# Patient Record
Sex: Female | Born: 1960 | Race: Black or African American | Hispanic: No | Marital: Married | Smoking: Former smoker
Health system: Southern US, Community
[De-identification: ages and names within clinical notes are randomized; demographics above are authoritative.]

## PROBLEM LIST (undated history)

## (undated) DIAGNOSIS — T7840XA Allergy, unspecified, initial encounter: Secondary | ICD-10-CM

## (undated) DIAGNOSIS — R7303 Prediabetes: Secondary | ICD-10-CM

## (undated) DIAGNOSIS — R5383 Other fatigue: Secondary | ICD-10-CM

## (undated) DIAGNOSIS — Z923 Personal history of irradiation: Secondary | ICD-10-CM

## (undated) DIAGNOSIS — Z789 Other specified health status: Secondary | ICD-10-CM

## (undated) DIAGNOSIS — M549 Dorsalgia, unspecified: Secondary | ICD-10-CM

## (undated) DIAGNOSIS — E119 Type 2 diabetes mellitus without complications: Secondary | ICD-10-CM

## (undated) DIAGNOSIS — E559 Vitamin D deficiency, unspecified: Secondary | ICD-10-CM

## (undated) DIAGNOSIS — C801 Malignant (primary) neoplasm, unspecified: Secondary | ICD-10-CM

## (undated) HISTORY — DX: Vitamin D deficiency, unspecified: E55.9

## (undated) HISTORY — DX: Prediabetes: R73.03

## (undated) HISTORY — DX: Allergy, unspecified, initial encounter: T78.40XA

## (undated) HISTORY — PX: POLYPECTOMY: SHX149

## (undated) HISTORY — DX: Dorsalgia, unspecified: M54.9

## (undated) HISTORY — DX: Type 2 diabetes mellitus without complications: E11.9

## (undated) HISTORY — PX: BREAST LUMPECTOMY: SHX2

## (undated) HISTORY — DX: Other fatigue: R53.83

## (undated) HISTORY — PX: WISDOM TOOTH EXTRACTION: SHX21

---
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
## 2001-09-22 ENCOUNTER — Other Ambulatory Visit: Admission: RE | Admit: 2001-09-22 | Discharge: 2001-09-22 | Payer: Self-pay | Admitting: Family Medicine

## 2002-09-27 ENCOUNTER — Encounter: Admission: RE | Admit: 2002-09-27 | Discharge: 2002-12-26 | Payer: Self-pay | Admitting: Family Medicine

## 2006-11-26 ENCOUNTER — Encounter: Admission: RE | Admit: 2006-11-26 | Discharge: 2006-11-26 | Payer: Self-pay | Admitting: Occupational Medicine

## 2007-01-05 ENCOUNTER — Encounter: Admission: RE | Admit: 2007-01-05 | Discharge: 2007-04-05 | Payer: Self-pay | Admitting: Occupational Medicine

## 2007-02-03 ENCOUNTER — Encounter: Admission: RE | Admit: 2007-02-03 | Discharge: 2007-02-03 | Payer: Self-pay | Admitting: Family Medicine

## 2007-02-09 ENCOUNTER — Other Ambulatory Visit: Admission: RE | Admit: 2007-02-09 | Discharge: 2007-02-09 | Payer: Self-pay | Admitting: Family Medicine

## 2008-02-07 ENCOUNTER — Encounter: Admission: RE | Admit: 2008-02-07 | Discharge: 2008-02-07 | Payer: Self-pay | Admitting: Internal Medicine

## 2008-02-11 ENCOUNTER — Other Ambulatory Visit: Admission: RE | Admit: 2008-02-11 | Discharge: 2008-02-11 | Payer: Self-pay | Admitting: Family Medicine

## 2009-03-01 ENCOUNTER — Encounter: Admission: RE | Admit: 2009-03-01 | Discharge: 2009-03-01 | Payer: Self-pay | Admitting: Internal Medicine

## 2009-03-22 ENCOUNTER — Other Ambulatory Visit: Admission: RE | Admit: 2009-03-22 | Discharge: 2009-03-22 | Payer: Self-pay | Admitting: Family Medicine

## 2009-11-06 ENCOUNTER — Encounter: Admission: RE | Admit: 2009-11-06 | Discharge: 2009-12-28 | Payer: Self-pay | Admitting: Occupational Medicine

## 2010-03-12 ENCOUNTER — Encounter: Admission: RE | Admit: 2010-03-12 | Discharge: 2010-03-12 | Payer: Self-pay | Admitting: Family Medicine

## 2010-03-20 ENCOUNTER — Encounter: Admission: RE | Admit: 2010-03-20 | Discharge: 2010-03-20 | Payer: Self-pay | Admitting: Family Medicine

## 2011-02-19 ENCOUNTER — Other Ambulatory Visit: Payer: Self-pay | Admitting: Internal Medicine

## 2011-02-19 DIAGNOSIS — Z1231 Encounter for screening mammogram for malignant neoplasm of breast: Secondary | ICD-10-CM

## 2011-03-21 ENCOUNTER — Ambulatory Visit
Admission: RE | Admit: 2011-03-21 | Discharge: 2011-03-21 | Disposition: A | Discharge status: Home / Self Care | Payer: Commercial Managed Care - PPO | Source: Ambulatory Visit | Attending: Internal Medicine | Admitting: Internal Medicine

## 2011-03-21 DIAGNOSIS — Z1231 Encounter for screening mammogram for malignant neoplasm of breast: Secondary | ICD-10-CM

## 2011-04-07 ENCOUNTER — Other Ambulatory Visit: Payer: Self-pay | Admitting: Physician Assistant

## 2011-04-07 ENCOUNTER — Other Ambulatory Visit (HOSPITAL_COMMUNITY)
Admission: RE | Admit: 2011-04-07 | Discharge: 2011-04-07 | Disposition: A | Discharge status: Home / Self Care | Payer: 59 | Source: Ambulatory Visit | Attending: Family Medicine | Admitting: Family Medicine

## 2011-04-07 DIAGNOSIS — Z01419 Encounter for gynecological examination (general) (routine) without abnormal findings: Secondary | ICD-10-CM | POA: Insufficient documentation

## 2012-04-06 ENCOUNTER — Other Ambulatory Visit: Payer: Self-pay | Admitting: Internal Medicine

## 2012-04-06 DIAGNOSIS — Z1231 Encounter for screening mammogram for malignant neoplasm of breast: Secondary | ICD-10-CM

## 2012-04-21 ENCOUNTER — Ambulatory Visit
Admission: RE | Admit: 2012-04-21 | Discharge: 2012-04-21 | Disposition: A | Discharge status: Home / Self Care | Payer: 59 | Source: Ambulatory Visit | Attending: Internal Medicine | Admitting: Internal Medicine

## 2012-04-21 DIAGNOSIS — Z1231 Encounter for screening mammogram for malignant neoplasm of breast: Secondary | ICD-10-CM

## 2013-03-31 ENCOUNTER — Other Ambulatory Visit: Payer: Self-pay

## 2013-03-31 DIAGNOSIS — Z1231 Encounter for screening mammogram for malignant neoplasm of breast: Secondary | ICD-10-CM

## 2013-04-22 ENCOUNTER — Ambulatory Visit: Payer: 59

## 2013-05-04 ENCOUNTER — Ambulatory Visit: Payer: 59

## 2013-05-05 ENCOUNTER — Ambulatory Visit
Admission: RE | Admit: 2013-05-05 | Discharge: 2013-05-05 | Disposition: A | Discharge status: Home / Self Care | Payer: 59 | Source: Ambulatory Visit

## 2013-05-05 DIAGNOSIS — Z1231 Encounter for screening mammogram for malignant neoplasm of breast: Secondary | ICD-10-CM

## 2013-10-10 ENCOUNTER — Other Ambulatory Visit (HOSPITAL_COMMUNITY)
Admission: RE | Admit: 2013-10-10 | Discharge: 2013-10-10 | Disposition: A | Discharge status: Home / Self Care | Payer: 59 | Source: Ambulatory Visit | Attending: Family Medicine | Admitting: Family Medicine

## 2013-10-10 ENCOUNTER — Other Ambulatory Visit: Payer: Self-pay | Admitting: Physician Assistant

## 2013-10-10 DIAGNOSIS — Z124 Encounter for screening for malignant neoplasm of cervix: Secondary | ICD-10-CM | POA: Insufficient documentation

## 2014-04-06 ENCOUNTER — Other Ambulatory Visit: Payer: Self-pay

## 2014-04-06 DIAGNOSIS — Z1231 Encounter for screening mammogram for malignant neoplasm of breast: Secondary | ICD-10-CM

## 2014-05-08 ENCOUNTER — Ambulatory Visit: Payer: 59

## 2014-05-15 ENCOUNTER — Encounter (INDEPENDENT_AMBULATORY_CARE_PROVIDER_SITE_OTHER): Payer: Self-pay

## 2014-05-15 ENCOUNTER — Ambulatory Visit
Admission: RE | Admit: 2014-05-15 | Discharge: 2014-05-15 | Disposition: A | Discharge status: Home / Self Care | Payer: 59 | Source: Ambulatory Visit

## 2014-05-15 DIAGNOSIS — Z1231 Encounter for screening mammogram for malignant neoplasm of breast: Secondary | ICD-10-CM

## 2014-06-04 ENCOUNTER — Ambulatory Visit (INDEPENDENT_AMBULATORY_CARE_PROVIDER_SITE_OTHER): Payer: 59 | Admitting: Physician Assistant

## 2014-06-04 VITALS — BP 114/80 | HR 82 | Temp 98.1°F | Resp 18 | Ht 63.25 in | Wt 187.0 lb

## 2014-06-04 DIAGNOSIS — R05 Cough: Secondary | ICD-10-CM

## 2014-06-04 DIAGNOSIS — J40 Bronchitis, not specified as acute or chronic: Secondary | ICD-10-CM

## 2014-06-04 DIAGNOSIS — R059 Cough, unspecified: Secondary | ICD-10-CM

## 2014-06-04 DIAGNOSIS — J029 Acute pharyngitis, unspecified: Secondary | ICD-10-CM

## 2014-06-04 LAB — POCT RAPID STREP A (OFFICE): RAPID STREP A SCREEN: NEGATIVE

## 2014-06-04 MED ORDER — HYDROCOD POLST-CHLORPHEN POLST 10-8 MG/5ML PO LQCR: 5.0000 mL | Freq: Two times a day (BID) | ORAL | Status: DC | PRN

## 2014-06-04 MED ORDER — AZITHROMYCIN 250 MG PO TABS: ORAL_TABLET | ORAL | Status: DC

## 2014-06-04 NOTE — Progress Notes (Signed)
Subjective:    Patient ID: Ashley Clark, female    DOB: 18-Mar-1961, 53 y.o.   MRN: [id number]  HPI Primary Physician: No primary provider on file.  Chief Complaint: Cough x 5 days and sore throat x 2 days  HPI: 53 y.o. female with history below presents with 5 day history of cough and sore throat x 2 days. Some post nasal drip. Cough is productive of light yellow sputum and keeping the patient awake at nighttime. No SOB or wheezing. Afebrile. No chills. History of seasonal allergies.   No known sick contacts.  She has tried Allegra-D, but none recently.  Smokes 1 pack every 3 days. Been ready to quit. Has tried cold [place] without success.    No past medical history on file.   Home Meds: Prior to Admission medications   Not on File    Allergies: No Known Allergies  History   Social History  . Marital Status: Married    Spouse Name: N/A    Number of Children: N/A  . Years of Education: N/A   Occupational History  . Not on file.   Social History Main Topics  . Smoking status: Current Every Day Smoker  . Smokeless tobacco: Not on file  . Alcohol Use: Not on file  . Drug Use: Not on file  . Sexual Activity: Not on file   Other Topics Concern  . Not on file   Social History Narrative  . No narrative on file     Review of Systems  Constitutional: Negative for fever, chills, appetite change and fatigue.  HENT: Positive for postnasal drip and sore throat. Negative for congestion, ear pain, hearing loss, rhinorrhea and sinus pressure.        Left ear wants to pop  Respiratory: Positive for cough. Negative for shortness of breath and wheezing.        Cough is sometimes productive of light yellow sputum. Cough is keeping her awake at nighttime.   Gastrointestinal: Negative for nausea, vomiting and diarrhea.  Musculoskeletal: Negative for myalgias.  Allergic/Immunologic: Positive for environmental allergies.       Seasonal allergies.   Neurological: Negative for  headaches.       Objective:   Physical Exam  Physical Exam: Blood pressure 114/80, pulse 82, temperature 98.1 F (36.7 C), temperature source Oral, resp. rate 18, height 5' 3.25" (1.607 m), weight 187 lb (84.823 kg), last menstrual period 04/27/2014, SpO2 96.00%., Body mass index is 32.85 kg/(m^2). General: Well developed, well nourished, in no acute distress. Head: Normocephalic, atraumatic, eyes without discharge, sclera non-icteric, nares are without discharge. Bilateral auditory canals clear, TM's are without perforation, pearly grey and translucent with reflective cone of light bilaterally. Oral cavity moist, posterior pharynx with post nasal drip. No exudate, erythema, or peritonsillar abscess. Uvula midline.   Neck: Supple. No thyromegaly. Full ROM. No lymphadenopathy. No nuchal rigidity.  Lungs: Clear bilaterally to auscultation without wheezes, rales, or rhonchi. Breathing is unlabored. Heart: RRR with S1 S2. No murmurs, rubs, or gallops appreciated. Msk:  Strength and tone normal for age. Extremities/Skin: Warm and dry. No clubbing or cyanosis. No edema. No rashes or suspicious lesions. Neuro: Alert and oriented X 3. Moves all extremities spontaneously. Gait is normal. CNII-XII grossly in tact. Psych:  Responds to questions appropriately with a normal affect.    Labs: Results for orders placed in visit on 06/04/14  POCT RAPID STREP A (OFFICE)      Result Value Ref Range   Rapid  Strep A Screen Negative  Negative   Throat culture pending    Assessment & Plan:  53 year old female with bronchitis and cough -Azithromycin 250 MG #6 2 po first day then 1 po next 4 days no RF -Tussionex 1 tsp po q 12 hours prn cough #90 mL no RF, SED -Rest/fluids -RTC precautions   Christell Faith, MHS, PA-C [facility] [place], [place] [address] [place] 06/04/2014 9:16 AM

## 2014-06-06 LAB — CULTURE, GROUP A STREP: ORGANISM ID, BACTERIA: NORMAL

## 2014-11-10 ENCOUNTER — Other Ambulatory Visit: Payer: Self-pay | Admitting: Physician Assistant

## 2014-11-10 ENCOUNTER — Other Ambulatory Visit (HOSPITAL_COMMUNITY)
Admission: RE | Admit: 2014-11-10 | Discharge: 2014-11-10 | Disposition: A | Discharge status: Home / Self Care | Payer: 59 | Source: Ambulatory Visit | Attending: Family Medicine | Admitting: Family Medicine

## 2014-11-10 DIAGNOSIS — Z01419 Encounter for gynecological examination (general) (routine) without abnormal findings: Secondary | ICD-10-CM | POA: Insufficient documentation

## 2014-11-14 LAB — CYTOLOGY - PAP

## 2014-12-29 HISTORY — PX: COLONOSCOPY: SHX174

## 2015-01-23 ENCOUNTER — Encounter: Payer: Self-pay | Admitting: Gastroenterology

## 2015-03-26 ENCOUNTER — Ambulatory Visit (AMBULATORY_SURGERY_CENTER): Payer: Self-pay | Admitting: *Deleted

## 2015-03-26 VITALS — Ht 61.5 in | Wt 168.2 lb

## 2015-03-26 DIAGNOSIS — Z1211 Encounter for screening for malignant neoplasm of colon: Secondary | ICD-10-CM

## 2015-03-26 MED ORDER — MOVIPREP 100 G PO SOLR: 1.0000 | Freq: Once | ORAL | Status: DC

## 2015-03-26 NOTE — Progress Notes (Signed)
No egg or soy allergy No past sedation. No diet pills  no home 02 use emmi video declined

## 2015-04-06 ENCOUNTER — Encounter: Payer: Self-pay | Admitting: Gastroenterology

## 2015-04-06 ENCOUNTER — Ambulatory Visit (AMBULATORY_SURGERY_CENTER): Payer: 59 | Admitting: Gastroenterology

## 2015-04-06 VITALS — BP 128/76 | HR 65 | Temp 98.3°F | Resp 18 | Ht 61.5 in | Wt 168.0 lb

## 2015-04-06 DIAGNOSIS — Z1211 Encounter for screening for malignant neoplasm of colon: Secondary | ICD-10-CM | POA: Diagnosis not present

## 2015-04-06 DIAGNOSIS — D122 Benign neoplasm of ascending colon: Secondary | ICD-10-CM | POA: Diagnosis not present

## 2015-04-06 MED ORDER — SODIUM CHLORIDE 0.9 % IV SOLN: 500.0000 mL | INTRAVENOUS | Status: DC

## 2015-04-06 NOTE — Patient Instructions (Signed)
Impressions/recommendations:  Polyp (handout given)  Repeat colonoscopy pending pathology results.  YOU HAD AN ENDOSCOPIC PROCEDURE TODAY AT THE  ENDOSCOPY CENTER:   Refer to the procedure report that was given to you for any specific questions about what was found during the examination.  If the procedure report does not answer your questions, please call your gastroenterologist to clarify.  If you requested that your care partner not be given the details of your procedure findings, then the procedure report has been included in a sealed envelope for you to review at your convenience later.  YOU SHOULD EXPECT: Some feelings of bloating in the abdomen. Passage of more gas than usual.  Walking can help get rid of the air that was put into your GI tract during the procedure and reduce the bloating. If you had a lower endoscopy (such as a colonoscopy or flexible sigmoidoscopy) you may notice spotting of blood in your stool or on the toilet paper. If you underwent a bowel prep for your procedure, you may not have a normal bowel movement for a few days.  Please Note:  You might notice some irritation and congestion in your nose or some drainage.  This is from the oxygen used during your procedure.  There is no need for concern and it should clear up in a day or so.  SYMPTOMS TO REPORT IMMEDIATELY:   Following lower endoscopy (colonoscopy or flexible sigmoidoscopy):  Excessive amounts of blood in the stool  Significant tenderness or worsening of abdominal pains  Swelling of the abdomen that is new, acute  Fever of 100F or higher   For urgent or emergent issues, a gastroenterologist can be reached at any hour by calling [phone].   DIET: Your first meal following the procedure should be a small meal and then it is ok to progress to your normal diet. Heavy or fried foods are harder to digest and may make you feel nauseous or bloated.  Likewise, meals heavy in dairy and vegetables can  increase bloating.  Drink plenty of fluids but you should avoid alcoholic beverages for 24 hours.  ACTIVITY:  You should plan to take it easy for the rest of today and you should NOT DRIVE or use heavy machinery until tomorrow (because of the sedation medicines used during the test).    FOLLOW UP: Our staff will call the number listed on your records the next business day following your procedure to check on you and address any questions or concerns that you may have regarding the information given to you following your procedure. If we do not reach you, we will leave a message.  However, if you are feeling well and you are not experiencing any problems, there is no need to return our call.  We will assume that you have returned to your regular daily activities without incident.  If any biopsies were taken you will be contacted by phone or by letter within the next 1-3 weeks.  Please call us at [phone] if you have not heard about the biopsies in 3 weeks.    SIGNATURES/CONFIDENTIALITY: You and/or your care partner have signed paperwork which will be entered into your electronic medical record.  These signatures attest to the fact that that the information above on your After Visit Summary has been reviewed and is understood.  Full responsibility of the confidentiality of this discharge information lies with you and/or your care-partner. 

## 2015-04-06 NOTE — Progress Notes (Signed)
Called to room to assist during endoscopic procedure.  Patient ID and intended procedure confirmed with present staff. Received instructions for my participation in the procedure from the performing physician.  

## 2015-04-06 NOTE — Op Note (Signed)
[place]  [address] [place], [id number]   COLONOSCOPY PROCEDURE REPORT  PATIENT: Ashley Clark, Ashley Clark  MR#: [id number] BIRTHDATE: 1961-01-18 , 66  yrs. old GENDER: female ENDOSCOPIST: Milus Banister, MD REFERRED FX:[id number] [name], P.A. PROCEDURE DATE:  04/06/2015 PROCEDURE:   Colonoscopy, screening and Colonoscopy with snare polypectomy First Screening Colonoscopy - Avg.  risk and is 50 yrs.  old or older Yes.  Prior Negative Screening - Now for repeat screening. N/A  History of Adenoma - Now for follow-up colonoscopy & has been > or = to 3 yrs.  N/A ASA CLASS:   Class II INDICATIONS:Screening for colonic neoplasia and Colorectal Neoplasm Risk Assessment for this procedure is average risk. MEDICATIONS: Monitored anesthesia care and Propofol 250 mg IV  DESCRIPTION OF PROCEDURE:   After the risks benefits and alternatives of the procedure were thoroughly explained, informed consent was obtained.  The digital rectal exam revealed no abnormalities of the rectum.   The LB [id number]  endoscope was introduced through the anus and advanced to the cecum, which was identified by both the appendix and ileocecal valve. No adverse events experienced.   The quality of the prep was excellent.  The instrument was then slowly withdrawn as the colon was fully examined.   COLON FINDINGS: A sessile polyp measuring 8 mm in size was found in the ascending colon.  A polypectomy was performed using snare cautery.  The resection was complete, the polyp tissue was completely retrieved and sent to histology.  Retroflexed views revealed no abnormalities. The time to cecum = 1.5 Withdrawal time = 9.7   The scope was withdrawn and the procedure completed. COMPLICATIONS: There were no immediate complications.  ENDOSCOPIC IMPRESSION: Sessile polyp was found in the ascending colon; polypectomy was performed using snare cautery  RECOMMENDATIONS: If the polyp(s) removed today  are proven to be adenomatous (pre-cancerous) polyps, you will need a repeat colonoscopy in 5 years.  Otherwise you should continue to follow colorectal cancer screening guidelines for "routine risk" patients with colonoscopy in 10 years.  You will receive a letter within 1-2 weeks with the results of your biopsy as well as final recommendations.  Please call my office if you have not received a letter after 3 weeks.  eSigned:  Milus Banister, MD 04/06/2015 10:38 AM

## 2015-04-06 NOTE — Progress Notes (Signed)
A/ox3 pleased with MAC, report to Tracy RN 

## 2015-04-09 ENCOUNTER — Telehealth: Payer: Self-pay | Admitting: *Deleted

## 2015-04-09 NOTE — Telephone Encounter (Signed)
  Follow up Call-  Call back number 04/06/2015  Post procedure Call Back phone  # [phone]  Permission to leave phone message Yes   [facility]

## 2015-04-11 ENCOUNTER — Other Ambulatory Visit: Payer: Self-pay

## 2015-04-11 DIAGNOSIS — Z1231 Encounter for screening mammogram for malignant neoplasm of breast: Secondary | ICD-10-CM

## 2015-04-13 ENCOUNTER — Encounter: Payer: Self-pay | Admitting: Gastroenterology

## 2015-05-18 ENCOUNTER — Encounter (INDEPENDENT_AMBULATORY_CARE_PROVIDER_SITE_OTHER): Payer: Self-pay

## 2015-05-18 ENCOUNTER — Ambulatory Visit
Admission: RE | Admit: 2015-05-18 | Discharge: 2015-05-18 | Disposition: A | Discharge status: Home / Self Care | Payer: 59 | Source: Ambulatory Visit

## 2015-05-18 DIAGNOSIS — Z1231 Encounter for screening mammogram for malignant neoplasm of breast: Secondary | ICD-10-CM

## 2016-01-24 DIAGNOSIS — M545 Low back pain: Secondary | ICD-10-CM | POA: Diagnosis not present

## 2016-01-24 DIAGNOSIS — M25531 Pain in right wrist: Secondary | ICD-10-CM | POA: Diagnosis not present

## 2016-01-24 DIAGNOSIS — M9902 Segmental and somatic dysfunction of thoracic region: Secondary | ICD-10-CM | POA: Diagnosis not present

## 2016-01-24 DIAGNOSIS — M9901 Segmental and somatic dysfunction of cervical region: Secondary | ICD-10-CM | POA: Diagnosis not present

## 2016-02-11 MED FILL — MUCINEX ER 600 MG TABLET: 600 | 15 days supply | Qty: 30 | Fill #0

## 2016-02-11 MED FILL — FLUTICASONE PROP 50 MCG SPR: 50 | 30 days supply | Qty: 16 | Fill #0

## 2016-05-01 ENCOUNTER — Other Ambulatory Visit: Payer: Self-pay

## 2016-05-01 DIAGNOSIS — Z1231 Encounter for screening mammogram for malignant neoplasm of breast: Secondary | ICD-10-CM

## 2016-05-12 DIAGNOSIS — Z720 Tobacco use: Secondary | ICD-10-CM | POA: Diagnosis not present

## 2016-05-12 DIAGNOSIS — Z87891 Personal history of nicotine dependence: Secondary | ICD-10-CM | POA: Diagnosis not present

## 2016-05-19 ENCOUNTER — Ambulatory Visit
Admission: RE | Admit: 2016-05-19 | Discharge: 2016-05-19 | Disposition: A | Discharge status: Home / Self Care | Payer: 59 | Source: Ambulatory Visit

## 2016-05-19 DIAGNOSIS — Z1231 Encounter for screening mammogram for malignant neoplasm of breast: Secondary | ICD-10-CM | POA: Diagnosis not present

## 2016-05-22 ENCOUNTER — Other Ambulatory Visit (HOSPITAL_COMMUNITY)
Admission: RE | Admit: 2016-05-22 | Discharge: 2016-05-22 | Disposition: A | Discharge status: Home / Self Care | Payer: 59 | Source: Ambulatory Visit | Attending: Family Medicine | Admitting: Family Medicine

## 2016-05-22 ENCOUNTER — Other Ambulatory Visit: Payer: Self-pay | Admitting: Physician Assistant

## 2016-05-22 DIAGNOSIS — Z124 Encounter for screening for malignant neoplasm of cervix: Secondary | ICD-10-CM | POA: Diagnosis not present

## 2016-05-22 DIAGNOSIS — Z23 Encounter for immunization: Secondary | ICD-10-CM | POA: Diagnosis not present

## 2016-05-22 DIAGNOSIS — Z0000 Encounter for general adult medical examination without abnormal findings: Secondary | ICD-10-CM | POA: Diagnosis not present

## 2016-05-27 LAB — CYTOLOGY - PAP

## 2016-06-30 DIAGNOSIS — L239 Allergic contact dermatitis, unspecified cause: Secondary | ICD-10-CM | POA: Diagnosis not present

## 2017-03-12 DIAGNOSIS — H524 Presbyopia: Secondary | ICD-10-CM | POA: Diagnosis not present

## 2017-05-26 DIAGNOSIS — Z131 Encounter for screening for diabetes mellitus: Secondary | ICD-10-CM | POA: Diagnosis not present

## 2017-05-26 DIAGNOSIS — Z0000 Encounter for general adult medical examination without abnormal findings: Secondary | ICD-10-CM | POA: Diagnosis not present

## 2017-05-26 DIAGNOSIS — Z13220 Encounter for screening for lipoid disorders: Secondary | ICD-10-CM | POA: Diagnosis not present

## 2017-06-18 ENCOUNTER — Other Ambulatory Visit: Payer: Self-pay | Admitting: Physician Assistant

## 2017-06-18 DIAGNOSIS — Z1231 Encounter for screening mammogram for malignant neoplasm of breast: Secondary | ICD-10-CM

## 2017-07-07 ENCOUNTER — Ambulatory Visit: Payer: 59

## 2017-07-22 ENCOUNTER — Ambulatory Visit
Admission: RE | Admit: 2017-07-22 | Discharge: 2017-07-22 | Disposition: A | Discharge status: Home / Self Care | Payer: 59 | Source: Ambulatory Visit | Attending: Physician Assistant | Admitting: Physician Assistant

## 2017-07-22 DIAGNOSIS — Z1231 Encounter for screening mammogram for malignant neoplasm of breast: Secondary | ICD-10-CM

## 2017-12-29 DIAGNOSIS — C801 Malignant (primary) neoplasm, unspecified: Secondary | ICD-10-CM

## 2017-12-29 HISTORY — DX: Malignant (primary) neoplasm, unspecified: C80.1

## 2018-03-23 ENCOUNTER — Encounter: Payer: Self-pay | Admitting: Gastroenterology

## 2018-04-09 MED FILL — FLUTICASONE PROP 50 MCG SPR: 50 | 30 days supply | Qty: 16 | Fill #0

## 2018-04-09 MED FILL — CETIRIZINE HCL 10 MG TABS: 10 | 30 days supply | Qty: 30 | Fill #0

## 2018-06-02 ENCOUNTER — Encounter: Payer: Self-pay | Admitting: Gastroenterology

## 2018-06-15 ENCOUNTER — Other Ambulatory Visit: Payer: Self-pay | Admitting: Physician Assistant

## 2018-06-15 DIAGNOSIS — Z1231 Encounter for screening mammogram for malignant neoplasm of breast: Secondary | ICD-10-CM

## 2018-07-27 ENCOUNTER — Ambulatory Visit
Admission: RE | Admit: 2018-07-27 | Discharge: 2018-07-27 | Disposition: A | Discharge status: Home / Self Care | Payer: No Typology Code available for payment source | Source: Ambulatory Visit | Attending: Physician Assistant | Admitting: Physician Assistant

## 2018-07-27 DIAGNOSIS — Z1231 Encounter for screening mammogram for malignant neoplasm of breast: Secondary | ICD-10-CM

## 2018-07-28 ENCOUNTER — Other Ambulatory Visit: Payer: Self-pay | Admitting: Physician Assistant

## 2018-07-28 DIAGNOSIS — R928 Other abnormal and inconclusive findings on diagnostic imaging of breast: Secondary | ICD-10-CM

## 2018-08-02 ENCOUNTER — Other Ambulatory Visit: Payer: Self-pay | Admitting: Physician Assistant

## 2018-08-02 ENCOUNTER — Ambulatory Visit
Admission: RE | Admit: 2018-08-02 | Discharge: 2018-08-02 | Disposition: A | Discharge status: Home / Self Care | Payer: No Typology Code available for payment source | Source: Ambulatory Visit | Attending: Physician Assistant | Admitting: Physician Assistant

## 2018-08-02 DIAGNOSIS — R928 Other abnormal and inconclusive findings on diagnostic imaging of breast: Secondary | ICD-10-CM

## 2018-08-02 DIAGNOSIS — R599 Enlarged lymph nodes, unspecified: Secondary | ICD-10-CM

## 2018-08-02 DIAGNOSIS — N6320 Unspecified lump in the left breast, unspecified quadrant: Secondary | ICD-10-CM

## 2018-08-04 ENCOUNTER — Other Ambulatory Visit: Payer: Self-pay | Admitting: Physician Assistant

## 2018-08-04 ENCOUNTER — Ambulatory Visit
Admission: RE | Admit: 2018-08-04 | Discharge: 2018-08-04 | Disposition: A | Discharge status: Home / Self Care | Payer: No Typology Code available for payment source | Source: Ambulatory Visit | Attending: Physician Assistant | Admitting: Physician Assistant

## 2018-08-04 DIAGNOSIS — R599 Enlarged lymph nodes, unspecified: Secondary | ICD-10-CM

## 2018-08-04 DIAGNOSIS — N6320 Unspecified lump in the left breast, unspecified quadrant: Secondary | ICD-10-CM

## 2018-08-04 HISTORY — PX: BREAST BIOPSY: SHX20

## 2018-08-09 ENCOUNTER — Ambulatory Visit: Payer: Self-pay | Admitting: Surgery

## 2018-08-09 NOTE — H&P (Signed)
Ashley Clark Documented: 08/09/2018 9:28 AM Location: [facility] Patient #: [id number] DOB: 03/25/1961 Married / Language: English / Race: Black or African American Female  History of Present Illness Marcello Moores A. Anesa Fronek MD; 08/09/2018 11:12 AM) Patient words: Pt sent at the request of Dr Jeanmarie Plant for abnormal screening mammogram. Pt found to have 2 adjacent masses 1 cm and 2 cm from nipple lower outer and inner quadrant. No history of pain discharge or mass.                             ADDITIONAL INFORMATION: 1. PROGNOSTIC INDICATORS Results: IMMUNOHISTOCHEMICAL AND MORPHOMETRIC ANALYSIS PERFORMED MANUALLY The tumor cells are Neagitive for Her2 (1+). Estrogen Receptor: 90%, POSITIVE, STRONG STAINING INTENSITY Progesterone Receptor: 95%, POSITIVE, STRONG STAINING INTENSITY Proliferation Marker Ki67: 20% REFERENCE RANGE ESTROGEN RECEPTOR NEGATIVE 0% POSITIVE =>1% REFERENCE RANGE PROGESTERONE RECEPTOR NEGATIVE 0% POSITIVE =>1% All controls stained appropriately Vicente Males MD Pathologist, Electronic Signature ( Signed 08/06/2018) FINAL DIAGNOSIS Diagnosis 1. 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 2. Breast, left, needle core biopsy, 9:30 o'clock, 1 cm fn (coil clip) - PSEUDOANGIOMATOUS STROMAL HYPERPLASIA ([place]). 1 of 3 FINAL for Ashley Clark, Ashley Clark ([id number]) Diagnosis(continued) - FIBROCYSTIC CHANGE AND CALCIFICATIONS. - NO MALIGNANCY IDENTIFIED. 3. Lymph node, needle/core biopsy, left axilla - BENIGN LYMPH NODE WITH DERMATOPATHIC CHANGE. - NO MALIGNANCY IDENTIFIED. Microscopic Comment 1. The carcinoma appears grade 2-3. Prognostic markers will be ordered. Dr. Lyndon Code has reviewed the case. The case was called to [facility] on 08/05/2018. Vicente Males MD Pathologist, Electronic Signature (Case signed  08/05/2018)    ADDENDUM REPORT: 08/04/2018 15:56 ADDENDUM: Correction to the laterality in the body of the original report: all imaging physical exam findings are in the left breast. Electronically Signed By: Ammie Ferrier M.D. On: 08/04/2018 15:56 Addended by Jamie Kato, MD on 08/04/2018 3:59 PM  Study Result CLINICAL DATA: Screening recall for a left breast possible mass. Patient has family history of breast cancer in her mother at age 73. EXAM: DIGITAL DIAGNOSTIC LEFT MAMMOGRAM WITH CAD AND TOMO ULTRASOUND LEFT BREAST COMPARISON: Previous exam(s). ACR Breast Density Category c: The breast tissue is heterogeneously dense, which may obscure small masses. FINDINGS: On the spot compression tomosynthesis images of the medial left breast there is a persistent subtle asymmetry without a correlate on the true lateral view. The abnormality appears more apparent on the screening full paddle CC image and appears to have some associated distortion. Mammographic images were processed with CAD. On physical exam, there is a subtle palpable lump in the upper inner quadrant of the right breast. Ultrasound of the right breast at 9:30, 2 cm from the nipple demonstrates an irregular hypoechoic vascular mass measuring 1.2 x 1.2 x 1.1 cm. Closer to the nipple at 9:30, 1 cm from the nipple there is a smaller irregular hypoechoic mass measuring 0.8 x 0.5 x 0.6 cm. The larger and the smaller mass together span 3.6 cm, and they lie approximately 1.5 cm apart. Ultrasound of the left axilla demonstrates 1 lymph node with a cortex measuring up to 6 mm. There are 2 other prominent lymph nodes with cortices measuring approximately 4 mm. IMPRESSION: 1. There are 2 adjacent masses in the left breast at 9:30, 1 and 2 cm from the nipple respectively which are suspicious for malignancy. 2. There is 1 lymph node  with cortex measuring 6 mm, and 2 other the borderline possibly abnormal lymph  nodes. RECOMMENDATION: Ultrasound guided biopsy is recommended for the 2 adjacent masses in the left breast at 9:30 and for the largest of the left axillary lymph nodes. 2. Following biopsy, MRI should be considered given the possible multifocality and the density of the patient's breast tissue. These procedures have been scheduled for 08/04/2018 at 1:45 p.m. I have discussed the findings and recommendations with the patient. Results were also provided in writing at the conclusion of the visit. If applicable, a reminder letter will be sent to the patient regarding the next appointment. BI-RADS CATEGORY 5: Highly suggestive of malignancy. Electronically Signed: By: Ammie Ferrier M.D. On: 08/02/2018 09:52  Result History  MM DIAG BREAST TOMO UNI LEFT (Order #[id number]) on 08/04/2018 - Order Result History Report - Result Edited <[email or web address]  MM 3D SCREEN BREAST BILATERAL (Order [id number]) Study Result  CLINICAL DATA: Screening. EXAM: DIGITAL SCREENING BILATERAL MAMMOGRAM WITH TOMO AND CAD COMPARISON: Previous exam(s). ACR Breast Density Category c: The breast tissue is heterogeneously dense, which may obscure small masses. FINDINGS: In the left breast, a possible mass warrants further evaluation. In the right breast, no findings suspicious for malignancy. Images were processed with CAD. IMPRESSION: Further evaluation is suggested for possible mass in the left breast. RECOMMENDATION: Diagnostic mammogram and possibly ultrasound of the left breast. (Code:[id number]) The patient will be contacted regarding the findings, and additional imaging will be scheduled. BI-RADS CATEGORY 0: Incomplete. Need additional imaging evaluation and/or prior mammograms for comparison.    Specimen Gross and Clinical Information.  The patient is a 57 year old female.   Past Surgical History Sabino Gasser; 08/09/2018 9:37 AM) No pertinent past surgical history  Diagnostic  Studies History Sabino Gasser; 08/09/2018 9:37 AM) Colonoscopy 1-5 years ago Mammogram within last year Pap Smear 1-5 years ago  Allergies Sabino Gasser; 08/09/2018 9:37 AM) No Known Drug Allergies [08/09/2018]: Allergies Reconciled  Medication History Sabino Gasser; 08/09/2018 9:38 AM) Allergia-C (12.5MG/5ML Elixir, Oral) Active. Medications Reconciled  Social History Sabino Gasser; 08/09/2018 9:37 AM) Alcohol use Occasional alcohol use. Caffeine use Coffee. No drug use Tobacco use Former smoker.  Family History Sabino Gasser; 08/09/2018 9:37 AM) Breast Cancer Mother. Diabetes Mellitus Family Members In General.  Pregnancy / Birth History Sabino Gasser; 08/09/2018 9:37 AM) Age at menarche 9 years. Age of menopause 51-55 Contraceptive History Oral contraceptives. Gravida 1 Irregular periods Maternal age 27-20 Para 1     Review of Systems (Knoah Nedeau A. Wenonah Milo MD; 08/09/2018 11:14 AM) General Not Present- Appetite Loss, Chills, Fatigue, Fever, Night Sweats, Weight Gain and Weight Loss. Skin Not Present- Change in Wart/Mole, Dryness, Hives, Jaundice, New Lesions, Non-Healing Wounds, Rash and Ulcer. HEENT Not Present- Earache, Hearing Loss, Hoarseness, Nose Bleed, Oral Ulcers, Ringing in the Ears, Seasonal Allergies, Sinus Pain, Sore Throat, Visual Disturbances, Wears glasses/contact lenses and Yellow Eyes. Respiratory Not Present- Bloody sputum, Chronic Cough, Difficulty Breathing, Snoring and Wheezing. Breast Not Present- Breast Mass, Breast Pain, Nipple Discharge and Skin Changes. Cardiovascular Not Present- Chest Pain, Difficulty Breathing Lying Down, Leg Cramps, Palpitations, Rapid Heart Rate, Shortness of Breath and Swelling of Extremities. Gastrointestinal Not Present- Abdominal Pain, Bloating, Bloody Stool, Change in Bowel Habits, Chronic diarrhea, Constipation, Difficulty Swallowing, Excessive gas, Gets full quickly at meals, Hemorrhoids, Indigestion,  Nausea, Rectal Pain and Vomiting. Female Genitourinary Not Present- Frequency, Nocturia, Painful Urination, Pelvic Pain and Urgency. Musculoskeletal Not Present- Back Pain, Joint Pain, Joint Stiffness, Muscle Pain, Muscle Weakness and Swelling of  Extremities. Neurological Not Present- Decreased Memory, Fainting, Headaches, Numbness, Seizures, Tingling, Tremor, Trouble walking and Weakness. Psychiatric Not Present- Anxiety, Bipolar, Change in Sleep Pattern, Depression, Fearful and Frequent crying. Endocrine Not Present- Cold Intolerance, Excessive Hunger, Hair Changes, Heat Intolerance, Hot flashes and New Diabetes. Hematology Not Present- Blood Thinners, Easy Bruising, Excessive bleeding, Gland problems, HIV and Persistent Infections. All other systems negative  Vitals Sabino Gasser; 08/09/2018 9:39 AM) 08/09/2018 9:38 AM Weight: 206.25 lb Height: 61in Body Surface Area: 1.91 m Body Mass Index: 38.97 kg/m  Temp.: 98.78F(Oral)  Pulse: 80 (Regular)  BP: 128/82 (Sitting, Left Arm, Standard)      Physical Exam (Eriyanna Kofoed A. Atalia Litzinger MD; 08/09/2018 11:13 AM)  General Mental Status-Alert. General Appearance-Consistent with stated age. Hydration-Well hydrated. Voice-Normal.  Head and Neck Head-normocephalic, atraumatic with no lesions or palpable masses. Trachea-midline. Thyroid Gland Characteristics - normal size and consistency.  Eye Eyeball - Bilateral-Extraocular movements intact. Sclera/Conjunctiva - Bilateral-No scleral icterus.  Chest and Lung Exam Chest and lung exam reveals -quiet, even and easy respiratory effort with no use of accessory muscles and on auscultation, normal breath sounds, no adventitious sounds and normal vocal resonance. Inspection Chest Wall - Normal. Back - normal.  Breast Breast - Left-Symmetric, Non Tender, No Biopsy scars, no Dimpling, No Inflammation, No Lumpectomy scars, No Mastectomy scars, No Peau d' Orange. Breast  - Right-Symmetric, Non Tender, No Biopsy scars, no Dimpling, No Inflammation, No Lumpectomy scars, No Mastectomy scars, No Peau d' Orange. Breast Lump-No Palpable Breast Mass.  Cardiovascular Cardiovascular examination reveals -normal heart sounds, regular rate and rhythm with no murmurs and normal pedal pulses bilaterally.  Musculoskeletal Normal Exam - Left-Upper Extremity Strength Normal and Lower Extremity Strength Normal. Normal Exam - Right-Upper Extremity Strength Normal and Lower Extremity Strength Normal.  Lymphatic Head & Neck  General Head & Neck Lymphatics: Bilateral - Description - Normal. Axillary  General Axillary Region: Bilateral - Description - Normal. Tenderness - Non Tender.    Assessment & Plan (Honour Schwieger A. 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  You should hear from our office's scheduling department within 5 working days about the location, date, and time of surgery. We try to make accommodations for patient's preferences in scheduling surgery, but sometimes the OR schedule or the surgeon's schedule prevents [place] from making those accommodations.  If you have not heard from our office [phone]) in 5 working days, call the office and ask for your surgeon's nurse.  If you have other questions about your diagnosis, plan, or surgery, call the office and ask for your surgeon's nurse.  Pt Education - [facility] Breast Cancer Information Given - [facility] "Breast Journey" Package We discussed the staging and pathophysiology of breast cancer. We discussed  all of the different options for treatment for breast cancer including surgery, chemotherapy, radiation therapy, Herceptin, and antiestrogen therapy. We discussed a sentinel lymph node biopsy as she does not appear to having lymph node involvement right now. We discussed the performance of that with injection of radioactive tracer and blue dye. We discussed that she would have an incision underneath her axillary hairline. We discussed that there is a bout a 10-20%  chance of having a positive node with a sentinel lymph node biopsy and we will await the permanent pathology to make any other first further decisions in terms of her treatment. One of these options might be to return to the operating room to perform an axillary lymph node dissection. We discussed about a 1-2% risk lifetime of chronic shoulder pain as well as lymphedema associated with a sentinel lymph node biopsy. We discussed the options for treatment of the breast cancer which included lumpectomy versus a mastectomy. We discussed the performance of the lumpectomy with a wire placement. We discussed a 10-20% chance of a positive margin requiring reexcision in the operating room. We also discussed that she may need radiation therapy or antiestrogen therapy or both if she undergoes lumpectomy. We discussed the mastectomy and the postoperative care for that as well. We discussed that there is no difference in her survival whether she undergoes lumpectomy with radiation therapy or antiestrogen therapy versus a mastectomy. There is a slight difference in the local recurrence rate being 3-5% with lumpectomy and about 1% with a mastectomy. We discussed the risks of operation including bleeding, infection, possible reoperation. She understands her further therapy will be based on what her stages at the time of her operation.  Pt Education - [facility]b breast cancer surgery: discussed with patient and provided information. Pt Education - [facility] Mastectomy HCI Pt  Education - [facility] Breast Biopsy HCI: discussed with patient and provided information. Pt Education - ABC (After Breast Cancer) Class Info: discussed with patient and provided information.

## 2018-08-11 ENCOUNTER — Encounter: Payer: Self-pay | Admitting: Hematology and Oncology

## 2018-08-12 ENCOUNTER — Other Ambulatory Visit: Payer: Self-pay | Admitting: Surgery

## 2018-08-12 DIAGNOSIS — C50411 Malignant neoplasm of upper-outer quadrant of right female breast: Secondary | ICD-10-CM

## 2018-08-12 DIAGNOSIS — Z171 Estrogen receptor negative status [ER-]: Principal | ICD-10-CM

## 2018-08-12 DIAGNOSIS — C50412 Malignant neoplasm of upper-outer quadrant of left female breast: Secondary | ICD-10-CM

## 2018-08-13 ENCOUNTER — Encounter: Payer: Self-pay | Admitting: Radiation Oncology

## 2018-08-18 ENCOUNTER — Other Ambulatory Visit: Payer: Self-pay

## 2018-08-18 ENCOUNTER — Encounter: Payer: Self-pay | Admitting: Radiation Oncology

## 2018-08-18 ENCOUNTER — Inpatient Hospital Stay
Payer: No Typology Code available for payment source | Attending: Hematology and Oncology | Admitting: Hematology and Oncology

## 2018-08-18 ENCOUNTER — Ambulatory Visit
Admission: RE | Admit: 2018-08-18 | Discharge: 2018-08-18 | Disposition: A | Discharge status: Home / Self Care | Payer: No Typology Code available for payment source | Source: Ambulatory Visit | Attending: Radiation Oncology | Admitting: Radiation Oncology

## 2018-08-18 DIAGNOSIS — Z803 Family history of malignant neoplasm of breast: Secondary | ICD-10-CM | POA: Insufficient documentation

## 2018-08-18 DIAGNOSIS — C50812 Malignant neoplasm of overlapping sites of left female breast: Secondary | ICD-10-CM | POA: Diagnosis not present

## 2018-08-18 DIAGNOSIS — Z87891 Personal history of nicotine dependence: Secondary | ICD-10-CM | POA: Insufficient documentation

## 2018-08-18 DIAGNOSIS — Z170 Estrogen receptor positive status [ER+]: Secondary | ICD-10-CM | POA: Insufficient documentation

## 2018-08-18 NOTE — Progress Notes (Signed)
Radiation Oncology         [phone] ________________________________  Name: Ashley Clark        MRN: [id number]  Date of Service: 08/18/2018 DOB: [birth year]  IW:[id number], Ashley Kirks, PA-C  Ashley Luna, MD     REFERRING PHYSICIAN: Erroll Luna, MD   DIAGNOSIS: The encounter diagnosis was Malignant neoplasm of overlapping sites of left breast in female, estrogen receptor positive ([place]).   HISTORY OF PRESENT ILLNESS: Ashley Clark is a 57 y.o. female seen at the request of Ashley Clark for a new diagnosis of left breast cancer. The patient was noted to have a screening detected mass in the left breast. She underwent diagnostic imaging which revealed a 1.2 x 1.2 x 1 cm mass at 9:30, as well as a 8 x 6 x 5 mm, and three abnormal appearing nodes in the axilla. 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 She had a biopsy also of one of the abnormal appearing nodes which was negative. Her invasive tumor was ER/PR positive, HER2 negative, with a Ki 67 of 20%. She saw Ashley Clark today as well. She comes today to discuss options of treatment of her cancer.    PREVIOUS RADIATION THERAPY: No   PAST MEDICAL HISTORY: History reviewed. No pertinent past medical history.     PAST SURGICAL HISTORY: Past Surgical History:  Procedure Laterality Date  . NO PAST SURGERIES       FAMILY HISTORY:  Family History  Problem Relation Age of Onset  . Breast cancer Mother 63  . Colon cancer Neg Hx   . Rectal cancer Neg Hx   . Stomach cancer Neg Hx   . Esophageal cancer Neg Hx      SOCIAL HISTORY:  reports that she quit smoking about 3 years ago. She smoked 0.25 packs per day. She has never used smokeless tobacco. She reports that she drinks alcohol. She reports that she does not use drugs. The patient is married and lives in [place]. She's  accompanied by her mother who was treated by Dr. Sondra Clark. She works for [employer] as a Chartered certified accountant.   ALLERGIES: Patient has no known allergies.   MEDICATIONS:  Current Outpatient Medications  Medication Sig Dispense Refill  . cetirizine (ZYRTEC) 10 MG chewable tablet Chew 10 mg by mouth daily.     No current facility-administered medications for this encounter.      REVIEW OF SYSTEMS: On review of systems, the patient reports that she is doing well overall. She feels better about knowing what her treatment plan will be now that she's met with several of her providers. She denies any chest pain, shortness of breath, cough, fevers, chills, night sweats, unintended weight changes. She denies any bowel or bladder disturbances, and denies abdominal pain, nausea or vomiting. She denies any new musculoskeletal or joint aches or pains. A complete review of systems is obtained and is otherwise negative.     PHYSICAL EXAM:  Wt Readings from Last 3 Encounters:  08/18/18 206 lb 6.4 oz (93.6 kg)  08/18/18 206 lb (93.4 kg)  04/06/15 168 lb (76.2 kg)   Temp Readings from Last 3 Encounters:  08/18/18 98.2 F (36.8 C) (Oral)  08/18/18 98.2 F (36.8 C) (Oral)  04/06/15 98.3 F (36.8 C)   BP Readings from Last 3 Encounters:  08/18/18 108/73  08/18/18 (!) 117/56  04/06/15 128/76   Pulse Readings from Last 3 Encounters:  08/18/18 63  08/18/18 63  04/06/15 65     In general this is a well appearing African American female in no acute distress. She is alert and oriented x4 and appropriate throughout the examination. HEENT reveals that the patient is normocephalic, atraumatic. EOMs are intact. Skin is intact without any evidence of gross lesions. Cardiopulmonary assessment is negative for acute distress and she exhibits normal effort.     ECOG = 0  0 - Asymptomatic (Fully active, able to carry on all predisease activities without restriction)  1 - Symptomatic but completely  ambulatory (Restricted in physically strenuous activity but ambulatory and able to carry out work of a light or sedentary nature. For example, light housework, office work)  2 - Symptomatic, <50% in bed during the day (Ambulatory and capable of all self care but unable to carry out any work activities. Up and about more than 50% of waking hours)  3 - Symptomatic, >50% in bed, but not bedbound (Capable of only limited self-care, confined to bed or chair 50% or more of waking hours)  4 - Bedbound (Completely disabled. Cannot carry on any self-care. Totally confined to bed or chair)  5 - Death   Eustace Pen MM, Creech RH, Tormey DC, et al. [phone]). "Toxicity and response criteria of the [facility]". [place] Oncol. 5 (6): 649-55    LABORATORY DATA:  No results found for: WBC, HGB, HCT, MCV, PLT No results found for: NA, K, CL, CO2 No results found for: ALT, AST, GGT, ALKPHOS, BILITOT    RADIOGRAPHY: US Breast Ltd Uni Left Inc Axilla  Addendum Date: 08/04/2018   ADDENDUM REPORT: 08/04/2018 15:56 ADDENDUM: Correction to the laterality in the body of the original report: all imaging physical exam findings are in the left breast. Electronically Signed   By: Ammie Ferrier M.D.   On: 08/04/2018 15:56   Result Date: 08/04/2018 CLINICAL DATA:  Screening recall for a left breast possible mass. Patient has family history of breast cancer in her mother at age 3. EXAM: DIGITAL DIAGNOSTIC LEFT MAMMOGRAM WITH CAD AND TOMO ULTRASOUND LEFT BREAST COMPARISON:  Previous exam(s). ACR Breast Density Category c: The breast tissue is heterogeneously dense, which may obscure small masses. FINDINGS: On the spot compression tomosynthesis images of the medial left breast there is a persistent subtle asymmetry without a correlate on the true lateral view. The abnormality appears more apparent on the screening full paddle CC image and appears to have some associated distortion. Mammographic images  were processed with CAD. On physical exam, there is a subtle palpable lump in the upper inner quadrant of the right breast. Ultrasound of the right breast at 9:30, 2 cm from the nipple demonstrates an irregular hypoechoic vascular mass measuring 1.2 x 1.2 x 1.1 cm. Closer to the nipple at 9:30, 1 cm from the nipple there is a smaller irregular hypoechoic mass measuring 0.8 x 0.5 x 0.6 cm. The larger and the smaller mass together span 3.6 cm, and they lie approximately 1.5 cm apart. Ultrasound of the left axilla demonstrates 1 lymph node with a cortex measuring up to 6 mm. There are 2 other prominent lymph nodes with cortices measuring approximately 4 mm. IMPRESSION: 1. There are 2 adjacent masses in the left breast at 9:30, 1 and 2 cm from the nipple respectively which are suspicious for malignancy. 2. There is 1 lymph node with cortex measuring  6 mm, and 2 other the borderline possibly abnormal lymph nodes. RECOMMENDATION: Ultrasound guided biopsy is recommended for the 2 adjacent masses in the left breast at 9:30 and for the largest of the left axillary lymph nodes. 2. Following biopsy, MRI should be considered given the possible multifocality and the density of the patient's breast tissue. These procedures have been scheduled for 08/04/2018 at 1:45 p.m. I have discussed the findings and recommendations with the patient. Results were also provided in writing at the conclusion of the visit. If applicable, a reminder letter will be sent to the patient regarding the next appointment. BI-RADS CATEGORY  5: Highly suggestive of malignancy. Electronically Signed: By: Ammie Ferrier M.D. On: 08/02/2018 09:52   Mm Diag Breast Tomo Uni Left  Addendum Date: 08/04/2018   ADDENDUM REPORT: 08/04/2018 15:56 ADDENDUM: Correction to the laterality in the body of the original report: all imaging physical exam findings are in the left breast. Electronically Signed   By: Ammie Ferrier M.D.   On: 08/04/2018 15:56   Result  Date: 08/04/2018 CLINICAL DATA:  Screening recall for a left breast possible mass. Patient has family history of breast cancer in her mother at age 31. EXAM: DIGITAL DIAGNOSTIC LEFT MAMMOGRAM WITH CAD AND TOMO ULTRASOUND LEFT BREAST COMPARISON:  Previous exam(s). ACR Breast Density Category c: The breast tissue is heterogeneously dense, which may obscure small masses. FINDINGS: On the spot compression tomosynthesis images of the medial left breast there is a persistent subtle asymmetry without a correlate on the true lateral view. The abnormality appears more apparent on the screening full paddle CC image and appears to have some associated distortion. Mammographic images were processed with CAD. On physical exam, there is a subtle palpable lump in the upper inner quadrant of the right breast. Ultrasound of the right breast at 9:30, 2 cm from the nipple demonstrates an irregular hypoechoic vascular mass measuring 1.2 x 1.2 x 1.1 cm. Closer to the nipple at 9:30, 1 cm from the nipple there is a smaller irregular hypoechoic mass measuring 0.8 x 0.5 x 0.6 cm. The larger and the smaller mass together span 3.6 cm, and they lie approximately 1.5 cm apart. Ultrasound of the left axilla demonstrates 1 lymph node with a cortex measuring up to 6 mm. There are 2 other prominent lymph nodes with cortices measuring approximately 4 mm. IMPRESSION: 1. There are 2 adjacent masses in the left breast at 9:30, 1 and 2 cm from the nipple respectively which are suspicious for malignancy. 2. There is 1 lymph node with cortex measuring 6 mm, and 2 other the borderline possibly abnormal lymph nodes. RECOMMENDATION: Ultrasound guided biopsy is recommended for the 2 adjacent masses in the left breast at 9:30 and for the largest of the left axillary lymph nodes. 2. Following biopsy, MRI should be considered given the possible multifocality and the density of the patient's breast tissue. These procedures have been scheduled for 08/04/2018 at  1:45 p.m. I have discussed the findings and recommendations with the patient. Results were also provided in writing at the conclusion of the visit. If applicable, a reminder letter will be sent to the patient regarding the next appointment. BI-RADS CATEGORY  5: Highly suggestive of malignancy. Electronically Signed: By: Ammie Ferrier M.D. On: 08/02/2018 09:52   Mm 3d Screen Breast Bilateral  Result Date: 07/27/2018 CLINICAL DATA:  Screening. EXAM: DIGITAL SCREENING BILATERAL MAMMOGRAM WITH TOMO AND CAD COMPARISON:  Previous exam(s). ACR Breast Density Category c: The breast tissue is heterogeneously  dense, which may obscure small masses. FINDINGS: In the left breast, a possible mass warrants further evaluation. In the right breast, no findings suspicious for malignancy. Images were processed with CAD. IMPRESSION: Further evaluation is suggested for possible mass in the left breast. RECOMMENDATION: Diagnostic mammogram and possibly ultrasound of the left breast. (Code:[id number]-[id number]) The patient will be contacted regarding the findings, and additional imaging will be scheduled. BI-RADS CATEGORY  0: Incomplete. Need additional imaging evaluation and/or prior mammograms for comparison. Electronically Signed   By: Nolon Nations M.D.   On: 07/27/2018 14:47   [place] Axillary Node Core Biopsy Left  Addendum Date: 08/05/2018   ADDENDUM REPORT: 08/05/2018 11:57 ADDENDUM: Pathology revealed GRADE II-III INVASIVE DUCTAL CARCINOMA, DUCTAL CARCINOMA IN SITU 9:30 o'clock, 2 cm fn (heart clip). This was found to be concordant by Dr. Kristopher Oppenheim. Pathology revealed PSEUDOANGIOMATOUS STROMAL HYPERPLASIA (PASH), FIBROCYSTIC CHANGE AND CALCIFICATIONS of the Left breast, 9:30 o'clock, 1 cm fn (coil clip). This was found to be discordant by Dr. Kristopher Oppenheim, with excision recommended. Pathology revealed BENIGN LYMPH NODE WITH DERMATOPATHIC CHANGE of the Left axilla. This was found to be concordant by Dr. Kristopher Oppenheim. Pathology  results were discussed with the patient by telephone. The patient reported doing well after the biopsies with tenderness at the sites. Post biopsy instructions and care were reviewed and questions were answered. The patient was encouraged to call [facility] for any additional concerns. Surgical consultation has been arranged with Dr. Erroll Clark, per patient request, at [facility] on August 09, 2018. MRI is recommended given the possible multifocality and the density of the patient's breast tissue as well as family history of breast cancer in her mother. Pathology results reported by Terie Purser, RN on 08/05/2018. Electronically Signed   By: Kristopher Oppenheim M.D.   On: 08/05/2018 11:57   Result Date: 08/05/2018 CLINICAL DATA:  Female with suspicious left breast masses and indeterminate left axillary lymphadenopathy. EXAM: ULTRASOUND GUIDED LEFT BREAST CORE NEEDLE BIOPSY X2 ULTRASOUND-GUIDED LEFT AXILLARY CORE NEEDLE BIOPSY COMPARISON:  Previous exam(s). FINDINGS: I met with the patient and we discussed the procedure of ultrasound-guided biopsy, including benefits and alternatives. We discussed the high likelihood of a successful procedure. We discussed the risks of the procedure, including infection, bleeding, tissue injury, clip migration, and inadequate sampling. Informed written consent was given. The usual time-out protocol was performed immediately prior to the procedure. Lesion quadrant: Upper inner quadrant Using sterile technique and 1% Lidocaine as local anesthetic, under direct ultrasound visualization, a 12 gauge spring-loaded device was used to perform biopsy of a mass at the 9:30 position 2 cm from the nipple using a inferior approach. At the conclusion of the procedure a heart shaped tissue marker clip was deployed into the biopsy cavity. Please note, a ribbon shaped clip was initially deployed after the biopsy but was felt to have deployed in the wrong  position. Therefore, an additional (heart shaped) clip was placed appropriately within the biopsied mass. Lesion quadrant: Upper inner quadrant Using sterile technique and 1% Lidocaine as local anesthetic, under direct ultrasound visualization, a 14 gauge spring-loaded device was used to perform biopsy of a mass at the 9:30 position 1 cm from the nipple using a inferior approach. At the conclusion of the procedure a coil shaped tissue marker clip was deployed into the biopsy cavity. Using sterile technique and 1% Lidocaine as local anesthetic, under direct ultrasound visualization, a 14 gauge spring-loaded device was used to perform biopsy of  a left axillary lymph node using a inferior approach. At the conclusion of the procedure a HydroMARK tissue marker clip was deployed into the biopsy cavity. Follow up 2 view mammogram was performed and dictated separately. IMPRESSION: Ultrasound guided biopsy of 2 suspicious left breast masses and an indeterminate left axillary lymph node. No apparent complications. Electronically Signed: By: Kristopher Oppenheim M.D. On: 08/04/2018 15:53   Mm Clip Placement Left  Result Date: 08/04/2018 CLINICAL DATA:  57 year old female status post 2 area left breast biopsy and left axillary lymph node biopsy. EXAM: DIAGNOSTIC LEFT MAMMOGRAM POST ULTRASOUND BIOPSY COMPARISON:  Previous exam(s). FINDINGS: Mammographic images were obtained following ultrasound guided biopsy of 2 left breast masses and left axillary lymph node. A heart shaped clip is identified in the upper inner left breast at middle depth in association with the previously identified mass. A coil shaped clip is identified in the upper inner quadrant, slightly anterior. This is in the region of the additional biopsied mass. Please note, the ribbon shaped clip was inadvertently deployed at the time of the first biopsy. Only 2 biopsies were done, corresponding with the heart and coil shaped clips. HydroMARK clip is identified in the  left axilla. IMPRESSION: Post biopsy clip placement as described above. The heart shaped clip corresponds with biopsied mass #1. The coil shaped clip corresponds with biopsied mass #2. Final Assessment: Post Procedure Mammograms for Marker Placement Electronically Signed   By: Kristopher Oppenheim M.D.   On: 08/04/2018 15:59   [place] Lt Breast Bx W Loc Dev 1st Lesion Img Bx Spec US Guide  Addendum Date: 08/05/2018   ADDENDUM REPORT: 08/05/2018 11:57 ADDENDUM: Pathology revealed GRADE II-III INVASIVE DUCTAL CARCINOMA, DUCTAL CARCINOMA IN SITU 9:30 o'clock, 2 cm fn (heart clip). This was found to be concordant by Dr. Kristopher Oppenheim. Pathology revealed PSEUDOANGIOMATOUS STROMAL HYPERPLASIA (PASH), FIBROCYSTIC CHANGE AND CALCIFICATIONS of the Left breast, 9:30 o'clock, 1 cm fn (coil clip). This was found to be discordant by Dr. Kristopher Oppenheim, with excision recommended. Pathology revealed BENIGN LYMPH NODE WITH DERMATOPATHIC CHANGE of the Left axilla. This was found to be concordant by Dr. Kristopher Oppenheim. Pathology results were discussed with the patient by telephone. The patient reported doing well after the biopsies with tenderness at the sites. Post biopsy instructions and care were reviewed and questions were answered. The patient was encouraged to call [facility] for any additional concerns. Surgical consultation has been arranged with Dr. Erroll Clark, per patient request, at [facility] on August 09, 2018. MRI is recommended given the possible multifocality and the density of the patient's breast tissue as well as family history of breast cancer in her mother. Pathology results reported by Terie Purser, RN on 08/05/2018. Electronically Signed   By: Kristopher Oppenheim M.D.   On: 08/05/2018 11:57   Result Date: 08/05/2018 CLINICAL DATA:  Female with suspicious left breast masses and indeterminate left axillary lymphadenopathy. EXAM: ULTRASOUND GUIDED LEFT BREAST CORE NEEDLE BIOPSY X2  ULTRASOUND-GUIDED LEFT AXILLARY CORE NEEDLE BIOPSY COMPARISON:  Previous exam(s). FINDINGS: I met with the patient and we discussed the procedure of ultrasound-guided biopsy, including benefits and alternatives. We discussed the high likelihood of a successful procedure. We discussed the risks of the procedure, including infection, bleeding, tissue injury, clip migration, and inadequate sampling. Informed written consent was given. The usual time-out protocol was performed immediately prior to the procedure. Lesion quadrant: Upper inner quadrant Using sterile technique and 1% Lidocaine as local anesthetic, under direct ultrasound  visualization, a 12 gauge spring-loaded device was used to perform biopsy of a mass at the 9:30 position 2 cm from the nipple using a inferior approach. At the conclusion of the procedure a heart shaped tissue marker clip was deployed into the biopsy cavity. Please note, a ribbon shaped clip was initially deployed after the biopsy but was felt to have deployed in the wrong position. Therefore, an additional (heart shaped) clip was placed appropriately within the biopsied mass. Lesion quadrant: Upper inner quadrant Using sterile technique and 1% Lidocaine as local anesthetic, under direct ultrasound visualization, a 14 gauge spring-loaded device was used to perform biopsy of a mass at the 9:30 position 1 cm from the nipple using a inferior approach. At the conclusion of the procedure a coil shaped tissue marker clip was deployed into the biopsy cavity. Using sterile technique and 1% Lidocaine as local anesthetic, under direct ultrasound visualization, a 14 gauge spring-loaded device was used to perform biopsy of a left axillary lymph node using a inferior approach. At the conclusion of the procedure a HydroMARK tissue marker clip was deployed into the biopsy cavity. Follow up 2 view mammogram was performed and dictated separately. IMPRESSION: Ultrasound guided biopsy of 2 suspicious left  breast masses and an indeterminate left axillary lymph node. No apparent complications. Electronically Signed: By: Kristopher Oppenheim M.D. On: 08/04/2018 15:53   [place] Lt Breast Bx W Loc Dev Ea Add Lesion Img Bx Spec US Guide  Addendum Date: 08/05/2018   ADDENDUM REPORT: 08/05/2018 11:57 ADDENDUM: Pathology revealed GRADE II-III INVASIVE DUCTAL CARCINOMA, DUCTAL CARCINOMA IN SITU 9:30 o'clock, 2 cm fn (heart clip). This was found to be concordant by Dr. Kristopher Oppenheim. Pathology revealed PSEUDOANGIOMATOUS STROMAL HYPERPLASIA (PASH), FIBROCYSTIC CHANGE AND CALCIFICATIONS of the Left breast, 9:30 o'clock, 1 cm fn (coil clip). This was found to be discordant by Dr. Kristopher Oppenheim, with excision recommended. Pathology revealed BENIGN LYMPH NODE WITH DERMATOPATHIC CHANGE of the Left axilla. This was found to be concordant by Dr. Kristopher Oppenheim. Pathology results were discussed with the patient by telephone. The patient reported doing well after the biopsies with tenderness at the sites. Post biopsy instructions and care were reviewed and questions were answered. The patient was encouraged to call [facility] for any additional concerns. Surgical consultation has been arranged with Dr. Erroll Clark, per patient request, at [facility] on August 09, 2018. MRI is recommended given the possible multifocality and the density of the patient's breast tissue as well as family history of breast cancer in her mother. Pathology results reported by Terie Purser, RN on 08/05/2018. Electronically Signed   By: Kristopher Oppenheim M.D.   On: 08/05/2018 11:57   Result Date: 08/05/2018 CLINICAL DATA:  Female with suspicious left breast masses and indeterminate left axillary lymphadenopathy. EXAM: ULTRASOUND GUIDED LEFT BREAST CORE NEEDLE BIOPSY X2 ULTRASOUND-GUIDED LEFT AXILLARY CORE NEEDLE BIOPSY COMPARISON:  Previous exam(s). FINDINGS: I met with the patient and we discussed the procedure of  ultrasound-guided biopsy, including benefits and alternatives. We discussed the high likelihood of a successful procedure. We discussed the risks of the procedure, including infection, bleeding, tissue injury, clip migration, and inadequate sampling. Informed written consent was given. The usual time-out protocol was performed immediately prior to the procedure. Lesion quadrant: Upper inner quadrant Using sterile technique and 1% Lidocaine as local anesthetic, under direct ultrasound visualization, a 12 gauge spring-loaded device was used to perform biopsy of a mass at the 9:30 position 2 cm from  the nipple using a inferior approach. At the conclusion of the procedure a heart shaped tissue marker clip was deployed into the biopsy cavity. Please note, a ribbon shaped clip was initially deployed after the biopsy but was felt to have deployed in the wrong position. Therefore, an additional (heart shaped) clip was placed appropriately within the biopsied mass. Lesion quadrant: Upper inner quadrant Using sterile technique and 1% Lidocaine as local anesthetic, under direct ultrasound visualization, a 14 gauge spring-loaded device was used to perform biopsy of a mass at the 9:30 position 1 cm from the nipple using a inferior approach. At the conclusion of the procedure a coil shaped tissue marker clip was deployed into the biopsy cavity. Using sterile technique and 1% Lidocaine as local anesthetic, under direct ultrasound visualization, a 14 gauge spring-loaded device was used to perform biopsy of a left axillary lymph node using a inferior approach. At the conclusion of the procedure a HydroMARK tissue marker clip was deployed into the biopsy cavity. Follow up 2 view mammogram was performed and dictated separately. IMPRESSION: Ultrasound guided biopsy of 2 suspicious left breast masses and an indeterminate left axillary lymph node. No apparent complications. Electronically Signed: By: Kristopher Oppenheim M.D. On: 08/04/2018  15:53       IMPRESSION/PLAN: 1. Clark [place], [id number], grade 2 ER/PR positive, invasive ductal carcinoma of the right breast. Dr. Lisbeth Renshaw discusses the pathology findings and reviews the nature of invasive breast disease. The consensus from the breast conference includes an MRI of the breast to determine extent of disease. We anticipate she would be a good candidate for breast conservation with lumpectomy with sentinel node biopsy. Ashley Clark recommends her tumor be tested for Oncotype Dx score to determine a role for systemic therapy. Provided that chemotherapy is not indicated, the patient's course would then be followed by external radiotherapy to the breast followed by antiestrogen therapy. We discussed the risks, benefits, short, and long term effects of radiotherapy, and the patient is interested in proceeding. Dr. Lisbeth Renshaw discusses the delivery and logistics of radiotherapy and anticipates a course of 6 1/2 weeks of radiotherapy. We will see her back about 2 weeks after surgery to discuss the simulation process and anticipate we starting radiotherapy about 4-6 weeks after surgery.  2. Possible genetic predisposition to malignancy. The patient is a candidate for genetic testing given her personal and family history. She was offered referral previously and is set up for this later in the week.  In a visit lasting 60 minutes, greater than 50% of the time was spent face to face discussing her case, and coordinating the patient's care.  The above documentation reflects my direct findings during this shared patient visit. Please see the separate note by Dr. Lisbeth Renshaw on this date for the remainder of the patient's plan of care.    Carola Rhine, PAC

## 2018-08-18 NOTE — Assessment & Plan Note (Signed)
08/04/2018:Screening mammogram detected left breast mass at 9:30 position 1.2 x 1.2 x 1.1 cm.  Closer to the nipple at 9:30 position 0.8 cm lesion, total span 3.6 cm and a 1.5 cm apart.  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 After reviewing the pathology in detail, we proceeded to discuss the different treatment options between surgery, radiation, chemotherapy, antiestrogen therapies.  Recommendations: Breast MRI 1. Breast conserving surgery followed by 2. Oncotype DX testing to determine if chemotherapy would be of any benefit followed by 3. Adjuvant radiation therapy followed by 4. Adjuvant antiestrogen therapy  Oncotype counseling: I discussed Oncotype DX test. I explained to the patient that this is a 21 gene panel to evaluate patient tumors DNA to calculate recurrence score. This would help determine whether patient has high risk or intermediate risk or low risk breast cancer. She understands that if her tumor was found to be high risk, she would benefit from systemic chemotherapy. If low risk, no need of chemotherapy. If she was found to be intermediate risk, we would need to evaluate the score as well as other risk factors and determine if an abbreviated chemotherapy may be of benefit.  Return to clinic after surgery to discuss final pathology report and then determine if Oncotype DX testing will need to be sent.

## 2018-08-18 NOTE — Progress Notes (Signed)
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  Did patient present with symptoms (if so, please note symptoms) or was this found on screening mammography?: Patient had abnormal screening mammogram.  2 adjacent masses 1 cm and 2 cm from nipple, lower outer and inner quadrant.  No history of pain, discharge, or mass.  Ultrasound right breast: 9:30, 2cm from the nipple demonstrates an irregular hypoechoic vascular mass measuring 1.2 x 1.2 x 1.1 cm.  Closer to the nipple at 9:30, 1 cm from the nipple there is a smaller irregular hypoechoic mass measuring 0.8 x 0.5 x 0.6 cm.  Left Axilla Ultrasound: 1 lymph node with a cortex measuring up to 6 mm.  There are 2 other prominent lymph nodes with cortices measuring approximately 72m.  Histology per Pathology Report: Left Breast 08/04/2018   Receptor Status: ER(+ 90%), PR (+ 95%), Her2-neu (-), Ki-67(20%)  Past/Anticipated interventions by surgeon, if any: Dr. CBrantley Stage8/13/2019 -We discussed the staging and pathophysiology of breast cancer.  We discussed all of the different options for treatment for breast cancer including surgery, chemotherapy, radiation therapy, herceptin, and antiestrogen therapy.  -We discussed a sentinel lymph node biopsy as she does not appear to be having lymph node involvement right now. -We will await the final pathology to make any further decisions in terms of her treatment. -One of these options might be to return to the operating room to perform an axillary lymph node dissection. -We discussed a 10-20% chance of a positive margin requiring re-excision in the operating room.  We also discussed that she may need radiation therapy or antiestrogen therapy or both if she undergoes lumpectomy.      Past/Anticipated interventions by medical oncology, if any: Chemotherapy  Dr. GLindi Adie8/21 1pm Discussed with the patient, the details of pathology including  the type of breast cancer,the clinical staging, the significance of ER, PR and HER-2/neu receptors and the implications for treatment. After reviewing the pathology in detail, we proceeded to discuss the different treatment options between surgery, radiation, chemotherapy, antiestrogen therapies.  Breast MRI 1. Breast conserving surgery followed by 2. Oncotype DX testing to determine if chemotherapy would be of any benefit followed by 3. Adjuvant radiation therapy followed by 4. Adjuvant antiestrogen therapy  Lymphedema issues, if any:  No  Pain issues, if any:  No  BP 108/73 (BP Location: Right Arm, Patient Position: Sitting)   Pulse 63   Temp 98.2 F (36.8 C) (Oral)   Resp 16   Ht 5' 1.5" (1.562 m)   Wt 206 lb 6.4 oz (93.6 kg)   LMP 03/12/2015   SpO2 99%   BMI 38.37 kg/m    Wt Readings from Last 3 Encounters:  08/18/18 206 lb 6.4 oz (93.6 kg)  08/18/18 206 lb (93.4 kg)  04/06/15 168 lb (76.2 kg)    SAFETY ISSUES:  Prior radiation? No  Pacemaker/ICD? No  Possible current pregnancy? No  Is the patient on methotrexate? No  Current Complaints / other details:   -MRI Breast scheduled 8/23, 8am -Genetics scheduled 9/11, 9am -Family history or breast cancer in her mother at age 56   SCori Razor RN 08/18/2018,8:20 AM

## 2018-08-18 NOTE — Progress Notes (Signed)
[place] CONSULT NOTE  Patient Care Team: [name], Barth Kirks, PA-C as PCP - General (Nurse Practitioner)  CHIEF COMPLAINTS/PURPOSE OF CONSULTATION:  Newly diagnosed breast cancer  HISTORY OF PRESENTING ILLNESS:  Ashley Clark 57 y.o. female is here because of recent diagnosis of left breast cancer.  Patient had a routine screening mammogram that detected mass at 9:30 position.  She also had additional findings closer to the nipple at the same 9:30 position.  She also had 3 lymph nodes in the left axilla.  Biopsy of the lymph nodes was benign.  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  She was seen by Dr. Brantley Stage who recommended lumpectomy.  He was also going to obtain a breast MRI.  She was referred to [place] to discuss adjuvant treatment options. 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.  I reviewed her records extensively and collaborated the history with the patient.  SUMMARY OF ONCOLOGIC HISTORY:   Malignant neoplasm of overlapping sites of left breast in female, estrogen receptor positive ([place])   08/04/2018 Initial Diagnosis    Screening mammogram detected left breast mass at 9:30 position 1.2 x 1.2 x 1.1 cm.  Closer to the nipple at 9:30 position 0.8 cm lesion span 3.6 cm and a 1.5 cm apart.  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 NO PAST SURGERIES    Age of menarche 83 Age of menopause 34 Gravida 2 para 1 Age at first childbirth 43  years  SOCIAL HISTORY: Former smoker, denies any alcohol or recreational drug use FAMILY HISTORY: Family History  Problem Relation Age of Onset  . Breast cancer Mother 38  . Colon cancer Neg Hx   . Rectal cancer Neg Hx   . Stomach cancer Neg Hx   . Esophageal cancer Neg Hx     ALLERGIES:  has No Known Allergies.  MEDICATIONS:  Current Outpatient Medications  Medication Sig Dispense Refill  . cetirizine (ZYRTEC) 10 MG chewable tablet Chew 10 mg by mouth daily.     No current facility-administered medications for this visit.     REVIEW OF SYSTEMS:   Constitutional: Denies fevers, chills or abnormal night sweats Eyes: Denies blurriness of vision, double vision or watery eyes Ears, nose, mouth, throat, and face: Denies mucositis or sore throat Respiratory: Denies cough, dyspnea or wheezes Cardiovascular: Denies palpitation, chest discomfort or lower extremity swelling Gastrointestinal:  Denies nausea, heartburn or change in bowel habits Skin: Denies abnormal skin rashes Lymphatics: Denies new lymphadenopathy or easy bruising Neurological:Denies numbness, tingling or new weaknesses Behavioral/Psych: Mood is stable, no new changes  Breast:  Denies any palpable lumps or discharge All other systems were reviewed with the patient and are negative.  PHYSICAL EXAMINATION: ECOG PERFORMANCE STATUS: 0 - Asymptomatic  Vitals:   08/18/18 1246  BP: (!) 117/56  Pulse: 63  Resp: 18  Temp: 98.2 F (36.8 C)  SpO2: 96%  Filed Weights   08/18/18 1246  Weight: 206 lb (93.4 kg)    GENERAL:alert, no distress and comfortable SKIN: skin color, texture, turgor are normal, no rashes or significant lesions EYES: normal, conjunctiva are pink and non-injected, sclera clear OROPHARYNX:no exudate, no erythema and lips, buccal mucosa, and tongue normal  NECK: supple, thyroid normal size, non-tender, without nodularity LYMPH:  no palpable lymphadenopathy in the cervical, axillary or  inguinal LUNGS: clear to auscultation and percussion with normal breathing effort HEART: regular rate & rhythm and no murmurs and no lower extremity edema ABDOMEN:abdomen soft, non-tender and normal bowel sounds Musculoskeletal:no cyanosis of digits and no clubbing  PSYCH: alert & oriented x 3 with fluent speech NEURO: no focal motor/sensory deficits  RADIOGRAPHIC STUDIES: I have personally reviewed the radiological reports and agreed with the findings in the report.  ASSESSMENT AND PLAN:  Malignant neoplasm of overlapping sites of left breast in female, estrogen receptor positive ([place]) 08/04/2018:Screening mammogram detected left breast mass at 9:30 position 1.2 x 1.2 x 1.1 cm.  Closer to the nipple at 9:30 position 0.8 cm lesion, total span 3.6 cm and a 1.5 cm apart.  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 After reviewing the pathology in detail, we proceeded to discuss the different treatment options between surgery, radiation, chemotherapy, antiestrogen therapies.  Recommendations: Breast MRI 1. Breast conserving surgery followed by 2. Oncotype DX testing to determine if chemotherapy would be of any benefit followed by 3. Adjuvant radiation therapy followed by 4. Adjuvant antiestrogen therapy  Oncotype counseling: I discussed Oncotype DX test. I explained to the patient that this is a 21 gene panel to evaluate patient tumors DNA to calculate recurrence score. This would help determine whether patient has high risk or intermediate risk or  low risk breast cancer. She understands that if her tumor was found to be high risk, she would benefit from systemic chemotherapy. If low risk, no need of chemotherapy. If she was found to be intermediate risk, we would need to evaluate the score as well as other risk factors and determine if an abbreviated chemotherapy may be of benefit.  Return to clinic after surgery to discuss final pathology report and then determine if Oncotype DX testing will need to be sent.     All questions were answered. The patient knows to call the clinic with any problems, questions or concerns.    Harriette Ohara, MD 08/18/18

## 2018-08-20 ENCOUNTER — Ambulatory Visit
Admission: RE | Admit: 2018-08-20 | Discharge: 2018-08-20 | Disposition: A | Discharge status: Home / Self Care | Payer: No Typology Code available for payment source | Source: Ambulatory Visit | Attending: Surgery | Admitting: Surgery

## 2018-08-20 ENCOUNTER — Encounter: Payer: Self-pay | Admitting: *Deleted

## 2018-08-20 ENCOUNTER — Encounter: Payer: Self-pay | Admitting: Gastroenterology

## 2018-08-20 DIAGNOSIS — Z171 Estrogen receptor negative status [ER-]: Principal | ICD-10-CM

## 2018-08-20 DIAGNOSIS — C50411 Malignant neoplasm of upper-outer quadrant of right female breast: Secondary | ICD-10-CM

## 2018-08-20 DIAGNOSIS — C50412 Malignant neoplasm of upper-outer quadrant of left female breast: Secondary | ICD-10-CM

## 2018-08-20 MED ORDER — GADOBENATE DIMEGLUMINE 529 MG/ML IV SOLN
19.0000 mL | Freq: Once | INTRAVENOUS | Status: AC | PRN
  Administered 2018-08-20: 19 mL via INTRAVENOUS

## 2018-08-27 ENCOUNTER — Telehealth: Payer: Self-pay

## 2018-08-27 ENCOUNTER — Ambulatory Visit: Payer: Self-pay | Admitting: Surgery

## 2018-08-27 DIAGNOSIS — C50912 Malignant neoplasm of unspecified site of left female breast: Secondary | ICD-10-CM

## 2018-08-27 DIAGNOSIS — Z170 Estrogen receptor positive status [ER+]: Principal | ICD-10-CM

## 2018-08-27 NOTE — Telephone Encounter (Signed)
Called patient to inform her of the her canceled appointments request. Per 8/30 los Did not leave a detailed msg because unknown gentleman answer the phone.

## 2018-09-07 ENCOUNTER — Other Ambulatory Visit: Payer: Self-pay | Admitting: Surgery

## 2018-09-07 DIAGNOSIS — C50912 Malignant neoplasm of unspecified site of left female breast: Secondary | ICD-10-CM

## 2018-09-07 DIAGNOSIS — Z170 Estrogen receptor positive status [ER+]: Principal | ICD-10-CM

## 2018-09-08 ENCOUNTER — Telehealth: Payer: Self-pay | Admitting: Hematology and Oncology

## 2018-09-08 ENCOUNTER — Other Ambulatory Visit: Payer: No Typology Code available for payment source

## 2018-09-08 ENCOUNTER — Encounter: Payer: No Typology Code available for payment source | Admitting: Genetic Counselor

## 2018-09-08 NOTE — Telephone Encounter (Signed)
Appt scheduled patient notified per 9/10 sch msg

## 2018-09-23 ENCOUNTER — Encounter (HOSPITAL_BASED_OUTPATIENT_CLINIC_OR_DEPARTMENT_OTHER): Payer: Self-pay | Admitting: *Deleted

## 2018-09-23 ENCOUNTER — Other Ambulatory Visit: Payer: Self-pay

## 2018-09-27 ENCOUNTER — Encounter (HOSPITAL_BASED_OUTPATIENT_CLINIC_OR_DEPARTMENT_OTHER)
Admission: RE | Admit: 2018-09-27 | Discharge: 2018-09-27 | Disposition: A | Discharge status: Home / Self Care | Payer: No Typology Code available for payment source | Source: Ambulatory Visit | Attending: Surgery | Admitting: Surgery

## 2018-09-27 DIAGNOSIS — C50412 Malignant neoplasm of upper-outer quadrant of left female breast: Secondary | ICD-10-CM | POA: Diagnosis not present

## 2018-09-27 DIAGNOSIS — C50912 Malignant neoplasm of unspecified site of left female breast: Secondary | ICD-10-CM

## 2018-09-27 DIAGNOSIS — Z170 Estrogen receptor positive status [ER+]: Secondary | ICD-10-CM | POA: Diagnosis not present

## 2018-09-27 DIAGNOSIS — Z01812 Encounter for preprocedural laboratory examination: Secondary | ICD-10-CM | POA: Diagnosis not present

## 2018-09-27 LAB — CBC WITH DIFFERENTIAL/PLATELET
ABS IMMATURE GRANULOCYTES: 0 10*3/uL (ref 0.0–0.1)
BASOS PCT: 1 %
Basophils Absolute: 0 10*3/uL (ref 0.0–0.1)
Eosinophils Absolute: 0.1 10*3/uL (ref 0.0–0.7)
Eosinophils Relative: 2 %
HCT: 43.6 % (ref 36.0–46.0)
HEMOGLOBIN: 13.7 g/dL (ref 12.0–15.0)
Immature Granulocytes: 0 %
Lymphocytes Relative: 34 %
Lymphs Abs: 1.7 10*3/uL (ref 0.7–4.0)
MCH: 26.8 pg (ref 26.0–34.0)
MCHC: 31.4 g/dL (ref 30.0–36.0)
MCV: 85.3 fL (ref 78.0–100.0)
MONOS PCT: 11 %
Monocytes Absolute: 0.6 10*3/uL (ref 0.1–1.0)
NEUTROS ABS: 2.6 10*3/uL (ref 1.7–7.7)
Neutrophils Relative %: 52 %
PLATELETS: 268 10*3/uL (ref 150–400)
RBC: 5.11 MIL/uL (ref 3.87–5.11)
RDW: 12.9 % (ref 11.5–15.5)
WBC: 5 10*3/uL (ref 4.0–10.5)

## 2018-09-27 NOTE — Progress Notes (Signed)
CBC with diff and CMET done. Hbiclens given with instructions. Ensure presurg given to complete by 0104 UEB VPL otherwise. Pt verbalized understanding.

## 2018-09-28 ENCOUNTER — Ambulatory Visit
Admission: RE | Admit: 2018-09-28 | Discharge: 2018-09-28 | Disposition: A | Discharge status: Home / Self Care | Payer: No Typology Code available for payment source | Source: Ambulatory Visit | Attending: Surgery | Admitting: Surgery

## 2018-09-28 ENCOUNTER — Other Ambulatory Visit: Payer: Self-pay | Admitting: Surgery

## 2018-09-28 DIAGNOSIS — Z170 Estrogen receptor positive status [ER+]: Principal | ICD-10-CM

## 2018-09-28 DIAGNOSIS — C50912 Malignant neoplasm of unspecified site of left female breast: Secondary | ICD-10-CM

## 2018-09-29 ENCOUNTER — Encounter (HOSPITAL_COMMUNITY)
Admission: RE | Admit: 2018-09-29 | Discharge: 2018-09-29 | Disposition: A | Discharge status: Home / Self Care | Payer: No Typology Code available for payment source | Source: Ambulatory Visit | Attending: Surgery | Admitting: Surgery

## 2018-09-29 ENCOUNTER — Ambulatory Visit
Admission: RE | Admit: 2018-09-29 | Discharge: 2018-09-29 | Disposition: A | Discharge status: Home / Self Care | Payer: No Typology Code available for payment source | Source: Ambulatory Visit | Attending: Surgery | Admitting: Surgery

## 2018-09-29 ENCOUNTER — Ambulatory Visit (HOSPITAL_BASED_OUTPATIENT_CLINIC_OR_DEPARTMENT_OTHER)
Admission: RE | Admit: 2018-09-29 | Discharge: 2018-09-29 | Disposition: A | Discharge status: Home / Self Care | Payer: No Typology Code available for payment source | Source: Ambulatory Visit | Attending: Surgery | Admitting: Surgery

## 2018-09-29 ENCOUNTER — Ambulatory Visit (HOSPITAL_BASED_OUTPATIENT_CLINIC_OR_DEPARTMENT_OTHER): Payer: No Typology Code available for payment source | Admitting: Anesthesiology

## 2018-09-29 ENCOUNTER — Encounter (HOSPITAL_BASED_OUTPATIENT_CLINIC_OR_DEPARTMENT_OTHER): Payer: Self-pay | Admitting: *Deleted

## 2018-09-29 ENCOUNTER — Encounter (HOSPITAL_BASED_OUTPATIENT_CLINIC_OR_DEPARTMENT_OTHER)
Admission: RE | Disposition: A | Discharge status: Home / Self Care | Payer: Self-pay | Source: Ambulatory Visit | Attending: Surgery

## 2018-09-29 ENCOUNTER — Other Ambulatory Visit: Payer: Self-pay

## 2018-09-29 DIAGNOSIS — Z170 Estrogen receptor positive status [ER+]: Principal | ICD-10-CM

## 2018-09-29 DIAGNOSIS — Z01812 Encounter for preprocedural laboratory examination: Secondary | ICD-10-CM | POA: Insufficient documentation

## 2018-09-29 DIAGNOSIS — C50412 Malignant neoplasm of upper-outer quadrant of left female breast: Secondary | ICD-10-CM | POA: Insufficient documentation

## 2018-09-29 DIAGNOSIS — C50912 Malignant neoplasm of unspecified site of left female breast: Secondary | ICD-10-CM

## 2018-09-29 HISTORY — PX: BREAST LUMPECTOMY WITH RADIOACTIVE SEED AND SENTINEL LYMPH NODE BIOPSY: SHX6550

## 2018-09-29 HISTORY — DX: Other specified health status: Z78.9

## 2018-09-29 SURGERY — BREAST LUMPECTOMY WITH RADIOACTIVE SEED AND SENTINEL LYMPH NODE BIOPSY
Anesthesia: General | Site: Breast | Laterality: Left

## 2018-09-29 MED ORDER — SODIUM CHLORIDE 0.9 % IJ SOLN
INTRAMUSCULAR | Status: AC
  Filled 2018-09-29: qty 10

## 2018-09-29 MED ORDER — FENTANYL CITRATE (PF) 100 MCG/2ML IJ SOLN
50.0000 ug | INTRAMUSCULAR | Status: AC | PRN
  Administered 2018-09-29: 50 ug via INTRAVENOUS
  Administered 2018-09-29 (×2): 25 ug via INTRAVENOUS
  Administered 2018-09-29: 50 ug via INTRAVENOUS

## 2018-09-29 MED ORDER — CHLORHEXIDINE GLUCONATE CLOTH 2 % EX PADS: 6.0000 | MEDICATED_PAD | Freq: Once | CUTANEOUS | Status: DC

## 2018-09-29 MED ORDER — SCOPOLAMINE 1 MG/3DAYS TD PT72: 1.0000 | MEDICATED_PATCH | Freq: Once | TRANSDERMAL | Status: DC | PRN

## 2018-09-29 MED ORDER — MEPERIDINE HCL 25 MG/ML IJ SOLN: 6.2500 mg | INTRAMUSCULAR | Status: DC | PRN

## 2018-09-29 MED ORDER — ACETAMINOPHEN 500 MG PO TABS
ORAL_TABLET | ORAL | Status: AC
  Filled 2018-09-29: qty 2

## 2018-09-29 MED ORDER — METHYLENE BLUE 0.5 % INJ SOLN
INTRAVENOUS | Status: AC
  Filled 2018-09-29: qty 10

## 2018-09-29 MED ORDER — DEXTROSE 5 % IV SOLN
3.0000 g | INTRAVENOUS | Status: AC
  Administered 2018-09-29: 2 g via INTRAVENOUS

## 2018-09-29 MED ORDER — MIDAZOLAM HCL 2 MG/2ML IJ SOLN
INTRAMUSCULAR | Status: AC
  Filled 2018-09-29: qty 2

## 2018-09-29 MED ORDER — OXYCODONE HCL 5 MG/5ML PO SOLN: 5.0000 mg | Freq: Once | ORAL | Status: DC | PRN

## 2018-09-29 MED ORDER — CELECOXIB 400 MG PO CAPS
400.0000 mg | ORAL_CAPSULE | ORAL | Status: AC
  Administered 2018-09-29: 200 mg via ORAL

## 2018-09-29 MED ORDER — EPHEDRINE SULFATE 50 MG/ML IJ SOLN
INTRAMUSCULAR | Status: DC | PRN
  Administered 2018-09-29 (×3): 10 mg via INTRAVENOUS

## 2018-09-29 MED ORDER — CLONIDINE HCL (ANALGESIA) 100 MCG/ML EP SOLN
EPIDURAL | Status: DC | PRN
  Administered 2018-09-29: 50 ug

## 2018-09-29 MED ORDER — FENTANYL CITRATE (PF) 100 MCG/2ML IJ SOLN: 25.0000 ug | INTRAMUSCULAR | Status: DC | PRN

## 2018-09-29 MED ORDER — GABAPENTIN 300 MG PO CAPS
ORAL_CAPSULE | ORAL | Status: AC
  Filled 2018-09-29: qty 1

## 2018-09-29 MED ORDER — BUPIVACAINE HCL (PF) 0.5 % IJ SOLN
INTRAMUSCULAR | Status: DC | PRN
  Administered 2018-09-29: 30 mL

## 2018-09-29 MED ORDER — OXYCODONE HCL 5 MG PO TABS: 5.0000 mg | ORAL_TABLET | Freq: Four times a day (QID) | ORAL | 0 refills | Status: DC | PRN

## 2018-09-29 MED ORDER — FENTANYL CITRATE (PF) 100 MCG/2ML IJ SOLN
INTRAMUSCULAR | Status: AC
  Filled 2018-09-29: qty 2

## 2018-09-29 MED ORDER — IBUPROFEN 800 MG PO TABS: 800.0000 mg | ORAL_TABLET | Freq: Three times a day (TID) | ORAL | 0 refills | Status: DC | PRN

## 2018-09-29 MED ORDER — PROPOFOL 500 MG/50ML IV EMUL
INTRAVENOUS | Status: DC | PRN
  Administered 2018-09-29: 75 ug/kg/min via INTRAVENOUS

## 2018-09-29 MED ORDER — ONDANSETRON HCL 4 MG/2ML IJ SOLN
INTRAMUSCULAR | Status: DC | PRN
  Administered 2018-09-29: 4 mg via INTRAVENOUS

## 2018-09-29 MED ORDER — PROPOFOL 10 MG/ML IV BOLUS
INTRAVENOUS | Status: DC | PRN
  Administered 2018-09-29: 200 mg via INTRAVENOUS

## 2018-09-29 MED ORDER — GABAPENTIN 300 MG PO CAPS
300.0000 mg | ORAL_CAPSULE | ORAL | Status: AC
  Administered 2018-09-29: 300 mg via ORAL

## 2018-09-29 MED ORDER — DEXAMETHASONE SODIUM PHOSPHATE 4 MG/ML IJ SOLN
INTRAMUSCULAR | Status: DC | PRN
  Administered 2018-09-29: 10 mg via INTRAVENOUS

## 2018-09-29 MED ORDER — LIDOCAINE HCL (CARDIAC) PF 100 MG/5ML IV SOSY
PREFILLED_SYRINGE | INTRAVENOUS | Status: DC | PRN
  Administered 2018-09-29: 30 mg via INTRAVENOUS

## 2018-09-29 MED ORDER — PROMETHAZINE HCL 25 MG/ML IJ SOLN: 6.2500 mg | INTRAMUSCULAR | Status: DC | PRN

## 2018-09-29 MED ORDER — TECHNETIUM TC 99M SULFUR COLLOID FILTERED
1.0000 | Freq: Once | INTRAVENOUS | Status: AC | PRN
  Administered 2018-09-29: 1 via INTRADERMAL

## 2018-09-29 MED ORDER — OXYCODONE HCL 5 MG PO TABS: 5.0000 mg | ORAL_TABLET | Freq: Once | ORAL | Status: DC | PRN

## 2018-09-29 MED ORDER — ACETAMINOPHEN 500 MG PO TABS
1000.0000 mg | ORAL_TABLET | ORAL | Status: AC
  Administered 2018-09-29: 1000 mg via ORAL

## 2018-09-29 MED ORDER — LACTATED RINGERS IV SOLN
INTRAVENOUS | Status: DC
  Administered 2018-09-29 (×2): via INTRAVENOUS

## 2018-09-29 MED ORDER — CELECOXIB 200 MG PO CAPS
ORAL_CAPSULE | ORAL | Status: AC
  Filled 2018-09-29: qty 2

## 2018-09-29 MED ORDER — MIDAZOLAM HCL 2 MG/2ML IJ SOLN
1.0000 mg | INTRAMUSCULAR | Status: DC | PRN
  Administered 2018-09-29: 2 mg via INTRAVENOUS

## 2018-09-29 MED ORDER — CEFAZOLIN SODIUM-DEXTROSE 2-4 GM/100ML-% IV SOLN
INTRAVENOUS | Status: AC
  Filled 2018-09-29: qty 100

## 2018-09-29 MED ORDER — BUPIVACAINE-EPINEPHRINE (PF) 0.25% -1:200000 IJ SOLN
INTRAMUSCULAR | Status: DC | PRN
  Administered 2018-09-29: 30 mL

## 2018-09-29 MED FILL — oxyCODONE HCL 5 MG TABS: 5 | 2 days supply | Qty: 10 | Fill #0

## 2018-09-29 MED FILL — IBUPROFEN 800 MG TAB: 800 | 10 days supply | Qty: 30 | Fill #0

## 2018-09-29 SURGICAL SUPPLY — 54 items
ADH SKN CLS APL DERMABOND .7 (GAUZE/BANDAGES/DRESSINGS) ×1
APPLIER CLIP 9.375 MED OPEN (MISCELLANEOUS) ×2
APR CLP MED 9.3 20 MLT OPN (MISCELLANEOUS) ×1
BINDER BREAST LRG (GAUZE/BANDAGES/DRESSINGS) IMPLANT
BINDER BREAST MEDIUM (GAUZE/BANDAGES/DRESSINGS) IMPLANT
BINDER BREAST XLRG (GAUZE/BANDAGES/DRESSINGS) IMPLANT
BINDER BREAST XXLRG (GAUZE/BANDAGES/DRESSINGS) ×1 IMPLANT
BLADE SURG 15 STRL LF DISP TIS (BLADE) ×1 IMPLANT
BLADE SURG 15 STRL SS (BLADE) ×4
CANISTER SUC SOCK COL 7IN (MISCELLANEOUS) IMPLANT
CANISTER SUCT 1200ML W/VALVE (MISCELLANEOUS) ×2 IMPLANT
CHLORAPREP W/TINT 26ML (MISCELLANEOUS) ×2 IMPLANT
CLIP APPLIE 9.375 MED OPEN (MISCELLANEOUS) ×1 IMPLANT
COVER BACK TABLE 60X90IN (DRAPES) ×2 IMPLANT
COVER MAYO STAND STRL (DRAPES) ×2 IMPLANT
COVER PROBE W GEL 5X96 (DRAPES) ×2 IMPLANT
DECANTER SPIKE VIAL GLASS SM (MISCELLANEOUS) IMPLANT
DERMABOND ADVANCED (GAUZE/BANDAGES/DRESSINGS) ×1
DERMABOND ADVANCED .7 DNX12 (GAUZE/BANDAGES/DRESSINGS) ×1 IMPLANT
DEVICE DUBIN W/COMP PLATE 8390 (MISCELLANEOUS) ×2 IMPLANT
DRAPE LAPAROSCOPIC ABDOMINAL (DRAPES) ×2 IMPLANT
DRAPE UTILITY XL STRL (DRAPES) ×2 IMPLANT
ELECT COATED BLADE 2.86 ST (ELECTRODE) ×2 IMPLANT
ELECT REM PT RETURN 9FT ADLT (ELECTROSURGICAL) ×2
ELECTRODE REM PT RTRN 9FT ADLT (ELECTROSURGICAL) ×1 IMPLANT
GLOVE BIO SURGEON STRL SZ7 (GLOVE) ×1 IMPLANT
GLOVE BIOGEL PI IND STRL 7.0 (GLOVE) IMPLANT
GLOVE BIOGEL PI IND STRL 7.5 (GLOVE) IMPLANT
GLOVE BIOGEL PI IND STRL 8 (GLOVE) ×1 IMPLANT
GLOVE BIOGEL PI INDICATOR 7.0 (GLOVE) ×1
GLOVE BIOGEL PI INDICATOR 7.5 (GLOVE) ×1
GLOVE BIOGEL PI INDICATOR 8 (GLOVE) ×1
GLOVE ECLIPSE 8.0 STRL XLNG CF (GLOVE) ×2 IMPLANT
GOWN STRL REUS W/ TWL LRG LVL3 (GOWN DISPOSABLE) ×2 IMPLANT
GOWN STRL REUS W/TWL LRG LVL3 (GOWN DISPOSABLE) ×4
HEMOSTAT ARISTA ABSORB 3G PWDR (MISCELLANEOUS) IMPLANT
HEMOSTAT SNOW SURGICEL 2X4 (HEMOSTASIS) IMPLANT
KIT MARKER MARGIN INK (KITS) ×2 IMPLANT
NDL HYPO 25X1 1.5 SAFETY (NEEDLE) ×1 IMPLANT
NDL SAFETY ECLIPSE 18X1.5 (NEEDLE) IMPLANT
NEEDLE HYPO 18GX1.5 SHARP (NEEDLE)
NEEDLE HYPO 25X1 1.5 SAFETY (NEEDLE) ×2 IMPLANT
NS IRRIG 1000ML POUR BTL (IV SOLUTION) ×2 IMPLANT
PACK BASIN DAY SURGERY FS (CUSTOM PROCEDURE TRAY) ×2 IMPLANT
PENCIL BUTTON HOLSTER BLD 10FT (ELECTRODE) ×2 IMPLANT
SLEEVE SCD COMPRESS KNEE MED (MISCELLANEOUS) ×2 IMPLANT
SPONGE LAP 4X18 RFD (DISPOSABLE) ×3 IMPLANT
SUT MNCRL AB 4-0 PS2 18 (SUTURE) ×2 IMPLANT
SUT VICRYL 3-0 CR8 SH (SUTURE) ×3 IMPLANT
SYR CONTROL 10ML LL (SYRINGE) ×2 IMPLANT
TOWEL GREEN STERILE FF (TOWEL DISPOSABLE) ×2 IMPLANT
TOWEL OR NON WOVEN STRL DISP B (DISPOSABLE) ×1 IMPLANT
TUBE CONNECTING 20X1/4 (TUBING) ×2 IMPLANT
YANKAUER SUCT BULB TIP NO VENT (SUCTIONS) ×2 IMPLANT

## 2018-09-29 NOTE — Discharge Instructions (Signed)
[facility] °Office Phone Number [phone] ° °BREAST BIOPSY/ PARTIAL MASTECTOMY: POST OP INSTRUCTIONS ° °Always review your discharge instruction sheet given to you by the facility where your surgery was performed. ° °IF YOU HAVE DISABILITY OR FAMILY LEAVE FORMS, YOU MUST BRING THEM TO THE OFFICE FOR PROCESSING.  DO NOT GIVE THEM TO YOUR DOCTOR. ° °1. A prescription for pain medication may be given to you upon discharge.  Take your pain medication as prescribed, if needed.  If narcotic pain medicine is not needed, then you may take acetaminophen (Tylenol) or ibuprofen (Advil) as needed. °2. Take your usually prescribed medications unless otherwise directed °3. If you need a refill on your pain medication, please contact your pharmacy.  They will contact our office to request authorization.  Prescriptions will not be filled after 5pm or on week-ends. °4. You should eat very light the first 24 hours after surgery, such as soup, crackers, pudding, etc.  Resume your normal diet the day after surgery. °5. Most patients will experience some swelling and bruising in the breast.  Ice packs and a good support bra will help.  Swelling and bruising can take several days to resolve.  °6. It is common to experience some constipation if taking pain medication after surgery.  Increasing fluid intake and taking a stool softener will usually help or prevent this problem from occurring.  A mild laxative (Milk of Magnesia or Miralax) should be taken according to package directions if there are no bowel movements after 48 hours. °7. Unless discharge instructions indicate otherwise, you may remove your bandages 24-48 hours after surgery, and you may shower at that time.  You may have steri-strips (small skin tapes) in place directly over the incision.  These strips should be left on the skin for 7-10 days.  If your surgeon used skin glue on the incision, you may shower in 24 hours.  The glue will flake off over the  next 2-3 weeks.  Any sutures or staples will be removed at the office during your follow-up visit. °8. ACTIVITIES:  You may resume regular daily activities (gradually increasing) beginning the next day.  Wearing a good support bra or sports bra minimizes pain and swelling.  You may have sexual intercourse when it is comfortable. °a. You may drive when you no longer are taking prescription pain medication, you can comfortably wear a seatbelt, and you can safely maneuver your car and apply brakes. °b. RETURN TO WORK:  ______________________________________________________________________________________ °9. You should see your doctor in the office for a follow-up appointment approximately two weeks after your surgery.  Your doctor’s nurse will typically make your follow-up appointment when she calls you with your pathology report.  Expect your pathology report 2-3 business days after your surgery.  You may call to check if you do not hear from us after three days. °10. OTHER INSTRUCTIONS: _______________________________________________________________________________________________ _____________________________________________________________________________________________________________________________________ °_____________________________________________________________________________________________________________________________________ °_____________________________________________________________________________________________________________________________________ ° °WHEN TO CALL YOUR DOCTOR: °1. Fever over 101.0 °2. Nausea and/or vomiting. °3. Extreme swelling or bruising. °4. Continued bleeding from incision. °5. Increased pain, redness, or drainage from the incision. ° °The clinic staff is available to answer your questions during regular business hours.  Please don’t hesitate to call and ask to speak to one of the nurses for clinical concerns.  If you have a medical emergency, go to the nearest  emergency room or call 911.  A surgeon from [facility] is always on call at the hospital. ° °For further questions, please visit [email or web address]  ° ° ° ° °  Post Anesthesia Home Care Instructions ° °Activity: °Get plenty of rest for the remainder of the day. A responsible individual must stay with you for 24 hours following the procedure.  °For the next 24 hours, DO NOT: °-Drive a car °-Operate machinery °-Drink alcoholic beverages °-Take any medication unless instructed by your physician °-Make any legal decisions or sign important papers. ° °Meals: °Start with liquid foods such as gelatin or soup. Progress to regular foods as tolerated. Avoid greasy, spicy, heavy foods. If nausea and/or vomiting occur, drink only clear liquids until the nausea and/or vomiting subsides. Call your physician if vomiting continues. ° °Special Instructions/Symptoms: °Your throat may feel dry or sore from the anesthesia or the breathing tube placed in your throat during surgery. If this causes discomfort, gargle with warm salt water. The discomfort should disappear within 24 hours. ° °If you had a scopolamine patch placed behind your ear for the management of post- operative nausea and/or vomiting: ° °1. The medication in the patch is effective for 72 hours, after which it should be removed.  Wrap patch in a tissue and discard in the trash. Wash hands thoroughly with soap and water. °2. You may remove the patch earlier than 72 hours if you experience unpleasant side effects which may include dry mouth, dizziness or visual disturbances. °3. Avoid touching the patch. Wash your hands with soap and water after contact with the patch. °  ° °

## 2018-09-29 NOTE — Transfer of Care (Signed)
Immediate Anesthesia Transfer of Care Note  Patient: Ashley Clark  Procedure(s) Performed: LEFT BREAST LUMPECTOMY WITH RADIOACTIVE SEED AND SENTINEL LYMPH NODE BIOPSY (Left Breast)  Patient Location: PACU  Anesthesia Type:General  Level of Consciousness: awake, alert  and oriented  Airway & Oxygen Therapy: Patient Spontanous Breathing and Patient connected to face mask oxygen  Post-op Assessment: Report given to RN and Post -op Vital signs reviewed and stable  Post vital signs: Reviewed and stable  Last Vitals:  Vitals Value Taken Time  BP 116/71 09/29/2018 11:00 AM  Temp    Pulse 84 09/29/2018 11:02 AM  Resp 12 09/29/2018 11:02 AM  SpO2 100 % 09/29/2018 11:02 AM  Vitals shown include unvalidated device data.  Last Pain:  Vitals:   09/29/18 0824  TempSrc: Oral  PainSc: 0-No pain         Complications: No apparent anesthesia complications

## 2018-09-29 NOTE — Anesthesia Preprocedure Evaluation (Signed)
Anesthesia Evaluation  Patient identified by MRN, date of birth, ID band Patient awake    Reviewed: Allergy & Precautions, NPO status , Patient's Chart, lab work & pertinent test results  Airway Mallampati: II  TM Distance: >3 FB Neck ROM: Full    Dental  (+) Dental Advisory Given, Teeth Intact   Pulmonary neg pulmonary ROS, former smoker,    Pulmonary exam normal breath sounds clear to auscultation       Cardiovascular negative cardio ROS Normal cardiovascular exam Rhythm:Regular Rate:Normal     Neuro/Psych negative neurological ROS  negative psych ROS   GI/Hepatic negative GI ROS, Neg liver ROS,   Endo/Other  negative endocrine ROS  Renal/GU negative Renal ROS     Musculoskeletal negative musculoskeletal ROS (+)   Abdominal (+) + obese,   Peds  Hematology negative hematology ROS (+)   Anesthesia Other Findings   Reproductive/Obstetrics                             Anesthesia Physical Anesthesia Plan  ASA: II  Anesthesia Plan: General   Post-op Pain Management: GA combined w/ Regional for post-op pain   Induction: Intravenous  PONV Risk Score and Plan: Ondansetron, Dexamethasone, Midazolam and Propofol infusion  Airway Management Planned: LMA  Additional Equipment:   Intra-op Plan:   Post-operative Plan: Extubation in OR  Informed Consent: I have reviewed the patients History and Physical, chart, labs and discussed the procedure including the risks, benefits and alternatives for the proposed anesthesia with the patient or authorized representative who has indicated his/her understanding and acceptance.   Dental advisory given  Plan Discussed with: CRNA  Anesthesia Plan Comments:         Anesthesia Quick Evaluation

## 2018-09-29 NOTE — Anesthesia Procedure Notes (Signed)
Anesthesia Regional Block: Pectoralis block   Pre-Anesthetic Checklist: ,, timeout performed, Correct Patient, Correct Site, Correct Laterality, Correct Procedure, Correct Position, site marked, Risks and benefits discussed,  Surgical consent,  Pre-op evaluation,  At surgeon's request and post-op pain management  Laterality: Left  Prep: chloraprep       Needles:   Needle Type: Stimiplex     Needle Length: 9cm      Additional Needles:   Procedures:,,,, ultrasound used (permanent image in chart),,,,  Narrative:  Start time: 09/29/2018 9:04 AM End time: 09/29/2018 9:09 AM Injection made incrementally with aspirations every 5 mL.  Performed by: Personally  Anesthesiologist: Nolon Nations, MD  Additional Notes: Patient tolerated well. Good fascial spread noted.

## 2018-09-29 NOTE — Anesthesia Postprocedure Evaluation (Signed)
Anesthesia Post Note  Patient: Ashley Clark  Procedure(s) Performed: LEFT BREAST LUMPECTOMY WITH RADIOACTIVE SEED AND SENTINEL LYMPH NODE BIOPSY (Left Breast)     Patient location during evaluation: PACU Anesthesia Type: General Level of consciousness: sedated and patient cooperative Pain management: pain level controlled Vital Signs Assessment: post-procedure vital signs reviewed and stable Respiratory status: spontaneous breathing Cardiovascular status: stable Anesthetic complications: no    Last Vitals:  Vitals:   09/29/18 1130 09/29/18 1209  BP: 114/75 123/80  Pulse: 77 72  Resp: (!) 21 18  Temp:  36.5 C  SpO2: 97% 96%    Last Pain:  Vitals:   09/29/18 1209  TempSrc:   PainSc: 0-No pain                 Nolon Nations

## 2018-09-29 NOTE — Interval H&P Note (Signed)
History and Physical Interval Note:  09/29/2018 9:24 AM  Ashley Clark  has presented today for surgery, with the diagnosis of LEFT BREAST CANCER  The various methods of treatment have been discussed with the patient and family. After consideration of risks, benefits and other options for treatment, the patient has consented to  Procedure(s): BREAST LUMPECTOMY WITH RADIOACTIVE SEED AND SENTINEL LYMPH NODE BIOPSY (Left) as a surgical intervention .  The patient's history has been reviewed, patient examined, no change in status, stable for surgery.  I have reviewed the patient's chart and labs.  Questions were answered to the patient's satisfaction.     [place]

## 2018-09-29 NOTE — H&P (Signed)
Ashley Clark  Location: [facility] Patient #: [id number] DOB: 1961-10-23 Married / Language: English / Race: Black or African American Female  History of Present Illness  Patient words: Pt sent at the request of Dr Jeanmarie Plant for abnormal screening mammogram. Pt found to have 2 adjacent masses 1 cm and 2 cm from nipple lower outer and inner quadrant. No history of pain discharge or mass.                             ADDITIONAL INFORMATION: 1. PROGNOSTIC INDICATORS Results: IMMUNOHISTOCHEMICAL AND MORPHOMETRIC ANALYSIS PERFORMED MANUALLY The tumor cells are Neagitive for Her2 (1+). Estrogen Receptor: 90%, POSITIVE, STRONG STAINING INTENSITY Progesterone Receptor: 95%, POSITIVE, STRONG STAINING INTENSITY Proliferation Marker Ki67: 20% REFERENCE RANGE ESTROGEN RECEPTOR NEGATIVE 0% POSITIVE =>1% REFERENCE RANGE PROGESTERONE RECEPTOR NEGATIVE 0% POSITIVE =>1% All controls stained appropriately Vicente Males MD Pathologist, Electronic Signature ( Signed 08/06/2018) FINAL DIAGNOSIS Diagnosis 1. 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 2. Breast, left, needle core biopsy, 9:30 o'clock, 1 cm fn (coil clip) - PSEUDOANGIOMATOUS STROMAL HYPERPLASIA ([place]). 1 of 3 FINAL for Ashley Clark, Ashley Clark ([id number]) Diagnosis(continued) - FIBROCYSTIC CHANGE AND CALCIFICATIONS. - NO MALIGNANCY IDENTIFIED. 3. Lymph node, needle/core biopsy, left axilla - BENIGN LYMPH NODE WITH DERMATOPATHIC CHANGE. - NO MALIGNANCY IDENTIFIED. Microscopic Comment 1. The carcinoma appears grade 2-3. Prognostic markers will be ordered. Dr. Lyndon Code has reviewed the case. The case was called to [facility] on 08/05/2018. Vicente Males MD Pathologist, Electronic Signature (Case signed 08/05/2018)    ADDENDUM REPORT: 08/04/2018 15:56  ADDENDUM: Correction to the laterality in the body of the original report: all imaging physical exam findings are in the left breast. Electronically Signed By: Ammie Ferrier M.D. On: 08/04/2018 15:56 Addended by Jamie Kato, MD on 08/04/2018 3:59 PM  Study Result CLINICAL DATA: Screening recall for a left breast possible mass. Patient has family history of breast cancer in her mother at age 73. EXAM: DIGITAL DIAGNOSTIC LEFT MAMMOGRAM WITH CAD AND TOMO ULTRASOUND LEFT BREAST COMPARISON: Previous exam(s). ACR Breast Density Category c: The breast tissue is heterogeneously dense, which may obscure small masses. FINDINGS: On the spot compression tomosynthesis images of the medial left breast there is a persistent subtle asymmetry without a correlate on the true lateral view. The abnormality appears more apparent on the screening full paddle CC image and appears to have some associated distortion. Mammographic images were processed with CAD. On physical exam, there is a subtle palpable lump in the upper inner quadrant of the right breast. Ultrasound of the right breast at 9:30, 2 cm from the nipple demonstrates an irregular hypoechoic vascular mass measuring 1.2 x 1.2 x 1.1 cm. Closer to the nipple at 9:30, 1 cm from the nipple there is a smaller irregular hypoechoic mass measuring 0.8 x 0.5 x 0.6 cm. The larger and the smaller mass together span 3.6 cm, and they lie approximately 1.5 cm apart. Ultrasound of the left axilla demonstrates 1 lymph node with a cortex measuring up to 6 mm. There are 2 other prominent lymph nodes with cortices measuring approximately 4 mm. IMPRESSION: 1. There are 2 adjacent masses in the left breast at 9:30, 1 and 2 cm from the nipple respectively which are suspicious for malignancy. 2. There is 1 lymph node with cortex measuring 6 mm, and 2 other the  borderline possibly abnormal lymph nodes. RECOMMENDATION: Ultrasound guided biopsy is  recommended for the 2 adjacent masses in the left breast at 9:30 and for the largest of the left axillary lymph nodes. 2. Following biopsy, MRI should be considered given the possible multifocality and the density of the patient's breast tissue. These procedures have been scheduled for 08/04/2018 at 1:45 p.m. I have discussed the findings and recommendations with the patient. Results were also provided in writing at the conclusion of the visit. If applicable, a reminder letter will be sent to the patient regarding the next appointment. BI-RADS CATEGORY 5: Highly suggestive of malignancy. Electronically Signed: By: Ammie Ferrier M.D. On: 08/02/2018 09:52  Result History  MM DIAG BREAST TOMO UNI LEFT (Order #[id number]) on 08/04/2018 - Order Result History Report - Result Edited <[email or web address]  MM 3D SCREEN BREAST BILATERAL (Order [id number]) Study Result  CLINICAL DATA: Screening. EXAM: DIGITAL SCREENING BILATERAL MAMMOGRAM WITH TOMO AND CAD COMPARISON: Previous exam(s). ACR Breast Density Category c: The breast tissue is heterogeneously dense, which may obscure small masses. FINDINGS: In the left breast, a possible mass warrants further evaluation. In the right breast, no findings suspicious for malignancy. Images were processed with CAD. IMPRESSION: Further evaluation is suggested for possible mass in the left breast. RECOMMENDATION: Diagnostic mammogram and possibly ultrasound of the left breast. (Code:[id number]) The patient will be contacted regarding the findings, and additional imaging will be scheduled. BI-RADS CATEGORY 0: Incomplete. Need additional imaging evaluation and/or prior mammograms for comparison.    Specimen Gross and Clinical Information.  The patient is a 57 year old female.   Past Surgical History  No pertinent past surgical history  Diagnostic Studies History  Colonoscopy 1-5 years ago Mammogram within last year Pap  Smear 1-5 years ago  Allergies  No Known Drug Allergies  Allergies Reconciled  Medication History  Allergia-C (12.5MG/5ML Elixir, Oral) Active. Medications Reconciled  Social History  Alcohol use Occasional alcohol use. Caffeine use Coffee. No drug use Tobacco use Former smoker.  Family History  Breast Cancer Mother. Diabetes Mellitus Family Members In General.  Pregnancy / Birth History  Age at menarche 44 years. Age of menopause 51-55 Contraceptive History Oral contraceptives. Gravida 1 Irregular periods Maternal age 28-20 Para 1     Review of System General Not Present- Appetite Loss, Chills, Fatigue, Fever, Night Sweats, Weight Gain and Weight Loss. Skin Not Present- Change in Wart/Mole, Dryness, Hives, Jaundice, New Lesions, Non-Healing Wounds, Rash and Ulcer. HEENT Not Present- Earache, Hearing Loss, Hoarseness, Nose Bleed, Oral Ulcers, Ringing in the Ears, Seasonal Allergies, Sinus Pain, Sore Throat, Visual Disturbances, Wears glasses/contact lenses and Yellow Eyes. Respiratory Not Present- Bloody sputum, Chronic Cough, Difficulty Breathing, Snoring and Wheezing. Breast Not Present- Breast Mass, Breast Pain, Nipple Discharge and Skin Changes. Cardiovascular Not Present- Chest Pain, Difficulty Breathing Lying Down, Leg Cramps, Palpitations, Rapid Heart Rate, Shortness of Breath and Swelling of Extremities. Gastrointestinal Not Present- Abdominal Pain, Bloating, Bloody Stool, Change in Bowel Habits, Chronic diarrhea, Constipation, Difficulty Swallowing, Excessive gas, Gets full quickly at meals, Hemorrhoids, Indigestion, Nausea, Rectal Pain and Vomiting. Female Genitourinary Not Present- Frequency, Nocturia, Painful Urination, Pelvic Pain and Urgency. Musculoskeletal Not Present- Back Pain, Joint Pain, Joint Stiffness, Muscle Pain, Muscle Weakness and Swelling of Extremities. Neurological Not Present- Decreased Memory, Fainting, Headaches,  Numbness, Seizures, Tingling, Tremor, Trouble walking and Weakness. Psychiatric Not Present- Anxiety, Bipolar, Change in Sleep Pattern, Depression, Fearful and Frequent crying. Endocrine Not Present- Cold Intolerance, Excessive Hunger, Hair Changes, Heat Intolerance, Hot flashes and  New Diabetes. Hematology Not Present- Blood Thinners, Easy Bruising, Excessive bleeding, Gland problems, HIV and Persistent Infections. All other systems negative  Vitals  08/09/2018 9:38 AM Weight: 206.25 lb Height: 61in Body Surface Area: 1.91 m Body Mass Index: 38.97 kg/m  Temp.: 98.72F(Oral)  Pulse: 80 (Regular)  BP: 128/82 (Sitting, Left Arm, Standard)      Physical Exam   General Mental Status-Alert. General Appearance-Consistent with stated age. Hydration-Well hydrated. Voice-Normal.  Head and Neck Head-normocephalic, atraumatic with no lesions or palpable masses. Trachea-midline. Thyroid Gland Characteristics - normal size and consistency.  Eye Eyeball - Bilateral-Extraocular movements intact. Sclera/Conjunctiva - Bilateral-No scleral icterus.  Chest and Lung Exam Chest and lung exam reveals -quiet, even and easy respiratory effort with no use of accessory muscles and on auscultation, normal breath sounds, no adventitious sounds and normal vocal resonance. Inspection Chest Wall - Normal. Back - normal.  Breast Breast - Left-Symmetric, Non Tender, No Biopsy scars, no Dimpling, No Inflammation, No Lumpectomy scars, No Mastectomy scars, No Peau d' Orange. Breast - Right-Symmetric, Non Tender, No Biopsy scars, no Dimpling, No Inflammation, No Lumpectomy scars, No Mastectomy scars, No Peau d' Orange. Breast Lump-No Palpable Breast Mass.  Cardiovascular Cardiovascular examination reveals -normal heart sounds, regular rate and rhythm with no murmurs and normal pedal pulses bilaterally.  Musculoskeletal Normal Exam - Left-Upper  Extremity Strength Normal and Lower Extremity Strength Normal. Normal Exam - Right-Upper Extremity Strength Normal and Lower Extremity Strength Normal.  Lymphatic Head & Neck  General Head & Neck Lymphatics: Bilateral - Description - Normal. Axillary  General Axillary Region: Bilateral - Description - Normal. Tenderness - Non Tender.    Assessment & Plan   BREAST CANCER, LEFT (C50.912) Impression: MRI consider breast conservation if able very dense breast with multicentric disease so far may need mastectomy reconstruction  refer to medical and radiation oncology and genetics   MALIGNANT NEOPLASM OF UPPER-OUTER QUADRANT OF LEFT BREAST IN FEMALE, ESTROGEN RECEPTOR POSITIVE (C50.412)  Current Plans You are being scheduled for surgery- Our schedulers will call you.  You should hear from our office's scheduling department within 5 working days about the location, date, and time of surgery. We try to make accommodations for patient's preferences in scheduling surgery, but sometimes the OR schedule or the surgeon's schedule prevents [place] from making those accommodations.  If you have not heard from our office [phone]) in 5 working days, call the office and ask for your surgeon's nurse.  If you have other questions about your diagnosis, plan, or surgery, call the office and ask for your surgeon's nurse.  Pt Education - [facility] Breast Cancer Information Given - [facility] "Breast Journey" Package We discussed the staging and pathophysiology of breast cancer. We discussed all of the different options for treatment for breast cancer including surgery, chemotherapy, radiation therapy, Herceptin, and antiestrogen therapy. We discussed a sentinel lymph node biopsy as she does not appear to having lymph node involvement right now. We discussed the performance of that with injection of radioactive tracer and blue dye. We discussed that she would have an incision underneath her  axillary hairline. We discussed that there is a bout a 10-20% chance of having a positive node with a sentinel lymph node biopsy and we will await the permanent pathology to make any other first further decisions in terms of her treatment. One of these options might be to return to the operating room to perform an axillary lymph node dissection. We discussed about a 1-2% risk lifetime of chronic shoulder  pain as well as lymphedema associated with a sentinel lymph node biopsy. We discussed the options for treatment of the breast cancer which included lumpectomy versus a mastectomy. We discussed the performance of the lumpectomy with a wire placement. We discussed a 10-20% chance of a positive margin requiring reexcision in the operating room. We also discussed that she may need radiation therapy or antiestrogen therapy or both if she undergoes lumpectomy. We discussed the mastectomy and the postoperative care for that as well. We discussed that there is no difference in her survival whether she undergoes lumpectomy with radiation therapy or antiestrogen therapy versus a mastectomy. There is a slight difference in the local recurrence rate being 3-5% with lumpectomy and about 1% with a mastectomy. We discussed the risks of operation including bleeding, infection, possible reoperation. She understands her further therapy will be based on what her stages at the time of her operation.  Pt Education - [facility] breast cancer surgery: discussed with patient and provided information. Pt Education - [facility] Mastectomy HCI Pt Education - [facility] Breast Biopsy HCI: discussed with patient and provided information. Pt Education - ABC (After Breast Cancer) Class Info: discussed with patient and provided information.

## 2018-09-29 NOTE — Op Note (Signed)
Preoperative diagnosis: Stage I left breast cancer central  Postoperative diagnosis: Same  Procedure: Left breast seed localized lumpectomy with left axillary sentinel lymph node mapping of deep left axillary sentinel nodes  Surgeon: Thomas Cornett, MD  Anesthesia: General with pectoral block and local  EBL: 10 cc  Specimen: Left breast tissue with 2 seeds and 3 clips verified by Faxitron with grossly negative margins and 2 left axillary sentinel nodes hot  Drains: None  IV fluids: Per anesthesia record  Indications for procedure: The patient presents for left breast lumpectomy for her central stage I left breast cancer.  There are 2 biopsies done adjacent to the tumor and there are 2 seeds and 3's clips in the breast.  She opted for breast conservation.  Risk benefits and alternative therapies were discussed.  She was seen in the multidisciplinary breast clinic by medical and radiation oncology as well.The procedure has been discussed with the patient. Alternatives to surgery have been discussed with the patient.  Risks of surgery include bleeding,  Infection,  Seroma formation, death,  and the need for further surgery.   The patient understands and wishes to proceed.Sentinel lymph node mapping and dissection has been discussed with the patient.  Risk of bleeding,  Infection,  Seroma formation,  Additional procedures,,  Shoulder weakness ,  Shoulder stiffness,  Nerve and blood vessel injury and reaction to the mapping dyes have been discussed.  Alternatives to surgery have been discussed with the patient.  The patient agrees to proceed.    Description of procedure: The patient was met in the holding area.  The left side was marked as the correct side and the neoprobe was used to verify seed location.  She underwent pectoral block by anesthesia.  She underwent injection of technetium sulfur colloid by radiology.  All questions were answered.  She was taken back to the operating room placed  supine upon the operating room table.  After induction of general anesthesia, the left breast was prepped and draped in sterile fashion.  Timeout was done.  She received appropriate preoperative antibodies.  Neoprobe was used and films were available for review.  Both seeds were located with the neoprobe in the upper central left breast.  Curvilinear incision was made along the superior border of the nipple areolar complex.  All tissue around both seeds in clips were excised with grossly negative margins.  Faxitron image revealed both seeds and 3 clips to be in the specimen.  This was sent to pathology.  Wound was irrigated and closed with 3-0 Vicryl and 4-0 Monocryl.  The neoprobe settings were changed to technetium.  Hot spot identified in the left axilla.  A 4 cm incision was made in the left axilla.  Dissection was carried down into the deep left axillary contents.  There were 2 hot sentinel nodes which are level 1 sentinel nodes that were excised without difficulty.  Background counts approached baseline.  The long thoracic nerve, thoracodorsal trunk and axillary vein were all preserved.  The wound was irrigated and closed with 3-0 Vicryl and 4-0 Monocryl.  Local anesthetic of 0.5% Sensorcaine was infiltrated into the wound.  Dermabond applied.  All final counts found to be correct.  Breast binder placed.  The patient was awoke extubated taken to recovery in satisfactory condition. 

## 2018-09-29 NOTE — Progress Notes (Signed)
Assisted Dr. Germeroth with left, ultrasound guided, pectoralis block. Side rails up, monitors on throughout procedure. See vital signs in flow sheet. Tolerated Procedure well. 

## 2018-09-30 ENCOUNTER — Encounter (HOSPITAL_BASED_OUTPATIENT_CLINIC_OR_DEPARTMENT_OTHER): Payer: Self-pay | Admitting: Surgery

## 2018-10-01 NOTE — Addendum Note (Signed)
Addendum  created 10/01/18 1225 by Cade Dashner, Ernesta Amble, CRNA   Charge Capture section accepted

## 2018-10-06 ENCOUNTER — Inpatient Hospital Stay
Payer: No Typology Code available for payment source | Attending: Hematology and Oncology | Admitting: Hematology and Oncology

## 2018-10-06 ENCOUNTER — Telehealth: Payer: Self-pay | Admitting: *Deleted

## 2018-10-06 DIAGNOSIS — Z170 Estrogen receptor positive status [ER+]: Secondary | ICD-10-CM | POA: Diagnosis not present

## 2018-10-06 DIAGNOSIS — C50812 Malignant neoplasm of overlapping sites of left female breast: Secondary | ICD-10-CM | POA: Diagnosis present

## 2018-10-06 NOTE — Assessment & Plan Note (Signed)
09/29/2018 left lumpectomy: Grade 3 IDC, 1.7 cm, margins negative, lymphovascular invasion present, 0/2 lymph nodes negative, T1c N0 stage Ia, ER 90%, PR 95%, HER-2 negative, Ki-67 20%  Pathology counseling: I discussed the final pathology report of the patient provided  a copy of this report. I discussed the margins as well as lymph node surgeries. We also discussed the final staging along with previously performed ER/PR and HER-2/neu testing.  Recommendation: 1. Oncotype DX testing to determine if chemotherapy would be of any benefit followed by 2. Adjuvant radiation therapy followed by 3. Adjuvant antiestrogen therapy  Return to clinic based upon Oncotype DX testing

## 2018-10-06 NOTE — Progress Notes (Signed)
Patient Care Team: Cleda Mccreedy as PCP - General (Nurse Practitioner)  DIAGNOSIS:  Encounter Diagnosis  Name Primary?  . Malignant neoplasm of overlapping sites of left breast in female, estrogen receptor positive ([place])     SUMMARY OF ONCOLOGIC HISTORY:   Malignant neoplasm of overlapping sites of left breast in female, estrogen receptor positive ([place])   08/04/2018 Initial Diagnosis    Screening mammogram detected left breast mass at 9:30 position 1.2 x 1.2 x 1.1 cm.  Closer to the nipple at 9:30 position 0.8 cm lesion span 3.6 cm and a 1.5 cm apart.  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 position 2 cm from nipple, at 1 cm from nipple PASH, lymph node benign, ER 90%, PR 95%, Ki-67 20%, HER-2 negative by IHC 1+, T1CN0 stage Ia AJCC 8    09/29/2018 Surgery    Left lumpectomy: Grade 3 IDC, 1.7 cm, margins negative, lymphovascular invasion present, 0/2 lymph nodes negative, T1c N0 stage Ia    10/06/2018 Cancer Staging    Staging form: Breast, AJCC 8th Edition - Pathologic: Stage IA (pT1c, pN0(sn), cM0, G3, ER+, PR+, HER2-) - Signed by Nicholas Lose, MD on 10/06/2018     CHIEF COMPLIANT: Follow-up after recent left lumpectomy  INTERVAL HISTORY: Ashley Clark is a 57-year with above-mentioned history of left breast cancer underwent left lumpectomy and is here today to discuss pathology report.  She is recovering very well from recent surgery.  Denies any major pain or discomfort.  REVIEW OF SYSTEMS:   Constitutional: Denies fevers, chills or abnormal weight loss Eyes: Denies blurriness of vision Ears, nose, mouth, throat, and face: Denies mucositis or sore throat Respiratory: Denies cough, dyspnea or wheezes Cardiovascular: Denies palpitation, chest discomfort Gastrointestinal:  Denies nausea, heartburn or change in bowel habits Skin: Denies abnormal skin rashes Lymphatics: Denies new  lymphadenopathy or easy bruising Neurological:Denies numbness, tingling or new weaknesses Behavioral/Psych: Mood is stable, no new changes  Extremities: No lower extremity edema Breast: Recent left lumpectomy All other systems were reviewed with the patient and are negative.  I have reviewed the past medical history, past surgical history, social history and family history with the patient and they are unchanged from previous note.  ALLERGIES:  has No Known Allergies.  MEDICATIONS:  Current Outpatient Medications  Medication Sig Dispense Refill  . cetirizine (ZYRTEC) 10 MG chewable tablet Chew 10 mg by mouth daily.    Marland Kitchen ibuprofen (ADVIL,MOTRIN) 800 MG tablet Take 1 tablet (800 mg total) by mouth every 8 (eight) hours as needed. 30 tablet 0  . oxyCODONE (OXY IR/ROXICODONE) 5 MG immediate release tablet Take 1 tablet (5 mg total) by mouth every 6 (six) hours as needed for severe pain. 10 tablet 0   No current facility-administered medications for this visit.     PHYSICAL EXAMINATION: ECOG PERFORMANCE STATUS: 1 - Symptomatic but completely ambulatory  Vitals:   10/06/18 0911  BP: 110/66  Pulse: 63  Resp: 18  Temp: 97.9 F (36.6 C)  SpO2: 99%   Filed Weights   10/06/18 0911  Weight: 208 lb (94.3 kg)    GENERAL:alert, no distress and comfortable SKIN: skin color, texture, turgor are normal, no rashes or significant lesions EYES: normal, Conjunctiva are pink and non-injected, sclera clear OROPHARYNX:no exudate, no erythema and lips, buccal mucosa, and tongue normal  NECK: supple, thyroid normal size, non-tender, without nodularity LYMPH:  no palpable  lymphadenopathy in the cervical, axillary or inguinal LUNGS: clear to auscultation and percussion with normal breathing effort HEART: regular rate & rhythm and no murmurs and no lower extremity edema ABDOMEN:abdomen soft, non-tender and normal bowel sounds MUSCULOSKELETAL:no cyanosis of digits and no clubbing  NEURO: alert &  oriented x 3 with fluent speech, no focal motor/sensory deficits EXTREMITIES: No lower extremity edema   LABORATORY DATA:  I have reviewed the data as listed No flowsheet data found.  Lab Results  Component Value Date   WBC 5.0 09/27/2018   HGB 13.7 09/27/2018   HCT 43.6 09/27/2018   MCV 85.3 09/27/2018   PLT 268 09/27/2018   NEUTROABS 2.6 09/27/2018    ASSESSMENT & PLAN:  Malignant neoplasm of overlapping sites of left breast in female, estrogen receptor positive ([place]) 09/29/2018 left lumpectomy: Grade 3 IDC, 1.7 cm, margins negative, lymphovascular invasion present, 0/2 lymph nodes negative, T1c N0 stage Ia, ER 90%, PR 95%, HER-2 negative, Ki-67 20%  Pathology counseling: I discussed the final pathology report of the patient provided  a copy of this report. I discussed the margins as well as lymph node surgeries. We also discussed the final staging along with previously performed ER/PR and HER-2/neu testing.  Recommendation: 1. Oncotype DX testing to determine if chemotherapy would be of any benefit followed by 2. Adjuvant radiation therapy followed by 3. Adjuvant antiestrogen therapy  Patient's friend Juliann Pulse is also a patient of mine  Return to clinic based upon Oncotype DX testing  No orders of the defined types were placed in this encounter.  The patient has a good understanding of the overall plan. she agrees with it. she will call with any problems that may develop before the next visit here.   Harriette Ohara, MD 10/06/18

## 2018-10-06 NOTE — Telephone Encounter (Signed)
Received order for oncotype testing. Requisition faxed to pathology 

## 2018-10-20 MED FILL — IBUPROFEN 800 MG TAB: 800 | 10 days supply | Qty: 30 | Fill #0

## 2018-10-22 ENCOUNTER — Encounter: Payer: Self-pay | Admitting: Hematology and Oncology

## 2018-10-22 ENCOUNTER — Telehealth: Payer: Self-pay | Admitting: *Deleted

## 2018-10-22 NOTE — Telephone Encounter (Signed)
Received oncotype score of 13/4%. Physician team notified. Called pt with results and discussed chemo is not recommended. Informed next step will be xrt with dr. Lisbeth Renshaw. Received verbal understanding.

## 2018-10-29 ENCOUNTER — Encounter (HOSPITAL_COMMUNITY): Payer: Self-pay | Admitting: Hematology and Oncology

## 2018-10-29 NOTE — Progress Notes (Signed)
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  Did patient present with symptoms (if so, please note symptoms) or was this found on screening mammography?: Patient had abnormal screening mammogram.  2 adjacent masses 1 cm and 2 cm from nipple, lower outer and inner quadrant.  No history of pain, discharge, or mass.  Ultrasound right breast: 9:30, 2cm from the nipple demonstrates an irregular hypoechoic vascular mass measuring 1.2 x 1.2 x 1.1 cm.  Closer to the nipple at 9:30, 1 cm from the nipple there is a smaller irregular hypoechoic mass measuring 0.8 x 0.5 x 0.6 cm.  Left Axilla Ultrasound: 1 lymph node with a cortex measuring up to 6 mm.  There are 2 other prominent lymph nodes with cortices measuring approximately 92m.  Histology per Pathology Report: Left Breast 08/04/2018   Receptor Status: ER(+ 90%), PR (+ 95%), Her2-neu (-), Ki-67(20%)  Past/Anticipated interventions by surgeon, if any: Dr. CBrantley Stage8/13/2019  FINAL DIAGNOSIS Diagnosis 09-29-18 Dr. TMarcello MooresCornett 1. Breast, lumpectomy, Left - INVASIVE DUCTAL CARCINOMA, NOTTINGHAM GRADE 3 OF 3, 1.7 CM. - MARGINS OF RESECTION ARE NOT INVOLVED (CLOSEST MARGIN: 3 MM, ANTERIOR). - LYMPHOVASCULAR INVASION IS PRESENT. - BIOPSY SITE CHANGES. - SEE ONCOLOGY TABLE. 2. Lymph node, sentinel, biopsy, Left axillary - ONE LYMPH NODE, NEGATIVE FOR CARCINOMA (0/1) 3. Lymph node, sentinel, biopsy, Left axillary - ONE LYMPH NODE, NEGATIVE FOR CARCINOMA (0/1). Microscopic Comment 1. INVASIVE CARCINOMA OF THE BREAST: Resection Receptor Status: ER(+ 90%), PR (+ 95%), Her2-neu (-), Ki-67(20%)  -Past/Anticipated interventions by medical oncology, if any: Dr. GLindi Adie  Chemotherapy No  Breast MRI 1. Breast conserving surgery followed by 2. Oncotype DX testing  Result 13 09-29-18 3. Adjuvant radiation therapy followed by 4. Adjuvant antiestrogen  therapy  Lymphedema issues, if any:  No ROM to left arm good Skin to left breast healing with normal color skin intact. Follow up appointment with Dr.Thomas Cornett 10-11-18  Pain issues, if any: No   SAFETY ISSUES:  Prior radiation? No  Pacemaker/ICD? No  Possible current pregnancy? No  Is the patient on methotrexate? No  Age of menarche 89Age of menopause 89Gravida 1 para 1 Age at first childbirth 21 years LMP 06-14-2015     HRT No  Current Complaints / other details Wt Readings from Last 3 Encounters:  11/04/18 212 lb (96.2 kg)  10/06/18 208 lb (94.3 kg)  09/29/18 206 lb 9.1 oz (93.7 kg)  BP 123/73 (BP Location: Right Arm, Patient Position: Sitting)   Pulse 81   Temp 98.4 F (36.9 C) (Oral)   Resp 18   Ht '5\' 2"'  (1.575 m)   Wt 212 lb (96.2 kg)   LMP 03/12/2015   SpO2 97%   BMI 38.78 kg/m

## 2018-11-04 ENCOUNTER — Encounter: Payer: Self-pay | Admitting: Radiation Oncology

## 2018-11-04 ENCOUNTER — Ambulatory Visit
Admission: RE | Admit: 2018-11-04 | Discharge: 2018-11-04 | Disposition: A | Discharge status: Home / Self Care | Payer: No Typology Code available for payment source | Source: Ambulatory Visit | Attending: Radiation Oncology | Admitting: Radiation Oncology

## 2018-11-04 ENCOUNTER — Other Ambulatory Visit: Payer: Self-pay

## 2018-11-04 VITALS — BP 123/73 | HR 81 | Temp 98.4°F | Resp 18 | Ht 62.0 in | Wt 212.0 lb

## 2018-11-04 DIAGNOSIS — Z170 Estrogen receptor positive status [ER+]: Secondary | ICD-10-CM | POA: Insufficient documentation

## 2018-11-04 DIAGNOSIS — Z510 Encounter for antineoplastic radiation therapy: Secondary | ICD-10-CM | POA: Diagnosis not present

## 2018-11-04 DIAGNOSIS — C50812 Malignant neoplasm of overlapping sites of left female breast: Secondary | ICD-10-CM | POA: Insufficient documentation

## 2018-11-04 DIAGNOSIS — Z87891 Personal history of nicotine dependence: Secondary | ICD-10-CM | POA: Diagnosis not present

## 2018-11-04 NOTE — Progress Notes (Deleted)
Radiation Oncology         (336) [phone] ________________________________  Name: Ashley Clark        MRN: [id number]  Date of Service: 11/04/2018 DOB: 1961/03/06  CM:[id number], Barth Kirks, PA-C  Nicholas Lose, MD     REFERRING PHYSICIAN: Nicholas Lose, MD   DIAGNOSIS: The encounter diagnosis was Malignant neoplasm of overlapping sites of left breast in female, estrogen receptor positive ([place]).   HISTORY OF PRESENT ILLNESS: Ashley Clark is a 57 y.o. female with a recent diagnosis of left breast cancer. The patient was noted to have a screening detected mass in the left breast. She underwent diagnostic imaging which revealed a 1.2 x 1.2 x 1 cm mass at 9:30, as well as a 8 x 6 x 5 mm, and three abnormal appearing nodes in the axilla. 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 She had a biopsy also of one of the abnormal appearing nodes which was negative. Her invasive tumor was ER/PR positive, HER2 negative, with a Ki 67 of 20%.  She did undergo an MRI of the breast on 08/20/2018 given the discrepancy in the ultrasound findings of her left axillary node in the presence of benign pathology.  This revealed a 1.6 x 1.7 x 1.3 cm enhancing mass at the biopsy proven site, no evidence of additional masslike or non-masslike enhancement were identified.  There was a post biopsy clip identified and a prominent low lying level 1 axillary node, remaining axillary lymph nodes have diffuse prominent cortices but were symmetric bilaterally and no additional concerns for lymph node involvement was present.  She did undergo left lumpectomy sentinel node biopsy on 09/29/2018 after going on a vacation.  Final pathology revealed a grade 3 invasive ductal carcinoma measuring 1.7 cm.  Her margins were negative.  LVSI was present.  Of her to sampled nodes, neither contained any  disease, and a postoperative Oncotype DX score was 13.  She comes today to discuss the role of adjuvant radiotherapy.    PREVIOUS RADIATION THERAPY: No   PAST MEDICAL HISTORY:  Past Medical History:  Diagnosis Date  . Medical history non-contributory        PAST SURGICAL HISTORY: Past Surgical History:  Procedure Laterality Date  . BREAST LUMPECTOMY WITH RADIOACTIVE SEED AND SENTINEL LYMPH NODE BIOPSY Left 09/29/2018   Procedure: LEFT BREAST LUMPECTOMY WITH RADIOACTIVE SEED AND SENTINEL LYMPH NODE BIOPSY;  Surgeon: Erroll Luna, MD;  Location: [place];  Service: General;  Laterality: Left;  . WISDOM TOOTH EXTRACTION       FAMILY HISTORY:  Family History  Problem Relation Age of Onset  . Breast cancer Mother 30  . Colon cancer Neg Hx   . Rectal cancer Neg Hx   . Stomach cancer Neg Hx   . Esophageal cancer Neg Hx      SOCIAL HISTORY:  reports that she quit smoking about 3 years ago. She smoked 0.25 packs per day. She has never used smokeless tobacco. She reports that she drinks alcohol. She reports that she does not use drugs. The patient is married and lives in [place]. She's accompanied by her mother who was treated by Dr. Sondra Come. She works for [employer] as a Chartered certified accountant.   ALLERGIES: Patient has no known allergies.   MEDICATIONS:  Current Outpatient Medications  Medication Sig Dispense Refill  .  cetirizine (ZYRTEC) 10 MG chewable tablet Chew 10 mg by mouth daily.    Marland Kitchen ibuprofen (ADVIL,MOTRIN) 800 MG tablet Take 1 tablet (800 mg total) by mouth every 8 (eight) hours as needed. 30 tablet 0  . oxyCODONE (OXY IR/ROXICODONE) 5 MG immediate release tablet Take 1 tablet (5 mg total) by mouth every 6 (six) hours as needed for severe pain. 10 tablet 0   No current facility-administered medications for this encounter.      REVIEW OF SYSTEMS: On review of systems, the patient reports that she is doing well overall.  She denies any chest pain,  shortness of breath, cough, fevers, chills, night sweats, unintended weight changes. She denies any bowel or bladder disturbances, and denies abdominal pain, nausea or vomiting. She denies any new musculoskeletal or joint aches or pains. A complete review of systems is obtained and is otherwise negative.     PHYSICAL EXAM:  Wt Readings from Last 3 Encounters:  10/06/18 208 lb (94.3 kg)  09/29/18 206 lb 9.1 oz (93.7 kg)  08/18/18 206 lb 6.4 oz (93.6 kg)   Temp Readings from Last 3 Encounters:  10/06/18 97.9 F (36.6 C) (Oral)  09/29/18 97.7 F (36.5 C)  08/18/18 98.2 F (36.8 C) (Oral)   BP Readings from Last 3 Encounters:  10/06/18 110/66  09/29/18 123/80  08/18/18 108/73   Pulse Readings from Last 3 Encounters:  10/06/18 63  09/29/18 72  08/18/18 63     In general this is a well appearing African American female in no acute distress. She is alert and oriented x4 and appropriate throughout the examination. HEENT reveals that the patient is normocephalic, atraumatic. EOMs are intact. Skin is intact without any evidence of gross lesions. Cardiopulmonary assessment is negative for acute distress and she exhibits normal effort.  Her left lumpectomy and axillary incision sites are intact without evidence of edema, erythema or separation.   ECOG = 0  0 - Asymptomatic (Fully active, able to carry on all predisease activities without restriction)  1 - Symptomatic but completely ambulatory (Restricted in physically strenuous activity but ambulatory and able to carry out work of a light or sedentary nature. For example, light housework, office work)  2 - Symptomatic, <50% in bed during the day (Ambulatory and capable of all self care but unable to carry out any work activities. Up and about more than 50% of waking hours)  3 - Symptomatic, >50% in bed, but not bedbound (Capable of only limited self-care, confined to bed or chair 50% or more of waking hours)  4 - Bedbound (Completely  disabled. Cannot carry on any self-care. Totally confined to bed or chair)  5 - Death   Eustace Pen MM, Creech RH, Tormey DC, et al. [phone]). "Toxicity and response criteria of the [facility]". [place] Oncol. 5 (6): 649-55    LABORATORY DATA:  Lab Results  Component Value Date   WBC 5.0 09/27/2018   HGB 13.7 09/27/2018   HCT 43.6 09/27/2018   MCV 85.3 09/27/2018   PLT 268 09/27/2018   No results found for: NA, K, CL, CO2 No results found for: ALT, AST, GGT, ALKPHOS, BILITOT    RADIOGRAPHY: No results found.     IMPRESSION/PLAN: 1. Stage IA, pT1cN0M0, grade 3 ER/PR positive, invasive ductal carcinoma of the left breast. Dr. Lisbeth Renshaw discusses the final pathology findings and reviews the nature of invasive breast disease.  She is done well since surgery, and is healing nicely.  Fortunately there  is no need for any systemic chemotherapy, and she is ready to proceed with adjuvant external radiotherapy to the breast followed by antiestrogen therapy. We discussed the risks, benefits, short, and long term effects of radiotherapy, and the patient is interested in proceeding. Dr. Lisbeth Renshaw discusses the delivery and logistics of radiotherapy and recommends a course of 6 1/2 weeks of radiotherapy with deep inspiration breath-hold technique. Written consent is obtained and placed in the chart, a copy was provided to the patient.  She will simulate following this appointment.   In a visit lasting 25 minutes, greater than 50% of the time was spent face to face discussing her case, and coordinating the patient's care.  The above documentation reflects my direct findings during this shared patient visit. Please see the separate note by Dr. Lisbeth Renshaw on this date for the remainder of the patient's plan of care.    Carola Rhine, PAC

## 2018-11-05 NOTE — Progress Notes (Signed)
Radiation Oncology         (336) [phone] ________________________________  Name: Ashley Clark        MRN: [id number]  Date of Service: 11/04/2018 DOB: 1961/12/13  FO:[id number], Barth Kirks, PA-C  Nicholas Lose, MD     REFERRING PHYSICIAN: Nicholas Lose, MD   DIAGNOSIS: The encounter diagnosis was Malignant neoplasm of overlapping sites of left breast in female, estrogen receptor positive ([place]).   HISTORY OF PRESENT ILLNESS: Ashley Clark is a 57 y.o. female with a recent diagnosis of left breast cancer. The patient was noted to have a screening detected mass in the left breast. She underwent diagnostic imaging which revealed a 1.2 x 1.2 x 1 cm mass at 9:30, as well as a 8 x 6 x 5 mm, and three abnormal appearing nodes in the axilla. 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 She had a biopsy also of one of the abnormal appearing nodes which was negative. Her invasive tumor was ER/PR positive, HER2 negative, with a Ki 67 of 20%.  She did undergo an MRI of the breast on 08/20/2018 given the discrepancy in the ultrasound findings of her left axillary node in the presence of benign pathology.  This revealed a 1.6 x 1.7 x 1.3 cm enhancing mass at the biopsy proven site, no evidence of additional masslike or non-masslike enhancement were identified.  There was a post biopsy clip identified and a prominent low lying level 1 axillary node, remaining axillary lymph nodes have diffuse prominent cortices but were symmetric bilaterally and no additional concerns for lymph node involvement was present.  She did undergo left lumpectomy sentinel node biopsy on 09/29/2018 after going on a vacation.  Final pathology revealed a grade 3 invasive ductal carcinoma measuring 1.7 cm.  Her margins were negative.  LVSI was present.  Of her to sampled nodes, neither contained any  disease, and a postoperative Oncotype DX score was 13.  She comes today to discuss the role of adjuvant radiotherapy.    PREVIOUS RADIATION THERAPY: No   PAST MEDICAL HISTORY:  Past Medical History:  Diagnosis Date  . Medical history non-contributory        PAST SURGICAL HISTORY: Past Surgical History:  Procedure Laterality Date  . BREAST LUMPECTOMY WITH RADIOACTIVE SEED AND SENTINEL LYMPH NODE BIOPSY Left 09/29/2018   Procedure: LEFT BREAST LUMPECTOMY WITH RADIOACTIVE SEED AND SENTINEL LYMPH NODE BIOPSY;  Surgeon: Erroll Luna, MD;  Location: [place];  Service: General;  Laterality: Left;  . WISDOM TOOTH EXTRACTION       FAMILY HISTORY:  Family History  Problem Relation Age of Onset  . Breast cancer Mother 47  . Colon cancer Neg Hx   . Rectal cancer Neg Hx   . Stomach cancer Neg Hx   . Esophageal cancer Neg Hx      SOCIAL HISTORY:  reports that she quit smoking about 3 years ago. She smoked 0.25 packs per day. She has never used smokeless tobacco. She reports that she drinks alcohol. She reports that she does not use drugs. The patient is married and lives in [place]. She's accompanied by her mother who was treated by Dr. Sondra Come. She works for [employer] as a Chartered certified accountant.   ALLERGIES: Patient has no known allergies.   MEDICATIONS:  Current Outpatient Medications  Medication Sig Dispense Refill  .  cetirizine (ZYRTEC) 10 MG chewable tablet Chew 10 mg by mouth daily.    Marland Kitchen ibuprofen (ADVIL,MOTRIN) 800 MG tablet Take 1 tablet (800 mg total) by mouth every 8 (eight) hours as needed. 30 tablet 0  . oxyCODONE (OXY IR/ROXICODONE) 5 MG immediate release tablet Take 1 tablet (5 mg total) by mouth every 6 (six) hours as needed for severe pain. 10 tablet 0   No current facility-administered medications for this encounter.      REVIEW OF SYSTEMS: On review of systems, the patient reports that she is doing well overall.  She denies any chest pain,  shortness of breath, cough, fevers, chills, night sweats, unintended weight changes. She denies any bowel or bladder disturbances, and denies abdominal pain, nausea or vomiting. She denies any new musculoskeletal or joint aches or pains. A complete review of systems is obtained and is otherwise negative.     PHYSICAL EXAM:  Wt Readings from Last 3 Encounters:  10/06/18 208 lb (94.3 kg)  09/29/18 206 lb 9.1 oz (93.7 kg)  08/18/18 206 lb 6.4 oz (93.6 kg)   Temp Readings from Last 3 Encounters:  10/06/18 97.9 F (36.6 C) (Oral)  09/29/18 97.7 F (36.5 C)  08/18/18 98.2 F (36.8 C) (Oral)   BP Readings from Last 3 Encounters:  10/06/18 110/66  09/29/18 123/80  08/18/18 108/73   Pulse Readings from Last 3 Encounters:  10/06/18 63  09/29/18 72  08/18/18 63     In general this is a well appearing African American female in no acute distress. She is alert and oriented x4 and appropriate throughout the examination. HEENT reveals that the patient is normocephalic, atraumatic. EOMs are intact. Skin is intact without any evidence of gross lesions. Cardiopulmonary assessment is negative for acute distress and she exhibits normal effort.  Her left lumpectomy and axillary incision sites are intact without evidence of edema, erythema or separation.   ECOG = 0  0 - Asymptomatic (Fully active, able to carry on all predisease activities without restriction)  1 - Symptomatic but completely ambulatory (Restricted in physically strenuous activity but ambulatory and able to carry out work of a light or sedentary nature. For example, light housework, office work)  2 - Symptomatic, <50% in bed during the day (Ambulatory and capable of all self care but unable to carry out any work activities. Up and about more than 50% of waking hours)  3 - Symptomatic, >50% in bed, but not bedbound (Capable of only limited self-care, confined to bed or chair 50% or more of waking hours)  4 - Bedbound (Completely  disabled. Cannot carry on any self-care. Totally confined to bed or chair)  5 - Death   Eustace Pen MM, Creech RH, Tormey DC, et al. [phone]). "Toxicity and response criteria of the [facility]". [place] Oncol. 5 (6): 649-55    LABORATORY DATA:  Lab Results  Component Value Date   WBC 5.0 09/27/2018   HGB 13.7 09/27/2018   HCT 43.6 09/27/2018   MCV 85.3 09/27/2018   PLT 268 09/27/2018   No results found for: NA, K, CL, CO2 No results found for: ALT, AST, GGT, ALKPHOS, BILITOT    RADIOGRAPHY: No results found.     IMPRESSION/PLAN: 1. Stage IA, pT1cN0M0, grade 3 ER/PR positive, invasive ductal carcinoma of the left breast. Dr. Lisbeth Renshaw discusses the final pathology findings and reviews the nature of invasive breast disease.  She is done well since surgery, and is healing nicely.  Fortunately there  is no need for any systemic chemotherapy, and she is ready to proceed with adjuvant external radiotherapy to the breast followed by antiestrogen therapy. We discussed the risks, benefits, short, and long term effects of radiotherapy, and the patient is interested in proceeding. Dr. Lisbeth Renshaw discusses the delivery and logistics of radiotherapy and recommends a course of 6 1/2 weeks of radiotherapy with deep inspiration breath-hold technique. Written consent is obtained and placed in the chart, a copy was provided to the patient.  She will simulate following this appointment.   In a visit lasting 25 minutes, greater than 50% of the time was spent face to face discussing her case, and coordinating the patient's care.    Carola Rhine, PAC

## 2018-11-08 ENCOUNTER — Telehealth: Payer: Self-pay | Admitting: Hematology and Oncology

## 2018-11-08 NOTE — Telephone Encounter (Signed)
Called patient's home number and her husband advised it was okay to call her at work.  Left message regarding 12/30 appt with VG @ 9:15 am following her radiation treatment.

## 2018-11-09 DIAGNOSIS — Z510 Encounter for antineoplastic radiation therapy: Secondary | ICD-10-CM | POA: Diagnosis not present

## 2018-11-09 NOTE — Progress Notes (Signed)
  Radiation Oncology         (336) [phone] ________________________________  Name: Ashley Clark MRN: [id number]  Date: 11/04/2018  DOB: 1961-07-27  Optical Surface Tracking Plan:  Since intensity modulated radiotherapy (IMRT) and 3D conformal radiation treatment methods are predicated on accurate and precise positioning for treatment, intrafraction motion monitoring is medically necessary to ensure accurate and safe treatment delivery.  The ability to quantify intrafraction motion without excessive ionizing radiation dose can only be performed with optical surface tracking. Accordingly, surface imaging offers the opportunity to obtain 3D measurements of patient position throughout IMRT and 3D treatments without excessive radiation exposure.  I am ordering optical surface tracking for this patient's upcoming course of radiotherapy. ________________________________  Kyung Rudd, MD 11/09/2018 6:49 AM    Reference:   Particia Jasper, et al. Surface imaging-based analysis of intrafraction motion for breast radiotherapy patients.Journal of [place], n. 6, nov. 2014. ISSN [id number].   Available at: <http://www.jacmp.org/index.php/jacmp/article/view/4957>.

## 2018-11-09 NOTE — Progress Notes (Signed)
  Radiation Oncology         (336) [phone] ________________________________  Name: Ashley Clark MRN: [id number]  Date: 11/04/2018  DOB: October 20, 1961  DIAGNOSIS:     ICD-10-CM   1. Malignant neoplasm of overlapping sites of left breast in female, estrogen receptor positive ([place]) C50.812    Z17.0      SIMULATION AND TREATMENT PLANNING NOTE  The patient presented for simulation prior to beginning her course of radiation treatment for her diagnosis of left-sided breast cancer. The patient was placed in a supine position on a breast board. A customized vac-lock bag was constructed and this complex treatment device will be used on a daily basis during her treatment. In this fashion, a CT scan was obtained through the chest area and an isocenter was placed near the chest wall within the breast.  The patient will be planned to receive a course of radiation initially to a dose of 50.4 Gy. This will consist of a whole breast radiotherapy technique. To accomplish this, 2 customized blocks have been designed which will correspond to medial and lateral whole breast tangent fields. This treatment will be accomplished at 1.8 Gy per fraction. A forward planning technique will also be evaluated to determine if this approach improves the plan. It is anticipated that the patient will then receive a 10 Gy boost to the seroma cavity which has been contoured. This will be accomplished at 2 Gy per fraction.   This initial treatment will consist of a 3-D conformal technique. The seroma has been contoured as the primary target structure. Additionally, dose volume histograms of both this target as well as the lungs and heart will also be evaluated. Such an approach is necessary to ensure that the target area is adequately covered while the nearby critical  normal structures are adequately spared.  Plan:  The final anticipated total dose therefore will correspond to 60.4 Gy.  Special treatment procedure was performed  today due to the extra time and effort required by myself to plan and prepare this patient for deep inspiration breath hold technique.  I have determined cardiac sparing to be of benefit to this patient to prevent long term cardiac damage due to radiation of the heart.  Bellows were placed on the patient's abdomen. To facilitate cardiac sparing, the patient was coached by the radiation therapists on breath hold techniques and breathing practice was performed. Practice waveforms were obtained. The patient was then scanned while maintaining breath hold in the treatment position.  This image was then transferred over to the imaging specialist. The imaging specialist then created a fusion of the free breathing and breath hold scans using the chest wall as the stable structure. I personally reviewed the fusion in axial, coronal and sagittal image planes.  Excellent cardiac sparing was obtained.  I felt the patient is an appropriate candidate for breath hold and the patient will be treated as such.  The image fusion was then reviewed with the patient to reinforce the necessity of reproducible breath hold.     _______________________________   Jodelle Gross, MD, PhD

## 2018-11-11 ENCOUNTER — Ambulatory Visit
Admission: RE | Admit: 2018-11-11 | Discharge: 2018-11-11 | Disposition: A | Discharge status: Home / Self Care | Payer: No Typology Code available for payment source | Source: Ambulatory Visit | Attending: Radiation Oncology | Admitting: Radiation Oncology

## 2018-11-11 DIAGNOSIS — Z510 Encounter for antineoplastic radiation therapy: Secondary | ICD-10-CM | POA: Diagnosis not present

## 2018-11-12 ENCOUNTER — Ambulatory Visit
Admission: RE | Admit: 2018-11-12 | Discharge: 2018-11-12 | Disposition: A | Discharge status: Home / Self Care | Payer: No Typology Code available for payment source | Source: Ambulatory Visit | Attending: Radiation Oncology | Admitting: Radiation Oncology

## 2018-11-12 DIAGNOSIS — Z510 Encounter for antineoplastic radiation therapy: Secondary | ICD-10-CM | POA: Diagnosis not present

## 2018-11-12 DIAGNOSIS — C50812 Malignant neoplasm of overlapping sites of left female breast: Secondary | ICD-10-CM

## 2018-11-12 DIAGNOSIS — Z170 Estrogen receptor positive status [ER+]: Secondary | ICD-10-CM

## 2018-11-12 MED ORDER — ALRA NON-METALLIC DEODORANT (RAD-ONC)
1.0000 "application " | Freq: Once | TOPICAL | Status: AC
  Administered 2018-11-12: 1 via TOPICAL

## 2018-11-12 MED ORDER — RADIAPLEXRX EX GEL
Freq: Once | CUTANEOUS | Status: AC
  Administered 2018-11-12: 17:00:00 via TOPICAL

## 2018-11-12 NOTE — Progress Notes (Signed)
Pt here for patient teaching.  Pt given Radiation and You booklet, skin care instructions, Alra deodorant and Radiaplex gel.  Reviewed areas of pertinence such as fatigue, hair loss, skin changes, breast tenderness, breast swelling and taste changes . Pt able to give teach back of to pat skin and use unscented/gentle soap,apply Radiaplex bid, avoid applying anything to skin within 4 hours of treatment, avoid wearing an under wire bra and to use an electric razor if they must shave. Pt verbalizes understanding of information given and will contact nursing with any questions or concerns.     Kaleesi Guyton M. Blakelynn Scheeler RN, BSN             

## 2018-11-15 ENCOUNTER — Ambulatory Visit
Admission: RE | Admit: 2018-11-15 | Discharge: 2018-11-15 | Disposition: A | Discharge status: Home / Self Care | Payer: No Typology Code available for payment source | Source: Ambulatory Visit | Attending: Radiation Oncology | Admitting: Radiation Oncology

## 2018-11-15 DIAGNOSIS — Z510 Encounter for antineoplastic radiation therapy: Secondary | ICD-10-CM | POA: Diagnosis not present

## 2018-11-16 ENCOUNTER — Ambulatory Visit
Admission: RE | Admit: 2018-11-16 | Discharge: 2018-11-16 | Disposition: A | Discharge status: Home / Self Care | Payer: No Typology Code available for payment source | Source: Ambulatory Visit | Attending: Radiation Oncology | Admitting: Radiation Oncology

## 2018-11-16 DIAGNOSIS — Z510 Encounter for antineoplastic radiation therapy: Secondary | ICD-10-CM | POA: Diagnosis not present

## 2018-11-17 ENCOUNTER — Ambulatory Visit
Admission: RE | Admit: 2018-11-17 | Discharge: 2018-11-17 | Disposition: A | Discharge status: Home / Self Care | Payer: No Typology Code available for payment source | Source: Ambulatory Visit | Attending: Radiation Oncology | Admitting: Radiation Oncology

## 2018-11-17 DIAGNOSIS — Z510 Encounter for antineoplastic radiation therapy: Secondary | ICD-10-CM | POA: Diagnosis not present

## 2018-11-18 ENCOUNTER — Ambulatory Visit: Payer: No Typology Code available for payment source

## 2018-11-19 ENCOUNTER — Ambulatory Visit: Payer: No Typology Code available for payment source

## 2018-11-22 ENCOUNTER — Ambulatory Visit
Admission: RE | Admit: 2018-11-22 | Discharge: 2018-11-22 | Disposition: A | Discharge status: Home / Self Care | Payer: No Typology Code available for payment source | Source: Ambulatory Visit | Attending: Radiation Oncology | Admitting: Radiation Oncology

## 2018-11-22 DIAGNOSIS — Z510 Encounter for antineoplastic radiation therapy: Secondary | ICD-10-CM | POA: Diagnosis not present

## 2018-11-23 ENCOUNTER — Ambulatory Visit
Admission: RE | Admit: 2018-11-23 | Discharge: 2018-11-23 | Disposition: A | Discharge status: Home / Self Care | Payer: No Typology Code available for payment source | Source: Ambulatory Visit | Attending: Radiation Oncology | Admitting: Radiation Oncology

## 2018-11-23 DIAGNOSIS — Z510 Encounter for antineoplastic radiation therapy: Secondary | ICD-10-CM | POA: Diagnosis not present

## 2018-11-24 ENCOUNTER — Ambulatory Visit
Admission: RE | Admit: 2018-11-24 | Discharge: 2018-11-24 | Disposition: A | Discharge status: Home / Self Care | Payer: No Typology Code available for payment source | Source: Ambulatory Visit | Attending: Radiation Oncology | Admitting: Radiation Oncology

## 2018-11-24 DIAGNOSIS — Z510 Encounter for antineoplastic radiation therapy: Secondary | ICD-10-CM | POA: Diagnosis not present

## 2018-11-29 ENCOUNTER — Ambulatory Visit
Admission: RE | Admit: 2018-11-29 | Discharge: 2018-11-29 | Disposition: A | Discharge status: Home / Self Care | Payer: No Typology Code available for payment source | Source: Ambulatory Visit | Attending: Radiation Oncology | Admitting: Radiation Oncology

## 2018-11-29 DIAGNOSIS — Z170 Estrogen receptor positive status [ER+]: Secondary | ICD-10-CM | POA: Insufficient documentation

## 2018-11-29 DIAGNOSIS — Z79811 Long term (current) use of aromatase inhibitors: Secondary | ICD-10-CM | POA: Diagnosis not present

## 2018-11-29 DIAGNOSIS — C50812 Malignant neoplasm of overlapping sites of left female breast: Secondary | ICD-10-CM | POA: Insufficient documentation

## 2018-11-29 DIAGNOSIS — Z510 Encounter for antineoplastic radiation therapy: Secondary | ICD-10-CM | POA: Insufficient documentation

## 2018-11-30 ENCOUNTER — Ambulatory Visit
Admission: RE | Admit: 2018-11-30 | Discharge: 2018-11-30 | Disposition: A | Discharge status: Home / Self Care | Payer: No Typology Code available for payment source | Source: Ambulatory Visit | Attending: Radiation Oncology | Admitting: Radiation Oncology

## 2018-11-30 DIAGNOSIS — Z510 Encounter for antineoplastic radiation therapy: Secondary | ICD-10-CM | POA: Diagnosis not present

## 2018-12-01 ENCOUNTER — Ambulatory Visit
Admission: RE | Admit: 2018-12-01 | Discharge: 2018-12-01 | Disposition: A | Discharge status: Home / Self Care | Payer: No Typology Code available for payment source | Source: Ambulatory Visit | Attending: Radiation Oncology | Admitting: Radiation Oncology

## 2018-12-01 DIAGNOSIS — Z510 Encounter for antineoplastic radiation therapy: Secondary | ICD-10-CM | POA: Diagnosis not present

## 2018-12-02 ENCOUNTER — Ambulatory Visit
Admission: RE | Admit: 2018-12-02 | Discharge: 2018-12-02 | Disposition: A | Discharge status: Home / Self Care | Payer: No Typology Code available for payment source | Source: Ambulatory Visit | Attending: Radiation Oncology | Admitting: Radiation Oncology

## 2018-12-02 DIAGNOSIS — Z510 Encounter for antineoplastic radiation therapy: Secondary | ICD-10-CM | POA: Diagnosis not present

## 2018-12-03 ENCOUNTER — Ambulatory Visit
Admission: RE | Admit: 2018-12-03 | Discharge: 2018-12-03 | Disposition: A | Discharge status: Home / Self Care | Payer: No Typology Code available for payment source | Source: Ambulatory Visit | Attending: Radiation Oncology | Admitting: Radiation Oncology

## 2018-12-03 DIAGNOSIS — Z510 Encounter for antineoplastic radiation therapy: Secondary | ICD-10-CM | POA: Diagnosis not present

## 2018-12-06 ENCOUNTER — Ambulatory Visit
Admission: RE | Admit: 2018-12-06 | Discharge: 2018-12-06 | Disposition: A | Discharge status: Home / Self Care | Payer: No Typology Code available for payment source | Source: Ambulatory Visit | Attending: Radiation Oncology | Admitting: Radiation Oncology

## 2018-12-06 DIAGNOSIS — Z510 Encounter for antineoplastic radiation therapy: Secondary | ICD-10-CM | POA: Diagnosis not present

## 2018-12-07 ENCOUNTER — Ambulatory Visit
Admission: RE | Admit: 2018-12-07 | Discharge: 2018-12-07 | Disposition: A | Discharge status: Home / Self Care | Payer: No Typology Code available for payment source | Source: Ambulatory Visit | Attending: Radiation Oncology | Admitting: Radiation Oncology

## 2018-12-07 DIAGNOSIS — Z510 Encounter for antineoplastic radiation therapy: Secondary | ICD-10-CM | POA: Diagnosis not present

## 2018-12-08 ENCOUNTER — Ambulatory Visit
Admission: RE | Admit: 2018-12-08 | Discharge: 2018-12-08 | Disposition: A | Discharge status: Home / Self Care | Payer: No Typology Code available for payment source | Source: Ambulatory Visit | Attending: Radiation Oncology | Admitting: Radiation Oncology

## 2018-12-08 DIAGNOSIS — Z510 Encounter for antineoplastic radiation therapy: Secondary | ICD-10-CM | POA: Diagnosis not present

## 2018-12-09 ENCOUNTER — Ambulatory Visit
Admission: RE | Admit: 2018-12-09 | Discharge: 2018-12-09 | Disposition: A | Discharge status: Home / Self Care | Payer: No Typology Code available for payment source | Source: Ambulatory Visit | Attending: Radiation Oncology | Admitting: Radiation Oncology

## 2018-12-09 DIAGNOSIS — Z510 Encounter for antineoplastic radiation therapy: Secondary | ICD-10-CM | POA: Diagnosis not present

## 2018-12-10 ENCOUNTER — Ambulatory Visit
Admission: RE | Admit: 2018-12-10 | Discharge: 2018-12-10 | Disposition: A | Discharge status: Home / Self Care | Payer: No Typology Code available for payment source | Source: Ambulatory Visit | Attending: Radiation Oncology | Admitting: Radiation Oncology

## 2018-12-10 DIAGNOSIS — Z510 Encounter for antineoplastic radiation therapy: Secondary | ICD-10-CM | POA: Diagnosis not present

## 2018-12-13 ENCOUNTER — Ambulatory Visit
Admission: RE | Admit: 2018-12-13 | Discharge: 2018-12-13 | Disposition: A | Discharge status: Home / Self Care | Payer: No Typology Code available for payment source | Source: Ambulatory Visit | Attending: Radiation Oncology | Admitting: Radiation Oncology

## 2018-12-13 DIAGNOSIS — Z510 Encounter for antineoplastic radiation therapy: Secondary | ICD-10-CM | POA: Diagnosis not present

## 2018-12-14 ENCOUNTER — Ambulatory Visit
Admission: RE | Admit: 2018-12-14 | Discharge: 2018-12-14 | Disposition: A | Discharge status: Home / Self Care | Payer: No Typology Code available for payment source | Source: Ambulatory Visit | Attending: Radiation Oncology | Admitting: Radiation Oncology

## 2018-12-14 DIAGNOSIS — Z510 Encounter for antineoplastic radiation therapy: Secondary | ICD-10-CM | POA: Diagnosis not present

## 2018-12-15 ENCOUNTER — Ambulatory Visit
Admission: RE | Admit: 2018-12-15 | Discharge: 2018-12-15 | Disposition: A | Discharge status: Home / Self Care | Payer: No Typology Code available for payment source | Source: Ambulatory Visit | Attending: Radiation Oncology | Admitting: Radiation Oncology

## 2018-12-15 DIAGNOSIS — Z510 Encounter for antineoplastic radiation therapy: Secondary | ICD-10-CM | POA: Diagnosis not present

## 2018-12-16 ENCOUNTER — Ambulatory Visit
Admission: RE | Admit: 2018-12-16 | Discharge: 2018-12-16 | Disposition: A | Discharge status: Home / Self Care | Payer: No Typology Code available for payment source | Source: Ambulatory Visit | Attending: Radiation Oncology | Admitting: Radiation Oncology

## 2018-12-16 DIAGNOSIS — Z510 Encounter for antineoplastic radiation therapy: Secondary | ICD-10-CM | POA: Diagnosis not present

## 2018-12-17 ENCOUNTER — Ambulatory Visit
Admission: RE | Admit: 2018-12-17 | Discharge: 2018-12-17 | Disposition: A | Discharge status: Home / Self Care | Payer: No Typology Code available for payment source | Source: Ambulatory Visit | Attending: Radiation Oncology | Admitting: Radiation Oncology

## 2018-12-17 ENCOUNTER — Ambulatory Visit: Payer: No Typology Code available for payment source | Admitting: Radiation Oncology

## 2018-12-17 DIAGNOSIS — Z510 Encounter for antineoplastic radiation therapy: Secondary | ICD-10-CM | POA: Diagnosis not present

## 2018-12-20 ENCOUNTER — Ambulatory Visit
Admission: RE | Admit: 2018-12-20 | Discharge: 2018-12-20 | Disposition: A | Discharge status: Home / Self Care | Payer: No Typology Code available for payment source | Source: Ambulatory Visit | Attending: Radiation Oncology | Admitting: Radiation Oncology

## 2018-12-20 DIAGNOSIS — Z510 Encounter for antineoplastic radiation therapy: Secondary | ICD-10-CM | POA: Diagnosis not present

## 2018-12-21 ENCOUNTER — Ambulatory Visit
Admission: RE | Admit: 2018-12-21 | Discharge: 2018-12-21 | Disposition: A | Discharge status: Home / Self Care | Payer: No Typology Code available for payment source | Source: Ambulatory Visit | Attending: Radiation Oncology | Admitting: Radiation Oncology

## 2018-12-21 DIAGNOSIS — Z510 Encounter for antineoplastic radiation therapy: Secondary | ICD-10-CM | POA: Diagnosis not present

## 2018-12-23 ENCOUNTER — Ambulatory Visit
Admission: RE | Admit: 2018-12-23 | Discharge: 2018-12-23 | Disposition: A | Discharge status: Home / Self Care | Payer: No Typology Code available for payment source | Source: Ambulatory Visit | Attending: Radiation Oncology | Admitting: Radiation Oncology

## 2018-12-23 DIAGNOSIS — Z510 Encounter for antineoplastic radiation therapy: Secondary | ICD-10-CM | POA: Diagnosis not present

## 2018-12-24 ENCOUNTER — Ambulatory Visit
Admission: RE | Admit: 2018-12-24 | Discharge: 2018-12-24 | Disposition: A | Discharge status: Home / Self Care | Payer: No Typology Code available for payment source | Source: Ambulatory Visit | Attending: Radiation Oncology | Admitting: Radiation Oncology

## 2018-12-24 DIAGNOSIS — Z510 Encounter for antineoplastic radiation therapy: Secondary | ICD-10-CM | POA: Diagnosis not present

## 2018-12-27 ENCOUNTER — Inpatient Hospital Stay
Payer: No Typology Code available for payment source | Attending: Hematology and Oncology | Admitting: Hematology and Oncology

## 2018-12-27 ENCOUNTER — Ambulatory Visit
Admission: RE | Admit: 2018-12-27 | Discharge: 2018-12-27 | Disposition: A | Discharge status: Home / Self Care | Payer: No Typology Code available for payment source | Source: Ambulatory Visit | Attending: Radiation Oncology | Admitting: Radiation Oncology

## 2018-12-27 DIAGNOSIS — C50812 Malignant neoplasm of overlapping sites of left female breast: Secondary | ICD-10-CM

## 2018-12-27 DIAGNOSIS — Z923 Personal history of irradiation: Secondary | ICD-10-CM

## 2018-12-27 DIAGNOSIS — Z170 Estrogen receptor positive status [ER+]: Secondary | ICD-10-CM

## 2018-12-27 DIAGNOSIS — Z510 Encounter for antineoplastic radiation therapy: Secondary | ICD-10-CM | POA: Diagnosis not present

## 2018-12-27 DIAGNOSIS — Z79811 Long term (current) use of aromatase inhibitors: Secondary | ICD-10-CM | POA: Diagnosis not present

## 2018-12-27 MED ORDER — LETROZOLE 2.5 MG PO TABS: 2.5000 mg | ORAL_TABLET | Freq: Every day | ORAL | 3 refills | Status: DC

## 2018-12-27 MED FILL — LETROZOLE 2.5 MG TAB: 2.5 | 90 days supply | Qty: 90 | Fill #0

## 2018-12-27 NOTE — Progress Notes (Signed)
Patient Care Team: Cleda Mccreedy as PCP - General (Nurse Practitioner)  DIAGNOSIS:  Encounter Diagnosis  Name Primary?  . Malignant neoplasm of overlapping sites of left breast in female, estrogen receptor positive ([place])     SUMMARY OF ONCOLOGIC HISTORY:   Malignant neoplasm of overlapping sites of left breast in female, estrogen receptor positive ([place])   08/04/2018 Initial Diagnosis    Screening mammogram detected left breast mass at 9:30 position 1.2 x 1.2 x 1.1 cm.  Closer to the nipple at 9:30 position 0.8 cm lesion span 3.6 cm and a 1.5 cm apart.  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 position 2 cm from nipple, at 1 cm from nipple PASH, lymph node benign, ER 90%, PR 95%, Ki-67 20%, HER-2 negative by IHC 1+, T1CN0 stage Ia AJCC 8    09/29/2018 Surgery    Left lumpectomy: Grade 3 IDC, 1.7 cm, margins negative, lymphovascular invasion present, 0/2 lymph nodes negative, T1c N0 stage Ia    10/06/2018 Cancer Staging    Staging form: Breast, AJCC 8th Edition - Pathologic: Stage IA (pT1c, pN0(sn), cM0, G3, ER+, PR+, HER2-) - Signed by Nicholas Lose, MD on 10/06/2018    10/22/2018 Oncotype testing    Oncotype score 13: Distant recurrence at 9 years with hormone therapy alone 4%    11/12/2018 - 12/27/2018 Radiation Therapy    Adjuvant radiation therapy    12/27/2018 -  Anti-estrogen oral therapy    Antiestrogen therapy with letrozole 2.5 mg daily     CHIEF COMPLIANT: Follow-up to discuss adjuvant antiestrogen therapy plan  INTERVAL HISTORY: Ashley Clark is a 57 year old above-mentioned history of left breast cancer underwent lumpectomy followed by radiation.  She will finish radiation tomorrow.  She is here to discuss antiestrogen treatment plan which follows radiation.  She does have radiation dermatitis.  She does have fatigue as well.  She has been working throughout the radiation treatment.  She  does feel exhausted.  REVIEW OF SYSTEMS:   Constitutional: Denies fevers, chills or abnormal weight loss Eyes: Denies blurriness of vision Ears, nose, mouth, throat, and face: Denies mucositis or sore throat Respiratory: Denies cough, dyspnea or wheezes Cardiovascular: Denies palpitation, chest discomfort Gastrointestinal:  Denies nausea, heartburn or change in bowel habits Skin: Denies abnormal skin rashes Lymphatics: Denies new lymphadenopathy or easy bruising Neurological:Denies numbness, tingling or new weaknesses Behavioral/Psych: Mood is stable, no new changes  Extremities: No lower extremity edema  All other systems were reviewed with the patient and are negative.  I have reviewed the past medical history, past surgical history, social history and family history with the patient and they are unchanged from previous note.  ALLERGIES:  has No Known Allergies.  MEDICATIONS:  Current Outpatient Medications  Medication Sig Dispense Refill  . cetirizine (ZYRTEC) 10 MG chewable tablet Chew 10 mg by mouth daily.    Marland Kitchen ibuprofen (ADVIL,MOTRIN) 800 MG tablet Take 1 tablet (800 mg total) by mouth every 8 (eight) hours as needed. (Patient not taking: Reported on 11/04/2018) 30 tablet 0  . oxyCODONE (OXY IR/ROXICODONE) 5 MG immediate release tablet Take 1 tablet (5 mg total) by mouth every 6 (six) hours as needed for severe pain. (Patient not taking: Reported on 11/04/2018) 10 tablet 0   No current facility-administered medications for this visit.     PHYSICAL EXAMINATION: ECOG PERFORMANCE STATUS: 1 - Symptomatic but completely ambulatory  Vitals:  12/27/18 0954  BP: 128/78  Pulse: 78  Resp: 17  Temp: 98.1 F (36.7 C)  SpO2: 98%   Filed Weights   12/27/18 0954  Weight: 211 lb 3.2 oz (95.8 kg)    GENERAL:alert, no distress and comfortable SKIN: skin color, texture, turgor are normal, no rashes or significant lesions EYES: normal, Conjunctiva are pink and non-injected, sclera  clear OROPHARYNX:no exudate, no erythema and lips, buccal mucosa, and tongue normal  NECK: supple, thyroid normal size, non-tender, without nodularity LYMPH:  no palpable lymphadenopathy in the cervical, axillary or inguinal LUNGS: clear to auscultation and percussion with normal breathing effort HEART: regular rate & rhythm and no murmurs and no lower extremity edema ABDOMEN:abdomen soft, non-tender and normal bowel sounds MUSCULOSKELETAL:no cyanosis of digits and no clubbing  NEURO: alert & oriented x 3 with fluent speech, no focal motor/sensory deficits EXTREMITIES: No lower extremity edema   LABORATORY DATA:  I have reviewed the data as listed No flowsheet data found.  Lab Results  Component Value Date   WBC 5.0 09/27/2018   HGB 13.7 09/27/2018   HCT 43.6 09/27/2018   MCV 85.3 09/27/2018   PLT 268 09/27/2018   NEUTROABS 2.6 09/27/2018    ASSESSMENT & PLAN:  Malignant neoplasm of overlapping sites of left breast in female, estrogen receptor positive ([place]) 09/29/2018 left lumpectomy: Grade 3 IDC, 1.7 cm, margins negative, lymphovascular invasion present, 0/2 lymph nodes negative, T1c N0 stage Ia, ER 90%, PR 95%, HER-2 negative, Ki-67 20% Oncotype DX score 13: Risk of recurrence at 9 years: 4% Current treatment:  1. Adjuvant radiation therapy 11/12/2018-12/27/2018 2.  Adjuvant antiestrogen therapy: Letrozole 2.5 mg daily started 12/27/2018  Letrozole counseling:We discussed the risks and benefits of anti-estrogen therapy with aromatase inhibitors. These include but not limited to insomnia, hot flashes, mood changes, vaginal dryness, bone density loss, and weight gain. We strongly believe that the benefits far outweigh the risks. Patient understands these risks and consented to starting treatment. Planned treatment duration is 7 years.  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  Return to clinic in 3 months for survivorship care plan visit   No orders  of the defined types were placed in this encounter.  The patient has a good understanding of the overall plan. she agrees with it. she will call with any problems that may develop before the next visit here.   Harriette Ohara, MD 12/27/18

## 2018-12-27 NOTE — Assessment & Plan Note (Signed)
09/29/2018 left lumpectomy: Grade 3 IDC, 1.7 cm, margins negative, lymphovascular invasion present, 0/2 lymph nodes negative, T1c N0 stage Ia, ER 90%, PR 95%, HER-2 negative, Ki-67 20% Oncotype DX score 13: Risk of recurrence at 9 years: 4% Current treatment:  1. Adjuvant radiation therapy 11/12/2018-12/27/2018 2.  Adjuvant antiestrogen therapy: Letrozole 2.5 mg daily started 12/27/2018  Letrozole counseling:We discussed the risks and benefits of anti-estrogen therapy with aromatase inhibitors. These include but not limited to insomnia, hot flashes, mood changes, vaginal dryness, bone density loss, and weight gain. We strongly believe that the benefits far outweigh the risks. Patient understands these risks and consented to starting treatment. Planned treatment duration is 7 years.  Return to clinic in 3 months for survivorship care plan visit   

## 2018-12-28 ENCOUNTER — Ambulatory Visit: Payer: No Typology Code available for payment source

## 2018-12-28 ENCOUNTER — Encounter: Payer: Self-pay | Admitting: Radiation Oncology

## 2018-12-28 ENCOUNTER — Ambulatory Visit
Admission: RE | Admit: 2018-12-28 | Discharge: 2018-12-28 | Disposition: A | Discharge status: Home / Self Care | Payer: No Typology Code available for payment source | Source: Ambulatory Visit | Attending: Radiation Oncology | Admitting: Radiation Oncology

## 2018-12-28 DIAGNOSIS — Z510 Encounter for antineoplastic radiation therapy: Secondary | ICD-10-CM | POA: Diagnosis not present

## 2018-12-30 ENCOUNTER — Ambulatory Visit: Payer: No Typology Code available for payment source

## 2018-12-31 ENCOUNTER — Ambulatory Visit: Payer: No Typology Code available for payment source

## 2019-01-03 ENCOUNTER — Ambulatory Visit: Payer: No Typology Code available for payment source

## 2019-01-04 ENCOUNTER — Ambulatory Visit: Payer: No Typology Code available for payment source

## 2019-01-12 ENCOUNTER — Telehealth: Payer: Self-pay | Admitting: Adult Health

## 2019-01-12 NOTE — Telephone Encounter (Signed)
Patient called to reschedule  °

## 2019-01-13 NOTE — Progress Notes (Signed)
  Radiation Oncology         [phone] ________________________________  Name: Ashley Clark MRN: [id number]  Date: 12/28/2018  DOB: 1961-08-20  End of Treatment Note  Diagnosis:   58 y.o. female with Stage IA, pT1cN0M0, grade 3 ER/PR positive, invasive ductal carcinoma of the left breast    Indication for treatment:  Curative       Radiation treatment dates:   11/11/2018 - 12/28/2018  Site/dose:   The patient initially received a dose of 50.4 Gy in 28 fractions to the left breast using whole-breast tangent fields. This was delivered using a 3-D conformal technique. The patient then received a boost to the seroma. This delivered an additional 10 Gy in 5 fractions using a 6X, 10X photons with a Complex Isodose technique. The total dose was 60.4 Gy.  Narrative: The patient tolerated radiation treatment relatively well.   The patient had some expected skin irritation with erythema and hyperpigmentation with some dry desquamation as she progressed during treatment. Moist desquamation was not present at the end of treatment. She is using Neosporin with lidocaine and Radiaplex. She also noted increased fatigue.  Plan: The patient has completed radiation treatment. The patient will return to radiation oncology clinic for routine followup in one month. I advised the patient to call or return sooner if they have any questions or concerns related to their recovery or treatment. ________________________________  Jodelle Gross, MD, PhD  This document serves as a record of services personally performed by Kyung Rudd, MD. It was created on his behalf by Rae Lips, a trained medical scribe. The creation of this record is based on the scribe's personal observations and the provider's statements to them. This document has been checked and approved by the attending provider.

## 2019-02-07 ENCOUNTER — Other Ambulatory Visit: Payer: Self-pay

## 2019-02-07 ENCOUNTER — Encounter: Payer: Self-pay | Admitting: Radiation Oncology

## 2019-02-07 ENCOUNTER — Ambulatory Visit
Admission: RE | Admit: 2019-02-07 | Discharge: 2019-02-07 | Disposition: A | Discharge status: Home / Self Care | Payer: 59 | Source: Ambulatory Visit | Attending: Hematology and Oncology | Admitting: Hematology and Oncology

## 2019-02-07 VITALS — BP 122/78 | HR 87 | Temp 98.4°F | Resp 18 | Wt 213.4 lb

## 2019-02-07 DIAGNOSIS — C50812 Malignant neoplasm of overlapping sites of left female breast: Secondary | ICD-10-CM | POA: Diagnosis not present

## 2019-02-07 DIAGNOSIS — Z510 Encounter for antineoplastic radiation therapy: Secondary | ICD-10-CM | POA: Insufficient documentation

## 2019-02-07 DIAGNOSIS — Z79811 Long term (current) use of aromatase inhibitors: Secondary | ICD-10-CM | POA: Insufficient documentation

## 2019-02-07 DIAGNOSIS — Z170 Estrogen receptor positive status [ER+]: Secondary | ICD-10-CM | POA: Insufficient documentation

## 2019-02-07 NOTE — Progress Notes (Signed)
  Radiation Oncology         [phone] ________________________________  Name: Ashley Clark MRN: [id number]  Date of Service: 02/07/2019  DOB: 06-04-1961  Post Treatment Note  CC: Wells Guiles, MD  Diagnosis:   Stage IA, pT1cN0M0, grade 3 ER/PR positive, invasive ductal carcinoma of the left breast.  Interval Since Last Radiation:  6 weeks   11/11/2018 - 12/28/2018: The patient initially received a dose of 50.4 Gy in 28 fractions to the left breast using whole-breast tangent fields. This was delivered using a 3-D conformal technique. The patient then received a boost to the seroma. This delivered an additional 10 Gy in 5 fractions using a 6X, 10X photons with a Complex Isodose technique. The total dose was 60.4 Gy.  Narrative:  The patient returns today for routine follow-up. During treatment she did very well with radiotherapy and at the end of treatment had mild dry desquamation along the inframammary fold.                             On review of systems, the patient states she is doing well and denies any concerns with her skin at this time.   ALLERGIES:  has No Known Allergies.  Meds: Current Outpatient Medications  Medication Sig Dispense Refill  . calcium citrate-vitamin D (CITRACAL+D) 315-200 MG-UNIT tablet Take 1 tablet by mouth 2 (two) times daily.    . cetirizine (ZYRTEC) 10 MG chewable tablet Chew 10 mg by mouth daily.    Marland Kitchen ibuprofen (ADVIL,MOTRIN) 800 MG tablet Take 1 tablet (800 mg total) by mouth every 8 (eight) hours as needed. 30 tablet 0  . letrozole (FEMARA) 2.5 MG tablet Take 1 tablet (2.5 mg total) by mouth daily. 90 tablet 3   No current facility-administered medications for this encounter.     Physical Findings:  weight is 213 lb 6.4 oz (96.8 kg). Her oral temperature is 98.4 F (36.9 C). Her blood pressure is 122/78 and her pulse is 87. Her respiration is 18 and oxygen saturation is 98%.  Pain Assessment Pain Score: 0-No  pain/10 In general this is a well appearing African American female in no acute distress. She's alert and oriented x4 and appropriate throughout the examination. Cardiopulmonary assessment is negative for acute distress and she exhibits normal effort. The left breast was examined and reveals mild hyperpigmentation without desquamation.   Lab Findings: Lab Results  Component Value Date   WBC 5.0 09/27/2018   HGB 13.7 09/27/2018   HCT 43.6 09/27/2018   MCV 85.3 09/27/2018   PLT 268 09/27/2018     Radiographic Findings: No results found.  Impression/Plan: 1. Stage IA, pT1cN0M0, grade 3 ER/PR positive, invasive ductal carcinoma of the left breast. The patient has been doing well since completion of radiotherapy. We discussed that we would be happy to continue to follow her as needed, but she will also continue to follow up with Dr. Lindi Adie in medical oncology. She was counseled on skin care as well as measures to avoid sun exposure to this area.  2. Survivorship. We discussed the importance of survivorship evaluation and she is currently scheduled for this in the near future. She was also given the monthly calendar for access to resources offered within the cancer center.     Carola Rhine, PAC

## 2019-02-24 ENCOUNTER — Encounter: Payer: No Typology Code available for payment source | Admitting: Adult Health

## 2019-04-07 MED FILL — LETROZOLE 2.5 MG TABLET: 2.5 | 90 days supply | Qty: 90 | Fill #0

## 2019-05-10 ENCOUNTER — Telehealth: Payer: Self-pay | Admitting: Adult Health

## 2019-05-10 NOTE — Telephone Encounter (Signed)
Spoke with pt and she agrees to convert office visit into [employer]. Emailed step-by-step instructions how to download and access Webex mtg.

## 2019-05-16 ENCOUNTER — Encounter: Payer: Self-pay | Admitting: Adult Health

## 2019-05-16 ENCOUNTER — Inpatient Hospital Stay: Payer: 59 | Attending: Adult Health | Admitting: Adult Health

## 2019-05-16 DIAGNOSIS — E2839 Other primary ovarian failure: Secondary | ICD-10-CM

## 2019-05-16 DIAGNOSIS — Z923 Personal history of irradiation: Secondary | ICD-10-CM

## 2019-05-16 DIAGNOSIS — Z170 Estrogen receptor positive status [ER+]: Secondary | ICD-10-CM | POA: Diagnosis not present

## 2019-05-16 DIAGNOSIS — C50812 Malignant neoplasm of overlapping sites of left female breast: Secondary | ICD-10-CM | POA: Diagnosis not present

## 2019-05-16 DIAGNOSIS — Z79811 Long term (current) use of aromatase inhibitors: Secondary | ICD-10-CM | POA: Insufficient documentation

## 2019-05-16 DIAGNOSIS — Z87891 Personal history of nicotine dependence: Secondary | ICD-10-CM | POA: Insufficient documentation

## 2019-05-16 NOTE — Progress Notes (Signed)
SURVIVORSHIP VIRTUAL VISIT:  I connected with Ashley Clark on 05/16/19 at  2:00 PM EDT by [facility] and verified that I am speaking with the correct person using two identifiers.   I discussed the limitations, risks, security and privacy concerns of performing an evaluation and management service by video visit and the availability of in person appointments. I also discussed with the patient that there may be a patient responsible charge related to this service. The patient expressed understanding and agreed to proceed.   BRIEF ONCOLOGIC HISTORY:    Malignant neoplasm of overlapping sites of left breast in female, estrogen receptor positive ([place])   08/04/2018 Initial Diagnosis    Screening mammogram detected left breast mass at 9:30 position 1.2 x 1.2 x 1.1 cm.  Closer to the nipple at 9:30 position 0.8 cm lesion span 3.6 cm and a 1.5 cm apart.  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 position 2 cm from nipple, at 1 cm from nipple PASH, lymph node benign, ER 90%, PR 95%, Ki-67 20%, HER-2 negative by IHC 1+, T1CN0 stage Ia AJCC 8    09/29/2018 Surgery    Left lumpectomy: Grade 3 IDC, 1.7 cm, margins negative, lymphovascular invasion present, 0/2 lymph nodes negative, T1c N0 stage Ia    10/06/2018 Cancer Staging    Staging form: Breast, AJCC 8th Edition - Pathologic: Stage IA (pT1c, pN0(sn), cM0, G3, ER+, PR+, HER2-) - Signed by Nicholas Lose, MD on 10/06/2018    10/22/2018 Oncotype testing    Oncotype score 13: Distant recurrence at 9 years with hormone therapy alone 4%    11/12/2018 - 12/27/2018 Radiation Therapy    Adjuvant radiation therapy    12/27/2018 -  Anti-estrogen oral therapy    Antiestrogen therapy with letrozole 2.5 mg daily     INTERVAL HISTORY:  Ashley Clark to review her survivorship care plan detailing her treatment course for breast cancer, as well as monitoring long-term side effects of that  treatment, education regarding health maintenance, screening, and overall wellness and health promotion.     Overall, Ashley Clark reports feeling quite well.  She is taking Letrozole daily.  She says that she has been having hot flashes quite frequently throughout the day, and they do interfere with her sleep.  She is tolerating these as she believes that the anti estrogen therapy benefits outweigh her side effects.  She also has some joint pain worse in her hips and knees.  She notes she has gained some weight and think this may be contributing.  She notes that her husband says she has some mood changes.  She has some soreness at her breast near her lumpectomy site that has been present since radiation. She denies swelling in her left arm, or any swelling or pain in the left arm or axilla.  ROM normal in that arm. She denies any swelling, new lumps or bumps in the left breast/chest wall either.  REVIEW OF SYSTEMS:  Review of Systems  Constitutional: Negative for appetite change, chills, fatigue, fever and unexpected weight change.  HENT:   Negative for hearing loss, lump/mass, sore throat, tinnitus, trouble swallowing and voice change.   Eyes: Negative for eye problems and icterus.  Respiratory: Negative for chest tightness, cough, shortness of breath and wheezing.   Cardiovascular: Negative for chest pain, leg swelling and palpitations.  Gastrointestinal: Negative for abdominal distention, abdominal pain, constipation, diarrhea, nausea and vomiting.  Endocrine: Positive for hot flashes.  Musculoskeletal: Positive for arthralgias.  Skin: Negative for itching and rash.  Neurological: Negative for dizziness, extremity weakness, headaches and numbness.  Psychiatric/Behavioral: Negative for depression. The patient is not nervous/anxious.         ONCOLOGY TREATMENT TEAM:  1. Surgeon:  Dr. Brantley Stage at [facility] 2. Medical Oncologist: Dr. Lindi Adie  3. Radiation Oncologist: Dr. Lisbeth Renshaw     PAST MEDICAL/SURGICAL HISTORY:  Past Medical History:  Diagnosis Date  . Medical history non-contributory    Past Surgical History:  Procedure Laterality Date  . BREAST LUMPECTOMY WITH RADIOACTIVE SEED AND SENTINEL LYMPH NODE BIOPSY Left 09/29/2018   Procedure: LEFT BREAST LUMPECTOMY WITH RADIOACTIVE SEED AND SENTINEL LYMPH NODE BIOPSY;  Surgeon: Erroll Luna, MD;  Location: [place];  Service: General;  Laterality: Left;  . WISDOM TOOTH EXTRACTION       ALLERGIES:  No Known Allergies   CURRENT MEDICATIONS:  Outpatient Encounter Medications as of 05/16/2019  Medication Sig  . calcium citrate-vitamin D (CITRACAL+D) 315-200 MG-UNIT tablet Take 1 tablet by mouth 2 (two) times daily.  . cetirizine (ZYRTEC) 10 MG chewable tablet Chew 10 mg by mouth daily.  Marland Kitchen ibuprofen (ADVIL,MOTRIN) 800 MG tablet Take 1 tablet (800 mg total) by mouth every 8 (eight) hours as needed.  Marland Kitchen letrozole (FEMARA) 2.5 MG tablet Take 1 tablet (2.5 mg total) by mouth daily.   No facility-administered encounter medications on file as of 05/16/2019.      ONCOLOGIC FAMILY HISTORY:  Family History  Problem Relation Age of Onset  . Breast cancer Mother 63  . Colon cancer Neg Hx   . Rectal cancer Neg Hx   . Stomach cancer Neg Hx   . Esophageal cancer Neg Hx      GENETIC COUNSELING/TESTING: Not at this time  SOCIAL HISTORY:  Social History   Socioeconomic History  . Marital status: Married    Spouse name: Not on file  . Number of children: Not on file  . Years of education: Not on file  . Highest education level: Not on file  Occupational History  . Not on file  Social Needs  . Financial resource strain: Not on file  . Food insecurity:    Worry: Not on file    Inability: Not on file  . Transportation needs:    Medical: No    Non-medical: No  Tobacco Use  . Smoking status: Former Smoker    Packs/day: 0.25    Last attempt to quit: 01/2015    Years since quitting: 4.2  .  Smokeless tobacco: Never Used  . Tobacco comment: 3-4 cigs a day   Substance and Sexual Activity  . Alcohol use: Yes    Alcohol/week: 0.0 standard drinks    Comment: 1 glass of wine every few days   . Drug use: No  . Sexual activity: Not on file  Lifestyle  . Physical activity:    Days per week: Not on file    Minutes per session: Not on file  . Stress: Not on file  Relationships  . Social connections:    Talks on phone: Not on file    Gets together: Not on file    Attends religious service: Not on file    Active member of club or organization: Not on file    Attends meetings of clubs or organizations: Not on file    Relationship status: Not on file  . Intimate partner violence:  Fear of current or ex partner: No    Emotionally abused: No    Physically abused: No    Forced sexual activity: Not on file  Other Topics Concern  . Not on file  Social History Narrative  . Not on file     OBSERVATIONS/OBJECTIVE:  Patient is well appearing in no acute distress.  Appears stated age.  Skin is without rash or lesion.  Breathing is non labored.  Mood and behavior is normal.  Neurologically she is focally intact.    LABORATORY DATA:  None for this visit.  DIAGNOSTIC IMAGING:  None for this visit.      ASSESSMENT AND PLAN:  Ms.. Clark is a pleasant 58 y.o. female with Stage IA left breast invasive ductal carcinoma, ER+/PR+/HER2-, diagnosed in 07/2018, treated with lumpectomy, adjuvant radiation therapy, and anti-estrogen therapy with Letrozole beginning in 11/2018.  She presents to the Survivorship Clinic for our initial meeting and routine follow-up post-completion of treatment for breast cancer.    1. Stage IA left breast cancer:  Ashley Clark is continuing to recover from definitive treatment for breast cancer. She will follow-up with her medical oncologist, Dr. Lindi Adie in 08/2019 with history and physical exam per surveillance protocol.  She will continue her anti-estrogen  therapy with Letrozole. Thus far, she is tolerating the Letrozole well, with minimal side effects. She was instructed to make Dr. Lindi Adie or myself aware if she begins to experience any worsening side effects of the medication and I could see her back in clinic to help manage those side effects, as needed. Her mammogram is due 06/2019; orders placed today.  Her breast density is category C. Today, a comprehensive survivorship care plan and treatment summary was reviewed with the patient today detailing her breast cancer diagnosis, treatment course, potential late/long-term effects of treatment, appropriate follow-up care with recommendations for the future, and patient education resources.  A copy of this summary, along with a letter will be sent to the patient's primary care provider via mail/fax/In Basket message after today's visit.    2. Hot flashes: Reviewed non pharmacologic interventions such as acupuncture, avoiding hot/spicy foods, ETOH, caffeine, hot beverages.  Reviewed possibility of Gabapentin or Effexor in future if needed as patient determines.  3.  Joint aches/breast pain: Recommended exercise/yoga.  I also recommended she massage her breast with vitamin e oil, and let us know if she notes any new breast changes, breast swelling, or continued pain.  4. Bone health:  Given Ashley Clark age/history of breast cancer and her current treatment regimen including anti-estrogen therapy with Letrozole, she is at risk for bone demineralization.  She has not yet undergone bone density testing and I ordered this to be done in 06/2019 when she undergoes her mammogram.  In the meantime, she was encouraged to increase her consumption of foods rich in calcium, as well as increase her weight-bearing activities.  She was given education on specific activities to promote bone health.  5. Cancer screening:  Due to Ashley Clark's history and her age, she should receive screening for skin cancers, colon cancer, and  gynecologic cancers.  The information and recommendations are listed on the patient's comprehensive care plan/treatment summary and were reviewed in detail with the patient.    6. Health maintenance and wellness promotion: Ashley Clark was encouraged to consume 5-7 servings of fruits and vegetables per day. We reviewed the "Nutrition Rainbow" handout.  She was also encouraged to engage in moderate to vigorous exercise for 30 minutes per  day most days of the week. We discussed the [employer] fitness program, which is designed for cancer survivors to help them become more physically fit after cancer treatments.  She was instructed to limit her alcohol consumption and continue to abstain from tobacco use.     7. Support services/counseling: It is not uncommon for this period of the patient's cancer care trajectory to be one of many emotions and stressors.  We discussed how this can be increasingly difficult during the times of quarantine and social distancing due to the COVID-19 pandemic.   She was given information regarding our available services and encouraged to contact me with any questions or for help enrolling in any of our support group/programs.    Follow up instructions:    -Return to cancer center in 08/2019 for f/u with Dr. Lindi Adie  -Mammogram due in 06/2019 -Follow up with Dr. Brantley Stage 02/2020 -She is welcome to return back to the Survivorship Clinic at any time; no additional follow-up needed at this time.  -Consider referral back to survivorship as a long-term survivor for continued surveillance  The patient was provided an opportunity to ask questions and all were answered. The patient agreed with the plan and demonstrated an understanding of the instructions.   The patient was advised to call back or seek an in-person evaluation if the symptoms worsen or if the condition fails to improve as anticipated.   I provided 30 minutes of face-to-face video visit time during this encounter,  and > 50% was spent counseling as documented under my assessment & plan.  Scot Dock, NP

## 2019-05-18 ENCOUNTER — Telehealth: Payer: Self-pay | Admitting: Hematology and Oncology

## 2019-05-18 NOTE — Telephone Encounter (Signed)
Called regarding schedule °

## 2019-07-26 MED FILL — LETROZOLE 2.5 MG TABS: 2.5 | 90 days supply | Qty: 90 | Fill #0

## 2019-09-04 NOTE — Progress Notes (Signed)
Patient Care Team: Lennie Odor, PA-C as PCP - General (Nurse Practitioner) Nicholas Lose, MD as Consulting Physician (Hematology and Oncology) Kyung Rudd, MD as Consulting Physician (Radiation Oncology) Erroll Luna, MD as Consulting Physician (General Surgery)  DIAGNOSIS:    ICD-10-CM   1. Malignant neoplasm of overlapping sites of left breast in female, estrogen receptor positive ([place])  C50.812    Z17.0     SUMMARY OF ONCOLOGIC HISTORY: Oncology History  Malignant neoplasm of overlapping sites of left breast in female, estrogen receptor positive ([place])  08/04/2018 Initial Diagnosis   Screening mammogram detected left breast mass at 9:30 position 1.2 x 1.2 x 1.1 cm.  Closer to the nipple at 9:30 position 0.8 cm lesion span 3.6 cm and a 1.5 cm apart.  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 position 2 cm from nipple, at 1 cm from nipple PASH, lymph node benign, ER 90%, PR 95%, Ki-67 20%, HER-2 negative by IHC 1+, T1CN0 stage Ia AJCC 8   09/29/2018 Surgery   Left lumpectomy: Grade 3 IDC, 1.7 cm, margins negative, lymphovascular invasion present, 0/2 lymph nodes negative, T1c N0 stage Ia   10/06/2018 Cancer Staging   Staging form: Breast, AJCC 8th Edition - Pathologic: Stage IA (pT1c, pN0(sn), cM0, G3, ER+, PR+, HER2-) - Signed by Nicholas Lose, MD on 10/06/2018   10/22/2018 Oncotype testing   Oncotype score 13: Distant recurrence at 9 years with hormone therapy alone 4%   11/12/2018 - 12/27/2018 Radiation Therapy   Adjuvant radiation therapy   12/27/2018 -  Anti-estrogen oral therapy   Antiestrogen therapy with letrozole 2.5 mg daily     CHIEF COMPLIANT: Follow-up of left breast cancer on letrozole  INTERVAL HISTORY: Ashley Clark is a 58 y.o. with above-mentioned history of left breast cancer who underwent lumpectomy, radiation, and is currently on anti-estrogen therapy with letrozole. I last  saw her 9 months ago and in the interim she was seen by Wilber Bihari, NP for survivorship clinic. She presents to the clinic today for follow-up.   REVIEW OF SYSTEMS:   Constitutional: Denies fevers, chills or abnormal weight loss Eyes: Denies blurriness of vision Ears, nose, mouth, throat, and face: Denies mucositis or sore throat Respiratory: Denies cough, dyspnea or wheezes Cardiovascular: Denies palpitation, chest discomfort Gastrointestinal: Denies nausea, heartburn or change in bowel habits Skin: Denies abnormal skin rashes Lymphatics: Denies new lymphadenopathy or easy bruising Neurological: Denies numbness, tingling or new weaknesses Behavioral/Psych: Mood is stable, no new changes  Extremities: No lower extremity edema Breast: denies any pain or lumps or nodules in either breasts All other systems were reviewed with the patient and are negative.  I have reviewed the past medical history, past surgical history, social history and family history with the patient and they are unchanged from previous note.  ALLERGIES:  has No Known Allergies.  MEDICATIONS:  Current Outpatient Medications  Medication Sig Dispense Refill  . calcium citrate-vitamin D (CITRACAL+D) 315-200 MG-UNIT tablet Take 1 tablet by mouth 2 (two) times daily.    . cetirizine (ZYRTEC) 10 MG chewable tablet Chew 10 mg by mouth daily.    Marland Kitchen ibuprofen (ADVIL,MOTRIN) 800 MG tablet Take 1 tablet (800 mg total) by mouth every 8 (eight) hours as needed. 30 tablet 0  . letrozole (FEMARA) 2.5 MG tablet Take 1 tablet (2.5 mg total) by mouth daily. 90 tablet 3   No current facility-administered medications for  this visit.     PHYSICAL EXAMINATION: ECOG PERFORMANCE STATUS: 1 - Symptomatic but completely ambulatory  Vitals:   09/06/19 1435  BP: 113/74  Pulse: 84  Resp: 18  Temp: 98.2 F (36.8 C)  SpO2: 97%   Filed Weights   09/06/19 1435  Weight: 211 lb 3.2 oz (95.8 kg)    GENERAL: alert, no distress and  comfortable SKIN: skin color, texture, turgor are normal, no rashes or significant lesions EYES: normal, Conjunctiva are pink and non-injected, sclera clear OROPHARYNX: no exudate, no erythema and lips, buccal mucosa, and tongue normal  NECK: supple, thyroid normal size, non-tender, without nodularity LYMPH: no palpable lymphadenopathy in the cervical, axillary or inguinal LUNGS: clear to auscultation and percussion with normal breathing effort HEART: regular rate & rhythm and no murmurs and no lower extremity edema ABDOMEN: abdomen soft, non-tender and normal bowel sounds MUSCULOSKELETAL: no cyanosis of digits and no clubbing  NEURO: alert & oriented x 3 with fluent speech, no focal motor/sensory deficits EXTREMITIES: No lower extremity edema BREAST: No palpable masses or nodules in either right or left breasts. No palpable axillary supraclavicular or infraclavicular adenopathy no breast tenderness or nipple discharge. (exam performed in the presence of a chaperone)  LABORATORY DATA:  I have reviewed the data as listed No flowsheet data found.  Lab Results  Component Value Date   WBC 5.0 09/27/2018   HGB 13.7 09/27/2018   HCT 43.6 09/27/2018   MCV 85.3 09/27/2018   PLT 268 09/27/2018   NEUTROABS 2.6 09/27/2018    ASSESSMENT & PLAN:  Malignant neoplasm of overlapping sites of left breast in female, estrogen receptor positive ([place]) 09/29/2018 left lumpectomy: Grade 3 IDC, 1.7 cm, margins negative, lymphovascular invasion present, 0/2 lymph nodes negative, T1c N0 stage Ia, ER 90%, PR 95%, HER-2 negative, Ki-67 20% Oncotype DX score 13: Risk of recurrence at 9 years: 4% Current treatment:  1. Adjuvant radiation therapy 11/12/2018-12/27/2018 2.  Adjuvant antiestrogen therapy: Letrozole 2.5 mg daily started 12/27/2018  Letrozole toxicities:  Moderate to severe hot flashes: Patient is managing it.  Breast cancer surveillance: Mammograms have been ordered. Breast exam 09/06/2019: Left  breast has post radiation scar tissue and is moderately tender.  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  Return to clinic in 1 year for follow-up    No orders of the defined types were placed in this encounter.  The patient has a good understanding of the overall plan. she agrees with it. she will call with any problems that may develop before the next visit here.  Nicholas Lose, MD 09/06/2019  Julious Oka Dorshimer am acting as scribe for Dr. Nicholas Lose.  I have reviewed the above documentation for accuracy and completeness, and I agree with the above.

## 2019-09-06 ENCOUNTER — Other Ambulatory Visit: Payer: Self-pay

## 2019-09-06 ENCOUNTER — Inpatient Hospital Stay: Payer: 59 | Attending: Hematology and Oncology | Admitting: Hematology and Oncology

## 2019-09-06 DIAGNOSIS — C50812 Malignant neoplasm of overlapping sites of left female breast: Secondary | ICD-10-CM | POA: Diagnosis not present

## 2019-09-06 DIAGNOSIS — Z170 Estrogen receptor positive status [ER+]: Secondary | ICD-10-CM

## 2019-09-06 DIAGNOSIS — Z79811 Long term (current) use of aromatase inhibitors: Secondary | ICD-10-CM | POA: Diagnosis not present

## 2019-09-06 MED ORDER — LETROZOLE 2.5 MG PO TABS: 2.5000 mg | ORAL_TABLET | Freq: Every day | ORAL | 3 refills | Status: DC

## 2019-09-06 NOTE — Assessment & Plan Note (Signed)
09/29/2018 left lumpectomy: Grade 3 IDC, 1.7 cm, margins negative, lymphovascular invasion present, 0/2 lymph nodes negative, T1c N0 stage Ia, ER 90%, PR 95%, HER-2 negative, Ki-67 20% Oncotype DX score 13: Risk of recurrence at 9 years: 4% Current treatment:  1. Adjuvant radiation therapy 11/12/2018-12/27/2018 2.  Adjuvant antiestrogen therapy: Letrozole 2.5 mg daily started 12/27/2018  Letrozole toxicities:  Breast cancer surveillance: Mammograms have been ordered. 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  Return to clinic in 1 year for follow-up

## 2019-09-07 ENCOUNTER — Telehealth: Payer: Self-pay | Admitting: Hematology and Oncology

## 2019-09-07 NOTE — Telephone Encounter (Signed)
I talk with patient regarding schedule  

## 2019-09-09 ENCOUNTER — Encounter: Payer: Self-pay | Admitting: Gastroenterology

## 2019-09-12 ENCOUNTER — Other Ambulatory Visit: Payer: Self-pay

## 2019-09-12 ENCOUNTER — Ambulatory Visit
Admission: RE | Admit: 2019-09-12 | Discharge: 2019-09-12 | Disposition: A | Discharge status: Home / Self Care | Payer: 59 | Source: Ambulatory Visit | Attending: Adult Health | Admitting: Adult Health

## 2019-09-12 ENCOUNTER — Telehealth: Payer: Self-pay

## 2019-09-12 DIAGNOSIS — Z13820 Encounter for screening for osteoporosis: Secondary | ICD-10-CM | POA: Diagnosis not present

## 2019-09-12 DIAGNOSIS — Z780 Asymptomatic menopausal state: Secondary | ICD-10-CM | POA: Diagnosis not present

## 2019-09-12 DIAGNOSIS — E2839 Other primary ovarian failure: Secondary | ICD-10-CM

## 2019-09-12 NOTE — Telephone Encounter (Signed)
Spoke with patient to inform that BD results are normal.  Patient voiced understanding and thanks for call.

## 2019-09-12 NOTE — Telephone Encounter (Signed)
-----   Message from Gardenia Phlegm, NP sent at 09/12/2019 11:59 AM EDT ----- Please call patient and let her know that her bone density is normal.   ----- Message ----- From: Interface, Rad Results In Sent: 09/12/2019  11:00 AM EDT To: Gardenia Phlegm, NP

## 2019-09-16 ENCOUNTER — Ambulatory Visit
Admission: RE | Admit: 2019-09-16 | Discharge: 2019-09-16 | Disposition: A | Discharge status: Home / Self Care | Payer: 59 | Source: Ambulatory Visit | Attending: Hematology and Oncology | Admitting: Hematology and Oncology

## 2019-09-16 ENCOUNTER — Other Ambulatory Visit: Payer: Self-pay

## 2019-09-16 DIAGNOSIS — R922 Inconclusive mammogram: Secondary | ICD-10-CM | POA: Diagnosis not present

## 2019-09-16 DIAGNOSIS — C50812 Malignant neoplasm of overlapping sites of left female breast: Secondary | ICD-10-CM

## 2019-09-16 DIAGNOSIS — Z170 Estrogen receptor positive status [ER+]: Secondary | ICD-10-CM

## 2019-09-29 ENCOUNTER — Other Ambulatory Visit: Payer: Self-pay

## 2019-09-29 DIAGNOSIS — Z20822 Contact with and (suspected) exposure to covid-19: Secondary | ICD-10-CM

## 2019-09-30 LAB — NOVEL CORONAVIRUS, NAA: SARS-CoV-2, NAA: NOT DETECTED

## 2019-10-06 MED FILL — SHINGRIX 50 MCG SUS: 50 | 1 days supply | Qty: 1 | Fill #0

## 2019-10-10 IMAGING — MG NEEDLE LOCALIZATION OF THE LEFT BREAST WITH MAMMO GUIDANCE
5 series · 5 of 5 positions shown · non-contrast
Comparison: Previous exam(s).

CLINICAL DATA: The patient presents for seed localization prior to
lumpectomy for invasive ductal carcinoma and ductal carcinoma in
situ in the 9:30 o'clock location of the LEFT breast (heart shaped
clip). Additionally, localization will be performed of a second area
in the 9:30 o'clock location, 1 centimeter from the nipple for which
biopsy showed pseudoangiomatous stromal hyperplasia and was felt to
be discordant (coil shaped clip). A ribbon shaped clip is also in
close proximity to the other clips will not be localized. The
patient is also scheduled for a sentinel lymph node biopsy at the
time of surgery. Previous ultrasound-guided core biopsy of LEFT
axillary lymph node was benign and concordant.

EXAM:
MAMMOGRAPHIC GUIDED RADIOACTIVE SEED LOCALIZATION OF THE LEFT BREAST
x2

[L ML (1 of 4)]
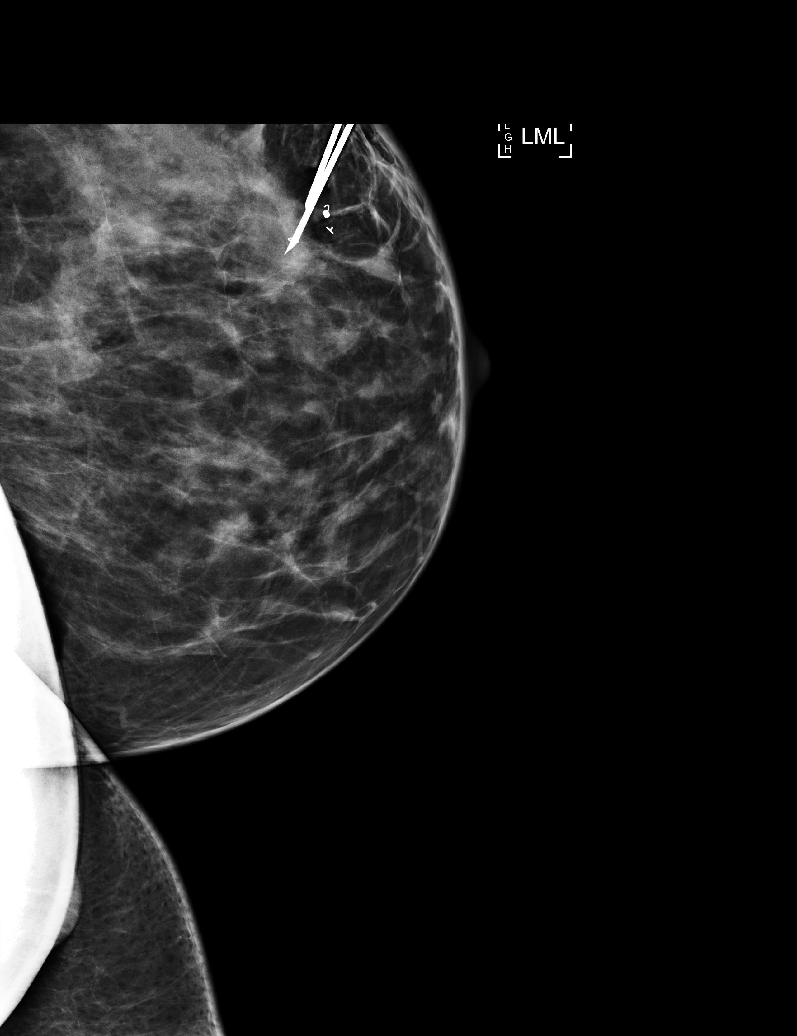

[L ML (2 of 4)]
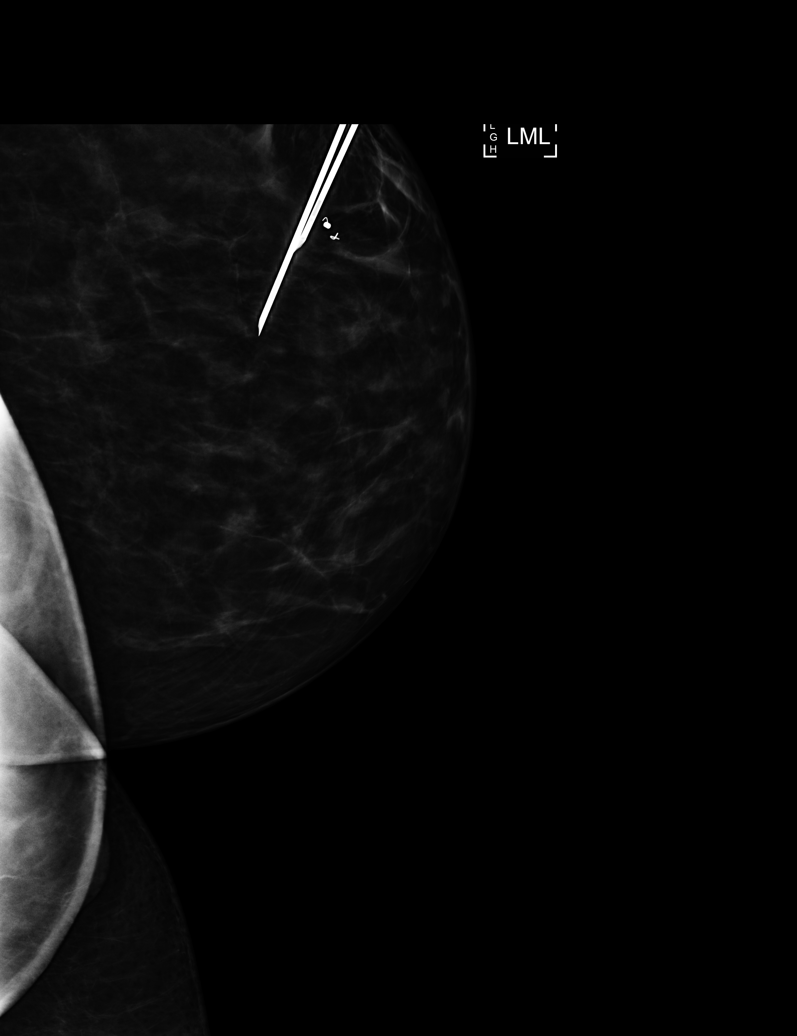

[L ML (3 of 4)]
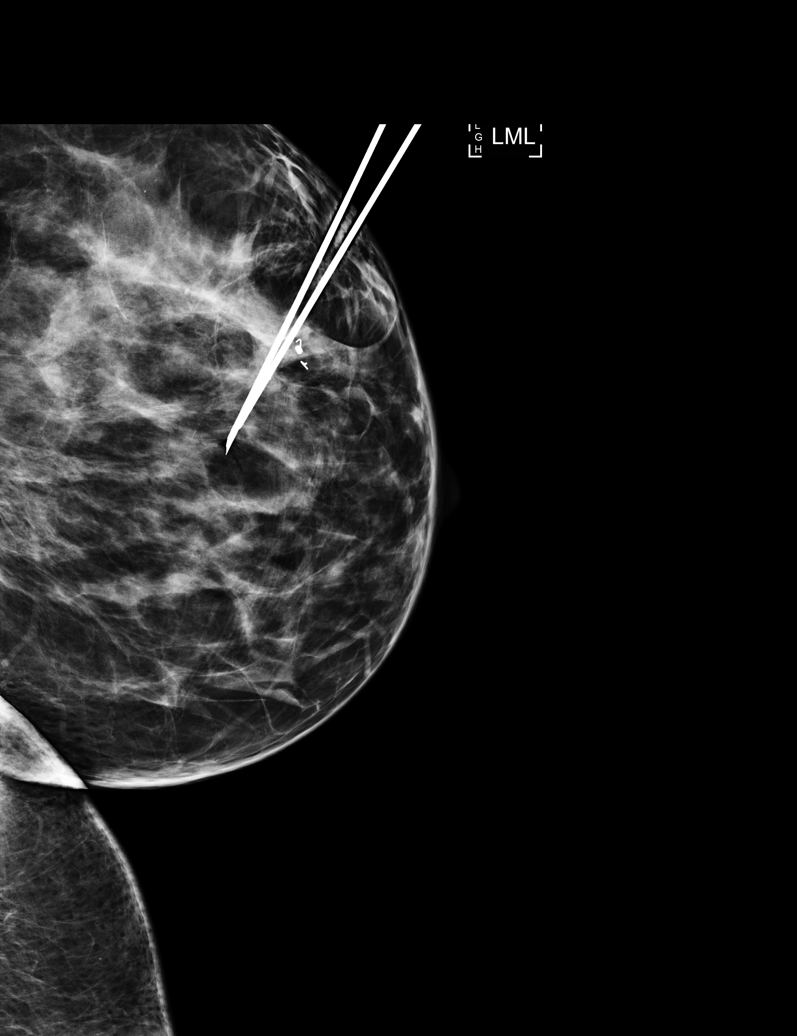

[L ML (4 of 4)]
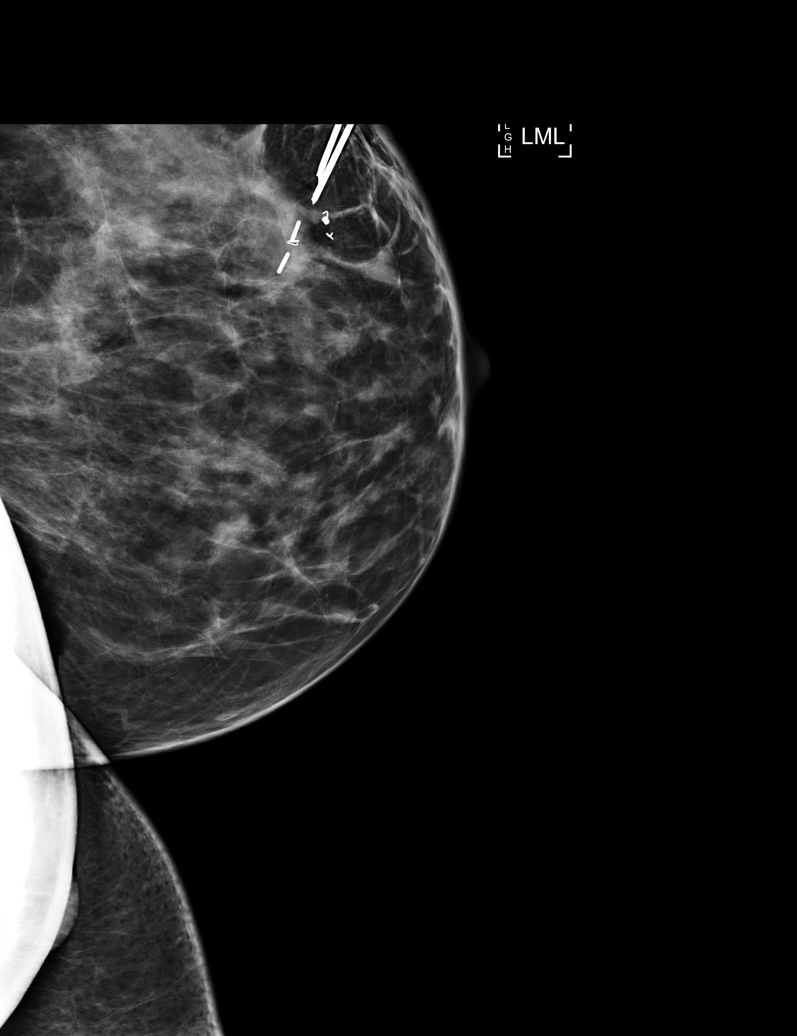

[L CC]
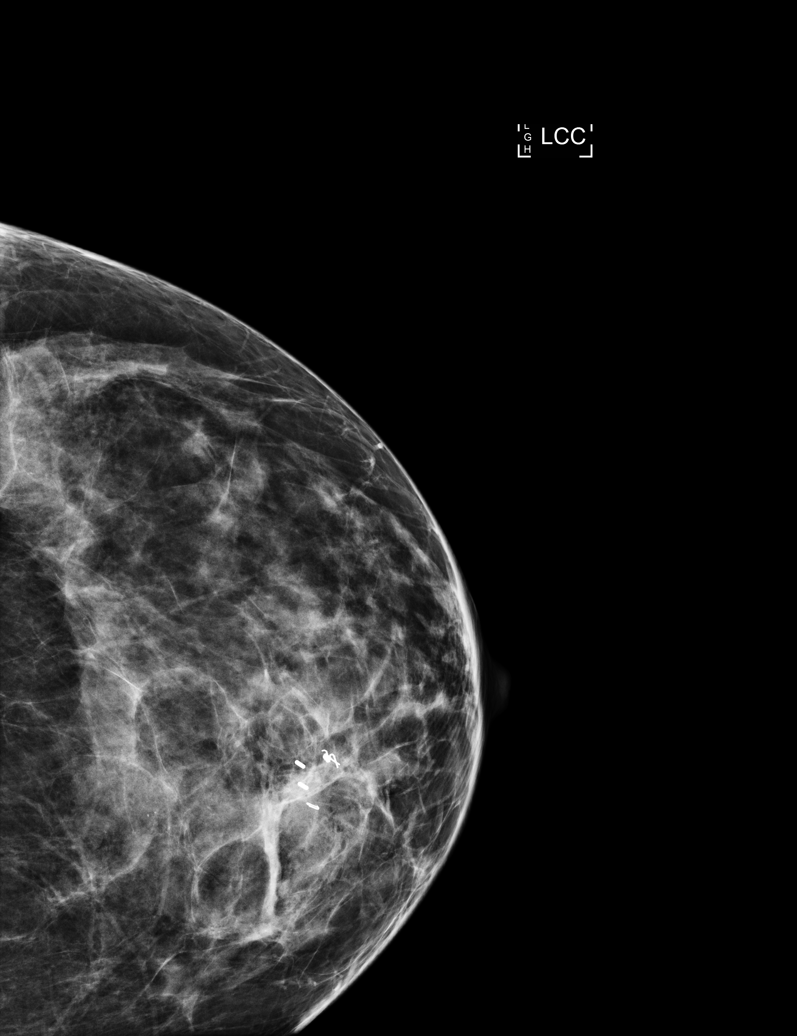

[5 of 5 positions shown; findings below may reference images not displayed]

FINDINGS: Patient presents for radioactive seed localization prior to
lumpectomy of the LEFT breast. I met with the patient and we
discussed the procedure of seed localization including benefits and
alternatives. We discussed the high likelihood of a successful
procedure. We discussed the risks of the procedure including
infection, bleeding, tissue injury and further surgery. We discussed
the low dose of radioactivity involved in the procedure. Informed,
written consent was given.

The usual time-out protocol was performed immediately prior to the
procedure.

Using mammographic guidance, sterile technique, 1% lidocaine and an
Q-MPA radioactive seed, the heart shaped clip in the UPPER INNER
QUADRANT of the LEFT breast was localized using a superior to
inferior approach. The follow-up mammogram images confirm the seed
in the expected location and were marked for Dr. Mans.

Follow-up survey of the patient confirms presence of the radioactive
seed.

Order number of Q-MPA seed:  [id number].

Total activity:  0.252 millicuries reference Date: 09/21/2018

Using mammographic guidance, sterile technique, 1% lidocaine and an
Q-MPA radioactive seed, the coil shaped clip in the UPPER INNER
QUADRANT of the LEFT breast was localized using a is superior to
inferior approach. The follow-up mammogram images confirm the seed
in the expected location and were marked for Dr. Mans.

Follow-up survey of the patient confirms presence of the radioactive
seed.

Order number of Q-MPA seed:  [id number].

Total activity:  0.252 millicuries reference Date: 09/21/2018

The patient tolerated the procedure well and was released from the
[REDACTED]. She was given instructions regarding seed removal.
IMPRESSION: Radioactive seed localization of 2 sites in the LEFT breast. No
apparent complications.

## 2019-10-14 ENCOUNTER — Ambulatory Visit (AMBULATORY_SURGERY_CENTER): Payer: 59 | Admitting: *Deleted

## 2019-10-14 ENCOUNTER — Other Ambulatory Visit: Payer: Self-pay

## 2019-10-14 VITALS — Temp 96.8°F | Ht 61.5 in | Wt 212.0 lb

## 2019-10-14 DIAGNOSIS — Z1159 Encounter for screening for other viral diseases: Secondary | ICD-10-CM

## 2019-10-14 DIAGNOSIS — Z1211 Encounter for screening for malignant neoplasm of colon: Secondary | ICD-10-CM

## 2019-10-14 MED ORDER — NA SULFATE-K SULFATE-MG SULF 17.5-3.13-1.6 GM/177ML PO SOLN: ORAL | 0 refills | Status: DC

## 2019-10-14 MED FILL — SUPREP BOWEL PREP KIT: 17.5-3.13-1 | 1 days supply | Qty: 354 | Fill #0

## 2019-10-14 NOTE — Progress Notes (Signed)
Patient is here in-person for PV. Patient denies any allergies to eggs or soy. Patient denies any problems with anesthesia/sedation. Patient denies any oxygen use at home. Patient denies taking any diet/weight loss medications or blood thinners. Patient is not being treated for MRSA or C-diff. EMMI education assisgned to patient on colonoscopy, this was explained and instructions given to patient.   Due to the COVID-19 pandemic we are asking patients to follow these guidelines. Please only bring one care partner. Please be aware that your care partner may wait in the car in the parking lot or if they feel like they it will be too hot or cold to wait in the car, they may wait in the lobby on the 4th floor. All care partners are required to wear a mask the entire time (we do not have any that we can provide them), they need to practice social distancing, and we will do a Covid check for all patient's and care partners when you arrive. Also we will check their temperature and your temperature. If the care partner waits in their car they need to stay in the parking lot the entire time and we will call them on their cell phone when the patient is ready for discharge so they can bring the car to the front of the building. Also all patient's and visitors will need to wear a mask into building.   Covid test on 10/27/2019 at 11 am-pt is aware.

## 2019-10-18 DIAGNOSIS — H524 Presbyopia: Secondary | ICD-10-CM | POA: Diagnosis not present

## 2019-10-25 ENCOUNTER — Other Ambulatory Visit (HOSPITAL_COMMUNITY)
Admission: RE | Admit: 2019-10-25 | Discharge: 2019-10-25 | Disposition: A | Discharge status: Home / Self Care | Payer: 59 | Source: Ambulatory Visit | Attending: Physician Assistant | Admitting: Physician Assistant

## 2019-10-25 ENCOUNTER — Other Ambulatory Visit: Payer: Self-pay | Admitting: Physician Assistant

## 2019-10-25 DIAGNOSIS — R7303 Prediabetes: Secondary | ICD-10-CM | POA: Diagnosis not present

## 2019-10-25 DIAGNOSIS — Z01419 Encounter for gynecological examination (general) (routine) without abnormal findings: Secondary | ICD-10-CM | POA: Diagnosis not present

## 2019-10-25 DIAGNOSIS — Z13220 Encounter for screening for lipoid disorders: Secondary | ICD-10-CM | POA: Diagnosis not present

## 2019-10-25 DIAGNOSIS — Z853 Personal history of malignant neoplasm of breast: Secondary | ICD-10-CM | POA: Diagnosis not present

## 2019-10-25 DIAGNOSIS — Z124 Encounter for screening for malignant neoplasm of cervix: Secondary | ICD-10-CM | POA: Diagnosis not present

## 2019-10-25 DIAGNOSIS — Z0000 Encounter for general adult medical examination without abnormal findings: Secondary | ICD-10-CM | POA: Diagnosis not present

## 2019-10-25 DIAGNOSIS — Z131 Encounter for screening for diabetes mellitus: Secondary | ICD-10-CM | POA: Diagnosis not present

## 2019-10-27 ENCOUNTER — Other Ambulatory Visit: Payer: Self-pay | Admitting: Gastroenterology

## 2019-10-27 DIAGNOSIS — Z1159 Encounter for screening for other viral diseases: Secondary | ICD-10-CM | POA: Diagnosis not present

## 2019-10-27 LAB — CYTOLOGY - PAP: Diagnosis: NEGATIVE

## 2019-10-27 LAB — SARS CORONAVIRUS 2 (TAT 6-24 HRS): SARS Coronavirus 2: NEGATIVE

## 2019-10-28 ENCOUNTER — Encounter: Payer: 59 | Admitting: Gastroenterology

## 2019-11-01 ENCOUNTER — Other Ambulatory Visit: Payer: Self-pay

## 2019-11-01 ENCOUNTER — Encounter: Payer: Self-pay | Admitting: Gastroenterology

## 2019-11-01 ENCOUNTER — Ambulatory Visit (AMBULATORY_SURGERY_CENTER): Payer: 59 | Admitting: Gastroenterology

## 2019-11-01 VITALS — BP 139/80 | HR 67 | Temp 98.0°F | Resp 17 | Ht 61.0 in | Wt 212.0 lb

## 2019-11-01 DIAGNOSIS — Z1211 Encounter for screening for malignant neoplasm of colon: Secondary | ICD-10-CM | POA: Diagnosis not present

## 2019-11-01 DIAGNOSIS — Z86010 Personal history of colonic polyps: Secondary | ICD-10-CM

## 2019-11-01 DIAGNOSIS — D125 Benign neoplasm of sigmoid colon: Secondary | ICD-10-CM | POA: Diagnosis not present

## 2019-11-01 MED ORDER — SODIUM CHLORIDE 0.9 % IV SOLN: 500.0000 mL | Freq: Once | INTRAVENOUS | Status: DC

## 2019-11-01 NOTE — Progress Notes (Signed)
To PACU, VSS. Report to [email or web address] 

## 2019-11-01 NOTE — Progress Notes (Signed)
Called to room to assist during endoscopic procedure.  Patient ID and intended procedure confirmed with present staff. Received instructions for my participation in the procedure from the performing physician.  

## 2019-11-01 NOTE — Patient Instructions (Signed)
Read all of the handouts given to you by your recovery room nurse. Your biopsies will tell us when you need to come back.  You will receive a letter in about two weeks.  YOU HAD AN ENDOSCOPIC PROCEDURE TODAY AT [facility]:   Refer to the procedure report that was given to you for any specific questions about what was found during the examination.  If the procedure report does not answer your questions, please call your gastroenterologist to clarify.  If you requested that your care partner not be given the details of your procedure findings, then the procedure report has been included in a sealed envelope for you to review at your convenience later.  YOU SHOULD EXPECT: Some feelings of bloating in the abdomen. Passage of more gas than usual.  Walking can help get rid of the air that was put into your GI tract during the procedure and reduce the bloating. If you had a lower endoscopy (such as a colonoscopy or flexible sigmoidoscopy) you may notice spotting of blood in your stool or on the toilet paper. If you underwent a bowel prep for your procedure, you may not have a normal bowel movement for a few days.  Please Note:  You might notice some irritation and congestion in your nose or some drainage.  This is from the oxygen used during your procedure.  There is no need for concern and it should clear up in a day or so.  SYMPTOMS TO REPORT IMMEDIATELY:   Following lower endoscopy (colonoscopy or flexible sigmoidoscopy):  Excessive amounts of blood in the stool  Significant tenderness or worsening of abdominal pains  Swelling of the abdomen that is new, acute  Fever of 100F or higher   For urgent or emergent issues, a gastroenterologist can be reached at any hour by calling [phone].   DIET:  We do recommend a small meal at first, but then you may proceed to your regular diet.  Drink plenty of fluids but you should avoid alcoholic beverages for 24 hours.  ACTIVITY:  You  should plan to take it easy for the rest of today and you should NOT DRIVE or use heavy machinery until tomorrow (because of the sedation medicines used during the test).    FOLLOW UP: Our staff will call the number listed on your records 48-72 hours following your procedure to check on you and address any questions or concerns that you may have regarding the information given to you following your procedure. If we do not reach you, we will leave a message.  We will attempt to reach you two times.  During this call, we will ask if you have developed any symptoms of COVID 19. If you develop any symptoms (ie: fever, flu-like symptoms, shortness of breath, cough etc.) before then, please call [phone].  If you test positive for Covid 19 in the 2 weeks post procedure, please call and report this information to [place].    If any biopsies were taken you will be contacted by phone or by letter within the next 1-3 weeks.  Please call us at [phone] if you have not heard about the biopsies in 3 weeks.    SIGNATURES/CONFIDENTIALITY: You and/or your care partner have signed paperwork which will be entered into your electronic medical record.  These signatures attest to the fact that that the information above on your After Visit Summary has been reviewed and is understood.  Full responsibility of the confidentiality of this  discharge information lies with you and/or your care-partner. 

## 2019-11-01 NOTE — Progress Notes (Signed)
Pt's states no medical or surgical changes since previsit or office visit. 

## 2019-11-01 NOTE — Op Note (Signed)
[facility] [place] Patient Name: Senaida Klingler Procedure Date: 11/01/2019 8:36 AM MRN: [id number] Endoscopist: Milus Banister , MD Age: 58 Referring MD:  Date of Birth: [birth year] Gender: Female Account #: [id number] Procedure:                Colonoscopy Indications:              High risk colon cancer surveillance: Personal                            history of colonic polyps; colonoscopy 2016 24mm                            traditional serrated adenoma removed Medicines:                Monitored Anesthesia Care Procedure:                Pre-Anesthesia Assessment:                           - Prior to the procedure, a History and Physical                            was performed, and patient medications and                            allergies were reviewed. The patient's tolerance of                            previous anesthesia was also reviewed. The risks                            and benefits of the procedure and the sedation                            options and risks were discussed with the patient.                            All questions were answered, and informed consent                            was obtained. Prior Anticoagulants: The patient has                            taken no previous anticoagulant or antiplatelet                            agents. ASA Grade Assessment: II - A patient with                            mild systemic disease. After reviewing the risks                            and benefits, the patient was deemed in  satisfactory condition to undergo the procedure.                           After obtaining informed consent, the colonoscope                            was passed under direct vision. Throughout the                            procedure, the patient's blood pressure, pulse, and                            oxygen saturations were monitored continuously. The                            Colonoscope was introduced  through the anus and                            advanced to the the cecum, identified by                            appendiceal orifice and ileocecal valve. The                            colonoscopy was performed without difficulty. The                            patient tolerated the procedure well. The quality                            of the bowel preparation was good. The ileocecal                            valve, appendiceal orifice, and rectum were                            photographed. Scope In: 8:50:09 AM Scope Out: 9:00:14 AM Scope Withdrawal Time: 0 hours 8 minutes 13 seconds  Total Procedure Duration: 0 hours 10 minutes 5 seconds  Findings:                 A 10 mm polyp was found in the sigmoid colon. The                            polyp was pedunculated. The polyp was removed with                            a hot snare. Resection and retrieval were complete.                           The exam was otherwise without abnormality on                            direct and retroflexion views. Complications:  No immediate complications. Estimated blood loss:                            None. Estimated Blood Loss:     Estimated blood loss: none. Impression:               - One 10 mm polyp in the sigmoid colon, removed                            with a hot snare. Resected and retrieved.                           - The examination was otherwise normal on direct                            and retroflexion views. Recommendation:           - Patient has a contact number available for                            emergencies. The signs and symptoms of potential                            delayed complications were discussed with the                            patient. Return to normal activities tomorrow.                            Written discharge instructions were provided to the                            patient.                           - Resume previous diet.                            - Continue present medications.                           - Await pathology results. Milus Banister, MD 11/01/2019 9:02:29 AM This report has been signed electronically.

## 2019-11-03 ENCOUNTER — Telehealth: Payer: Self-pay | Admitting: *Deleted

## 2019-11-03 NOTE — Telephone Encounter (Signed)
  Follow up Call-  Call back number 11/01/2019  Post procedure Call Back phone  # [phone]  Permission to leave phone message No  Some recent data might be hidden     Patient questions:  Do you have a fever, pain , or abdominal swelling? No. Pain Score  0 *  Have you tolerated food without any problems? Yes.    Have you been able to return to your normal activities? Yes.    Do you have any questions about your discharge instructions: Diet   No. Medications  No. Follow up visit  No.  Do you have questions or concerns about your Care? No.  Actions: * If pain score is 4 or above: No action needed, pain <4.  1. Have you developed a fever since your procedure? no  2.   Have you had an respiratory symptoms (SOB or cough) since your procedure? no  3.   Have you tested positive for COVID 19 since your procedure no  4.   Have you had any family members/close contacts diagnosed with the COVID 19 since your procedure?  no   If yes to any of these questions please route to Joylene John, RN and Alphonsa Gin, Therapist, sports.

## 2019-11-03 NOTE — Telephone Encounter (Signed)
  Follow up Call-  Call back number 11/01/2019  Post procedure Call Back phone  # [phone]  Permission to leave phone message No  Some recent data might be hidden     Patient questions:  VM came on.  Patient requests that we do not leave a message.

## 2019-11-07 ENCOUNTER — Encounter: Payer: Self-pay | Admitting: Gastroenterology

## 2019-11-21 MED FILL — LETROZOLE 2.5 MG TABS: 2.5 | 90 days supply | Qty: 90 | Fill #1

## 2019-12-16 MED FILL — SHINGRIX 50 MCG SUS: 50 | 1 days supply | Qty: 1 | Fill #1

## 2020-02-03 MED FILL — SHINGRIX 50 MCG SUS: 50 | 1 days supply | Qty: 1 | Fill #1

## 2020-02-17 MED FILL — SHINGRIX 50 MCG SUS: 50 | 1 days supply | Qty: 1 | Fill #0

## 2020-02-27 MED FILL — LETROZOLE 2.5 MG TABS: 2.5 | 90 days supply | Qty: 90 | Fill #0

## 2020-04-16 MED FILL — SHINGRIX 50 MCG SUS: 50 | 1 days supply | Qty: 1 | Fill #1

## 2020-05-24 MED FILL — LETROZOLE 2.5 MG TABS: 2.5 | 90 days supply | Qty: 90 | Fill #1

## 2020-09-04 NOTE — Progress Notes (Signed)
Patient Care Team: Lennie Odor, [place] as PCP - General (Nurse Practitioner) Nicholas Lose, MD as Consulting Physician (Hematology and Oncology) Kyung Rudd, MD as Consulting Physician (Radiation Oncology) Erroll Luna, MD as Consulting Physician (General Surgery)  DIAGNOSIS:    ICD-10-CM   1. Malignant neoplasm of overlapping sites of left breast in female, estrogen receptor positive ([place])  C50.812 MM DIAG BREAST TOMO BILATERAL   Z17.0     SUMMARY OF ONCOLOGIC HISTORY: Oncology History  Malignant neoplasm of overlapping sites of left breast in female, estrogen receptor positive ([place])  08/04/2018 Initial Diagnosis   Screening mammogram detected left breast mass at 9:30 position 1.2 x 1.2 x 1.1 cm.  Closer to the nipple at 9:30 position 0.8 cm lesion span 3.6 cm and a 1.5 cm apart.  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 position 2 cm from nipple, at 1 cm from nipple PASH, lymph node benign, ER 90%, PR 95%, Ki-67 20%, HER-2 negative by IHC 1+, T1CN0 stage Ia AJCC 8   09/29/2018 Surgery   Left lumpectomy: Grade 3 IDC, 1.7 cm, margins negative, lymphovascular invasion present, 0/2 lymph nodes negative, T1c N0 stage Ia   10/06/2018 Cancer Staging   Staging form: Breast, AJCC 8th Edition - Pathologic: Stage IA (pT1c, pN0(sn), cM0, G3, ER+, PR+, HER2-) - Signed by Nicholas Lose, MD on 10/06/2018   10/22/2018 Oncotype testing   Oncotype score 13: Distant recurrence at 9 years with hormone therapy alone 4%   11/12/2018 - 12/27/2018 Radiation Therapy   Adjuvant radiation therapy   12/27/2018 -  Anti-estrogen oral therapy   Antiestrogen therapy with letrozole 2.5 mg daily     CHIEF COMPLIANT: Follow-up of left breast cancer on letrozole  INTERVAL HISTORY: Ashley Clark is a 59 y.o. with above-mentioned history of left breast cancer who underwent lumpectomy, radiation, and is currently on anti-estrogen  therapy with letrozole. Mammogram on 09/16/19 showed no evidence of malignancy bilaterally. She presents to the clinic today for follow-up.    ALLERGIES:  has No Known Allergies.  MEDICATIONS:  Current Outpatient Medications  Medication Sig Dispense Refill  . calcium citrate-vitamin D (CITRACAL+D) 315-200 MG-UNIT tablet Take 1 tablet by mouth 2 (two) times daily.    . cetirizine (ZYRTEC) 10 MG chewable tablet Chew 10 mg by mouth daily.    Marland Kitchen ibuprofen (ADVIL,MOTRIN) 800 MG tablet Take 1 tablet (800 mg total) by mouth every 8 (eight) hours as needed. (Patient not taking: Reported on 11/01/2019) 30 tablet 0  . letrozole (FEMARA) 2.5 MG tablet Take 1 tablet (2.5 mg total) by mouth daily. 90 tablet 3   No current facility-administered medications for this visit.    PHYSICAL EXAMINATION: ECOG PERFORMANCE STATUS: 1 - Symptomatic but completely ambulatory  Vitals:   09/05/20 1423  BP: 133/77  Pulse: 79  Resp: 18  Temp: 98.4 F (36.9 C)  SpO2: 97%   Filed Weights   09/05/20 1423  Weight: 218 lb (98.9 kg)    BREAST: No palpable masses or nodules in either right or left breasts. No palpable axillary supraclavicular or infraclavicular adenopathy no breast tenderness or nipple discharge. (exam performed in the presence of a chaperone)  LABORATORY DATA:  I have reviewed the data as listed No flowsheet data found.  Lab Results  Component Value Date   WBC 5.0 09/27/2018   HGB 13.7 09/27/2018   HCT 43.6 09/27/2018   MCV 85.3  09/27/2018   PLT 268 09/27/2018   NEUTROABS 2.6 09/27/2018    ASSESSMENT & PLAN:  Malignant neoplasm of overlapping sites of left breast in female, estrogen receptor positive ([place]) 09/29/2018 left lumpectomy: Grade 3 IDC, 1.7 cm, margins negative, lymphovascular invasion present, 0/2 lymph nodes negative, T1c N0 stage Ia, ER 90%, PR 95%, HER-2 negative, Ki-67 20% Oncotype DX score 13: Risk of recurrence at 9 years: 4% Current treatment: 1.Adjuvant radiation  therapy 11/12/2018-12/27/2018 2.Adjuvant antiestrogen therapy: Letrozole 2.5 mg daily started 12/27/2018  Letrozole toxicities:  Moderate to severe hot flashes: Patient is managing it.  Breast cancer surveillance: Mammograms 09/16/2019: Benign breast density category C. Breast exam 09/06/2019: Left breast has post radiation darkening of tissue and firmness and is moderately tender.  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  Return to clinic in 1 year for follow-up     Orders Placed This Encounter  Procedures  . MM DIAG BREAST TOMO BILATERAL    Standing Status:   Future    Standing Expiration Date:   09/05/2021    Order Specific Question:   Reason for Exam (SYMPTOM  OR DIAGNOSIS REQUIRED)    Answer:   Annual mammograms with h/o breast cancer    Order Specific Question:   Is the patient pregnant?    Answer:   No    Order Specific Question:   Preferred imaging location?    Answer:   [facility]   The patient has a good understanding of the overall plan. she agrees with it. she will call with any problems that may develop before the next visit here.  Total time spent: 20 mins including face to face time and time spent for planning, charting and coordination of care  Nicholas Lose, MD 09/05/2020  I, Cloyde Reams Dorshimer, am acting as scribe for Dr. Nicholas Lose.  I have reviewed the above documentation for accuracy and completeness, and I agree with the above.

## 2020-09-05 ENCOUNTER — Inpatient Hospital Stay: Payer: 59 | Attending: Hematology and Oncology | Admitting: Hematology and Oncology

## 2020-09-05 ENCOUNTER — Other Ambulatory Visit: Payer: Self-pay

## 2020-09-05 DIAGNOSIS — Z170 Estrogen receptor positive status [ER+]: Secondary | ICD-10-CM | POA: Diagnosis not present

## 2020-09-05 DIAGNOSIS — Z79811 Long term (current) use of aromatase inhibitors: Secondary | ICD-10-CM | POA: Insufficient documentation

## 2020-09-05 DIAGNOSIS — C50812 Malignant neoplasm of overlapping sites of left female breast: Secondary | ICD-10-CM | POA: Insufficient documentation

## 2020-09-05 NOTE — Assessment & Plan Note (Signed)
09/29/2018 left lumpectomy: Grade 3 IDC, 1.7 cm, margins negative, lymphovascular invasion present, 0/2 lymph nodes negative, T1c N0 stage Ia, ER 90%, PR 95%, HER-2 negative, Ki-67 20% Oncotype DX score 13: Risk of recurrence at 9 years: 4% Current treatment: 1.Adjuvant radiation therapy 11/12/2018-12/27/2018 2.Adjuvant antiestrogen therapy: Letrozole 2.5 mg daily started 12/27/2018  Letrozole toxicities:  Moderate to severe hot flashes: Patient is managing it.  Breast cancer surveillance: Mammograms 09/16/2019: Benign breast density category C. Breast exam 09/06/2019: Left breast has post radiation scar tissue and is moderately tender.  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  Return to clinic in 1 year for follow-up

## 2020-09-06 ENCOUNTER — Telehealth: Payer: Self-pay | Admitting: Hematology and Oncology

## 2020-09-06 NOTE — Telephone Encounter (Signed)
Scheduled per 9/8 los. Called and spoke with pt, confirmed 9/8 appt

## 2020-10-03 ENCOUNTER — Other Ambulatory Visit: Payer: Self-pay | Admitting: Hematology and Oncology

## 2020-10-03 MED FILL — LETROZOLE 2.5 MG TABS: 2.5 | 90 days supply | Qty: 90 | Fill #0

## 2021-01-23 MED FILL — LETROZOLE 2.5 MG TABS: 2.5 | 90 days supply | Qty: 90 | Fill #1

## 2021-01-30 ENCOUNTER — Other Ambulatory Visit: Payer: Self-pay

## 2021-01-30 ENCOUNTER — Ambulatory Visit
Admission: RE | Admit: 2021-01-30 | Discharge: 2021-01-30 | Disposition: A | Discharge status: Home / Self Care | Payer: 59 | Source: Ambulatory Visit | Attending: Hematology and Oncology | Admitting: Hematology and Oncology

## 2021-01-30 DIAGNOSIS — R922 Inconclusive mammogram: Secondary | ICD-10-CM | POA: Diagnosis not present

## 2021-01-30 DIAGNOSIS — Z170 Estrogen receptor positive status [ER+]: Secondary | ICD-10-CM

## 2021-01-30 DIAGNOSIS — C50812 Malignant neoplasm of overlapping sites of left female breast: Secondary | ICD-10-CM

## 2021-01-30 HISTORY — DX: Personal history of irradiation: Z92.3

## 2021-04-09 ENCOUNTER — Other Ambulatory Visit (HOSPITAL_COMMUNITY): Payer: Self-pay

## 2021-04-09 MED FILL — Letrozole Tab 2.5 MG: ORAL | 90 days supply | Qty: 90 | Fill #0 | Status: AC

## 2021-04-10 ENCOUNTER — Other Ambulatory Visit (HOSPITAL_COMMUNITY): Payer: Self-pay

## 2021-06-03 ENCOUNTER — Other Ambulatory Visit (HOSPITAL_COMMUNITY): Payer: Self-pay

## 2021-06-03 DIAGNOSIS — L218 Other seborrheic dermatitis: Secondary | ICD-10-CM | POA: Diagnosis not present

## 2021-06-03 MED ORDER — FLUOCINOLONE ACETONIDE SCALP 0.01 % EX OIL
TOPICAL_OIL | CUTANEOUS | 2 refills | Status: DC
  Filled 2021-06-03: qty 118.28, 30d supply, fill #0
  Filled 2022-05-30: qty 118.28, 30d supply, fill #1

## 2021-06-03 MED ORDER — KETOCONAZOLE 2 % EX CREA
1.0000 "application " | TOPICAL_CREAM | Freq: Two times a day (BID) | CUTANEOUS | 2 refills | Status: DC
  Filled 2021-06-03: qty 60, 30d supply, fill #0

## 2021-06-03 MED ORDER — KETOCONAZOLE 2 % EX SHAM
MEDICATED_SHAMPOO | CUTANEOUS | 2 refills | Status: DC
  Filled 2021-06-03: qty 120, 30d supply, fill #0
  Filled 2021-12-03: qty 120, 30d supply, fill #1
  Filled 2022-05-30: qty 120, 30d supply, fill #2

## 2021-06-03 MED ORDER — FLUTICASONE PROPIONATE 0.05 % EX CREA
TOPICAL_CREAM | CUTANEOUS | 2 refills | Status: DC
  Filled 2021-06-03: qty 60, 30d supply, fill #0

## 2021-06-05 ENCOUNTER — Other Ambulatory Visit (HOSPITAL_COMMUNITY): Payer: Self-pay

## 2021-06-17 DIAGNOSIS — Z13220 Encounter for screening for lipoid disorders: Secondary | ICD-10-CM | POA: Diagnosis not present

## 2021-06-17 DIAGNOSIS — Z853 Personal history of malignant neoplasm of breast: Secondary | ICD-10-CM | POA: Diagnosis not present

## 2021-06-17 DIAGNOSIS — Z0000 Encounter for general adult medical examination without abnormal findings: Secondary | ICD-10-CM | POA: Diagnosis not present

## 2021-06-17 DIAGNOSIS — R7303 Prediabetes: Secondary | ICD-10-CM | POA: Diagnosis not present

## 2021-06-17 DIAGNOSIS — E669 Obesity, unspecified: Secondary | ICD-10-CM | POA: Diagnosis not present

## 2021-07-05 ENCOUNTER — Other Ambulatory Visit (HOSPITAL_COMMUNITY): Payer: Self-pay

## 2021-07-05 MED FILL — Letrozole Tab 2.5 MG: ORAL | 90 days supply | Qty: 90 | Fill #1 | Status: AC

## 2021-09-04 NOTE — Progress Notes (Signed)
Patient Care Team: Lennie Odor, [place] as PCP - General (Nurse Practitioner) Nicholas Lose, MD as Consulting Physician (Hematology and Oncology) Kyung Rudd, MD as Consulting Physician (Radiation Oncology) Erroll Luna, MD as Consulting Physician (General Surgery)  DIAGNOSIS:    ICD-10-CM   1. Malignant neoplasm of overlapping sites of left breast in female, estrogen receptor positive ([place])  C50.812    Z17.0       SUMMARY OF ONCOLOGIC HISTORY: Oncology History  Malignant neoplasm of overlapping sites of left breast in female, estrogen receptor positive ([place])  08/04/2018 Initial Diagnosis   Screening mammogram detected left breast mass at 9:30 position 1.2 x 1.2 x 1.1 cm.  Closer to the nipple at 9:30 position 0.8 cm lesion span 3.6 cm and a 1.5 cm apart.  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 position 2 cm from nipple, at 1 cm from nipple PASH, lymph node benign, ER 90%, PR 95%, Ki-67 20%, HER-2 negative by IHC 1+, T1CN0 stage Ia AJCC 8   09/29/2018 Surgery   Left lumpectomy: Grade 3 IDC, 1.7 cm, margins negative, lymphovascular invasion present, 0/2 lymph nodes negative, T1c N0 stage Ia   10/06/2018 Cancer Staging   Staging form: Breast, AJCC 8th Edition - Pathologic: Stage IA (pT1c, pN0(sn), cM0, G3, ER+, PR+, HER2-) - Signed by Nicholas Lose, MD on 10/06/2018   10/22/2018 Oncotype testing   Oncotype score 13: Distant recurrence at 9 years with hormone therapy alone 4%   11/12/2018 - 12/27/2018 Radiation Therapy   Adjuvant radiation therapy   12/27/2018 -  Anti-estrogen oral therapy   Antiestrogen therapy with letrozole 2.5 mg daily     CHIEF COMPLIANT: Follow-up of left breast cancer on letrozole  INTERVAL HISTORY: Ashley Clark is a 60 y.o. with above-mentioned history of left breast cancer who underwent lumpectomy, radiation, and is currently on anti-estrogen therapy with letrozole.  Mammogram on 01/30/2021 showed no evidence of malignancy bilaterally. She presents to the clinic today for follow-up.  She reports no pain or discomfort in the breast.  She is tolerating letrozole fairly well.  Her major complaint is profound weight gain.  ALLERGIES:  has No Known Allergies.  MEDICATIONS:  Current Outpatient Medications  Medication Sig Dispense Refill   calcium citrate-vitamin D (CITRACAL+D) 315-200 MG-UNIT tablet Take 1 tablet by mouth 2 (two) times daily.     cetirizine (ZYRTEC) 10 MG chewable tablet Chew 10 mg by mouth daily.     Fluocinolone Acetonide Scalp 0.01 % OIL Apply topically to scalp twice daily as needed for flares. 118.28 mL 2   fluticasone (CUTIVATE) 0.05 % cream Apply topically to affected areas on face twice daily for 1 week then off a week as needed. 60 g 2   ibuprofen (ADVIL,MOTRIN) 800 MG tablet Take 1 tablet (800 mg total) by mouth every 8 (eight) hours as needed. (Patient not taking: Reported on 11/01/2019) 30 tablet 0   ketoconazole (NIZORAL) 2 % cream Apply 1 application topically to affected areas on face 2 (two) times daily for 3 weeks then as needed. 60 g 2   ketoconazole (NIZORAL) 2 % shampoo Wash scalp and leave in 3 to 60mnutes. Wash scalp as normal. 120 mL 2   letrozole (FEMARA) 2.5 MG tablet TAKE 1 TABLET (2.5 MG TOTAL) BY MOUTH DAILY. 90 tablet 3   No current facility-administered medications for this visit.    PHYSICAL EXAMINATION: ECOG PERFORMANCE STATUS:  1 - Symptomatic but completely ambulatory  Vitals:   09/05/21 1355  BP: 113/62  Pulse: 70  Resp: 18  Temp: 97.8 F (36.6 C)  SpO2: 97%   Filed Weights   09/05/21 1355  Weight: 221 lb 6.4 oz (100.4 kg)       LABORATORY DATA:  I have reviewed the data as listed No flowsheet data found.  Lab Results  Component Value Date   WBC 5.0 09/27/2018   HGB 13.7 09/27/2018   HCT 43.6 09/27/2018   MCV 85.3 09/27/2018   PLT 268 09/27/2018   NEUTROABS 2.6 09/27/2018     ASSESSMENT & PLAN:  Malignant neoplasm of overlapping sites of left breast in female, estrogen receptor positive ([place]) 09/29/2018 left lumpectomy: Grade 3 IDC, 1.7 cm, margins negative, lymphovascular invasion present, 0/2 lymph nodes negative, T1c N0 stage Ia, ER 90%, PR 95%, HER-2 negative, Ki-67 20% Oncotype DX score 13: Risk of recurrence at 9 years: 4%  Current treatment:  1. Adjuvant radiation therapy 11/12/2018-12/27/2018 2.  Adjuvant antiestrogen therapy: Letrozole 2.5 mg daily started 12/27/2018   Letrozole toxicities:  Moderate to severe hot flashes: Patient is managing it. Profound weight gain: Patient tells me that she does not eat carbs but still she gains weight.  I will refer her to the weight loss clinic.  Breast cancer surveillance:  Mammograms 01/30/2021: Benign breast density category C.     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      Return to clinic in 1 year for follow-up    No orders of the defined types were placed in this encounter.  The patient has a good understanding of the overall plan. she agrees with it. she will call with any problems that may develop before the next visit here.  Total time spent: 20 mins including face to face time and time spent for planning, charting and coordination of care  Rulon Eisenmenger, MD, MPH 09/05/2021  I, Thana Ates, am acting as scribe for Dr. Nicholas Lose.  I have reviewed the above documentation for accuracy and completeness, and I agree with the above.

## 2021-09-05 ENCOUNTER — Inpatient Hospital Stay: Payer: 59 | Attending: Hematology and Oncology | Admitting: Hematology and Oncology

## 2021-09-05 ENCOUNTER — Other Ambulatory Visit: Payer: Self-pay | Admitting: *Deleted

## 2021-09-05 ENCOUNTER — Other Ambulatory Visit: Payer: Self-pay

## 2021-09-05 VITALS — BP 113/62 | HR 70 | Temp 97.8°F | Resp 18 | Ht 61.0 in | Wt 221.4 lb

## 2021-09-05 DIAGNOSIS — Z170 Estrogen receptor positive status [ER+]: Secondary | ICD-10-CM

## 2021-09-05 DIAGNOSIS — Z79811 Long term (current) use of aromatase inhibitors: Secondary | ICD-10-CM | POA: Insufficient documentation

## 2021-09-05 DIAGNOSIS — C50812 Malignant neoplasm of overlapping sites of left female breast: Secondary | ICD-10-CM | POA: Insufficient documentation

## 2021-09-05 DIAGNOSIS — Z780 Asymptomatic menopausal state: Secondary | ICD-10-CM | POA: Diagnosis not present

## 2021-09-05 NOTE — Assessment & Plan Note (Signed)
09/29/2018 left lumpectomy: Grade 3 IDC, 1.7 cm, margins negative, lymphovascular invasion present, 0/2 lymph nodes negative, T1c N0 stage Ia, ER 90%, PR 95%, HER-2 negative, Ki-67 20% Oncotype DX score 13: Risk of recurrence at 9 years: 4%  Current treatment: 1.Adjuvant radiation therapy 11/12/2018-12/27/2018 2.Adjuvant antiestrogen therapy: Letrozole 2.5 mg daily started 12/27/2018  Letrozoletoxicities: Moderate to severe hot flashes: Patient is managing it.  Breast cancer surveillance:  Mammograms 01/30/2021: Benign breast density category C. Breast exam 09/05/2021: Left breast has post radiation darkening of tissue and firmness and is moderately tender.  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  Return to clinic in1 year for follow-up

## 2021-09-05 NOTE — Progress Notes (Unsigned)
Per MD request RN placed referral to healthy weight and wellness.

## 2021-09-19 ENCOUNTER — Other Ambulatory Visit (HOSPITAL_COMMUNITY): Payer: Self-pay

## 2021-09-19 ENCOUNTER — Other Ambulatory Visit: Payer: Self-pay | Admitting: Hematology and Oncology

## 2021-09-19 MED ORDER — LETROZOLE 2.5 MG PO TABS
2.5000 mg | ORAL_TABLET | Freq: Every day | ORAL | 3 refills | Status: DC
  Filled 2021-09-19: qty 90, 90d supply, fill #0
  Filled 2022-06-24: qty 90, 90d supply, fill #1

## 2021-12-03 ENCOUNTER — Other Ambulatory Visit (HOSPITAL_COMMUNITY): Payer: Self-pay

## 2022-01-10 ENCOUNTER — Other Ambulatory Visit (HOSPITAL_BASED_OUTPATIENT_CLINIC_OR_DEPARTMENT_OTHER): Payer: Self-pay

## 2022-01-10 ENCOUNTER — Ambulatory Visit: Payer: 59 | Attending: Internal Medicine

## 2022-01-10 DIAGNOSIS — Z23 Encounter for immunization: Secondary | ICD-10-CM

## 2022-01-10 MED ORDER — PFIZER COVID-19 VAC BIVALENT 30 MCG/0.3ML IM SUSP
INTRAMUSCULAR | 0 refills | Status: DC
  Filled 2022-01-10: qty 0.3, 1d supply, fill #0

## 2022-01-10 NOTE — Progress Notes (Signed)
° °  Covid-19 Vaccination Clinic  Name:  Ashley Clark    MRN: [id number] DOB: 1961/01/17  01/10/2022  [name] was observed post Covid-19 immunization for 15 minutes without incident. She was provided with Vaccine Information Sheet and instruction to access the V-Safe system.   [name] was instructed to call 911 with any severe reactions post vaccine: Difficulty breathing  Swelling of face and throat  A fast heartbeat  A bad rash all over body  Dizziness and weakness   Immunizations Administered     Name Date Dose VIS Date Route   Pfizer Covid-19 Vaccine Bivalent Booster 01/10/2022  1:49 PM 0.3 mL 08/28/2021 Intramuscular   Manufacturer: [place]   Lot: [id number]   [place]: [phone]

## 2022-01-24 ENCOUNTER — Other Ambulatory Visit: Payer: Self-pay | Admitting: Physician Assistant

## 2022-01-24 DIAGNOSIS — Z1231 Encounter for screening mammogram for malignant neoplasm of breast: Secondary | ICD-10-CM

## 2022-01-31 ENCOUNTER — Ambulatory Visit
Admission: RE | Admit: 2022-01-31 | Discharge: 2022-01-31 | Disposition: A | Discharge status: Home / Self Care | Payer: 59 | Source: Ambulatory Visit | Attending: Physician Assistant | Admitting: Physician Assistant

## 2022-01-31 DIAGNOSIS — Z1231 Encounter for screening mammogram for malignant neoplasm of breast: Secondary | ICD-10-CM

## 2022-02-10 ENCOUNTER — Other Ambulatory Visit: Payer: 59

## 2022-05-01 DIAGNOSIS — Z0289 Encounter for other administrative examinations: Secondary | ICD-10-CM

## 2022-05-30 ENCOUNTER — Other Ambulatory Visit (HOSPITAL_COMMUNITY): Payer: Self-pay

## 2022-06-10 ENCOUNTER — Other Ambulatory Visit (INDEPENDENT_AMBULATORY_CARE_PROVIDER_SITE_OTHER): Payer: Self-pay | Admitting: Family Medicine

## 2022-06-10 ENCOUNTER — Ambulatory Visit (INDEPENDENT_AMBULATORY_CARE_PROVIDER_SITE_OTHER): Payer: 59 | Admitting: Family Medicine

## 2022-06-10 ENCOUNTER — Encounter (INDEPENDENT_AMBULATORY_CARE_PROVIDER_SITE_OTHER): Payer: Self-pay | Admitting: Family Medicine

## 2022-06-10 VITALS — BP 127/87 | HR 70 | Temp 97.9°F | Ht 63.0 in | Wt 219.0 lb

## 2022-06-10 DIAGNOSIS — R5383 Other fatigue: Secondary | ICD-10-CM | POA: Diagnosis not present

## 2022-06-10 DIAGNOSIS — Z853 Personal history of malignant neoplasm of breast: Secondary | ICD-10-CM | POA: Diagnosis not present

## 2022-06-10 DIAGNOSIS — R7303 Prediabetes: Secondary | ICD-10-CM | POA: Diagnosis not present

## 2022-06-10 DIAGNOSIS — R0602 Shortness of breath: Secondary | ICD-10-CM

## 2022-06-10 DIAGNOSIS — R7301 Impaired fasting glucose: Secondary | ICD-10-CM | POA: Diagnosis not present

## 2022-06-10 DIAGNOSIS — Z6838 Body mass index (BMI) 38.0-38.9, adult: Secondary | ICD-10-CM | POA: Diagnosis not present

## 2022-06-10 DIAGNOSIS — Z1331 Encounter for screening for depression: Secondary | ICD-10-CM

## 2022-06-10 DIAGNOSIS — E559 Vitamin D deficiency, unspecified: Secondary | ICD-10-CM

## 2022-06-10 DIAGNOSIS — Z9189 Other specified personal risk factors, not elsewhere classified: Secondary | ICD-10-CM

## 2022-06-10 DIAGNOSIS — E7849 Other hyperlipidemia: Secondary | ICD-10-CM | POA: Diagnosis not present

## 2022-06-10 DIAGNOSIS — E668 Other obesity: Secondary | ICD-10-CM

## 2022-06-12 LAB — COMPREHENSIVE METABOLIC PANEL
ALT: 20 IU/L (ref 0–32)
AST: 20 IU/L (ref 0–40)
Albumin/Globulin Ratio: 1.5 (ref 1.2–2.2)
Albumin: 4.6 g/dL (ref 3.8–4.9)
Alkaline Phosphatase: 151 IU/L — ABNORMAL HIGH (ref 44–121)
BUN/Creatinine Ratio: 10 — ABNORMAL LOW (ref 12–28)
BUN: 7 mg/dL — ABNORMAL LOW (ref 8–27)
Bilirubin Total: 0.3 mg/dL (ref 0.0–1.2)
CO2: 23 mmol/L (ref 20–29)
Calcium: 9.7 mg/dL (ref 8.7–10.3)
Chloride: 97 mmol/L (ref 96–106)
Creatinine, Ser: 0.69 mg/dL (ref 0.57–1.00)
Globulin, Total: 3 g/dL (ref 1.5–4.5)
Glucose: 132 mg/dL — ABNORMAL HIGH (ref 70–99)
Potassium: 4.6 mmol/L (ref 3.5–5.2)
Sodium: 136 mmol/L (ref 134–144)
Total Protein: 7.6 g/dL (ref 6.0–8.5)
eGFR: 99 mL/min/{1.73_m2} (ref >59)

## 2022-06-12 LAB — VITAMIN D 25 HYDROXY (VIT D DEFICIENCY, FRACTURES): Vit D, 25-Hydroxy: 10.9 ng/mL — ABNORMAL LOW (ref 30.0–100.0)

## 2022-06-12 LAB — LIPID PANEL
Chol/HDL Ratio: 3.2 ratio (ref 0.0–4.4)
Cholesterol, Total: 209 mg/dL — ABNORMAL HIGH (ref 100–199)
HDL: 65 mg/dL (ref >39)
LDL Chol Calc (NIH): 132 mg/dL — ABNORMAL HIGH (ref 0–99)
Triglycerides: 67 mg/dL (ref 0–149)
VLDL Cholesterol Cal: 12 mg/dL (ref 5–40)

## 2022-06-12 LAB — HEMOGLOBIN A1C
Est. average glucose Bld gHb Est-mCnc: 148 mg/dL
Hgb A1c MFr Bld: 6.8 % — ABNORMAL HIGH (ref 4.8–5.6)

## 2022-06-12 LAB — INSULIN, RANDOM: INSULIN: 45.1 u[IU]/mL — ABNORMAL HIGH (ref 2.6–24.9)

## 2022-06-16 NOTE — Progress Notes (Signed)
Chief Complaint:   OBESITY Ashley Clark (MR# [id number]) is a 61 y.o. female who presents for evaluation and treatment of obesity and related comorbidities. Current BMI is Body mass index is 38.79 kg/m. Vanshika has been struggling with her weight for many years and has been unsuccessful in either losing weight, maintaining weight loss, or reaching her healthy weight goal.  Zelie is currently in the action stage of change and ready to dedicate time achieving and maintaining a healthier weight. Stacie is interested in becoming our patient and working on intensive lifestyle modifications including (but not limited to) diet and exercise for weight loss.  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 Pt craves potatoes and skips breakfast daily. She drinks caloric beverages; wine 3 times a week. Her worst habit is coffee with creamer.  Trystyn's habits were reviewed today and are as follows: Her family eats meals together, she thinks her family will eat healthier with her, her desired weight loss is 69 lbs, she started gaining weight about 7 years ago, her heaviest weight ever was 219 pounds, she has significant food cravings issues, she skips meals frequently, she is frequently drinking liquids with calories, and she struggles with emotional eating.  Depression Screen Deniss's Food and Mood (modified PHQ-9) score was 3.     06/10/2022    8:03 AM  Depression screen PHQ 2/9  Decreased Interest 0  Down, Depressed, Hopeless 1  PHQ - 2 Score 1  Altered sleeping 1  Tired, decreased energy 1  Change in appetite 0  Feeling bad or failure about yourself  0  Trouble concentrating 0  Moving slowly or fidgety/restless 0  Suicidal thoughts 0  PHQ-9 Score 3  Difficult doing work/chores Not difficult at all   Subjective:   1. Other fatigue Andreya admits to daytime somnolence and admits to waking up still tired. Patient has a history of symptoms of daytime fatigue and morning  fatigue. Reagyn generally gets 3 hours of sleep per night, and states that she has poor sleep quality. Snoring is present. Apneic episodes are not present. Epworth Sleepiness Score is 3- within normal limits. Pt chronically feels tired. She denies emotional eating as an issue and declines referral to Dr. Mallie Mussel.   2. SOB (shortness of breath) on exertion Trent notes increasing shortness of breath with exercising and seems to be worsening over time with weight gain. She notes getting out of breath sooner with activity than she used to. This has gotten worse recently. Aldine denies shortness of breath at rest or orthopnea.  3. Prediabetes Pt was first diagnosed with pre-diabetes 10 years ago. She is not sure of her last A1c result with her [facility] PCP, but it was done about a month ago. Medication: None  4. Vitamin D deficiency She is currently taking OTC vitamin D 200 IU with 315 mg of calcium each day. She denies nausea, vomiting or muscle weakness.  5. History of breast cancer Pt is status post radiation and lumpectomy of left breast in 2019. She is now back to annual mammograms.  6. At risk of diabetes mellitus Kaylon is at risk for diabetes due to having pre-diabetes for 10 years.  Assessment/Plan:   Orders Placed This Encounter  Procedures   Vitamin B12   CBC with Differential/Platelet   Comprehensive metabolic panel   Hemoglobin A1c   Insulin, random   Folate   Lipid Panel With LDL/HDL Ratio   T4, free   TSH  VITAMIN D 25 Hydroxy (Vit-D Deficiency, Fractures)   EKG 12-Lead    There are no discontinued medications.   No orders of the defined types were placed in this encounter.    1. Other fatigue Hendy does feel that her weight is causing her energy to be lower than it should be. Fatigue may be related to obesity, depression or many other causes. Labs will be ordered, and in the meanwhile, Adisyn will focus on self care including making healthy food choices, increasing  physical activity and focusing on stress reduction. Obtain fasting labs today.  - EKG 12-Lead - Vitamin B12 - Folate  2. SOB (shortness of breath) on exertion Valeen does feel that she gets out of breath more easily that she used to when she exercises. Christabel's shortness of breath appears to be obesity related and exercise induced. She has agreed to work on weight loss and gradually increase exercise to treat her exercise induced shortness of breath. Will continue to monitor closely.  3. Prediabetes Dajana will continue to work on weight loss, exercise, and decreasing simple carbohydrates to help decrease the risk of diabetes. Obtain fasting labs today.  - Hemoglobin A1c - Insulin, random - T4, free - TSH  4. Vitamin D deficiency Low Vitamin D level contributes to fatigue and are associated with obesity, breast, and colon cancer. She agrees to continue to take OTC Vitamin D 200 IU with 315 mg of calcium daily and will follow-up for routine testing of Vitamin D, at least 2-3 times per year to avoid over-replacement. Obtain fasting labs today.  - VITAMIN D 25 Hydroxy (Vit-D Deficiency, Fractures)  5. History of breast cancer Continue surveillance per oncology doctor's and lose weight.    - CBC with Differential/Platelet - Comprehensive metabolic panel - Lipid Panel With LDL/HDL Ratio - T4, free - TSH  6. Depression screening Deva had a positive depression screening. Depression is commonly associated with obesity and often results in emotional eating behaviors. We will monitor this closely and work on CBT to help improve the non-hunger eating patterns. Referral to Psychology may be required if no improvement is seen as she continues in our clinic.  7. At risk of diabetes mellitus - Alazia was given diabetes prevention education and counseling today of more than 24 minutes.  - Counseled patient on pathophysiology of disease and meaning/ implication of lab results.  - Reviewed how  certain foods can either stimulate or inhibit insulin release, and subsequently affect hunger pathways  - Importance of following a healthy meal plan with limiting amounts of simple carbohydrates discussed with patient - Effects of regular aerobic exercise on blood sugar regulation reviewed and encouraged an eventual goal of 30 min 5d/week or more as a minimum.  - Briefly discussed treatment options, which always include dietary and lifestyle modification as first line.   - Handouts provided at patient's desire and/or told to go online to the American Diabetes Association website for further information.  8. Class 2 severe obesity with serious comorbidity and body mass index (BMI) of 38.0 to 38.9 in adult, unspecified obesity type (HCC) Debbi is currently in the action stage of change and her goal is to continue with weight loss efforts. I recommend Peggie begin the structured treatment plan as follows:  She has agreed to the Category 1 Plan.  Exercise goals:  As is    Behavioral modification strategies: avoiding temptations and planning for success.  She was informed of the importance of frequent follow-up visits to maximize her  success with intensive lifestyle modifications for her multiple health conditions. She was informed we would discuss her lab results at her next visit unless there is a critical issue that needs to be addressed sooner. Suezette agreed to keep her next visit at the agreed upon time to discuss these results.  Objective:   Blood pressure 127/87, pulse 70, temperature 97.9 F (36.6 C), height '5\' 3"'$  (1.6 m), weight 219 lb (99.3 kg), last menstrual period 03/12/2015, SpO2 95 %. Body mass index is 38.79 kg/m.  EKG: Normal sinus rhythm, rate 74.  Indirect Calorimeter completed today shows a VO2 of 207 and a REE of 1426.  Her calculated basal metabolic rate is 1007 thus her basal metabolic rate is worse than expected.  General: Cooperative, alert, well developed, in no  acute distress. HEENT: Conjunctivae and lids unremarkable. Cardiovascular: Regular rhythm.  Lungs: Normal work of breathing. Neurologic: No focal deficits.   Lab Results  Component Value Date   CREATININE 0.69 06/10/2022   BUN 7 (L) 06/10/2022   NA 136 06/10/2022   K 4.6 06/10/2022   CL 97 06/10/2022   CO2 23 06/10/2022   Lab Results  Component Value Date   ALT 20 06/10/2022   AST 20 06/10/2022   ALKPHOS 151 (H) 06/10/2022   BILITOT 0.3 06/10/2022   Lab Results  Component Value Date   HGBA1C 6.8 (H) 06/10/2022   Lab Results  Component Value Date   INSULIN 45.1 (H) 06/10/2022   No results found for: "TSH" Lab Results  Component Value Date   CHOL 209 (H) 06/10/2022   HDL 65 06/10/2022   LDLCALC 132 (H) 06/10/2022   TRIG 67 06/10/2022   CHOLHDL 3.2 06/10/2022   Lab Results  Component Value Date   WBC 5.0 09/27/2018   HGB 13.7 09/27/2018   HCT 43.6 09/27/2018   MCV 85.3 09/27/2018   PLT 268 09/27/2018    Attestation Statements:   Reviewed by clinician on day of visit: allergies, medications, problem list, medical history, surgical history, family history, social history, and previous encounter notes.  I, Kathlene November, BS, CMA, am acting as transcriptionist for [employer], DO.  I have reviewed the above documentation for accuracy and completeness, and I agree with the above. Marjory Sneddon, D.O.  The [place] was signed into law in 2016 which includes the topic of electronic health records.  This provides immediate access to information in MyChart.  This includes consultation notes, operative notes, office notes, lab results and pathology reports.  If you have any questions about what you read please let us know at your next visit so we can discuss your concerns and take corrective action if need be.  We are right here with you.

## 2022-06-24 ENCOUNTER — Other Ambulatory Visit (HOSPITAL_COMMUNITY): Payer: Self-pay

## 2022-06-24 ENCOUNTER — Encounter (INDEPENDENT_AMBULATORY_CARE_PROVIDER_SITE_OTHER): Payer: Self-pay | Admitting: Family Medicine

## 2022-06-24 ENCOUNTER — Ambulatory Visit (INDEPENDENT_AMBULATORY_CARE_PROVIDER_SITE_OTHER): Payer: 59 | Admitting: Family Medicine

## 2022-06-24 VITALS — BP 126/83 | HR 62 | Temp 97.9°F | Ht 63.0 in | Wt 213.0 lb

## 2022-06-24 DIAGNOSIS — E669 Obesity, unspecified: Secondary | ICD-10-CM | POA: Diagnosis not present

## 2022-06-24 DIAGNOSIS — E785 Hyperlipidemia, unspecified: Secondary | ICD-10-CM

## 2022-06-24 DIAGNOSIS — Z6837 Body mass index (BMI) 37.0-37.9, adult: Secondary | ICD-10-CM

## 2022-06-24 DIAGNOSIS — E1169 Type 2 diabetes mellitus with other specified complication: Secondary | ICD-10-CM

## 2022-06-24 DIAGNOSIS — Z9189 Other specified personal risk factors, not elsewhere classified: Secondary | ICD-10-CM

## 2022-06-24 DIAGNOSIS — E559 Vitamin D deficiency, unspecified: Secondary | ICD-10-CM

## 2022-06-24 DIAGNOSIS — E1159 Type 2 diabetes mellitus with other circulatory complications: Secondary | ICD-10-CM

## 2022-06-24 MED ORDER — VITAMIN D (ERGOCALCIFEROL) 1.25 MG (50000 UNIT) PO CAPS
50000.0000 [IU] | ORAL_CAPSULE | ORAL | 0 refills | Status: DC
  Filled 2022-06-24: qty 4, 28d supply, fill #0

## 2022-06-25 DIAGNOSIS — E559 Vitamin D deficiency, unspecified: Secondary | ICD-10-CM | POA: Diagnosis not present

## 2022-06-25 DIAGNOSIS — E1169 Type 2 diabetes mellitus with other specified complication: Secondary | ICD-10-CM | POA: Insufficient documentation

## 2022-06-25 DIAGNOSIS — Z0000 Encounter for general adult medical examination without abnormal findings: Secondary | ICD-10-CM | POA: Diagnosis not present

## 2022-06-25 DIAGNOSIS — Z124 Encounter for screening for malignant neoplasm of cervix: Secondary | ICD-10-CM | POA: Diagnosis not present

## 2022-06-25 DIAGNOSIS — Z1211 Encounter for screening for malignant neoplasm of colon: Secondary | ICD-10-CM | POA: Diagnosis not present

## 2022-06-25 DIAGNOSIS — Z9189 Other specified personal risk factors, not elsewhere classified: Secondary | ICD-10-CM | POA: Insufficient documentation

## 2022-06-25 DIAGNOSIS — Z853 Personal history of malignant neoplasm of breast: Secondary | ICD-10-CM | POA: Diagnosis not present

## 2022-06-25 DIAGNOSIS — E119 Type 2 diabetes mellitus without complications: Secondary | ICD-10-CM | POA: Insufficient documentation

## 2022-06-25 NOTE — Progress Notes (Unsigned)
Chief Complaint:   OBESITY Ashley Clark is here to discuss her progress with her obesity treatment plan along with follow-up of her obesity related diagnoses. Ashley Clark is on the Category 1 Plan and states she is following her eating plan approximately 70% of the time. Ashley Clark states she is not exercising.  Today's visit was #: 2 Starting weight: 219 lbs Starting date: 06/10/2022 Today's weight: 213 lbs Today's date: 06/24/2022 Total lbs lost to date: 6 lbs Total lbs lost since last in-office visit: 6 lbs  Interim History: Ashley Clark is here today for her first follow-up office visit since starting the program with [place].  All blood work/ lab tests that were recently ordered by myself or an outside provider were reviewed with patient today per their request.   Extended time was spent counseling her on all new disease processes that were discovered or preexisting ones that are affected by BMI.  she understands that many of these abnormalities will need to monitored regularly along with the current treatment plan of prudent dietary changes, in which we are making each and every office visit, to improve these health parameters.  Ashley Clark went out of town Thursday until Sunday this past weekend to [place].  She denies hunger or cravings.  Only half of her labs were drawn.      Subjective:   1. Type 2 diabetes mellitus with other circulatory complication, without long-term current use of insulin (HCC) New diagnosis, discussed labs with patient today. Ashley Clark has a history of prediabetes, but now A1c is 6.8. She denies polyuria or excessive thirst.  Ashley Clark is asymptomatic.  She denies any carb cravings while on plan.   2. Vitamin D deficiency New diagnosis, discussed labs with patient today. Ashley Clark complains of fatigue, achiness in bones and occasionally her muscles at times.  3. Hyperlipidemia associated with type 2 diabetes mellitus ([place]) New diagnosis, discussed labs with patient  today. Ashley Clark has historically eaten fattier cuts of meat and fried foods, butter, etc.  4. At risk for heart disease Ashley Clark is at higher than average risk for cardiovascular disease due to obesity, and new onset hyperlipidemia and diabetes mellitus.     Assessment/Plan:  No orders of the defined types were placed in this encounter.   There are no discontinued medications.   Meds ordered this encounter  Medications   Vitamin D, Ergocalciferol, (DRISDOL) 1.25 MG (50000 UNIT) CAPS capsule    Sig: Take 1 capsule (50,000 Units total) by mouth every 7 (seven) days.    Dispense:  4 capsule    Refill:  0     1. Type 2 diabetes mellitus with other circulatory complication, without long-term current use of insulin ([place]) Ashley Clark would like to avoid medications if possible.  She has an appointment with her PCP in the very near future to discuss chronic diagnosis management.  She will continue prudent nutritional plan and weight loss.  Recheck labs in 3 months.  2. Vitamin D deficiency Start - Vitamin D, Ergocalciferol, (DRISDOL) 1.25 MG (50000 UNIT) CAPS capsule; Take 1 capsule (50,000 Units total) by mouth every 7 (seven) days.  Dispense: 4 capsule; Refill: 0  3. Hyperlipidemia associated with type 2 diabetes mellitus (HCC) Increased LDL above goal of now <70 with her new diagnosis of diabetes mellitus.  Ashley Clark will follow prudent nutritional plan with low saturated fats.  She will also discuss with PCP if she needs a statin or not.  4. At risk for heart disease At least 25 minutes  were spent on counseling Ashley Clark about these concerns today, and we discussed the importance of reversing risks factors of obesity, especially truncal and visceral fat, hypertension, hyperlipidemia, and pre-diabetes.  The initial goal is to lose at least 5-10% of starting weight to help reduce these risk factors.  Counseling: Intensive lifestyle modifications were discussed with Ashley Clark as the most  appropriate first line of treatment.  she will continue to work on diet, exercise, and weight loss efforts.  We will continue to reassess these conditions on a fairly regular basis in an attempt to decrease the patient's overall morbidity and mortality.  Evidence-based interventions for health behavior change were utilized today including the discussion of self monitoring techniques, problem-solving barriers, and SMART goal setting techniques.  Specifically, regarding patient's less desirable eating habits and patterns, we employed the technique of small changes when Ashley Clark has not been able to fully commit to her prudent nutritional plan.    5. Obesity, current BMI 37.8 Handouts given on Insulin Resistance today.  Ashley Clark is currently in the action stage of change. As such, her goal is to continue with weight loss efforts. She has agreed to the Category 1 Plan.   Exercise goals:  As is.  Behavioral modification strategies: increasing lean protein intake, decreasing simple carbohydrates, meal planning and cooking strategies, and planning for success.  Ashley Clark has agreed to follow-up with our clinic in 2-3 weeks. She was informed of the importance of frequent follow-up visits to maximize her success with intensive lifestyle modifications for her multiple health conditions.   Objective:   Blood pressure 126/83, pulse 62, temperature 97.9 F (36.6 C), height '5\' 3"'$  (1.6 m), weight 213 lb (96.6 kg), last menstrual period 03/12/2015, SpO2 95 %. Body mass index is 37.73 kg/m.  General: Cooperative, alert, well developed, in no acute distress. HEENT: Conjunctivae and lids unremarkable. Cardiovascular: Regular rhythm.  Lungs: Normal work of breathing. Neurologic: No focal deficits.   Lab Results  Component Value Date   CREATININE 0.69 06/10/2022   BUN 7 (L) 06/10/2022   NA 136 06/10/2022   K 4.6 06/10/2022   CL 97 06/10/2022   CO2 23 06/10/2022   Lab Results  Component Value  Date   ALT 20 06/10/2022   AST 20 06/10/2022   ALKPHOS 151 (H) 06/10/2022   BILITOT 0.3 06/10/2022   Lab Results  Component Value Date   HGBA1C 6.8 (H) 06/10/2022   Lab Results  Component Value Date   INSULIN 45.1 (H) 06/10/2022   No results found for: "TSH" Lab Results  Component Value Date   CHOL 209 (H) 06/10/2022   HDL 65 06/10/2022   LDLCALC 132 (H) 06/10/2022   TRIG 67 06/10/2022   CHOLHDL 3.2 06/10/2022   Lab Results  Component Value Date   VD25OH 10.9 (L) 06/10/2022   Lab Results  Component Value Date   WBC 5.0 09/27/2018   HGB 13.7 09/27/2018   HCT 43.6 09/27/2018   MCV 85.3 09/27/2018   PLT 268 09/27/2018   No results found for: "IRON", "TIBC", "FERRITIN"   Attestation Statements:   Reviewed by clinician on day of visit: allergies, medications, problem list, medical history, surgical history, family history, social history, and previous encounter notes.  I, Davy Pique, RMA, am acting as Location manager for [employer], DO.  I have reviewed the above documentation for accuracy and completeness, and I agree with the above. Marjory Sneddon, D.O.  The 21st Century Cures Act was signed into  law in 2016 which includes the topic of electronic health records.  This provides immediate access to information in MyChart.  This includes consultation notes, operative notes, office notes, lab results and pathology reports.  If you have any questions about what you read please let us know at your next visit so we can discuss your concerns and take corrective action if need be.  We are right here with you.

## 2022-06-26 ENCOUNTER — Other Ambulatory Visit (HOSPITAL_COMMUNITY): Payer: Self-pay

## 2022-06-26 MED ORDER — LOSARTAN POTASSIUM 25 MG PO TABS
25.0000 mg | ORAL_TABLET | Freq: Every day | ORAL | 3 refills | Status: DC
  Filled 2022-06-26: qty 90, 90d supply, fill #0
  Filled 2022-10-08: qty 90, 90d supply, fill #1
  Filled 2023-06-10: qty 90, 90d supply, fill #2

## 2022-07-15 ENCOUNTER — Encounter (INDEPENDENT_AMBULATORY_CARE_PROVIDER_SITE_OTHER): Payer: Self-pay | Admitting: Family Medicine

## 2022-07-15 ENCOUNTER — Ambulatory Visit (INDEPENDENT_AMBULATORY_CARE_PROVIDER_SITE_OTHER): Payer: 59 | Admitting: Family Medicine

## 2022-07-15 ENCOUNTER — Other Ambulatory Visit (HOSPITAL_COMMUNITY): Payer: Self-pay

## 2022-07-15 VITALS — BP 115/78 | HR 62 | Temp 98.7°F | Ht 63.0 in | Wt 207.0 lb

## 2022-07-15 DIAGNOSIS — E1159 Type 2 diabetes mellitus with other circulatory complications: Secondary | ICD-10-CM | POA: Diagnosis not present

## 2022-07-15 DIAGNOSIS — Z6836 Body mass index (BMI) 36.0-36.9, adult: Secondary | ICD-10-CM | POA: Diagnosis not present

## 2022-07-15 DIAGNOSIS — I152 Hypertension secondary to endocrine disorders: Secondary | ICD-10-CM | POA: Diagnosis not present

## 2022-07-15 DIAGNOSIS — E1169 Type 2 diabetes mellitus with other specified complication: Secondary | ICD-10-CM

## 2022-07-15 DIAGNOSIS — E669 Obesity, unspecified: Secondary | ICD-10-CM | POA: Diagnosis not present

## 2022-07-15 DIAGNOSIS — E559 Vitamin D deficiency, unspecified: Secondary | ICD-10-CM

## 2022-07-15 MED ORDER — VITAMIN D (ERGOCALCIFEROL) 1.25 MG (50000 UNIT) PO CAPS
50000.0000 [IU] | ORAL_CAPSULE | ORAL | 0 refills | Status: DC
Start: 2022-07-15 — End: 2022-09-04
  Filled 2022-07-15 – 2022-07-16 (×2): qty 4, 28d supply, fill #0

## 2022-07-16 ENCOUNTER — Other Ambulatory Visit (HOSPITAL_COMMUNITY): Payer: Self-pay

## 2022-07-20 DIAGNOSIS — E1159 Type 2 diabetes mellitus with other circulatory complications: Secondary | ICD-10-CM | POA: Insufficient documentation

## 2022-07-20 NOTE — Progress Notes (Signed)
Chief Complaint:   OBESITY Ashley Clark is here to discuss her progress with her obesity treatment plan along with follow-up of her obesity related diagnoses. Ashley Clark is on the Category 1 Plan and states she is following her eating plan approximately 80% of the time. Ashley Clark states she is not exercising.  Today's visit was #: 3 Starting weight: 219 lbs Starting date: 06/10/2022 Today's weight: 207 lbs Today's date: 07/15/2022 Total lbs lost to date: 12 lbs Total lbs lost since last in-office visit: 6 lbs  Interim History: Ashley Clark increased her protein intake and has been eating yogurt and yasso as a snack.  She denies any hunger or cravings.  She follows the plan closely Monday through Friday.  Saturday and Sunday, she eats what she wants within reason.  Subjective:   1. Type 2 diabetes mellitus with other specified complication, without long-term current use of insulin (HCC) Medications reviewed. Diabetic ROS: no polyuria or polydipsia, no chest pain, dyspnea or TIA's, no numbness, tingling or pain in extremities She has not been checking her blood sugars.  She denies any hunger or cravings.  She denies any symptoms or concerns.  2. Hypertension associated with type 2 diabetes mellitus ([place]) Kennisha's PCP started losartan.  Ashley Clark is tolerating medication(s) well without side effects.  Medication compliance is good as patient endorses taking it as prescribed.  The patient denies additional concerns regarding this condition.      3. Vitamin D deficiency She is currently taking prescription vitamin D 50,000 IU each week. She denies any side effects, nausea, vomiting or muscle weakness.  Started last office visit and tolerating it well.    Assessment/Plan:  No orders of the defined types were placed in this encounter.   Medications Discontinued During This Encounter  Medication Reason   Vitamin D, Ergocalciferol, (DRISDOL) 1.25 MG (50000 UNIT) CAPS capsule Reorder     Meds  ordered this encounter  Medications   Vitamin D, Ergocalciferol, (DRISDOL) 1.25 MG (50000 UNIT) CAPS capsule    Sig: Take 1 capsule (50,000 Units total) by mouth every 7 (seven) days.    Dispense:  4 capsule    Refill:  0     1. Type 2 diabetes mellitus with other specified complication, without long-term current use of insulin ([place]) Ashley Clark declines need for any medications at this time and wants to avoid them at all costs.  Continue prudent nutritional plan and weight loss.   2. Hypertension associated with type 2 diabetes mellitus ([place]) [name]'s blood pressure is much better and at goal now.  Continue medication per PCP.  3. Vitamin D deficiency Low Vitamin D level contributes to fatigue and are associated with obesity, breast, and colon cancer. She agrees to continue to take prescription Vitamin D '@50'$ ,000 IU every week and will follow-up for routine testing of Vitamin D, at least 2-3 times per year to avoid over-replacement.  Refill - Vitamin D, Ergocalciferol, (DRISDOL) 1.25 MG (50000 UNIT) CAPS capsule; Take 1 capsule (50,000 Units total) by mouth every 7 (seven) days.  Dispense: 4 capsule; Refill: 0  4. Obesity, current BMI 36.7 Start walking 15-20 minutes 3 days per week.    [name] is currently in the action stage of change. As such, her goal is to continue with weight loss efforts. She has agreed to the Category 1 Plan.   Exercise goals: All adults should avoid inactivity. Some physical activity is better than none, and adults who participate in any amount of physical activity gain  some health benefits.  Okay to start walking 15-20 minutes at least 3 days per week.   Behavioral modification strategies: increasing lean protein intake and decreasing simple carbohydrates.  Ashley Clark has agreed to follow-up with our clinic in 3 weeks. She was informed of the importance of frequent follow-up visits to maximize her success with intensive lifestyle modifications for her multiple health  conditions.   Objective:   Blood pressure 115/78, pulse 62, temperature 98.7 F (37.1 C), height '5\' 3"'$  (1.6 m), weight 207 lb (93.9 kg), last menstrual period 03/12/2015, SpO2 97 %. Body mass index is 36.67 kg/m.  General: Cooperative, alert, well developed, in no acute distress. HEENT: Conjunctivae and lids unremarkable. Cardiovascular: Regular rhythm.  Lungs: Normal work of breathing. Neurologic: No focal deficits.   Lab Results  Component Value Date   CREATININE 0.69 06/10/2022   BUN 7 (L) 06/10/2022   NA 136 06/10/2022   K 4.6 06/10/2022   CL 97 06/10/2022   CO2 23 06/10/2022   Lab Results  Component Value Date   ALT 20 06/10/2022   AST 20 06/10/2022   ALKPHOS 151 (H) 06/10/2022   BILITOT 0.3 06/10/2022   Lab Results  Component Value Date   HGBA1C 6.8 (H) 06/10/2022   Lab Results  Component Value Date   INSULIN 45.1 (H) 06/10/2022   No results found for: "TSH" Lab Results  Component Value Date   CHOL 209 (H) 06/10/2022   HDL 65 06/10/2022   LDLCALC 132 (H) 06/10/2022   TRIG 67 06/10/2022   CHOLHDL 3.2 06/10/2022   Lab Results  Component Value Date   VD25OH 10.9 (L) 06/10/2022   Lab Results  Component Value Date   WBC 5.0 09/27/2018   HGB 13.7 09/27/2018   HCT 43.6 09/27/2018   MCV 85.3 09/27/2018   PLT 268 09/27/2018   No results found for: "IRON", "TIBC", "FERRITIN"  Attestation Statements:   Reviewed by clinician on day of visit: allergies, medications, problem list, medical history, surgical history, family history, social history, and previous encounter notes.  I, Davy Pique, RMA, am acting as Location manager for [employer], DO.   I have reviewed the above documentation for accuracy and completeness, and I agree with the above. Marjory Sneddon, D.O.  The [place] was signed into law in 2016 which includes the topic of electronic health records.  This provides immediate access to information in MyChart.  This  includes consultation notes, operative notes, office notes, lab results and pathology reports.  If you have any questions about what you read please let us know at your next visit so we can discuss your concerns and take corrective action if need be.  We are right here with you.

## 2022-08-06 ENCOUNTER — Encounter (INDEPENDENT_AMBULATORY_CARE_PROVIDER_SITE_OTHER): Payer: Self-pay

## 2022-08-06 ENCOUNTER — Ambulatory Visit (INDEPENDENT_AMBULATORY_CARE_PROVIDER_SITE_OTHER): Payer: 59 | Admitting: Family Medicine

## 2022-08-06 ENCOUNTER — Encounter (INDEPENDENT_AMBULATORY_CARE_PROVIDER_SITE_OTHER): Payer: Self-pay | Admitting: Family Medicine

## 2022-08-06 VITALS — BP 111/75 | HR 72 | Temp 98.1°F | Ht 63.0 in | Wt 202.0 lb

## 2022-08-06 DIAGNOSIS — E669 Obesity, unspecified: Secondary | ICD-10-CM | POA: Diagnosis not present

## 2022-08-06 DIAGNOSIS — E1169 Type 2 diabetes mellitus with other specified complication: Secondary | ICD-10-CM | POA: Diagnosis not present

## 2022-08-06 DIAGNOSIS — Z6835 Body mass index (BMI) 35.0-35.9, adult: Secondary | ICD-10-CM | POA: Diagnosis not present

## 2022-08-06 DIAGNOSIS — E559 Vitamin D deficiency, unspecified: Secondary | ICD-10-CM

## 2022-08-15 NOTE — Progress Notes (Signed)
Chief Complaint:   OBESITY Ashley Clark is here to discuss her progress with her obesity treatment plan along with follow-up of her obesity related diagnoses. Ashley Clark is on the Category 1 Plan and states she is following her eating plan approximately 80% of the time. Ashley Clark states she is walking 20+ minutes 3 times per week.  Today's visit was #: 4 Starting weight: 219 lbs Starting date: 06/10/2022 Today's weight: 202 lbs Today's date: 08/06/2022 Total lbs lost to date: 17 Total lbs lost since last in-office visit: 5  Interim History: Ashley Clark is here for a follow up office visit.  We reviewed her meal plan and all questions were answered.  Patient's food recall appears to be accurate and consistent with what is on plan when she is following it.   When eating on plan, her hunger and cravings are well controlled.   Pt notes that she got into a really good rhythm with eating on plan Monday through Friday at work. On weekends, she states, "I take a break from the diet." Ashley Clark is doing great with her weight loss. She has no issues or concerns.  Subjective:   1. Type 2 diabetes mellitus with other specified complication, without long-term current use of insulin (HCC) Pt denies hunger or cravings. She is not checking her blood sugar. Ashley Clark feels great and has no concerns. She is wondering if she needs meds. Medication: None  Assessment/Plan:  No orders of the defined types were placed in this encounter.   There are no discontinued medications.   No orders of the defined types were placed in this encounter.    1. Type 2 diabetes mellitus with other specified complication, without long-term current use of insulin (HCC) Good blood sugar control is important to decrease the likelihood of diabetic complications such as nephropathy, neuropathy, limb loss, blindness, coronary artery disease, and death. Intensive lifestyle modification including diet, exercise and weight loss are the first  line of treatment for diabetes.  Discussed with pt her last A1c was 6.8. She prefers no medications, if they are not needed. Risks and benefits of meds discussed with pt, but she declines meds at this time.  2. Obesity, current BMI 35.9 Ashley Clark is currently in the action stage of change. As such, her goal is to continue with weight loss efforts. She has agreed to the Category 1 Plan.   Exercise goals:  As is  Behavioral modification strategies: increasing lean protein intake, decreasing simple carbohydrates, and planning for success.  Ashley Clark has agreed to follow-up with our clinic in 4 weeks. She was informed of the importance of frequent follow-up visits to maximize her success with intensive lifestyle modifications for her multiple health conditions.   Objective:   Blood pressure 111/75, pulse 72, temperature 98.1 F (36.7 C), height '5\' 3"'$  (1.6 m), weight 202 lb (91.6 kg), last menstrual period 03/12/2015, SpO2 97 %. Body mass index is 35.78 kg/m.  General: Cooperative, alert, well developed, in no acute distress. HEENT: Conjunctivae and lids unremarkable. Cardiovascular: Regular rhythm.  Lungs: Normal work of breathing. Neurologic: No focal deficits.   Lab Results  Component Value Date   CREATININE 0.69 06/10/2022   BUN 7 (L) 06/10/2022   NA 136 06/10/2022   K 4.6 06/10/2022   CL 97 06/10/2022   CO2 23 06/10/2022   Lab Results  Component Value Date   ALT 20 06/10/2022   AST 20 06/10/2022   ALKPHOS 151 (H) 06/10/2022   BILITOT 0.3 06/10/2022  Lab Results  Component Value Date   HGBA1C 6.8 (H) 06/10/2022   Lab Results  Component Value Date   INSULIN 45.1 (H) 06/10/2022   No results found for: "TSH" Lab Results  Component Value Date   CHOL 209 (H) 06/10/2022   HDL 65 06/10/2022   LDLCALC 132 (H) 06/10/2022   TRIG 67 06/10/2022   CHOLHDL 3.2 06/10/2022   Lab Results  Component Value Date   VD25OH 10.9 (L) 06/10/2022   Lab Results  Component Value Date    WBC 5.0 09/27/2018   HGB 13.7 09/27/2018   HCT 43.6 09/27/2018   MCV 85.3 09/27/2018   PLT 268 09/27/2018    Attestation Statements:   Reviewed by clinician on day of visit: allergies, medications, problem list, medical history, surgical history, family history, social history, and previous encounter notes.  Time spent on visit including pre-visit chart review and post-visit care and charting was 20 minutes.   I, Kathlene November, BS, CMA, am acting as transcriptionist for [employer], DO.  I have reviewed the above documentation for accuracy and completeness, and I agree with the above. Marjory Sneddon, D.O.  The [place] was signed into law in 2016 which includes the topic of electronic health records.  This provides immediate access to information in MyChart.  This includes consultation notes, operative notes, office notes, lab results and pathology reports.  If you have any questions about what you read please let us know at your next visit so we can discuss your concerns and take corrective action if need be.  We are right here with you.

## 2022-09-04 ENCOUNTER — Ambulatory Visit (INDEPENDENT_AMBULATORY_CARE_PROVIDER_SITE_OTHER): Payer: 59 | Admitting: Family Medicine

## 2022-09-04 ENCOUNTER — Encounter (INDEPENDENT_AMBULATORY_CARE_PROVIDER_SITE_OTHER): Payer: Self-pay | Admitting: Family Medicine

## 2022-09-04 ENCOUNTER — Other Ambulatory Visit (HOSPITAL_COMMUNITY): Payer: Self-pay

## 2022-09-04 VITALS — BP 125/78 | HR 77 | Temp 98.5°F | Ht 63.0 in | Wt 199.0 lb

## 2022-09-04 DIAGNOSIS — Z6835 Body mass index (BMI) 35.0-35.9, adult: Secondary | ICD-10-CM | POA: Diagnosis not present

## 2022-09-04 DIAGNOSIS — E669 Obesity, unspecified: Secondary | ICD-10-CM

## 2022-09-04 DIAGNOSIS — Z7984 Long term (current) use of oral hypoglycemic drugs: Secondary | ICD-10-CM

## 2022-09-04 DIAGNOSIS — E1169 Type 2 diabetes mellitus with other specified complication: Secondary | ICD-10-CM | POA: Diagnosis not present

## 2022-09-04 DIAGNOSIS — E559 Vitamin D deficiency, unspecified: Secondary | ICD-10-CM

## 2022-09-04 DIAGNOSIS — Z9189 Other specified personal risk factors, not elsewhere classified: Secondary | ICD-10-CM | POA: Insufficient documentation

## 2022-09-04 MED ORDER — VITAMIN D (ERGOCALCIFEROL) 1.25 MG (50000 UNIT) PO CAPS
50000.0000 [IU] | ORAL_CAPSULE | ORAL | 0 refills | Status: DC
  Filled 2022-09-04: qty 4, 28d supply, fill #0

## 2022-09-04 MED ORDER — RYBELSUS 3 MG PO TABS
1.0000 | ORAL_TABLET | Freq: Every day | ORAL | 0 refills | Status: DC
  Filled 2022-09-04 – 2022-09-11 (×2): qty 30, 30d supply, fill #0

## 2022-09-08 ENCOUNTER — Telehealth (INDEPENDENT_AMBULATORY_CARE_PROVIDER_SITE_OTHER): Payer: Self-pay | Admitting: Family Medicine

## 2022-09-08 ENCOUNTER — Encounter (INDEPENDENT_AMBULATORY_CARE_PROVIDER_SITE_OTHER): Payer: Self-pay

## 2022-09-08 NOTE — Telephone Encounter (Signed)
Dr. Raliegh Scarlet - Prior authorization denied for Rybelsus. Per insurance: When used for the treatment of type 2 diabetes mellitus, our guideline named GLP-1 STEP OVERRIDE (reviewed for Rybelsus) requires you to try metformin, a metformin combination product, a covered sulfonylurea (such as glimepiride, glipizide, or glyburide), a sulfonylurea combination product, pioglitazone OR a pioglitazone combination product. Patient sent denial message via mychart.

## 2022-09-11 ENCOUNTER — Other Ambulatory Visit (HOSPITAL_COMMUNITY): Payer: Self-pay

## 2022-09-11 NOTE — Progress Notes (Unsigned)
Chief Complaint:   OBESITY Ashley Clark is here to discuss her progress with her obesity treatment plan along with follow-up of her obesity related diagnoses. Ashley Clark is on the Category 1 Plan and states she is following her eating plan approximately 80% of the time. Ashley Clark states she is walking 20 minutes 2 times per week.  Today's visit was #: 5 Starting weight: 219 lbs Starting date: 06/10/2022 Today's weight: 199 lbs Today's date: 09/04/2022 Total lbs lost to date: 20 Total lbs lost since last in-office visit: 3  Interim History: Ashley Clark has been going out to eat a lot and having barbeques, so she is very happy she lost weight today and is surprised. She did more carbs/caloric rich foods than usual. Pt gained fat mass and lost muscle mass.  Subjective:   1. Type 2 diabetes mellitus with other specified complication, without long-term current use of insulin (HCC) Ashley Clark is not checking her blood sugar. Her last A1c was not at goal at 6.8.  2. Vitamin D deficiency She is currently taking prescription vitamin D 50,000 IU each week. She denies nausea, vomiting or muscle weakness.  3. At risk for side effect of medication Ashley Clark is at risk for side effects of medication due to starting Rybelsus.  Assessment/Plan:  No orders of the defined types were placed in this encounter.   Medications Discontinued During This Encounter  Medication Reason   Vitamin D, Ergocalciferol, (DRISDOL) 1.25 MG (50000 UNIT) CAPS capsule Reorder     Meds ordered this encounter  Medications   Vitamin D, Ergocalciferol, (DRISDOL) 1.25 MG (50000 UNIT) CAPS capsule    Sig: Take 1 capsule (50,000 Units total) by mouth every 7 (seven) days.    Dispense:  4 capsule    Refill:  0   Semaglutide (RYBELSUS) 3 MG TABS    Sig: Take 1 tablet by mouth daily.    Dispense:  30 tablet    Refill:  0     1. Type 2 diabetes mellitus with other specified complication, without long-term current use of insulin (HCC) Ashley Clark  blood sugar control is important to decrease the likelihood of diabetic complications such as nephropathy, neuropathy, limb loss, blindness, coronary artery disease, and death. Intensive lifestyle modification including diet, exercise and weight loss are the first line of treatment for diabetes.  Start Rybelsus 3 mg, after risks and benefits discussed with pt. No contraindications found upon interviewing pt.  Start- Semaglutide (RYBELSUS) 3 MG TABS; Take 1 tablet by mouth daily.  Dispense: 30 tablet; Refill: 0  2. Vitamin D deficiency Low Vitamin D level contributes to fatigue and are associated with obesity, breast, and colon cancer. She agrees to continue to take prescription Vitamin D '@50'$ ,000 IU every week and will follow-up for routine testing of Vitamin D, at least 2-3 times per year to avoid over-replacement.  Refill- Vitamin D, Ergocalciferol, (DRISDOL) 1.25 MG (50000 UNIT) CAPS capsule; Take 1 capsule (50,000 Units total) by mouth every 7 (seven) days.  Dispense: 4 capsule; Refill: 0  3. At risk for side effect of medication Due to Ashley Clark's current conditions and medications, she is at a higher risk for drug side effect.  At least 10 minutes was spent on counseling her about these concerns today.  We discussed the benefits and potential risks of these medications, and all of patient's concerns were addressed and questions were answered.  she will call us, or their PCP or other specialists who treat their conditions with medications, with any questions or  concerns that may develop.    4. Obesity,Current BMI 35.4 Ashley Clark is currently in the action stage of change. As such, her goal is to continue with weight loss efforts. She has agreed to the Category 1 Plan.   Exercise goals:  As is  Behavioral modification strategies: decreasing simple carbohydrates and planning for success.  Ashley Clark has agreed to follow-up with our clinic in 3-4 weeks. She was informed of the importance of frequent  follow-up visits to maximize her success with intensive lifestyle modifications for her multiple health conditions.   Objective:   Blood pressure 125/78, pulse 77, temperature 98.5 F (36.9 C), height '5\' 3"'$  (1.6 m), weight 199 lb (90.3 kg), last menstrual period 03/12/2015, SpO2 93 %. Body mass index is 35.25 kg/m.  General: Cooperative, alert, well developed, in no acute distress. HEENT: Conjunctivae and lids unremarkable. Cardiovascular: Regular rhythm.  Lungs: Normal work of breathing. Neurologic: No focal deficits.   Lab Results  Component Value Date   CREATININE 0.69 06/10/2022   BUN 7 (L) 06/10/2022   NA 136 06/10/2022   K 4.6 06/10/2022   CL 97 06/10/2022   CO2 23 06/10/2022   Lab Results  Component Value Date   ALT 20 06/10/2022   AST 20 06/10/2022   ALKPHOS 151 (H) 06/10/2022   BILITOT 0.3 06/10/2022   Lab Results  Component Value Date   HGBA1C 6.8 (H) 06/10/2022   Lab Results  Component Value Date   INSULIN 45.1 (H) 06/10/2022   No results found for: "TSH" Lab Results  Component Value Date   CHOL 209 (H) 06/10/2022   HDL 65 06/10/2022   LDLCALC 132 (H) 06/10/2022   TRIG 67 06/10/2022   CHOLHDL 3.2 06/10/2022   Lab Results  Component Value Date   VD25OH 10.9 (L) 06/10/2022   Lab Results  Component Value Date   WBC 5.0 09/27/2018   HGB 13.7 09/27/2018   HCT 43.6 09/27/2018   MCV 85.3 09/27/2018   PLT 268 09/27/2018    Attestation Statements:   Reviewed by clinician on day of visit: allergies, medications, problem list, medical history, surgical history, family history, social history, and previous encounter notes.  I, Kathlene November, BS, CMA, am acting as transcriptionist for [employer], DO.   I have reviewed the above documentation for accuracy and completeness, and I agree with the above. Marjory Sneddon, D.O.  The [place] was signed into law in 2016 which includes the topic of electronic health records.  This  provides immediate access to information in MyChart.  This includes consultation notes, operative notes, office notes, lab results and pathology reports.  If you have any questions about what you read please let us know at your next visit so we can discuss your concerns and take corrective action if need be.  We are right here with you.

## 2022-10-02 ENCOUNTER — Ambulatory Visit (INDEPENDENT_AMBULATORY_CARE_PROVIDER_SITE_OTHER): Payer: 59 | Admitting: Family Medicine

## 2022-10-03 ENCOUNTER — Other Ambulatory Visit (HOSPITAL_COMMUNITY): Payer: Self-pay

## 2022-10-03 MED ORDER — INFLUENZA VAC SPLIT QUAD 0.5 ML IM SUSY
0.5000 mL | PREFILLED_SYRINGE | INTRAMUSCULAR | 0 refills | Status: DC
  Filled 2022-10-03: qty 0.5, 1d supply, fill #0

## 2022-10-08 ENCOUNTER — Other Ambulatory Visit (HOSPITAL_COMMUNITY): Payer: Self-pay

## 2022-10-08 ENCOUNTER — Other Ambulatory Visit: Payer: Self-pay | Admitting: Hematology and Oncology

## 2022-10-08 MED ORDER — LETROZOLE 2.5 MG PO TABS
2.5000 mg | ORAL_TABLET | Freq: Every day | ORAL | 0 refills | Status: DC
  Filled 2022-10-08: qty 90, 90d supply, fill #0

## 2022-10-23 ENCOUNTER — Telehealth: Payer: Self-pay | Admitting: Hematology and Oncology

## 2022-10-23 NOTE — Telephone Encounter (Signed)
Scheduled appointment per 10/11 staff message. Patient is aware.

## 2022-10-24 DIAGNOSIS — H47323 Drusen of optic disc, bilateral: Secondary | ICD-10-CM | POA: Diagnosis not present

## 2022-10-24 DIAGNOSIS — E119 Type 2 diabetes mellitus without complications: Secondary | ICD-10-CM | POA: Diagnosis not present

## 2022-10-24 DIAGNOSIS — H35342 Macular cyst, hole, or pseudohole, left eye: Secondary | ICD-10-CM | POA: Diagnosis not present

## 2022-10-28 DIAGNOSIS — E7800 Pure hypercholesterolemia, unspecified: Secondary | ICD-10-CM | POA: Diagnosis not present

## 2022-10-28 DIAGNOSIS — E1169 Type 2 diabetes mellitus with other specified complication: Secondary | ICD-10-CM | POA: Diagnosis not present

## 2022-10-28 DIAGNOSIS — Z23 Encounter for immunization: Secondary | ICD-10-CM | POA: Diagnosis not present

## 2022-11-29 NOTE — Progress Notes (Signed)
Patient Care Team: Lennie Odor, [place] as PCP - General (Nurse Practitioner) Nicholas Lose, MD as Consulting Physician (Hematology and Oncology) Kyung Rudd, MD as Consulting Physician (Radiation Oncology) Erroll Luna, MD as Consulting Physician (General Surgery)  DIAGNOSIS: No diagnosis found.  SUMMARY OF ONCOLOGIC HISTORY: Oncology History  Malignant neoplasm of overlapping sites of left breast in female, estrogen receptor positive ([place])  08/04/2018 Initial Diagnosis   Screening mammogram detected left breast mass at 9:30 position 1.2 x 1.2 x 1.1 cm.  Closer to the nipple at 9:30 position 0.8 cm lesion span 3.6 cm and a 1.5 cm apart.  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 position 2 cm from nipple, at 1 cm from nipple PASH, lymph node benign, ER 90%, PR 95%, Ki-67 20%, HER-2 negative by IHC 1+, T1CN0 stage Ia AJCC 8   09/29/2018 Surgery   Left lumpectomy: Grade 3 IDC, 1.7 cm, margins negative, lymphovascular invasion present, 0/2 lymph nodes negative, T1c N0 stage Ia   10/06/2018 Cancer Staging   Staging form: Breast, AJCC 8th Edition - Pathologic: Stage IA (pT1c, pN0(sn), cM0, G3, ER+, PR+, HER2-) - Signed by Nicholas Lose, MD on 10/06/2018   10/22/2018 Oncotype testing   Oncotype score 13: Distant recurrence at 9 years with hormone therapy alone 4%   11/12/2018 - 12/27/2018 Radiation Therapy   Adjuvant radiation therapy   12/27/2018 -  Anti-estrogen oral therapy   Antiestrogen therapy with letrozole 2.5 mg daily     CHIEF COMPLIANT: Follow-up of left breast cancer on letrozole   INTERVAL HISTORY: Ashley Clark is a 61 y.o. with above-mentioned history of left breast cancer who underwent lumpectomy, radiation, and is currently on anti-estrogen therapy with letrozole. Mammogram on 01/30/2021 showed no evidence of malignancy bilaterally. She presents to the clinic today for follow-up.     ALLERGIES:  has No Known Allergies.  MEDICATIONS:  Current Outpatient Medications  Medication Sig Dispense Refill   Ascorbic Acid (VITAMIN C) 100 MG tablet Take 100 mg by mouth daily.     calcium citrate-vitamin D (CITRACAL+D) 315-200 MG-UNIT tablet Take 1 tablet by mouth 2 (two) times daily.     cetirizine (ZYRTEC) 10 MG chewable tablet Chew 10 mg by mouth daily.     COVID-19 mRNA bivalent vaccine, Pfizer, (PFIZER COVID-19 VAC BIVALENT) injection Inject into the muscle. 0.3 mL 0   Fluocinolone Acetonide Scalp 0.01 % OIL Apply topically to scalp twice daily as needed for flares. 118.28 mL 2   fluticasone (CUTIVATE) 0.05 % cream Apply topically to affected areas on face twice daily for 1 week then off a week as needed. 60 g 2   ibuprofen (ADVIL,MOTRIN) 800 MG tablet Take 1 tablet (800 mg total) by mouth every 8 (eight) hours as needed. 30 tablet 0   influenza vac split quadrivalent PF (FLUARIX) 0.5 ML injection Inject 0.5 mLs into the muscle. 0.5 mL 0   ketoconazole (NIZORAL) 2 % cream Apply 1 application topically to affected areas on face 2 (two) times daily for 3 weeks then as needed. 60 g 2   ketoconazole (NIZORAL) 2 % shampoo Wash scalp and leave in 3 to 20mnutes. Wash scalp as normal. 120 mL 2   letrozole (FEMARA) 2.5 MG tablet Take 1 tablet (2.5 mg total) by mouth daily. 90 tablet 0   losartan (COZAAR) 25 MG tablet Take 1 tablet (25 mg total) by mouth daily for kidney  protection. 90 tablet 3   Semaglutide (RYBELSUS) 3 MG TABS Take 1 tablet by mouth daily. 30 tablet 0   Vitamin D, Ergocalciferol, (DRISDOL) 1.25 MG (50000 UNIT) CAPS capsule Take 1 capsule (50,000 Units total) by mouth every 7 (seven) days. 4 capsule 0   No current facility-administered medications for this visit.    PHYSICAL EXAMINATION: ECOG PERFORMANCE STATUS: {CHL ONC ECOG PS:[id number]}  There were no vitals filed for this visit. There were no vitals filed for this visit.  BREAST:*** No palpable masses or  nodules in either right or left breasts. No palpable axillary supraclavicular or infraclavicular adenopathy no breast tenderness or nipple discharge. (exam performed in the presence of a chaperone)  LABORATORY DATA:  I have reviewed the data as listed    Latest Ref Rng & Units 06/10/2022   11:52 AM  CMP  Glucose 70 - 99 mg/dL 132   BUN 8 - 27 mg/dL 7   Creatinine 0.57 - 1.00 mg/dL 0.69   Sodium 134 - 144 mmol/L 136   Potassium 3.5 - 5.2 mmol/L 4.6   Chloride 96 - 106 mmol/L 97   CO2 20 - 29 mmol/L 23   Calcium 8.7 - 10.3 mg/dL 9.7   Total Protein 6.0 - 8.5 g/dL 7.6   Total Bilirubin 0.0 - 1.2 mg/dL 0.3   Alkaline Phos 44 - 121 IU/L 151   AST 0 - 40 IU/L 20   ALT 0 - 32 IU/L 20     Lab Results  Component Value Date   WBC 5.0 09/27/2018   HGB 13.7 09/27/2018   HCT 43.6 09/27/2018   MCV 85.3 09/27/2018   PLT 268 09/27/2018   NEUTROABS 2.6 09/27/2018    ASSESSMENT & PLAN:  No problem-specific Assessment & Plan notes found for this encounter.    No orders of the defined types were placed in this encounter.  The patient has a good understanding of the overall plan. she agrees with it. she will call with any problems that may develop before the next visit here. Total time spent: 30 mins including face to face time and time spent for planning, charting and co-ordination of care   Suzzette Righter, [place] 11/29/22    I Gardiner Coins am scribing for Dr. Lindi Adie  ***

## 2022-12-02 ENCOUNTER — Inpatient Hospital Stay: Payer: 59 | Attending: Hematology and Oncology | Admitting: Hematology and Oncology

## 2022-12-02 ENCOUNTER — Other Ambulatory Visit (HOSPITAL_COMMUNITY): Payer: Self-pay

## 2022-12-02 VITALS — BP 131/72 | HR 71 | Temp 97.3°F | Resp 18 | Ht 63.0 in | Wt 194.3 lb

## 2022-12-02 DIAGNOSIS — Z79899 Other long term (current) drug therapy: Secondary | ICD-10-CM | POA: Diagnosis not present

## 2022-12-02 DIAGNOSIS — C50812 Malignant neoplasm of overlapping sites of left female breast: Secondary | ICD-10-CM | POA: Insufficient documentation

## 2022-12-02 DIAGNOSIS — Z170 Estrogen receptor positive status [ER+]: Secondary | ICD-10-CM | POA: Insufficient documentation

## 2022-12-02 DIAGNOSIS — Z79811 Long term (current) use of aromatase inhibitors: Secondary | ICD-10-CM | POA: Insufficient documentation

## 2022-12-02 DIAGNOSIS — Z923 Personal history of irradiation: Secondary | ICD-10-CM | POA: Insufficient documentation

## 2022-12-02 MED ORDER — LETROZOLE 2.5 MG PO TABS
2.5000 mg | ORAL_TABLET | Freq: Every day | ORAL | 4 refills | Status: DC
  Filled 2022-12-02 – 2023-03-04 (×2): qty 90, 90d supply, fill #0
  Filled 2023-06-10: qty 90, 90d supply, fill #1

## 2022-12-02 NOTE — Assessment & Plan Note (Signed)
09/29/2018 left lumpectomy: Grade 3 IDC, 1.7 cm, margins negative, lymphovascular invasion present, 0/2 lymph nodes negative, T1c N0 stage Ia, ER 90%, PR 95%, HER-2 negative, Ki-67 20% Oncotype DX score 13: Risk of recurrence at 9 years: 4%   Current treatment:  1. Adjuvant radiation therapy 11/12/2018-12/27/2018 2.  Adjuvant antiestrogen therapy: Letrozole 2.5 mg daily started 12/27/2018   Letrozole toxicities:  Moderate to severe hot flashes: Patient is managing it. Profound weight gain: Patient tells me that she does not eat carbs but still she gains weight.  I will refer her to the weight loss clinic.   Breast cancer surveillance:  Mammograms 01/31/2022: Benign breast density category C. 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      Return to clinic in 1 year for follow-up

## 2022-12-03 ENCOUNTER — Telehealth: Payer: Self-pay | Admitting: Hematology and Oncology

## 2022-12-03 NOTE — Telephone Encounter (Signed)
Scheduled appointment per 1/25 los. Patient is aware. 

## 2022-12-10 ENCOUNTER — Other Ambulatory Visit (HOSPITAL_COMMUNITY): Payer: Self-pay

## 2023-02-06 ENCOUNTER — Other Ambulatory Visit (HOSPITAL_COMMUNITY): Payer: Self-pay

## 2023-02-06 DIAGNOSIS — E1169 Type 2 diabetes mellitus with other specified complication: Secondary | ICD-10-CM | POA: Diagnosis not present

## 2023-02-06 DIAGNOSIS — E7800 Pure hypercholesterolemia, unspecified: Secondary | ICD-10-CM | POA: Diagnosis not present

## 2023-02-06 MED ORDER — LOSARTAN POTASSIUM 25 MG PO TABS
25.0000 mg | ORAL_TABLET | Freq: Every day | ORAL | 3 refills | Status: DC
  Filled 2023-02-06 – 2023-03-04 (×2): qty 90, 90d supply, fill #0

## 2023-02-16 ENCOUNTER — Other Ambulatory Visit (HOSPITAL_COMMUNITY): Payer: Self-pay

## 2023-02-16 ENCOUNTER — Other Ambulatory Visit (HOSPITAL_BASED_OUTPATIENT_CLINIC_OR_DEPARTMENT_OTHER): Payer: Self-pay

## 2023-03-04 ENCOUNTER — Other Ambulatory Visit (HOSPITAL_COMMUNITY): Payer: Self-pay

## 2023-03-04 ENCOUNTER — Other Ambulatory Visit: Payer: Self-pay

## 2023-03-30 ENCOUNTER — Ambulatory Visit (HOSPITAL_BASED_OUTPATIENT_CLINIC_OR_DEPARTMENT_OTHER): Payer: 59 | Admitting: Student

## 2023-03-30 ENCOUNTER — Encounter (HOSPITAL_BASED_OUTPATIENT_CLINIC_OR_DEPARTMENT_OTHER): Payer: Self-pay | Admitting: Student

## 2023-03-30 ENCOUNTER — Ambulatory Visit (INDEPENDENT_AMBULATORY_CARE_PROVIDER_SITE_OTHER): Payer: 59

## 2023-03-30 DIAGNOSIS — M25561 Pain in right knee: Secondary | ICD-10-CM

## 2023-03-30 MED ORDER — TRIAMCINOLONE ACETONIDE 40 MG/ML IJ SUSP
2.0000 mL | INTRAMUSCULAR | Status: AC | PRN
  Administered 2023-03-30: 2 mL via INTRA_ARTICULAR

## 2023-03-30 MED ORDER — LIDOCAINE HCL 1 % IJ SOLN
4.0000 mL | INTRAMUSCULAR | Status: AC | PRN
  Administered 2023-03-30: 4 mL

## 2023-03-30 NOTE — Progress Notes (Signed)
Chief Complaint: Right knee pain     History of Present Illness:    Ashley Clark is a 62 y.o. female presenting for evaluation of atraumatic right knee pain.  She states that this began about 6 weeks ago and has been occurring intermittently.  She describes the pain as an ache throughout her entire knee as well as some tightness behind the knee.  She does feel like she has had some swelling.  Standing, walking, and sitting for long periods of time tend to cause more discomfort.  She has no history of gout.  She has not been taking any medications or performing any other therapies.   Surgical History:   None  PMH/PSH/Family History/Social History/Meds/Allergies:    Past Medical History:  Diagnosis Date   Back pain    Cancer 2019   left breast cancer-last radiation Dec.2019   Medical history non-contributory    Personal history of radiation therapy    Prediabetes    Tiredness    Vitamin D deficiency    Past Surgical History:  Procedure Laterality Date   BREAST BIOPSY Left 08/04/2018   x3   BREAST LUMPECTOMY Left    09-29-18   BREAST LUMPECTOMY WITH RADIOACTIVE SEED AND SENTINEL LYMPH NODE BIOPSY Left 09/29/2018   Procedure: LEFT BREAST LUMPECTOMY WITH RADIOACTIVE SEED AND SENTINEL LYMPH NODE BIOPSY;  Surgeon: Erroll Luna, MD;  Location: [facility];  Service: General;  Laterality: Left;   COLONOSCOPY  2016   POLYPECTOMY     WISDOM TOOTH EXTRACTION     Social History   Socioeconomic History   Marital status: Married    Spouse name: Not on file   Number of children: Not on file   Years of education: Not on file   Highest education level: Not on file  Occupational History   Not on file  Tobacco Use   Smoking status: Former    Packs/day: .25    Types: Cigarettes    Quit date: 01/2015    Years since quitting: 8.1   Smokeless tobacco: Never   Tobacco comments:    3-4 cigs a day   Vaping Use   Vaping Use: Never  used  Substance and Sexual Activity   Alcohol use: Yes    Alcohol/week: 4.0 standard drinks of alcohol    Types: 4 Glasses of wine per week   Drug use: No   Sexual activity: Not on file  Other Topics Concern   Not on file  Social History Narrative   Not on file   Social Determinants of Health   Financial Resource Strain: Not on file  Food Insecurity: Not on file  Transportation Needs: No Transportation Needs (02/07/2019)   PRAPARE - Transportation    Lack of Transportation (Medical): No    Lack of Transportation (Non-Medical): No  Physical Activity: Not on file  Stress: Not on file  Social Connections: Not on file   Family History  Problem Relation Age of Onset   Breast cancer Mother 61   Colon polyps Mother    Colon cancer Neg Hx    Rectal cancer Neg Hx    Stomach cancer Neg Hx    Esophageal cancer Neg Hx    No Known Allergies Current Outpatient Medications  Medication Sig Dispense Refill   Ascorbic Acid (VITAMIN C)  100 MG tablet Take 100 mg by mouth daily.     calcium citrate-vitamin D (CITRACAL+D) 315-200 MG-UNIT tablet Take 1 tablet by mouth 2 (two) times daily.     cetirizine (ZYRTEC) 10 MG chewable tablet Chew 10 mg by mouth daily.     COVID-19 mRNA bivalent vaccine, Pfizer, (PFIZER COVID-19 VAC BIVALENT) injection Inject into the muscle. 0.3 mL 0   Fluocinolone Acetonide Scalp 0.01 % OIL Apply topically to scalp twice daily as needed for flares. 118.28 mL 2   fluticasone (CUTIVATE) 0.05 % cream Apply topically to affected areas on face twice daily for 1 week then off a week as needed. 60 g 2   ibuprofen (ADVIL,MOTRIN) 800 MG tablet Take 1 tablet (800 mg total) by mouth every 8 (eight) hours as needed. 30 tablet 0   influenza vac split quadrivalent PF (FLUARIX) 0.5 ML injection Inject 0.5 mLs into the muscle. 0.5 mL 0   ketoconazole (NIZORAL) 2 % cream Apply 1 application topically to affected areas on face 2 (two) times daily for 3 weeks then as needed. 60 g 2    ketoconazole (NIZORAL) 2 % shampoo Wash scalp and leave in 3 to 60minutes. Wash scalp as normal. 120 mL 2   letrozole (FEMARA) 2.5 MG tablet Take 1 tablet (2.5 mg total) by mouth daily. 90 tablet 4   losartan (COZAAR) 25 MG tablet Take 1 tablet (25 mg total) by mouth daily for kidney protection. 90 tablet 3   losartan (COZAAR) 25 MG tablet Take 1 tablet (25 mg total) by mouth daily for kidney protection 90 tablet 3   Vitamin D, Ergocalciferol, (DRISDOL) 1.25 MG (50000 UNIT) CAPS capsule Take 1 capsule (50,000 Units total) by mouth every 7 (seven) days. 4 capsule 0   No current facility-administered medications for this visit.   No results found.  Review of Systems:   A ROS was performed including pertinent positives and negatives as documented in the HPI.  Physical Exam :   Constitutional: NAD and appears stated age Neurological: Alert and oriented Psych: Appropriate affect and cooperative Last menstrual period 03/12/2015.   Comprehensive Musculoskeletal Exam:    Right knee is nontender to palpation.  Passive and active range of motion from 0-110 degrees.  Patella is mobile and nontender.  Mild to moderate joint effusion noted.  Knee joint is stable and without laxity with varus and valgus stress.  Negative Lachman.  Distal neurosensory exam intact.  Imaging:   Xray (right knee 2 views): No evidence of fracture or dislocation.  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   I personally reviewed and interpreted the radiographs.   Assessment:   62 y.o. female presenting for evaluation of right knee pain without injury.  Overall I suspect that fluid in her knee is likely contributing to her pain and tightness.  I recommend proceeding with knee joint aspiration and injection today which patient is agreeable.  The procedure was performed in clinic and patient tolerated it well.  32 cc of clear fluid was aspirated and knee was  injected with cortisone.  Will assess therapeutic benefit with injection and determine if further steps are necessary, such as an MRI to rule out a meniscal injury that is accelerating medial compartment arthritis.  Plan to have patient return as necessary.  Plan :    -Return to clinic as needed   I personally saw and evaluated the patient, and participated in the management and treatment plan.  Marnee Spring, PA-C Orthopedics      Procedure Note  Patient: Ashley Clark             Date of Birth: 17-Mar-1961           MRN: [id number]             Visit Date: 03/30/2023  Procedures: Visit Diagnoses:  1. Acute pain of right knee     Large Joint Inj: R knee on 03/30/2023 11:11 AM Indications: pain and joint swelling Details: 18 G 1.5 in needle, superolateral approach Medications: 2 mL triamcinolone acetonide 40 MG/ML; 4 mL lidocaine 1 % Aspirate: 32 mL clear Outcome: tolerated well, no immediate complications     This document was dictated using Systems analyst. A reasonable attempt at proof reading has been made to minimize errors.

## 2023-06-10 ENCOUNTER — Other Ambulatory Visit: Payer: Self-pay

## 2023-06-11 ENCOUNTER — Other Ambulatory Visit: Payer: Self-pay

## 2023-06-12 ENCOUNTER — Other Ambulatory Visit: Payer: Self-pay

## 2023-06-15 ENCOUNTER — Other Ambulatory Visit: Payer: Self-pay

## 2023-07-08 DIAGNOSIS — Z124 Encounter for screening for malignant neoplasm of cervix: Secondary | ICD-10-CM | POA: Diagnosis not present

## 2023-07-08 DIAGNOSIS — Z0000 Encounter for general adult medical examination without abnormal findings: Secondary | ICD-10-CM | POA: Diagnosis not present

## 2023-07-08 DIAGNOSIS — Z1211 Encounter for screening for malignant neoplasm of colon: Secondary | ICD-10-CM | POA: Diagnosis not present

## 2023-07-08 DIAGNOSIS — E1169 Type 2 diabetes mellitus with other specified complication: Secondary | ICD-10-CM | POA: Diagnosis not present

## 2023-07-08 DIAGNOSIS — Z853 Personal history of malignant neoplasm of breast: Secondary | ICD-10-CM | POA: Diagnosis not present

## 2023-07-08 DIAGNOSIS — E7800 Pure hypercholesterolemia, unspecified: Secondary | ICD-10-CM | POA: Diagnosis not present

## 2023-07-09 ENCOUNTER — Telehealth: Payer: Self-pay | Admitting: Gastroenterology

## 2023-07-09 ENCOUNTER — Other Ambulatory Visit: Payer: Self-pay | Admitting: Physician Assistant

## 2023-07-09 ENCOUNTER — Encounter: Payer: Self-pay | Admitting: Gastroenterology

## 2023-07-09 DIAGNOSIS — Z1231 Encounter for screening mammogram for malignant neoplasm of breast: Secondary | ICD-10-CM

## 2023-07-16 ENCOUNTER — Ambulatory Visit
Admission: RE | Admit: 2023-07-16 | Discharge: 2023-07-16 | Disposition: A | Discharge status: Home / Self Care | Payer: 59 | Source: Ambulatory Visit | Attending: Physician Assistant | Admitting: Physician Assistant

## 2023-07-16 DIAGNOSIS — Z1231 Encounter for screening mammogram for malignant neoplasm of breast: Secondary | ICD-10-CM

## 2023-07-22 ENCOUNTER — Other Ambulatory Visit: Payer: Self-pay

## 2023-07-22 ENCOUNTER — Ambulatory Visit (AMBULATORY_SURGERY_CENTER): Payer: 59

## 2023-07-22 ENCOUNTER — Other Ambulatory Visit (HOSPITAL_COMMUNITY): Payer: Self-pay

## 2023-07-22 VITALS — Ht 61.0 in | Wt 199.0 lb

## 2023-07-22 DIAGNOSIS — Z86010 Personal history of colonic polyps: Secondary | ICD-10-CM

## 2023-07-22 MED ORDER — NA SULFATE-K SULFATE-MG SULF 17.5-3.13-1.6 GM/177ML PO SOLN
1.0000 | Freq: Once | ORAL | 0 refills | Status: AC
Start: 2023-07-22 — End: 2023-08-05
  Filled 2023-07-22: qty 354, 1d supply, fill #0

## 2023-07-22 NOTE — Progress Notes (Signed)
No egg or soy allergy known to patient  No issues known to pt with past sedation with any surgeries or procedures Patient denies ever being told they had issues or difficulty with intubation  No FH of Malignant Hyperthermia Pt is not on diet pills Pt is not on  home 02  Pt is not on blood thinners  Pt denies issues with constipation  No A fib or A flutter Have any cardiac testing pending--no Pt can ambulate  Pt denies use of chewing tobacco Discussed diabetic and weight loss medication holds Discussed NSAID holds Checked BMI Pt instructed to use Singlecare.com or GoodRx for a price reduction on prep  Patient's chart reviewed by Cathlyn Parsons CNRA prior to previsit and patient appropriate for the [facility].  Pre visit completed and red dot placed by patient's name on their procedure day (on provider's schedule).

## 2023-08-05 ENCOUNTER — Encounter: Payer: Self-pay | Admitting: Gastroenterology

## 2023-08-11 ENCOUNTER — Other Ambulatory Visit (HOSPITAL_COMMUNITY): Payer: Self-pay

## 2023-08-11 ENCOUNTER — Other Ambulatory Visit: Payer: Self-pay

## 2023-08-11 MED ORDER — ATORVASTATIN CALCIUM 10 MG PO TABS
10.0000 mg | ORAL_TABLET | Freq: Every day | ORAL | 5 refills | Status: DC
  Filled 2023-08-11: qty 30, 30d supply, fill #0

## 2023-08-12 ENCOUNTER — Other Ambulatory Visit: Payer: Self-pay

## 2023-08-19 ENCOUNTER — Ambulatory Visit (AMBULATORY_SURGERY_CENTER): Payer: 59 | Admitting: Gastroenterology

## 2023-08-19 ENCOUNTER — Encounter: Payer: Self-pay | Admitting: Gastroenterology

## 2023-08-19 VITALS — BP 120/79 | HR 75 | Temp 97.3°F | Resp 12 | Ht 63.0 in | Wt 199.0 lb

## 2023-08-19 DIAGNOSIS — Z09 Encounter for follow-up examination after completed treatment for conditions other than malignant neoplasm: Secondary | ICD-10-CM | POA: Diagnosis not present

## 2023-08-19 DIAGNOSIS — D122 Benign neoplasm of ascending colon: Secondary | ICD-10-CM

## 2023-08-19 DIAGNOSIS — Z86010 Personal history of colonic polyps: Secondary | ICD-10-CM | POA: Diagnosis not present

## 2023-08-19 DIAGNOSIS — Z1211 Encounter for screening for malignant neoplasm of colon: Secondary | ICD-10-CM | POA: Diagnosis not present

## 2023-08-19 DIAGNOSIS — D120 Benign neoplasm of cecum: Secondary | ICD-10-CM

## 2023-08-19 DIAGNOSIS — E119 Type 2 diabetes mellitus without complications: Secondary | ICD-10-CM | POA: Diagnosis not present

## 2023-08-19 DIAGNOSIS — I10 Essential (primary) hypertension: Secondary | ICD-10-CM | POA: Diagnosis not present

## 2023-08-19 MED ORDER — SODIUM CHLORIDE 0.9 % IV SOLN: 500.0000 mL | INTRAVENOUS | Status: DC

## 2023-08-19 NOTE — Op Note (Signed)
[facility] Patient Name: Ashley Clark Procedure Date: 08/19/2023 11:41 AM MRN: [id number] Endoscopist: Lynann Bologna , MD, [id number] Age: 62 Referring MD:  Date of Birth: [birth year] Gender: Female Account #: [id number] Procedure:                Colonoscopy Indications:              High risk colon cancer surveillance: Personal                            history of colonic polypsn 2020 Medicines:                Monitored Anesthesia Care Procedure:                Pre-Anesthesia Assessment:                           - Prior to the procedure, a History and Physical                            was performed, and patient medications and                            allergies were reviewed. The patient's tolerance of                            previous anesthesia was also reviewed. The risks                            and benefits of the procedure and the sedation                            options and risks were discussed with the patient.                            All questions were answered, and informed consent                            was obtained. Prior Anticoagulants: The patient has                            taken no anticoagulant or antiplatelet agents. ASA                            Grade Assessment: II - A patient with mild systemic                            disease. After reviewing the risks and benefits,                            the patient was deemed in satisfactory condition to                            undergo the procedure.  After obtaining informed consent, the colonoscope                            was passed under direct vision. Throughout the                            procedure, the patient's blood pressure, pulse, and                            oxygen saturations were monitored continuously. The                            CF [id number] #[id number] was introduced through the anus                            and advanced to the 2 cm  into the ileum. The                            colonoscopy was performed without difficulty. The                            patient tolerated the procedure well. The quality                            of the bowel preparation was good. The terminal                            ileum, ileocecal valve, appendiceal orifice, and                            rectum were photographed. Scope In: 11:49:37 AM Scope Out: 12:04:38 PM Scope Withdrawal Time: 0 hours 11 minutes 43 seconds  Total Procedure Duration: 0 hours 15 minutes 1 second  Findings:                 Two sessile polyps were found in the proximal                            ascending colon and cecum. The polyps were 2 to 8                            mm in size. These polyps were removed with a cold                            snare. Resection and retrieval were complete.                           A few small-mouthed diverticula were found in the                            sig colon.                           Non-bleeding internal hemorrhoids were found during  retroflexion. The hemorrhoids were small and Grade                            I (internal hemorrhoids that do not prolapse).                           The terminal ileum appeared normal.                           The exam was otherwise without abnormality on                            direct and retroflexion views. Complications:            No immediate complications. Estimated Blood Loss:     Estimated blood loss: none. Impression:               - Two 2 to 8 mm polyps in the proximal ascending                            colon and in the cecum, removed with a cold snare.                            Resected and retrieved.                           - Mild sigmoid diverticulosis.                           - Non-bleeding internal hemorrhoids.                           - The examined portion of the ileum was normal.                           - The examination  was otherwise normal on direct                            and retroflexion views. Recommendation:           - Patient has a contact number available for                            emergencies. The signs and symptoms of potential                            delayed complications were discussed with the                            patient. Return to normal activities tomorrow.                            Written discharge instructions were provided to the                            patient.                           -  Resume previous diet.                           - Continue present medications.                           - Await pathology results.                           - Repeat colonoscopy for surveillance based on                            pathology results.                           - The findings and recommendations were discussed                            with the patient's family. Lynann Bologna, MD 08/19/2023 12:09:01 PM This report has been signed electronically.

## 2023-08-19 NOTE — Patient Instructions (Signed)
Discharge instructions given. Handouts on polyps,diverticulosis and hemorrhoids. Resume previous medications. YOU HAD AN ENDOSCOPIC PROCEDURE TODAY AT THE [facility]:   Refer to the procedure report that was given to you for any specific questions about what was found during the examination.  If the procedure report does not answer your questions, please call your gastroenterologist to clarify.  If you requested that your care partner not be given the details of your procedure findings, then the procedure report has been included in a sealed envelope for you to review at your convenience later.  YOU SHOULD EXPECT: Some feelings of bloating in the abdomen. Passage of more gas than usual.  Walking can help get rid of the air that was put into your GI tract during the procedure and reduce the bloating. If you had a lower endoscopy (such as a colonoscopy or flexible sigmoidoscopy) you may notice spotting of blood in your stool or on the toilet paper. If you underwent a bowel prep for your procedure, you may not have a normal bowel movement for a few days.  Please Note:  You might notice some irritation and congestion in your nose or some drainage.  This is from the oxygen used during your procedure.  There is no need for concern and it should clear up in a day or so.  SYMPTOMS TO REPORT IMMEDIATELY:  Following lower endoscopy (colonoscopy or flexible sigmoidoscopy):  Excessive amounts of blood in the stool  Significant tenderness or worsening of abdominal pains  Swelling of the abdomen that is new, acute  Fever of 100F or higher   For urgent or emergent issues, a gastroenterologist can be reached at any hour by calling [phone]. Do not use MyChart messaging for urgent concerns.    DIET:  We do recommend a small meal at first, but then you may proceed to your regular diet.  Drink plenty of fluids but you should avoid alcoholic beverages for 24 hours.  ACTIVITY:  You should  plan to take it easy for the rest of today and you should NOT DRIVE or use heavy machinery until tomorrow (because of the sedation medicines used during the test).    FOLLOW UP: Our staff will call the number listed on your records the next business day following your procedure.  We will call around 7:15- 8:00 am to check on you and address any questions or concerns that you may have regarding the information given to you following your procedure. If we do not reach you, we will leave a message.     If any biopsies were taken you will be contacted by phone or by letter within the next 1-3 weeks.  Please call us at [phone] if you have not heard about the biopsies in 3 weeks.    SIGNATURES/CONFIDENTIALITY: You and/or your care partner have signed paperwork which will be entered into your electronic medical record.  These signatures attest to the fact that that the information above on your After Visit Summary has been reviewed and is understood.  Full responsibility of the confidentiality of this discharge information lies with you and/or your care-partner. 

## 2023-08-19 NOTE — Progress Notes (Signed)
Pt's states no medical or surgical changes since previsit or office visit. 

## 2023-08-19 NOTE — Progress Notes (Signed)
Called to room to assist during endoscopic procedure.  Patient ID and intended procedure confirmed with present staff. Received instructions for my participation in the procedure from the performing physician.  

## 2023-08-19 NOTE — Progress Notes (Signed)
Sedate, gd SR, tolerated procedure well, VSS, report to RN 

## 2023-08-19 NOTE — Progress Notes (Signed)
[facility] History and Physical   Primary Care Physician:  [name], [name]   Reason for Procedure:   H/O polyps  Plan:    colon     HPI: Ashley Clark is a 62 y.o. female    Past Medical History:  Diagnosis Date   Allergy    Back pain    Cancer (HCC) 2019   left breast cancer-last radiation Dec.2019   Diabetes mellitus without complication (HCC)    Medical history non-contributory    Personal history of radiation therapy    Prediabetes    Tiredness    Vitamin D deficiency     Past Surgical History:  Procedure Laterality Date   BREAST BIOPSY Left 08/04/2018   x3   BREAST LUMPECTOMY Left    09-29-18   BREAST LUMPECTOMY WITH RADIOACTIVE SEED AND SENTINEL LYMPH NODE BIOPSY Left 09/29/2018   Procedure: LEFT BREAST LUMPECTOMY WITH RADIOACTIVE SEED AND SENTINEL LYMPH NODE BIOPSY;  Surgeon: [name], MD;  Location:  SURGERY CENTER;  Service: General;  Laterality: Left;   COLONOSCOPY  2016   POLYPECTOMY     WISDOM TOOTH EXTRACTION      Prior to Admission medications   Medication Sig Start Date End Date Taking? Authorizing Provider  calcium citrate-vitamin D (CITRACAL+D) 315-200 MG-UNIT tablet Take 1 tablet by mouth 2 (two) times daily.   Yes [provider]  cetirizine (ZYRTEC) 10 MG chewable tablet Chew 10 mg by mouth daily.   Yes [provider]  letrozole (FEMARA) 2.5 MG tablet Take 1 tablet (2.5 mg total) by mouth daily. 12/02/22  Yes [name], MD  losartan (COZAAR) 25 MG tablet Take 1 tablet (25 mg total) by mouth daily for kidney protection. 06/26/22  Yes   atorvastatin (LIPITOR) 10 MG tablet Take 1 tablet (10 mg total) by mouth daily. Patient not taking: Reported on 08/19/2023 08/11/23     COVID-19 mRNA bivalent vaccine, Pfizer, (PFIZER COVID-19 VAC BIVALENT) injection Inject into the muscle. Patient not taking: Reported on 07/22/2023 01/10/22     ibuprofen (ADVIL,MOTRIN) 800 MG tablet Take 1 tablet (800 mg total)  by mouth every 8 (eight) hours as needed. 09/29/18   Cornett, Maisie Fus, MD  influenza vac split quadrivalent PF (FLUARIX) 0.5 ML injection Inject 0.5 mLs into the muscle. Patient not taking: Reported on 07/22/2023 10/03/22   Judyann Munson, MD  losartan (COZAAR) 25 MG tablet Take 1 tablet (25 mg total) by mouth daily for kidney protection 02/06/23     Vitamin D, Ergocalciferol, (DRISDOL) 1.25 MG (50000 UNIT) CAPS capsule Take 1 capsule (50,000 Units total) by mouth every 7 (seven) days. Patient not taking: Reported on 07/22/2023 09/04/22   Thomasene Lot, DO    Current Outpatient Medications  Medication Sig Dispense Refill   calcium citrate-vitamin D (CITRACAL+D) 315-200 MG-UNIT tablet Take 1 tablet by mouth 2 (two) times daily.     cetirizine (ZYRTEC) 10 MG chewable tablet Chew 10 mg by mouth daily.     letrozole (FEMARA) 2.5 MG tablet Take 1 tablet (2.5 mg total) by mouth daily. 90 tablet 4   losartan (COZAAR) 25 MG tablet Take 1 tablet (25 mg total) by mouth daily for kidney protection. 90 tablet 3   atorvastatin (LIPITOR) 10 MG tablet Take 1 tablet (10 mg total) by mouth daily. (Patient not taking: Reported on 08/19/2023) 30 tablet 5   COVID-19 mRNA bivalent vaccine, Pfizer, (PFIZER COVID-19 VAC BIVALENT) injection Inject into the muscle. (Patient not taking: Reported on 07/22/2023) 0.3 mL  0   ibuprofen (ADVIL,MOTRIN) 800 MG tablet Take 1 tablet (800 mg total) by mouth every 8 (eight) hours as needed. 30 tablet 0   influenza vac split quadrivalent PF (FLUARIX) 0.5 ML injection Inject 0.5 mLs into the muscle. (Patient not taking: Reported on 07/22/2023) 0.5 mL 0   losartan (COZAAR) 25 MG tablet Take 1 tablet (25 mg total) by mouth daily for kidney protection 90 tablet 3   Vitamin D, Ergocalciferol, (DRISDOL) 1.25 MG (50000 UNIT) CAPS capsule Take 1 capsule (50,000 Units total) by mouth every 7 (seven) days. (Patient not taking: Reported on 07/22/2023) 4 capsule 0   Current Facility-Administered  Medications  Medication Dose Route Frequency Provider Last Rate Last Admin   0.9 %  sodium chloride infusion  500 mL Intravenous Continuous Lynann Bologna, MD        Allergies as of 08/19/2023   (No Known Allergies)    Family History  Problem Relation Age of Onset   Breast cancer Mother 65   Colon polyps Mother    Colon cancer Neg Hx    Rectal cancer Neg Hx    Stomach cancer Neg Hx    Esophageal cancer Neg Hx     Social History   Socioeconomic History   Marital status: Married    Spouse name: Not on file   Number of children: Not on file   Years of education: Not on file   Highest education level: Not on file  Occupational History   Not on file  Tobacco Use   Smoking status: Former    Current packs/day: 0.00    Types: Cigarettes    Quit date: 01/2015    Years since quitting: 8.5   Smokeless tobacco: Never   Tobacco comments:    3-4 cigs a day   Vaping Use   Vaping status: Never Used  Substance and Sexual Activity   Alcohol use: Yes    Alcohol/week: 4.0 standard drinks of alcohol    Types: 4 Glasses of wine per week   Drug use: No   Sexual activity: Not on file  Other Topics Concern   Not on file  Social History Narrative   Not on file   Social Determinants of Health   Financial Resource Strain: Not on file  Food Insecurity: Not on file  Transportation Needs: No Transportation Needs (02/07/2019)   PRAPARE - Transportation    Lack of Transportation (Medical): No    Lack of Transportation (Non-Medical): No  Physical Activity: Not on file  Stress: Not on file  Social Connections: Not on file  Intimate Partner Violence: Unknown (11/04/2018)   Humiliation, Afraid, Rape, and Kick questionnaire    Fear of Current or Ex-Partner: No    Emotionally Abused: No    Physically Abused: No    Sexually Abused: Not on file    Review of Systems: Positive for none All other review of systems negative except as mentioned in the HPI.  Physical Exam: Vital signs in  last 24 hours: @VSRANGES @   General:   Alert,  Well-developed, well-nourished, pleasant and cooperative in NAD Lungs:  Clear throughout to auscultation.   Heart:  Regular rate and rhythm; no murmurs, clicks, rubs,  or gallops. Abdomen:  Soft, nontender and nondistended. Normal bowel sounds.   Neuro/Psych:  Alert and cooperative. Normal mood and affect. A and O x 3    No significant changes were identified.  The patient continues to be an appropriate candidate for the planned procedure and anesthesia.  Edman Circle, MD. [facility] 08/19/2023 11:43 AM@

## 2023-08-20 ENCOUNTER — Telehealth: Payer: Self-pay

## 2023-08-20 NOTE — Telephone Encounter (Signed)
  Follow up Call-     08/19/2023   10:51 AM  Call back number  Post procedure Call Back phone  # [phone]  Permission to leave phone message Yes     Patient questions:  Do you have a fever, pain , or abdominal swelling? No. Pain Score  0 *  Have you tolerated food without any problems? Yes.    Have you been able to return to your normal activities? Yes.    Do you have any questions about your discharge instructions: Diet   No. Medications  No. Follow up visit  No.  Do you have questions or concerns about your Care? No.  Actions: * If pain score is 4 or above: No action needed, pain <4.

## 2023-08-23 ENCOUNTER — Encounter: Payer: Self-pay | Admitting: Gastroenterology

## 2023-09-24 ENCOUNTER — Other Ambulatory Visit (HOSPITAL_BASED_OUTPATIENT_CLINIC_OR_DEPARTMENT_OTHER): Payer: Self-pay

## 2023-09-24 MED ORDER — INFLUENZA VIRUS VACC SPLIT PF (FLUZONE) 0.5 ML IM SUSY
0.5000 mL | PREFILLED_SYRINGE | Freq: Once | INTRAMUSCULAR | 0 refills | Status: AC
  Filled 2023-09-25: qty 0.5, 1d supply, fill #0

## 2023-09-25 ENCOUNTER — Other Ambulatory Visit (HOSPITAL_BASED_OUTPATIENT_CLINIC_OR_DEPARTMENT_OTHER): Payer: Self-pay

## 2023-10-08 ENCOUNTER — Other Ambulatory Visit (HOSPITAL_BASED_OUTPATIENT_CLINIC_OR_DEPARTMENT_OTHER): Payer: Self-pay

## 2023-10-08 MED ORDER — COVID-19 MRNA VAC-TRIS(PFIZER) 30 MCG/0.3ML IM SUSY
0.3000 mL | PREFILLED_SYRINGE | Freq: Once | INTRAMUSCULAR | 0 refills | Status: AC
  Filled 2023-10-08: qty 0.3, 1d supply, fill #0

## 2023-10-26 ENCOUNTER — Ambulatory Visit
Admission: RE | Admit: 2023-10-26 | Discharge: 2023-10-26 | Disposition: A | Discharge status: Home / Self Care | Payer: 59 | Source: Ambulatory Visit | Attending: Internal Medicine | Admitting: Internal Medicine

## 2023-10-26 ENCOUNTER — Ambulatory Visit (INDEPENDENT_AMBULATORY_CARE_PROVIDER_SITE_OTHER): Payer: 59

## 2023-10-26 VITALS — BP 106/70 | HR 66 | Temp 99.2°F | Resp 17

## 2023-10-26 DIAGNOSIS — R0602 Shortness of breath: Secondary | ICD-10-CM | POA: Diagnosis not present

## 2023-10-26 DIAGNOSIS — R1013 Epigastric pain: Secondary | ICD-10-CM | POA: Diagnosis not present

## 2023-10-26 DIAGNOSIS — K5900 Constipation, unspecified: Secondary | ICD-10-CM | POA: Diagnosis not present

## 2023-10-26 NOTE — Discharge Instructions (Signed)
Please go to the ER for further evaluation of your symptoms  

## 2023-10-26 NOTE — ED Triage Notes (Addendum)
Pt presents with c/o chest tightness x 3 wks. Pt states she took OTC meds. Denies coughing.  Has sob. Has not been sick recently.   States the tightness has been constant, has not been able to eat. Feels she is constipated.

## 2023-10-26 NOTE — ED Provider Notes (Signed)
[facility]    CSN: [id number] Arrival date & time: 10/26/23  1026      History   Chief Complaint Chief Complaint  Patient presents with   Generalized Body Aches    Chest tightness shortness of breath - Entered by patient    HPI Ashley Clark is a 62 y.o. female presents for epigastric pain and shortness of breath.  Patient reports 2 weeks of a persistent nonradiating epigastric pain that she describes as a tightness that is associated with shortness of breath and upper abdominal pain.  Denies any nausea/vomiting, dizziness, syncope, palpitations, diaphoresis.  Does endorse she feels bloated and states that she went on a cruise in September she has been having constipation  She does report daily small bowel movements.  She took some Metamucil yesterday without improvement.  She taking fluids normally but does have a decreased appetite.  She does have a history of diabetes and hyperlipidemia but denies history of hypertension or CAD.  She is a previous smoker, quit 5 years ago.  No family history of CAD.  Cannot identify any aggravating or alleviating factors for her symptoms.  No history of GERD. No cough or cold sx. No dysuria.  No other concerns at this time.  HPI  Past Medical History:  Diagnosis Date   Allergy    Back pain    Cancer (HCC) 2019   left breast cancer-last radiation Dec.2019   Diabetes mellitus without complication [facility])    Medical history non-contributory    Personal history of radiation therapy    Prediabetes    Tiredness    Vitamin D deficiency     Patient Active Problem List   Diagnosis Date Noted   At risk for side effect of medication 09/04/2022   Hypertension associated with type 2 diabetes mellitus (HCC) 07/20/2022   Diabetes mellitus (HCC) 06/25/2022   Hyperlipidemia associated with type 2 diabetes mellitus (HCC) 06/25/2022   Vitamin D deficiency 06/25/2022   At risk for heart disease 06/25/2022   Malignant neoplasm of overlapping  sites of left breast in female, estrogen receptor positive (HCC) 08/18/2018    Past Surgical History:  Procedure Laterality Date   BREAST BIOPSY Left 08/04/2018   x3   BREAST LUMPECTOMY Left    09-29-18   BREAST LUMPECTOMY WITH RADIOACTIVE SEED AND SENTINEL LYMPH NODE BIOPSY Left 09/29/2018   Procedure: LEFT BREAST LUMPECTOMY WITH RADIOACTIVE SEED AND SENTINEL LYMPH NODE BIOPSY;  Surgeon: [name], MD;  Location: [facility];  Service: General;  Laterality: Left;   COLONOSCOPY  2016   POLYPECTOMY     WISDOM TOOTH EXTRACTION      OB History     Gravida  1   Para  1   Term      Preterm      AB      Living         SAB      IAB      Ectopic      Multiple      Live Births               Home Medications    Prior to Admission medications   Medication Sig Start Date End Date Taking? Authorizing Provider  atorvastatin (LIPITOR) 10 MG tablet Take 1 tablet (10 mg total) by mouth daily. Patient not taking: Reported on 08/19/2023 08/11/23     calcium citrate-vitamin D (CITRACAL+D) 315-200 MG-UNIT tablet Take 1 tablet by mouth 2 (two) times  daily.    [provider]  cetirizine (ZYRTEC) 10 MG chewable tablet Chew 10 mg by mouth daily.    [provider]  COVID-19 mRNA bivalent vaccine, Pfizer, (PFIZER COVID-19 VAC BIVALENT) injection Inject into the muscle. Patient not taking: Reported on 07/22/2023 01/10/22     ibuprofen (ADVIL,MOTRIN) 800 MG tablet Take 1 tablet (800 mg total) by mouth every 8 (eight) hours as needed. 09/29/18   Cornett, Maisie Fus, MD  influenza vac split quadrivalent PF (FLUARIX) 0.5 ML injection Inject 0.5 mLs into the muscle. Patient not taking: Reported on 07/22/2023 10/03/22   Judyann Munson, MD  letrozole [facility]) 2.5 MG tablet Take 1 tablet (2.5 mg total) by mouth daily. 12/02/22   [name], MD  losartan (COZAAR) 25 MG tablet Take 1 tablet (25 mg total) by mouth daily for kidney protection. 06/26/22     losartan  (COZAAR) 25 MG tablet Take 1 tablet (25 mg total) by mouth daily for kidney protection 02/06/23     Vitamin D, Ergocalciferol, (DRISDOL) 1.25 MG (50000 UNIT) CAPS capsule Take 1 capsule (50,000 Units total) by mouth every 7 (seven) days. Patient not taking: Reported on 07/22/2023 09/04/22   Thomasene Lot, DO    Family History Family History  Problem Relation Age of Onset   Breast cancer Mother 45   Colon polyps Mother    Colon cancer Neg Hx    Rectal cancer Neg Hx    Stomach cancer Neg Hx    Esophageal cancer Neg Hx     Social History Social History   Tobacco Use   Smoking status: Former    Current packs/day: 0.00    Types: Cigarettes    Quit date: 01/2015    Years since quitting: 8.7   Smokeless tobacco: Never   Tobacco comments:    3-4 cigs a day   Vaping Use   Vaping status: Never Used  Substance Use Topics   Alcohol use: Yes    Alcohol/week: 4.0 standard drinks of alcohol    Types: 4 Glasses of wine per week   Drug use: No     Allergies   Patient has no known allergies.   Review of Systems Review of Systems  Respiratory:  Positive for chest tightness and shortness of breath.   Gastrointestinal:  Positive for abdominal distention and abdominal pain.     Physical Exam Triage Vital Signs ED Triage Vitals  Encounter Vitals Group     BP 10/26/23 1037 106/70     Systolic BP Percentile --      Diastolic BP Percentile --      Pulse Rate 10/26/23 1036 66     Resp 10/26/23 1036 17     Temp 10/26/23 1036 99.2 F (37.3 C)     Temp Source 10/26/23 1036 Oral     SpO2 10/26/23 1036 98 %     Weight --      Height --      Head Circumference --      Peak Flow --      Pain Score 10/26/23 1036 0     Pain Loc --      Pain Education --      Exclude from Growth Chart --    No data found.  Updated Vital Signs BP 106/70   Pulse 66   Temp 99.2 F (37.3 C) (Oral)   Resp 17   LMP 03/12/2015   SpO2 98%   Visual Acuity Right Eye Distance:   Left Eye Distance:  Bilateral Distance:    Right Eye Near:   Left Eye Near:    Bilateral Near:     Physical Exam Vitals and nursing note reviewed.  Constitutional:      General: She is not in acute distress.    Appearance: Normal appearance. She is not ill-appearing, toxic-appearing or diaphoretic.  HENT:     Head: Normocephalic and atraumatic.  Eyes:     Pupils: Pupils are equal, round, and reactive to light.  Cardiovascular:     Rate and Rhythm: Normal rate and regular rhythm.     Heart sounds: Normal heart sounds.  Pulmonary:     Effort: Pulmonary effort is normal.     Breath sounds: Normal breath sounds.  Chest:     Chest wall: No tenderness.  Abdominal:     General: Bowel sounds are normal. There is no distension.     Palpations: Abdomen is soft. There is no hepatomegaly or splenomegaly.     Tenderness: There is abdominal tenderness in the epigastric area. Negative signs include Rovsing's sign and McBurney's sign.       Comments: Moderate TTP to epigastric area and RUQ and LUQ pain. Abd is obese  Skin:    General: Skin is warm and dry.  Neurological:     General: No focal deficit present.     Mental Status: She is alert and oriented to person, place, and time.  Psychiatric:        Mood and Affect: Mood normal.        Behavior: Behavior normal.      UC Treatments / Results  Labs (all labs ordered are listed, but only abnormal results are displayed) Labs Reviewed - No data to display  EKG   Radiology No results found.  Procedures ED EKG  Date/Time: 10/26/2023 11:14 AM  Performed by: Radford Pax, NP Authorized by: Radford Pax, NP   ECG interpreted by ED Physician in the absence of a cardiologist: no   Previous ECG:    Previous ECG:  Compared to current Interpretation:    Interpretation: normal   Rate:    ECG rate:  85   ECG rate assessment: normal   Rhythm:    Rhythm: sinus rhythm   Ectopy:    Ectopy: none   QRS:    QRS axis:  Normal ST segments:    ST  segments:  Normal T waves:    T waves: normal   Q waves:    Abnormal Q-waves: not present    (including critical care time)  Medications Ordered in UC Medications - No data to display  Initial Impression / Assessment and Plan / UC Course  I have reviewed the triage vital signs and the nursing notes.  Pertinent labs & imaging results that were available during my care of the patient were reviewed by me and considered in my medical decision making (see chart for details).     I reviewed exam and sx with patient. EKG without acute changes. CXR and abd xray wet reads shows no acute findings to explain pt symptoms. Given her medical hx and sx I advised she go to the ER for further evaluation. Pt states she needs to go back to work first. I advised my medical advise is she go directly to the ER and pt verbalized understanding.  Final Clinical Impressions(s) / UC Diagnoses   Final diagnoses:  Shortness of breath  Abdominal pain, epigastric     Discharge Instructions  Please go to the ER for further evaluation of your symptoms     ED Prescriptions   None    PDMP not reviewed this encounter.   Radford Pax, NP 10/26/23 1122

## 2023-10-29 ENCOUNTER — Encounter (HOSPITAL_BASED_OUTPATIENT_CLINIC_OR_DEPARTMENT_OTHER): Payer: Self-pay | Admitting: Emergency Medicine

## 2023-10-29 ENCOUNTER — Other Ambulatory Visit: Payer: Self-pay

## 2023-10-29 ENCOUNTER — Emergency Department (HOSPITAL_BASED_OUTPATIENT_CLINIC_OR_DEPARTMENT_OTHER): Payer: 59

## 2023-10-29 ENCOUNTER — Inpatient Hospital Stay (HOSPITAL_BASED_OUTPATIENT_CLINIC_OR_DEPARTMENT_OTHER)
Admission: EM | Admit: 2023-10-29 | Discharge: 2023-11-03 | DRG: 423 | Disposition: A | Discharge status: Home / Self Care | Payer: 59 | Attending: Internal Medicine | Admitting: Internal Medicine

## 2023-10-29 DIAGNOSIS — F17210 Nicotine dependence, cigarettes, uncomplicated: Secondary | ICD-10-CM | POA: Diagnosis present

## 2023-10-29 DIAGNOSIS — E669 Obesity, unspecified: Secondary | ICD-10-CM | POA: Diagnosis not present

## 2023-10-29 DIAGNOSIS — Z83719 Family history of colon polyps, unspecified: Secondary | ICD-10-CM

## 2023-10-29 DIAGNOSIS — R0789 Other chest pain: Secondary | ICD-10-CM | POA: Diagnosis not present

## 2023-10-29 DIAGNOSIS — K8689 Other specified diseases of pancreas: Secondary | ICD-10-CM | POA: Diagnosis not present

## 2023-10-29 DIAGNOSIS — Z923 Personal history of irradiation: Secondary | ICD-10-CM | POA: Diagnosis not present

## 2023-10-29 DIAGNOSIS — Z7901 Long term (current) use of anticoagulants: Secondary | ICD-10-CM | POA: Diagnosis not present

## 2023-10-29 DIAGNOSIS — Z6836 Body mass index (BMI) 36.0-36.9, adult: Secondary | ICD-10-CM | POA: Diagnosis not present

## 2023-10-29 DIAGNOSIS — Z170 Estrogen receptor positive status [ER+]: Secondary | ICD-10-CM

## 2023-10-29 DIAGNOSIS — E871 Hypo-osmolality and hyponatremia: Secondary | ICD-10-CM | POA: Diagnosis present

## 2023-10-29 DIAGNOSIS — I2699 Other pulmonary embolism without acute cor pulmonale: Secondary | ICD-10-CM

## 2023-10-29 DIAGNOSIS — Z86711 Personal history of pulmonary embolism: Secondary | ICD-10-CM | POA: Diagnosis not present

## 2023-10-29 DIAGNOSIS — K769 Liver disease, unspecified: Secondary | ICD-10-CM | POA: Diagnosis not present

## 2023-10-29 DIAGNOSIS — C259 Malignant neoplasm of pancreas, unspecified: Secondary | ICD-10-CM | POA: Diagnosis not present

## 2023-10-29 DIAGNOSIS — E1169 Type 2 diabetes mellitus with other specified complication: Secondary | ICD-10-CM | POA: Diagnosis not present

## 2023-10-29 DIAGNOSIS — Z803 Family history of malignant neoplasm of breast: Secondary | ICD-10-CM

## 2023-10-29 DIAGNOSIS — C50812 Malignant neoplasm of overlapping sites of left female breast: Secondary | ICD-10-CM | POA: Diagnosis not present

## 2023-10-29 DIAGNOSIS — R7303 Prediabetes: Secondary | ICD-10-CM | POA: Diagnosis present

## 2023-10-29 DIAGNOSIS — Z79899 Other long term (current) drug therapy: Secondary | ICD-10-CM | POA: Diagnosis not present

## 2023-10-29 DIAGNOSIS — E1159 Type 2 diabetes mellitus with other circulatory complications: Secondary | ICD-10-CM | POA: Diagnosis not present

## 2023-10-29 DIAGNOSIS — R1013 Epigastric pain: Secondary | ICD-10-CM | POA: Diagnosis not present

## 2023-10-29 DIAGNOSIS — C251 Malignant neoplasm of body of pancreas: Secondary | ICD-10-CM

## 2023-10-29 DIAGNOSIS — Z79811 Long term (current) use of aromatase inhibitors: Secondary | ICD-10-CM | POA: Diagnosis not present

## 2023-10-29 DIAGNOSIS — R079 Chest pain, unspecified: Secondary | ICD-10-CM | POA: Diagnosis present

## 2023-10-29 DIAGNOSIS — E119 Type 2 diabetes mellitus without complications: Secondary | ICD-10-CM

## 2023-10-29 DIAGNOSIS — Z853 Personal history of malignant neoplasm of breast: Secondary | ICD-10-CM | POA: Diagnosis not present

## 2023-10-29 DIAGNOSIS — C787 Secondary malignant neoplasm of liver and intrahepatic bile duct: Secondary | ICD-10-CM | POA: Diagnosis not present

## 2023-10-29 DIAGNOSIS — C801 Malignant (primary) neoplasm, unspecified: Secondary | ICD-10-CM | POA: Diagnosis not present

## 2023-10-29 DIAGNOSIS — I152 Hypertension secondary to endocrine disorders: Secondary | ICD-10-CM | POA: Diagnosis present

## 2023-10-29 DIAGNOSIS — R160 Hepatomegaly, not elsewhere classified: Secondary | ICD-10-CM | POA: Diagnosis not present

## 2023-10-29 DIAGNOSIS — I2693 Single subsegmental pulmonary embolism without acute cor pulmonale: Secondary | ICD-10-CM | POA: Diagnosis not present

## 2023-10-29 HISTORY — DX: Other pulmonary embolism without acute cor pulmonale: I26.99

## 2023-10-29 LAB — CBC
HCT: 40.3 % (ref 36.0–46.0)
Hemoglobin: 13.1 g/dL (ref 12.0–15.0)
MCH: 25.6 pg — ABNORMAL LOW (ref 26.0–34.0)
MCHC: 32.5 g/dL (ref 30.0–36.0)
MCV: 78.7 fL — ABNORMAL LOW (ref 80.0–100.0)
Platelets: 367 10*3/uL (ref 150–400)
RBC: 5.12 MIL/uL — ABNORMAL HIGH (ref 3.87–5.11)
RDW: 13.7 % (ref 11.5–15.5)
WBC: 8.5 10*3/uL (ref 4.0–10.5)
nRBC: 0 % (ref 0.0–0.2)

## 2023-10-29 LAB — TROPONIN I (HIGH SENSITIVITY)
Troponin I (High Sensitivity): 5 ng/L (ref <18)
Troponin I (High Sensitivity): 5 ng/L (ref <18)

## 2023-10-29 LAB — HEPATIC FUNCTION PANEL
ALT: 20 U/L (ref 0–44)
AST: 31 U/L (ref 15–41)
Albumin: 3.8 g/dL (ref 3.5–5.0)
Alkaline Phosphatase: 216 U/L — ABNORMAL HIGH (ref 38–126)
Bilirubin, Direct: 0.2 mg/dL (ref 0.0–0.2)
Indirect Bilirubin: 0.6 mg/dL (ref 0.3–0.9)
Total Bilirubin: 0.8 mg/dL (ref 0.3–1.2)
Total Protein: 7.9 g/dL (ref 6.5–8.1)

## 2023-10-29 LAB — BASIC METABOLIC PANEL
Anion gap: 14 (ref 5–15)
BUN: 6 mg/dL — ABNORMAL LOW (ref 8–23)
CO2: 23 mmol/L (ref 22–32)
Calcium: 8.8 mg/dL — ABNORMAL LOW (ref 8.9–10.3)
Chloride: 93 mmol/L — ABNORMAL LOW (ref 98–111)
Creatinine, Ser: 0.61 mg/dL (ref 0.44–1.00)
GFR, Estimated: >60 mL/min (ref >60)
Glucose, Bld: 125 mg/dL — ABNORMAL HIGH (ref 70–99)
Potassium: 3.7 mmol/L (ref 3.5–5.1)
Sodium: 130 mmol/L — ABNORMAL LOW (ref 135–145)

## 2023-10-29 LAB — LIPASE, BLOOD: Lipase: 59 U/L — ABNORMAL HIGH (ref 11–51)

## 2023-10-29 LAB — HEPARIN LEVEL (UNFRACTIONATED): Heparin Unfractionated: 0.17 [IU]/mL — ABNORMAL LOW (ref 0.30–0.70)

## 2023-10-29 MED ORDER — LORATADINE 10 MG PO TABS
10.0000 mg | ORAL_TABLET | Freq: Every day | ORAL | Status: DC
  Administered 2023-10-30 – 2023-11-01 (×3): 10 mg via ORAL
  Filled 2023-10-29 (×4): qty 1

## 2023-10-29 MED ORDER — HYDROXYZINE HCL 25 MG PO TABS
25.0000 mg | ORAL_TABLET | Freq: Once | ORAL | Status: AC
  Administered 2023-10-29: 25 mg via ORAL
  Filled 2023-10-29: qty 1

## 2023-10-29 MED ORDER — LOSARTAN POTASSIUM 25 MG PO TABS
25.0000 mg | ORAL_TABLET | Freq: Every day | ORAL | Status: DC
  Administered 2023-10-30: 25 mg via ORAL
  Filled 2023-10-29: qty 1

## 2023-10-29 MED ORDER — IOHEXOL 350 MG/ML SOLN
100.0000 mL | Freq: Once | INTRAVENOUS | Status: AC | PRN
  Administered 2023-10-29: 100 mL via INTRAVENOUS

## 2023-10-29 MED ORDER — HEPARIN (PORCINE) 25000 UT/250ML-% IV SOLN
1500.0000 [IU]/h | INTRAVENOUS | Status: DC
  Administered 2023-10-29: 1150 [IU]/h via INTRAVENOUS
  Administered 2023-10-30: 1400 [IU]/h via INTRAVENOUS
  Filled 2023-10-29 (×2): qty 250

## 2023-10-29 MED ORDER — TRAMADOL HCL 50 MG PO TABS
50.0000 mg | ORAL_TABLET | Freq: Four times a day (QID) | ORAL | Status: DC | PRN
  Administered 2023-10-31 – 2023-11-02 (×3): 50 mg via ORAL
  Filled 2023-10-29 (×3): qty 1

## 2023-10-29 MED ORDER — HEPARIN BOLUS VIA INFUSION
5000.0000 [IU] | Freq: Once | INTRAVENOUS | Status: AC
  Administered 2023-10-29: 5000 [IU] via INTRAVENOUS

## 2023-10-29 MED ORDER — HEPARIN SODIUM (PORCINE) 5000 UNIT/ML IJ SOLN
INTRAMUSCULAR | Status: AC
  Filled 2023-10-29: qty 1

## 2023-10-29 MED ORDER — LETROZOLE 2.5 MG PO TABS
2.5000 mg | ORAL_TABLET | Freq: Every day | ORAL | Status: DC
  Filled 2023-10-29: qty 1

## 2023-10-29 MED ORDER — ACETAMINOPHEN 325 MG PO TABS: 650.0000 mg | ORAL_TABLET | Freq: Four times a day (QID) | ORAL | Status: DC | PRN

## 2023-10-29 MED ORDER — MORPHINE SULFATE (PF) 4 MG/ML IV SOLN
4.0000 mg | Freq: Once | INTRAVENOUS | Status: DC
  Filled 2023-10-29: qty 1

## 2023-10-29 MED ORDER — SODIUM CHLORIDE 0.9 % IV BOLUS
500.0000 mL | Freq: Once | INTRAVENOUS | Status: AC
  Administered 2023-10-29: 500 mL via INTRAVENOUS

## 2023-10-29 NOTE — H&P (Signed)
History and Physical    Patient: Ashley Clark JYN:[id number] DOB: 09/01/1961 DOA: 10/29/2023 DOS: the patient was seen and examined on 10/29/2023 PCP: [name], PA  Patient coming from:  [facility] ED  Chief Complaint:  Chief Complaint  Patient presents with   Chest Pain   HPI: Ashley Clark is a 62 y.o. female with medical history significant of left breast cancer diagnosed in 2019 s/p left lumpectomy, radiation, on current antiestrogen therapy, hypertension, type 2 diabetes who presented to ED with epigastric and chest pain.   For the past 3 weeks, she has been experiencing constant lower midsternal chest pain and epigastric pain.  Feels like there is a "blockage "there.  Feels like her food gets stuck and will not pass any further.  Therefore, has not been eating much but has been staying hydrated with fluids.  Denies any nausea or vomiting.  Has been constipated and tried to take magnesium citrate a few days ago with resulting runny stools.  However thinks her decreased bowel movement is due to lack of appetite and eating less.  Denies any weight loss. She has history of left breast cancer and is compliant with her letrozole.  She recently had mammogram several months ago in July that was benign.  Also had a routine colonoscopy in August with tubular adenomatous polyps removed.   On arrival to the ED, she was afebrile heart rate in the 80s to 90s, normal respiratory rate, borderline soft BP of 95/69 on room air.  CTA chest/abdomen/pelvis was obtained revealing small volume occlusive thrombus in the distal segment/proximal subsegmental pulmonary RV of the right lower lobe.  No right heart strain or lung infarct.  However there was a new ill-defined lesion in the pancreatic neck/proximal body favoring neoplasm.  There are also ill-defined lesions in the liver concerning for metastasis.  There is also a small segment of complete occlusion versus marked narrowing of the  uppermost portion of superior mesenteric vein at the level of the formational portal vein.  Patient was started on IV heparin.  ED physician consulted oncology Dr. Pamelia Hoit who is her primary oncologist who will see her in consultation.  Patient transferred [facility] to [facility] for further management.  Patient appears to remain optimistic despite her imaging results. She has a significant number of supportive family members at bedside.  She is still planning to pursue aggressive therapy pending further workup.  Currently, she would just like to try to eat and something to help her with sleep.  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  Review of Systems: As mentioned in the history of present illness. All other systems reviewed and are negative. Past Medical History:  Diagnosis Date   Allergy    Back pain    Cancer (HCC) 2019   left breast cancer-last radiation Dec.2019   Diabetes mellitus without complication (HCC)    Medical history non-contributory    Personal history of radiation therapy    Prediabetes    Tiredness    Vitamin D deficiency    Past Surgical History:  Procedure Laterality Date   BREAST BIOPSY Left 08/04/2018   x3   BREAST LUMPECTOMY Left    09-29-18   BREAST LUMPECTOMY WITH RADIOACTIVE SEED AND SENTINEL LYMPH NODE BIOPSY Left 09/29/2018   Procedure: LEFT BREAST LUMPECTOMY WITH RADIOACTIVE SEED AND SENTINEL LYMPH NODE BIOPSY;  Surgeon: [name], MD;  Location: [facility];  Service: General;  Laterality: Left;   COLONOSCOPY  2016   POLYPECTOMY     WISDOM TOOTH EXTRACTION     Social History:  reports that she quit smoking about 8 years ago. Her smoking use included cigarettes. She has never used smokeless tobacco. She reports current alcohol use of about 4.0 standard drinks of alcohol per week. She reports that she does not use drugs.  No Known Allergies  Family History  Problem Relation Age of Onset   Breast cancer Mother 29   Colon  polyps Mother    Colon cancer Neg Hx    Rectal cancer Neg Hx    Stomach cancer Neg Hx    Esophageal cancer Neg Hx     Prior to Admission medications   Medication Sig Start Date End Date Taking? Authorizing Provider  atorvastatin (LIPITOR) 10 MG tablet Take 1 tablet (10 mg total) by mouth daily. Patient not taking: Reported on 08/19/2023 08/11/23     calcium citrate-vitamin D (CITRACAL+D) 315-200 MG-UNIT tablet Take 1 tablet by mouth 2 (two) times daily.    [provider]  cetirizine (ZYRTEC) 10 MG chewable tablet Chew 10 mg by mouth daily.    [provider]  COVID-19 mRNA bivalent vaccine, Pfizer, (PFIZER COVID-19 VAC BIVALENT) injection Inject into the muscle. Patient not taking: Reported on 07/22/2023 01/10/22     ibuprofen (ADVIL,MOTRIN) 800 MG tablet Take 1 tablet (800 mg total) by mouth every 8 (eight) hours as needed. 09/29/18   Cornett, Maisie Fus, MD  influenza vac split quadrivalent PF (FLUARIX) 0.5 ML injection Inject 0.5 mLs into the muscle. Patient not taking: Reported on 07/22/2023 10/03/22   Judyann Munson, MD  letrozole [facility]) 2.5 MG tablet Take 1 tablet (2.5 mg total) by mouth daily. 12/02/22   [name], MD  losartan (COZAAR) 25 MG tablet Take 1 tablet (25 mg total) by mouth daily for kidney protection. 06/26/22     losartan (COZAAR) 25 MG tablet Take 1 tablet (25 mg total) by mouth daily for kidney protection 02/06/23     Vitamin D, Ergocalciferol, (DRISDOL) 1.25 MG (50000 UNIT) CAPS capsule Take 1 capsule (50,000 Units total) by mouth every 7 (seven) days. Patient not taking: Reported on 07/22/2023 09/04/22   Thomasene Lot, DO    Physical Exam: Vitals:   10/29/23 1545 10/29/23 1630 10/29/23 1715 10/29/23 2047  BP: 135/81 (!) 90/57 115/87 113/83  Pulse: 98 91 90 93  Resp: 20 20 18 16   Temp: 99 F (37.2 C) 99 F (37.2 C)  99 F (37.2 C)  TempSrc:    Oral  SpO2: 100% 98% 94% 98%  Weight:      Height:       Constitutional: NAD, calm, comfortable,  obese elderly female appearing in Russi is lying bed.  Patient frequently would grimace to the pain to her chest and epigastric region. Eyes: lids and conjunctivae normal ENMT: Mucous membranes are moist.  Neck: normal, supple Respiratory: clear to auscultation bilaterally, no wheezing, no crackles. Normal respiratory effort. No accessory muscle use.  Cardiovascular: Regular rate and rhythm, no murmurs / rubs / gallops. No extremity edema. Abdomen: Soft, severe tenderness to epigastric and lower midsternal region without any rebound tenderness, guarding or rigidity.  Nondistended. Musculoskeletal: no clubbing / cyanosis. No joint deformity upper and lower extremities. Good ROM, no contractures. Normal muscle tone.  Skin: no rashes, lesions, ulcers. No induration Neurologic: CN 2-12 grossly intact. Psychiatric: Normal judgment and insight. Alert and oriented x 3. Normal and pleasant mood.   Data Reviewed:  See HPI  Assessment and Plan: * Pancreatic mass Liver lesions  -on CT abd/pelvis there is an ill-defined 2.0 x 2.9 cm hypoattenuating lesion in the pancreatic neck/proximal body which is favored neoplastic. There are multiple ill-defined liver masses, compatible with metastases.There is short segment complete occlusion versus marked narrowing of the upper most portion of superior mesenteric vein at the level of formation of portal vein. -oncology Dr. Pamelia Hoit who is her primary was consulted  -PRN pain control with Tylenol and Tramadol. Pt was to avoid trying other types of narcotics if possible.     Obesity (BMI 30-39.9) BMI of 36  Acute pulmonary embolism (HCC) -provoked in the setting of new malignancy -There is a small volume occlusive thrombus in the distal segmental/proximal subsegmental pulmonary artery the right lung lower lobe. No right heart strain no lung infarction. -continue IV heparin with eventual transition to oral anticoagulation  -this is her first thromboembolic  event  -appreciate oncology recommend on length of treatment  Hypertension associated with type 2 diabetes mellitus (HCC) -Continue Losartan  Diabetes mellitus (HCC) - No recent A1c.  Last one a year ago was around 6.9. -no hyperglycemia on presentation   Malignant neoplasm of overlapping sites of left breast in female, estrogen receptor positive (HCC) - S/p left lumpectomy, radiation, on current antiestrogen therapy with letrozole - Follows with primary oncologist Dr. Pamelia Hoit - Recently had MRI mammogram on 06/2023 that was benign      Advance Care Planning: Full  Consults: oncology  Family Communication: Discussed with several brothers, mother and other extended family members at bedside.   Severity of Illness: The appropriate patient status for this patient is INPATIENT. Inpatient status is judged to be reasonable and necessary in order to provide the required intensity of service to ensure the patient's safety. The patient's presenting symptoms, physical exam findings, and initial radiographic and laboratory data in the context of their chronic comorbidities is felt to place them at high risk for further clinical deterioration. Furthermore, it is not anticipated that the patient will be medically stable for discharge from the hospital within 2 midnights of admission.   * I certify that at the point of admission it is my clinical judgment that the patient will require inpatient hospital care spanning beyond 2 midnights from the point of admission due to high intensity of service, high risk for further deterioration and high frequency of surveillance required.*  Author: Anselm Jungling, DO 10/29/2023 9:46 PM  For on call review [email or web address].

## 2023-10-29 NOTE — Progress Notes (Signed)
PHARMACY - ANTICOAGULATION CONSULT NOTE  Pharmacy Consult for heparin Indication: pulmonary embolus  No Known Allergies  Patient Measurements: Height: 5' 1.5" (156.2 cm) Weight: 89.4 kg (197 lb) IBW/kg (Calculated) : 48.95 Heparin Dosing Weight: 69.6kg  Vital Signs: Temp: 99 F (37.2 C) (10/31 1400) Temp Source: Oral (10/31 1236) BP: 126/80 (10/31 1515) Pulse Rate: 80 (10/31 1515)  Labs: Recent Labs    10/29/23 1234 10/29/23 1508  HGB 13.1  --   HCT 40.3  --   PLT 367  --   CREATININE 0.61  --   TROPONINIHS 5 5    Estimated Creatinine Clearance: 76 mL/min (by C-G formula based on SCr of 0.61 mg/dL).   Medical History: Past Medical History:  Diagnosis Date   Allergy    Back pain    Cancer (HCC) 2019   left breast cancer-last radiation Dec.2019   Diabetes mellitus without complication (HCC)    Medical history non-contributory    Personal history of radiation therapy    Prediabetes    Tiredness    Vitamin D deficiency    Assessment: 62YOF with PMH of DM and HLD presented with chest tightness and shortness of breath. Pharmacy consulted to dose heparin for PE.  Hgb 13.1, Plt 367 wnl No signs/symptoms of bleeding noted  Goal of Therapy:  Heparin level 0.3-0.7 units/ml Monitor platelets by anticoagulation protocol: Yes   Plan:  Give 5000 units heparin bolus x 1 Start heparin infusion at 1150 units/hr Check anti-Xa level in 6 hours and daily while on heparin Continue to monitor CBC and signs/symptoms of bleeding  Stephenie Acres, PharmD PGY1 Pharmacy Resident 10/29/2023 3:49 PM

## 2023-10-29 NOTE — Assessment & Plan Note (Addendum)
-   No recent A1c.  Last one a year ago was around 6.9. -no hyperglycemia on presentation

## 2023-10-29 NOTE — Assessment & Plan Note (Signed)
 Continue Losartan.

## 2023-10-29 NOTE — ED Triage Notes (Signed)
Was seen at UC 2 days ago for chest tightness that started 3 weeks ago . Had chest xray done  Pt describes her pain as a discomfort , denies NV .  Shortness of breath also started 3 weeks ago .  Her for CT .

## 2023-10-29 NOTE — ED Provider Notes (Signed)
Signout from Dr. Earlene Plater.  62 year old female with history of breast cancer in remission here with 3 weeks of chest and upper abdominal pain.  CT imaging concerning for malignancy.  Preliminary read showing PE, pancreatic mass with possible hepatic mets. Physical Exam  BP 126/80   Pulse 80   Temp 99 F (37.2 C)   Resp (!) 29   Ht 5' 1.5" (1.562 m)   Wt 89.4 kg   LMP 03/12/2015   SpO2 97%   BMI 36.62 kg/m   Physical Exam  Procedures  Procedures  ED Course / MDM   Clinical Course as of 10/29/23 1527  Thu Oct 29, 2023  1526 Radiology: small PE RLL, distal segmental, no obvious RH strain, lungs okay, primary pancreatic tumor with liver mets [JD]    Clinical Course User Index [JD] Laurence Spates, MD   Medical Decision Making Amount and/or Complexity of Data Reviewed Labs: ordered. Radiology: ordered.  Risk Prescription drug management. Decision regarding hospitalization.   1530 - Patient being started on heparin.  I have placed a call into the hospitalist for admission.  I also secure messaged her oncologist Dr. Pamelia Hoit who said he would see the patient when she gets to [facility].  Discussed with [facility] hospitalist Dr. Blake Divine who will put the patient in for a bed at [facility].     Terrilee Files, MD 10/29/23 [phone]

## 2023-10-29 NOTE — ED Notes (Signed)
Family at bedside. 

## 2023-10-29 NOTE — Assessment & Plan Note (Signed)
BMI of 36 

## 2023-10-29 NOTE — Assessment & Plan Note (Addendum)
-  provoked in the setting of new malignancy -There is a small volume occlusive thrombus in the distal segmental/proximal subsegmental pulmonary artery the right lung lower lobe. No right heart strain no lung infarction. -continue IV heparin with eventual transition to oral anticoagulation  -this is her first thromboembolic event  -appreciate oncology recommend on length of treatment

## 2023-10-29 NOTE — Assessment & Plan Note (Signed)
Liver lesions  -on CT abd/pelvis there is an ill-defined 2.0 x 2.9 cm hypoattenuating lesion in the pancreatic neck/proximal body which is favored neoplastic. There are multiple ill-defined liver masses, compatible with metastases.There is short segment complete occlusion versus marked narrowing of the upper most portion of superior mesenteric vein at the level of formation of portal vein. -oncology Dr. Pamelia Hoit who is her primary was consulted  -PRN pain control with Tylenol and Tramadol. Pt was to avoid trying other types of narcotics if possible.

## 2023-10-29 NOTE — ED Notes (Signed)
Pt reports she would prefer to not take any narcotic or any other pain medication at this time, reports pain is more "discomfort"

## 2023-10-29 NOTE — ED Provider Notes (Signed)
[facility] Provider Note   CSN: [id number] Arrival date & time: 10/29/23  1225     History  Chief Complaint  Patient presents with   Chest Pain    Milanni Ayub Yerian is a 62 y.o. female.   Chest Pain 62 year old female history of breast cancer in remission on letrozole, diabetes, former smoker presenting for chest pain.  Has been going on for about 3 weeks.  Persistent.  No clear worsening factors.  She has had early satiety and poor appetite.  She was seen urgent care few days ago had an unremarkable x-ray and was recommended come here.  She waited until today because her symptoms were persistent.  No nausea or vomiting.  She feels like her lower esophagus is blocked.  Denies any abdominal pain.  No fevers or cough.  No leg swelling or recent travel or history of DVT or PE.  No history of ACS.  Quit smoking about 10 years ago.     Home Medications Prior to Admission medications   Medication Sig Start Date End Date Taking? Authorizing Provider  atorvastatin (LIPITOR) 10 MG tablet Take 1 tablet (10 mg total) by mouth daily. Patient not taking: Reported on 08/19/2023 08/11/23     calcium citrate-vitamin D (CITRACAL+D) 315-200 MG-UNIT tablet Take 1 tablet by mouth 2 (two) times daily.    [provider]  cetirizine (ZYRTEC) 10 MG chewable tablet Chew 10 mg by mouth daily.    [provider]  COVID-19 mRNA bivalent vaccine, Pfizer, (PFIZER COVID-19 VAC BIVALENT) injection Inject into the muscle. Patient not taking: Reported on 07/22/2023 01/10/22     ibuprofen (ADVIL,MOTRIN) 800 MG tablet Take 1 tablet (800 mg total) by mouth every 8 (eight) hours as needed. 09/29/18   Cornett, Maisie Fus, MD  influenza vac split quadrivalent PF (FLUARIX) 0.5 ML injection Inject 0.5 mLs into the muscle. Patient not taking: Reported on 07/22/2023 10/03/22   Judyann Munson, MD  letrozole [facility]) 2.5 MG tablet Take 1 tablet (2.5 mg total) by mouth  daily. 12/02/22   [name], MD  losartan (COZAAR) 25 MG tablet Take 1 tablet (25 mg total) by mouth daily for kidney protection. 06/26/22     losartan (COZAAR) 25 MG tablet Take 1 tablet (25 mg total) by mouth daily for kidney protection 02/06/23     Vitamin D, Ergocalciferol, (DRISDOL) 1.25 MG (50000 UNIT) CAPS capsule Take 1 capsule (50,000 Units total) by mouth every 7 (seven) days. Patient not taking: Reported on 07/22/2023 09/04/22   Thomasene Lot, DO      Allergies    Patient has no known allergies.    Review of Systems   Review of Systems  Cardiovascular:  Positive for chest pain.  Review of systems completed and notable as per HPI.  ROS otherwise negative.   Physical Exam Updated Vital Signs BP 113/71 (BP Location: Left Arm)   Pulse 78   Temp 98.7 F (37.1 C) (Oral)   Resp 17   Ht 5' 1.5" (1.562 m)   Wt 89.4 kg   LMP 03/12/2015   SpO2 98%   BMI 36.62 kg/m  Physical Exam Vitals and nursing note reviewed.  Constitutional:      General: She is not in acute distress.    Appearance: She is well-developed.  HENT:     Head: Normocephalic and atraumatic.     Nose: Nose normal.     Mouth/Throat:     Mouth: Mucous membranes are moist.  Pharynx: Oropharynx is clear.  Eyes:     Extraocular Movements: Extraocular movements intact.     Conjunctiva/sclera: Conjunctivae normal.     Pupils: Pupils are equal, round, and reactive to light.  Cardiovascular:     Rate and Rhythm: Normal rate and regular rhythm.     Pulses: Normal pulses.     Heart sounds: Normal heart sounds. No murmur heard. Pulmonary:     Effort: Pulmonary effort is normal. No respiratory distress.     Breath sounds: Normal breath sounds.  Abdominal:     Palpations: Abdomen is soft.     Tenderness: There is abdominal tenderness. There is no guarding or rebound.     Comments: Epigastric tenderness.  Musculoskeletal:        General: No swelling.     Cervical back: Neck supple.     Right lower leg: No  edema.     Left lower leg: No edema.  Skin:    General: Skin is warm and dry.     Capillary Refill: Capillary refill takes less than 2 seconds.  Neurological:     General: No focal deficit present.     Mental Status: She is alert and oriented to person, place, and time. Mental status is at baseline.  Psychiatric:        Mood and Affect: Mood normal.     ED Results / Procedures / Treatments   Labs (all labs ordered are listed, but only abnormal results are displayed) Labs Reviewed  BASIC METABOLIC PANEL - Abnormal; Notable for the following components:      Result Value   Sodium 130 (*)    Chloride 93 (*)    Glucose, Bld 125 (*)    BUN 6 (*)    Calcium 8.8 (*)    All other components within normal limits  CBC - Abnormal; Notable for the following components:   RBC 5.12 (*)    MCV 78.7 (*)    MCH 25.6 (*)    All other components within normal limits  LIPASE, BLOOD - Abnormal; Notable for the following components:   Lipase 59 (*)    All other components within normal limits  HEPATIC FUNCTION PANEL - Abnormal; Notable for the following components:   Alkaline Phosphatase 216 (*)    All other components within normal limits  HEPARIN LEVEL (UNFRACTIONATED) - Abnormal; Notable for the following components:   Heparin Unfractionated 0.17 (*)    All other components within normal limits  CBC  BASIC METABOLIC PANEL  HIV ANTIBODY (ROUTINE TESTING W REFLEX)  HEPARIN LEVEL (UNFRACTIONATED)  TROPONIN I (HIGH SENSITIVITY)  TROPONIN I (HIGH SENSITIVITY)    EKG EKG Interpretation Date/Time:  Thursday October 29 2023 12:32:35 EDT Ventricular Rate:  99 PR Interval:  161 QRS Duration:  77 QT Interval:  336 QTC Calculation: 432 R Axis:   44  Text Interpretation: Sinus rhythm Probable left atrial enlargement Borderline T abnormalities, anterior leads No significant change since last tracing Confirmed by Fulton Reek [phone]) on 10/29/2023 12:39:28 PM  Radiology CT Angio Chest PE  W/Cm &/Or Wo Cm  Result Date: 10/29/2023 CORONARY ARTERIES: Pulmonary embolism (PE) suspected, high prob Lower chest/epigastric pain, SOB, hx breast cancer; Abdominal pain, acute, nonlocalized Lower chest/epigastric pain, SOB, hx breast cancer EXAM: CT ANGIOGRAPHY CHEST CT ABDOMEN AND PELVIS WITH CONTRAST TECHNIQUE: Multidetector CT imaging of the chest was performed using the standard protocol during bolus administration of intravenous contrast. Multiplanar CT image reconstructions and MIPs were obtained to evaluate the  vascular anatomy. Multidetector CT imaging of the abdomen and pelvis was performed using the standard protocol during bolus administration of intravenous contrast. RADIATION DOSE REDUCTION: This exam was performed according to the departmental dose-optimization program which includes automated exposure control, adjustment of the mA and/or kV according to patient size and/or use of iterative reconstruction technique. CONTRAST:  OMNIPAQUE IOHEXOL 350 MG/ML SOLN COMPARISON:  None Available. FINDINGS: CTA CHEST FINDINGS Cardiovascular: There is a small volume occlusive thrombus in the distal segmental/proximal subsegmental pulmonary artery branch to the right lower lobe. No evidence of right heart strain no lung infarction. Mild cardiomegaly. No pericardial effusion. No aortic aneurysm. There is dilation of the main pulmonary trunk measuring up to 3.1 cm, which is nonspecific but can be seen with pulmonary artery hypertension. Mediastinum/Nodes: Visualized thyroid gland appears grossly unremarkable. No solid / cystic mediastinal masses. The esophagus is nondistended precluding optimal assessment. No axillary, mediastinal or hilar lymphadenopathy by size criteria. Lungs/Pleura: The central tracheo-bronchial tree is patent. There are dependent changes and patchy areas of linear, plate-like atelectasis and/or scarring throughout bilateral lungs. No mass or consolidation. No pleural effusion or  pneumothorax. No suspicious lung nodules. Musculoskeletal: The visualized soft tissues of the chest wall are grossly unremarkable. No suspicious osseous lesions. There are mild multilevel degenerative changes in the visualized spine. Review of the MIP images confirms the above findings. CT ABDOMEN and PELVIS FINDINGS Hepatobiliary: The liver is normal in size. Non-cirrhotic configuration. There are multiple ill-defined hypoattenuating liver lesions occupying 10-20% of the total surface of liver parenchyma, compatible with metastases. The largest such lesion is in the caudate lobe measuring up to 2.8 x 3.8 cm. No intrahepatic or extrahepatic bile duct dilation. No calcified gallstones. Normal gallbladder wall thickness. No pericholecystic inflammatory changes. Pancreas: There is an ill-defined approximately 2.0 x 2.9 cm hypoattenuating lesion centered in the pancreatic neck/proximal body region. There is subtle prominence of upstream main pancreatic duct. Pancreas is otherwise essentially unremarkable. No peripancreatic fat stranding. Spleen: Within normal limits. No focal lesion. Adrenals/Urinary Tract: Adrenal glands are unremarkable. No suspicious renal mass. No hydronephrosis. No renal or ureteric calculi. Unremarkable urinary bladder. Stomach/Bowel: No disproportionate dilation of the small or large bowel loops. No evidence of abnormal bowel wall thickening or inflammatory changes. The appendix is unremarkable. Vascular/Lymphatic: No ascites or pneumoperitoneum. No abdominal or pelvic lymphadenopathy, by size criteria. No aneurysmal dilation of the major abdominal arteries. 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 Reproductive: Normal-sized retroverted uterus, not well evaluated on this exam. No large adnexal mass seen. Other: There is a tiny fat containing umbilical hernia. The soft tissues  and abdominal wall are otherwise unremarkable. Musculoskeletal: No suspicious osseous lesions. There are mild multilevel degenerative changes in the visualized spine. Review of the MIP images confirms the above findings. IMPRESSION: 1. There is a small volume occlusive thrombus in the distal segmental/proximal subsegmental pulmonary artery the right lung lower lobe. No right heart strain no lung infarction. 2. No lung mass, consolidation, pleural effusion or pneumothorax. 3. There are multiple ill-defined liver masses, compatible with metastases. 4. There is an ill-defined 2.0 x 2.9 cm hypoattenuating lesion in the pancreatic neck/proximal body which is favored neoplastic. Differential diagnosis includes primary pancreatic tumor with liver metastases versus metastatic pancreatic and liver lesions in patient with known history of breast carcinoma, less likely. Correlate with tumor markers and tissue sampling. 5. There is short segment  complete occlusion versus marked narrowing of the upper most portion of superior mesenteric vein at the level of formation of portal vein. 6. Multiple other nonacute observations, as described above. Critical Value/emergent results were called by telephone at the time of interpretation on 10/29/2023 at 3:31 pm to provider [facility] Alis Sawchuk , who verbally acknowledged these results. Electronically Signed   By: Jules Schick M.D.   On: 10/29/2023 15:39   CT ABDOMEN PELVIS W CONTRAST  Result Date: 10/29/2023 CORONARY ARTERIES: Pulmonary embolism (PE) suspected, high prob Lower chest/epigastric pain, SOB, hx breast cancer; Abdominal pain, acute, nonlocalized Lower chest/epigastric pain, SOB, hx breast cancer EXAM: CT ANGIOGRAPHY CHEST CT ABDOMEN AND PELVIS WITH CONTRAST TECHNIQUE: Multidetector CT imaging of the chest was performed using the standard protocol during bolus administration of intravenous contrast. Multiplanar CT image reconstructions and MIPs were obtained to evaluate the  vascular anatomy. Multidetector CT imaging of the abdomen and pelvis was performed using the standard protocol during bolus administration of intravenous contrast. RADIATION DOSE REDUCTION: This exam was performed according to the departmental dose-optimization program which includes automated exposure control, adjustment of the mA and/or kV according to patient size and/or use of iterative reconstruction technique. CONTRAST:  OMNIPAQUE IOHEXOL 350 MG/ML SOLN COMPARISON:  None Available. FINDINGS: CTA CHEST FINDINGS Cardiovascular: There is a small volume occlusive thrombus in the distal segmental/proximal subsegmental pulmonary artery branch to the right lower lobe. No evidence of right heart strain no lung infarction. Mild cardiomegaly. No pericardial effusion. No aortic aneurysm. There is dilation of the main pulmonary trunk measuring up to 3.1 cm, which is nonspecific but can be seen with pulmonary artery hypertension. Mediastinum/Nodes: Visualized thyroid gland appears grossly unremarkable. No solid / cystic mediastinal masses. The esophagus is nondistended precluding optimal assessment. No axillary, mediastinal or hilar lymphadenopathy by size criteria. Lungs/Pleura: The central tracheo-bronchial tree is patent. There are dependent changes and patchy areas of linear, plate-like atelectasis and/or scarring throughout bilateral lungs. No mass or consolidation. No pleural effusion or pneumothorax. No suspicious lung nodules. Musculoskeletal: The visualized soft tissues of the chest wall are grossly unremarkable. No suspicious osseous lesions. There are mild multilevel degenerative changes in the visualized spine. Review of the MIP images confirms the above findings. CT ABDOMEN and PELVIS FINDINGS Hepatobiliary: The liver is normal in size. Non-cirrhotic configuration. There are multiple ill-defined hypoattenuating liver lesions occupying 10-20% of the total surface of liver parenchyma, compatible with  metastases. The largest such lesion is in the caudate lobe measuring up to 2.8 x 3.8 cm. No intrahepatic or extrahepatic bile duct dilation. No calcified gallstones. Normal gallbladder wall thickness. No pericholecystic inflammatory changes. Pancreas: There is an ill-defined approximately 2.0 x 2.9 cm hypoattenuating lesion centered in the pancreatic neck/proximal body region. There is subtle prominence of upstream main pancreatic duct. Pancreas is otherwise essentially unremarkable. No peripancreatic fat stranding. Spleen: Within normal limits. No focal lesion. Adrenals/Urinary Tract: Adrenal glands are unremarkable. No suspicious renal mass. No hydronephrosis. No renal or ureteric calculi. Unremarkable urinary bladder. Stomach/Bowel: No disproportionate dilation of the small or large bowel loops. No evidence of abnormal bowel wall thickening or inflammatory changes. The appendix is unremarkable. Vascular/Lymphatic: No ascites or pneumoperitoneum. No abdominal or pelvic lymphadenopathy, by size criteria. No aneurysmal dilation of the major abdominal arteries. 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 Reproductive: Normal-sized retroverted uterus, not well evaluated on this exam. No large adnexal  mass seen. Other: There is a tiny fat containing umbilical hernia. The soft tissues and abdominal wall are otherwise unremarkable. Musculoskeletal: No suspicious osseous lesions. There are mild multilevel degenerative changes in the visualized spine. Review of the MIP images confirms the above findings. IMPRESSION: 1. There is a small volume occlusive thrombus in the distal segmental/proximal subsegmental pulmonary artery the right lung lower lobe. No right heart strain no lung infarction. 2. No lung mass, consolidation, pleural effusion or pneumothorax. 3. There are multiple ill-defined liver masses,  compatible with metastases. 4. There is an ill-defined 2.0 x 2.9 cm hypoattenuating lesion in the pancreatic neck/proximal body which is favored neoplastic. Differential diagnosis includes primary pancreatic tumor with liver metastases versus metastatic pancreatic and liver lesions in patient with known history of breast carcinoma, less likely. Correlate with tumor markers and tissue sampling. 5. 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 6. Multiple other nonacute observations, as described above. Critical Value/emergent results were called by telephone at the time of interpretation on 10/29/2023 at 3:31 pm to provider [facility] Tucker Steedley , who verbally acknowledged these results. Electronically Signed   By: Jules Schick M.D.   On: 10/29/2023 15:39    Procedures Procedures    Medications Ordered in ED Medications  heparin ADULT infusion 100 units/mL (25000 units/265mL) (1,400 Units/hr Intravenous New Bag/Given 10/30/23 0515)  heparin 5000 UNIT/ML injection (  Not Given 10/29/23 1634)  acetaminophen (TYLENOL) tablet 650 mg (has no administration in time range)  traMADol (ULTRAM) tablet 50 mg (has no administration in time range)  letrozole [facility]) tablet 2.5 mg (has no administration in time range)  losartan (COZAAR) tablet 25 mg (has no administration in time range)  loratadine (CLARITIN) tablet 10 mg (has no administration in time range)  iohexol (OMNIPAQUE) 350 MG/ML injection 100 mL (100 mLs Intravenous Contrast Given 10/29/23 1428)  heparin bolus via infusion 5,000 Units (5,000 Units Intravenous Bolus from Bag 10/29/23 1623)  hydrOXYzine (ATARAX) tablet 25 mg (25 mg Oral Given 10/29/23 2147)  sodium chloride 0.9 % bolus 500 mL (0 mLs Intravenous Stopped 10/30/23 0015)  heparin bolus via infusion 2,000 Units (2,000 Units Intravenous Bolus from Bag 10/30/23 0022)    ED Course/  Medical Decision Making/ A&P Clinical Course as of 10/30/23 0759  Thu Oct 29, 2023  1526 Radiology: small PE RLL, distal segmental, no obvious RH strain, lungs okay, primary pancreatic tumor with liver mets [JD]    Clinical Course User Index [JD] Laurence Spates, MD                                 Medical Decision Making Amount and/or Complexity of Data Reviewed Labs: ordered. Radiology: ordered.  Risk Prescription drug management. Decision regarding hospitalization.   Medical Decision Making:   CORYN MOSSO is a 62 y.o. female who presented to the ED today with chest pain, shortness of breath, early satiety.  Vital signs reviewed.  Exam she is overall well-appearing.  She is clear lungs, but has significant epigastric tenderness.  Her pain is right at the lower aspect of her chest versus upper abdomen.  Differential bleeding ACS, PE, gastritis, peptic ulcer disease, pancreatitis, biliary pathology.  Seems less likely obstruction.  Low suspicion for dissection.  Will plan to obtain CT of the chest and abdomen to evaluate as well as lab  workup.  EKG shows some nonspecific anterior T wave abnormalities although no significant change from prior.  Hear score of 3.   Patient placed on continuous vitals and telemetry monitoring while in ED which was reviewed periodically.  Reviewed and confirmed nursing documentation for past medical history, family history, social history.   Reassessment and Plan:   Lab work reviewed notable for mild alk phos elevation as well as mild lipase elevation.  Also has sodium 130.  Pain somewhat improved on reassessment.  Troponin is negative, lower concern for ACS.  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  This was discussed with the patient.  Final reads are pending, handoff was given to Dr. Charm Barges with plan to discuss with oncology  and admit for further management.  Heparin drip ordered.   Patient's presentation is most consistent with acute complicated illness / injury requiring diagnostic workup.           Final Clinical Impression(s) / ED Diagnoses Final diagnoses:  Mass of pancreas  Acute pulmonary embolism without acute cor pulmonale, unspecified pulmonary embolism type [name])    Rx / DC Orders ED Discharge Orders     None         Laurence Spates, MD 10/30/23 [phone]

## 2023-10-29 NOTE — Assessment & Plan Note (Signed)
-   S/p left lumpectomy, radiation, on current antiestrogen therapy with letrozole - Follows with primary oncologist Dr. Pamelia Hoit - Recently had MRI mammogram on 06/2023 that was benign

## 2023-10-30 ENCOUNTER — Inpatient Hospital Stay (HOSPITAL_COMMUNITY): Payer: 59

## 2023-10-30 DIAGNOSIS — I2693 Single subsegmental pulmonary embolism without acute cor pulmonale: Secondary | ICD-10-CM | POA: Diagnosis not present

## 2023-10-30 DIAGNOSIS — E1159 Type 2 diabetes mellitus with other circulatory complications: Secondary | ICD-10-CM | POA: Diagnosis not present

## 2023-10-30 DIAGNOSIS — Z86711 Personal history of pulmonary embolism: Secondary | ICD-10-CM

## 2023-10-30 DIAGNOSIS — K8689 Other specified diseases of pancreas: Secondary | ICD-10-CM | POA: Diagnosis not present

## 2023-10-30 DIAGNOSIS — I2699 Other pulmonary embolism without acute cor pulmonale: Secondary | ICD-10-CM | POA: Diagnosis not present

## 2023-10-30 DIAGNOSIS — K769 Liver disease, unspecified: Secondary | ICD-10-CM | POA: Diagnosis not present

## 2023-10-30 LAB — BASIC METABOLIC PANEL
Anion gap: 13 (ref 5–15)
BUN: 6 mg/dL — ABNORMAL LOW (ref 8–23)
CO2: 24 mmol/L (ref 22–32)
Calcium: 8.7 mg/dL — ABNORMAL LOW (ref 8.9–10.3)
Chloride: 97 mmol/L — ABNORMAL LOW (ref 98–111)
Creatinine, Ser: 0.31 mg/dL — ABNORMAL LOW (ref 0.44–1.00)
GFR, Estimated: >60 mL/min (ref >60)
Glucose, Bld: 129 mg/dL — ABNORMAL HIGH (ref 70–99)
Potassium: 3.7 mmol/L (ref 3.5–5.1)
Sodium: 134 mmol/L — ABNORMAL LOW (ref 135–145)

## 2023-10-30 LAB — HEPARIN LEVEL (UNFRACTIONATED)
Heparin Unfractionated: 0.29 [IU]/mL — ABNORMAL LOW (ref 0.30–0.70)
Heparin Unfractionated: 0.23 [IU]/mL — ABNORMAL LOW (ref 0.30–0.70)
Heparin Unfractionated: 0.22 [IU]/mL — ABNORMAL LOW (ref 0.30–0.70)

## 2023-10-30 LAB — HIV ANTIBODY (ROUTINE TESTING W REFLEX): HIV Screen 4th Generation wRfx: NONREACTIVE

## 2023-10-30 LAB — CBC
HCT: 37.7 % (ref 36.0–46.0)
Hemoglobin: 12.2 g/dL (ref 12.0–15.0)
MCH: 25.9 pg — ABNORMAL LOW (ref 26.0–34.0)
MCHC: 32.4 g/dL (ref 30.0–36.0)
MCV: 80 fL (ref 80.0–100.0)
Platelets: 318 10*3/uL (ref 150–400)
RBC: 4.71 MIL/uL (ref 3.87–5.11)
RDW: 14 % (ref 11.5–15.5)
WBC: 7.9 10*3/uL (ref 4.0–10.5)
nRBC: 0 % (ref 0.0–0.2)

## 2023-10-30 LAB — ECHOCARDIOGRAM COMPLETE
Area-P 1/2: 2.75 cm2
Height: 61.5 in
S' Lateral: 2.6 cm
Weight: 3152 [oz_av]

## 2023-10-30 MED ORDER — HEPARIN (PORCINE) 25000 UT/250ML-% IV SOLN
1650.0000 [IU]/h | INTRAVENOUS | Status: AC
  Administered 2023-10-30 – 2023-11-01 (×5): 1650 [IU]/h via INTRAVENOUS
  Filled 2023-10-30 (×4): qty 250

## 2023-10-30 MED ORDER — HEPARIN BOLUS VIA INFUSION
1000.0000 [IU] | Freq: Once | INTRAVENOUS | Status: AC
  Administered 2023-10-30: 1000 [IU] via INTRAVENOUS
  Filled 2023-10-30: qty 1000

## 2023-10-30 MED ORDER — PERFLUTREN LIPID MICROSPHERE
1.0000 mL | INTRAVENOUS | Status: AC | PRN
  Administered 2023-10-30: 3 mL via INTRAVENOUS

## 2023-10-30 MED ORDER — HEPARIN BOLUS VIA INFUSION
2000.0000 [IU] | Freq: Once | INTRAVENOUS | Status: AC
  Administered 2023-10-30: 2000 [IU] via INTRAVENOUS
  Filled 2023-10-30: qty 2000

## 2023-10-30 NOTE — Progress Notes (Signed)
PHARMACY - ANTICOAGULATION CONSULT NOTE  Pharmacy Consult for heparin Indication: pulmonary embolus  No Known Allergies  Patient Measurements: Height: 5' 1.5" (156.2 cm) Weight: 89.4 kg (197 lb) IBW/kg (Calculated) : 48.95 Heparin Dosing Weight: 69.6kg  Vital Signs: Temp: 98.2 F (36.8 C) (10/31 2202) Temp Source: Oral (10/31 2202) BP: 115/73 (10/31 2202) Pulse Rate: 89 (10/31 2202)  Labs: Recent Labs    10/29/23 1234 10/29/23 1508 10/29/23 2233  HGB 13.1  --   --   HCT 40.3  --   --   PLT 367  --   --   HEPARINUNFRC  --   --  0.17*  CREATININE 0.61  --   --   TROPONINIHS 5 5  --     Estimated Creatinine Clearance: 76 mL/min (by C-G formula based on SCr of 0.61 mg/dL).   Medical History: Past Medical History:  Diagnosis Date   Allergy    Back pain    Cancer (HCC) 2019   left breast cancer-last radiation Dec.2019   Diabetes mellitus without complication (HCC)    Medical history non-contributory    Personal history of radiation therapy    Prediabetes    Tiredness    Vitamin D deficiency    Assessment: 62YOF with PMH of DM and HLD presented with chest tightness and shortness of breath. Pharmacy consulted to dose heparin for PE.   HL 0.17 subtherapeutic on 1150 units/hr No bleeding noted per RN  Goal of Therapy:  Heparin level 0.3-0.7 units/ml Monitor platelets by anticoagulation protocol: Yes   Plan:  Give 2000 units heparin bolus x 1 Increase heparin drip to 1400 units/hr Check anti-Xa level in 6 hours and daily while on heparin Continue to monitor CBC and signs/symptoms of bleeding  Arley Phenix RPh 10/30/2023, 12:17 AM

## 2023-10-30 NOTE — Progress Notes (Signed)
   10/30/23 1156  TOC Brief Assessment  Insurance and Status Reviewed  Patient has primary care physician Yes  Home environment has been reviewed home with family support  Prior level of function: independent  Prior/Current Home Services No current home services  Social Determinants of Health Reivew SDOH reviewed no interventions necessary  Readmission risk has been reviewed Yes  Transition of care needs no transition of care needs at this time

## 2023-10-30 NOTE — Progress Notes (Incomplete)
[facility]                                                                               Ashley Clark, is a 62 y.o. female, DOB - [birth year], ION:[id number] Admit date - 10/29/2023    Outpatient Primary MD for the patient is [name], [name], PA  LOS - 1  days    Brief summary   No notes on file   Assessment & Plan    Assessment and Plan: * Pancreatic mass Liver lesions  -on CT abd/pelvis there is an ill-defined 2.0 x 2.9 cm hypoattenuating lesion in the pancreatic neck/proximal body which is favored neoplastic. There are multiple ill-defined liver masses, compatible with metastases.There is short segment complete occlusion versus marked narrowing of the upper most portion of superior mesenteric vein at the level of formation of portal vein. -oncology Dr. Pamelia Hoit who is her primary was consulted  -PRN pain control with Tylenol and Tramadol. Pt was to avoid trying other types of narcotics if possible.     Obesity (BMI 30-39.9) BMI of 36  Acute pulmonary embolism (HCC) -provoked in the setting of new malignancy -There is a small volume occlusive thrombus in the distal segmental/proximal subsegmental pulmonary artery the right lung lower lobe. No right heart strain no lung infarction. -continue IV heparin with eventual transition to oral anticoagulation  -this is her first thromboembolic event  -appreciate oncology recommend on length of treatment  Hypertension associated with type 2 diabetes mellitus (HCC) -Continue Losartan  Diabetes mellitus (HCC) - No recent A1c.  Last one a year ago was around 6.9. -no hyperglycemia on presentation   Malignant neoplasm of overlapping sites of left breast in female, estrogen receptor positive (HCC) - S/p left lumpectomy, radiation, on current antiestrogen therapy with letrozole - Follows with primary oncologist Dr. Pamelia Hoit - Recently had MRI mammogram on 06/2023 that was benign         RN Pressure Injury  Documentation:    Malnutrition Type:      Malnutrition Characteristics:      Nutrition Interventions:     Estimated body mass index is 36.62 kg/m as calculated from the following:   Height as of this encounter: 5' 1.5" (1.562 m).   Weight as of this encounter: 89.4 kg.  Code Status: *** DVT Prophylaxis:  heparin 5000 UNIT/ML injection Start: 10/29/23 1617   Level of Care: Level of care: Telemetry Family Communication: Updated patient's ***  Disposition Plan:     Remains inpatient appropriate:  ***   Procedures:  ***  Consultants:   ***  Antimicrobials:   Anti-infectives (From admission, onward)    None        Medications  Scheduled Meds:  loratadine  10 mg Oral Daily   losartan  25 mg Oral Daily   Continuous Infusions:  heparin 1,500 Units/hr (10/30/23 0951)   PRN Meds:.acetaminophen, traMADol    Subjective:   Ashley Clark was seen and examined today.  *** Patient denies dizziness, chest pain, shortness of breath, abdominal pain, N/V/D/C, new weakness, numbess, tingling. No acute events overnight.    Objective:   Vitals:   10/30/23 0129 10/30/23 0547 10/30/23 1000 10/30/23 1338  BP: 109/70 113/71 110/69 116/75  Pulse: 78 78 74 79  Resp: 16 17  18   Temp: 100.2 F (37.9 C) 98.7 F (37.1 C) 98 F (36.7 C) 98.6 F (37 C)  TempSrc: Oral Oral Oral Oral  SpO2: 100% 98% 100% 98%  Weight:      Height:        Intake/Output Summary (Last 24 hours) at 10/30/2023 1407 Last data filed at 10/30/2023 1000 Gross per 24 hour  Intake 847.6 ml  Output 300 ml  Net 547.6 ml   Filed Weights   10/29/23 1231  Weight: 89.4 kg     Exam General: Alert and oriented x 3, NAD Cardiovascular: S1 S2 auscultated, no murmurs, RRR Respiratory: Clear to auscultation bilaterally, no wheezing, rales or rhonchi Gastrointestinal: Soft, nontender, nondistended, + bowel sounds Ext: no pedal edema bilaterally Neuro: AAOx3, Cr N's II- XII. Strength 5/5 upper  and lower extremities bilaterally Skin: No rashes Psych: Normal affect and demeanor, alert and oriented x3    Data Reviewed:  I have personally reviewed following labs and imaging studies   CBC Lab Results  Component Value Date   WBC 7.9 10/30/2023   RBC 4.71 10/30/2023   HGB 12.2 10/30/2023   HCT 37.7 10/30/2023   MCV 80.0 10/30/2023   MCH 25.9 (L) 10/30/2023   PLT 318 10/30/2023   MCHC 32.4 10/30/2023   RDW 14.0 10/30/2023   LYMPHSABS 1.7 09/27/2018   MONOABS 0.6 09/27/2018   EOSABS 0.1 09/27/2018   BASOSABS 0.0 09/27/2018     Last metabolic panel Lab Results  Component Value Date   NA 134 (L) 10/30/2023   K 3.7 10/30/2023   CL 97 (L) 10/30/2023   CO2 24 10/30/2023   BUN 6 (L) 10/30/2023   CREATININE 0.31 (L) 10/30/2023   GLUCOSE 129 (H) 10/30/2023   GFRNONAA >60 10/30/2023   CALCIUM 8.7 (L) 10/30/2023   PROT 7.9 10/29/2023   ALBUMIN 3.8 10/29/2023   LABGLOB 3.0 06/10/2022   AGRATIO 1.5 06/10/2022   BILITOT 0.8 10/29/2023   ALKPHOS 216 (H) 10/29/2023   AST 31 10/29/2023   ALT 20 10/29/2023   ANIONGAP 13 10/30/2023    CBG (last 3)  No results for input(s): "GLUCAP" in the last 72 hours.    Coagulation Profile: No results for input(s): "INR", "PROTIME" in the last 168 hours.   Radiology Studies: CT Angio Chest PE W/Cm &/Or Wo Cm  Result Date: 10/29/2023 CORONARY ARTERIES: Pulmonary embolism (PE) suspected, high prob Lower chest/epigastric pain, SOB, hx breast cancer; Abdominal pain, acute, nonlocalized Lower chest/epigastric pain, SOB, hx breast cancer EXAM: CT ANGIOGRAPHY CHEST CT ABDOMEN AND PELVIS WITH CONTRAST TECHNIQUE: Multidetector CT imaging of the chest was performed using the standard protocol during bolus administration of intravenous contrast. Multiplanar CT image reconstructions and MIPs were obtained to evaluate the vascular anatomy. Multidetector CT imaging of the abdomen and pelvis was performed using the standard protocol during bolus  administration of intravenous contrast. RADIATION DOSE REDUCTION: This exam was performed according to the departmental dose-optimization program which includes automated exposure control, adjustment of the mA and/or kV according to patient size and/or use of iterative reconstruction technique. CONTRAST:  OMNIPAQUE IOHEXOL 350 MG/ML SOLN COMPARISON:  None Available. FINDINGS: CTA CHEST FINDINGS Cardiovascular: There is a small volume occlusive thrombus in the distal segmental/proximal subsegmental pulmonary artery branch to the right lower lobe. No evidence of right heart strain no lung infarction. Mild cardiomegaly. No pericardial effusion. No aortic aneurysm. There is dilation  of the main pulmonary trunk measuring up to 3.1 cm, which is nonspecific but can be seen with pulmonary artery hypertension. Mediastinum/Nodes: Visualized thyroid gland appears grossly unremarkable. No solid / cystic mediastinal masses. The esophagus is nondistended precluding optimal assessment. No axillary, mediastinal or hilar lymphadenopathy by size criteria. Lungs/Pleura: The central tracheo-bronchial tree is patent. There are dependent changes and patchy areas of linear, plate-like atelectasis and/or scarring throughout bilateral lungs. No mass or consolidation. No pleural effusion or pneumothorax. No suspicious lung nodules. Musculoskeletal: The visualized soft tissues of the chest wall are grossly unremarkable. No suspicious osseous lesions. There are mild multilevel degenerative changes in the visualized spine. Review of the MIP images confirms the above findings. CT ABDOMEN and PELVIS FINDINGS Hepatobiliary: The liver is normal in size. Non-cirrhotic configuration. There are multiple ill-defined hypoattenuating liver lesions occupying 10-20% of the total surface of liver parenchyma, compatible with metastases. The largest such lesion is in the caudate lobe measuring up to 2.8 x 3.8 cm. No intrahepatic or extrahepatic bile  duct dilation. No calcified gallstones. Normal gallbladder wall thickness. No pericholecystic inflammatory changes. Pancreas: There is an ill-defined approximately 2.0 x 2.9 cm hypoattenuating lesion centered in the pancreatic neck/proximal body region. There is subtle prominence of upstream main pancreatic duct. Pancreas is otherwise essentially unremarkable. No peripancreatic fat stranding. Spleen: Within normal limits. No focal lesion. Adrenals/Urinary Tract: Adrenal glands are unremarkable. No suspicious renal mass. No hydronephrosis. No renal or ureteric calculi. Unremarkable urinary bladder. Stomach/Bowel: No disproportionate dilation of the small or large bowel loops. No evidence of abnormal bowel wall thickening or inflammatory changes. The appendix is unremarkable. Vascular/Lymphatic: No ascites or pneumoperitoneum. No abdominal or pelvic lymphadenopathy, by size criteria. No aneurysmal dilation of the major abdominal arteries. 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 Reproductive: Normal-sized retroverted uterus, not well evaluated on this exam. No large adnexal mass seen. Other: There is a tiny fat containing umbilical hernia. The soft tissues and abdominal wall are otherwise unremarkable. Musculoskeletal: No suspicious osseous lesions. There are mild multilevel degenerative changes in the visualized spine. Review of the MIP images confirms the above findings. IMPRESSION: 1. There is a small volume occlusive thrombus in the distal segmental/proximal subsegmental pulmonary artery the right lung lower lobe. No right heart strain no lung infarction. 2. No lung mass, consolidation, pleural effusion or pneumothorax. 3. There are multiple ill-defined liver masses, compatible with metastases. 4. There is an ill-defined 2.0 x 2.9 cm hypoattenuating lesion in the pancreatic neck/proximal body  which is favored neoplastic. Differential diagnosis includes primary pancreatic tumor with liver metastases versus metastatic pancreatic and liver lesions in patient with known history of breast carcinoma, less likely. Correlate with tumor markers and tissue sampling. 5. 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 6. Multiple other nonacute observations, as described above. Critical Value/emergent results were called by telephone at the time of interpretation on 10/29/2023 at 3:31 pm to provider [facility] DAVIS , who verbally acknowledged these results. Electronically Signed   By: Jules Schick M.D.   On: 10/29/2023 15:39   CT ABDOMEN PELVIS W CONTRAST  Result Date: 10/29/2023 CORONARY ARTERIES: Pulmonary embolism (PE) suspected, high prob Lower chest/epigastric pain, SOB, hx breast cancer; Abdominal pain, acute, nonlocalized Lower chest/epigastric pain, SOB, hx breast cancer EXAM: CT ANGIOGRAPHY CHEST CT  ABDOMEN AND PELVIS WITH CONTRAST TECHNIQUE: Multidetector CT imaging of the chest was performed using the standard protocol during bolus administration of intravenous contrast. Multiplanar CT image reconstructions and MIPs were obtained to evaluate the vascular anatomy. Multidetector CT imaging of the abdomen and pelvis was performed using the standard protocol during bolus administration of intravenous contrast. RADIATION DOSE REDUCTION: This exam was performed according to the departmental dose-optimization program which includes automated exposure control, adjustment of the mA and/or kV according to patient size and/or use of iterative reconstruction technique. CONTRAST:  OMNIPAQUE IOHEXOL 350 MG/ML SOLN COMPARISON:  None Available. FINDINGS: CTA CHEST FINDINGS Cardiovascular: There is a small volume occlusive thrombus in the distal segmental/proximal subsegmental pulmonary artery  branch to the right lower lobe. No evidence of right heart strain no lung infarction. Mild cardiomegaly. No pericardial effusion. No aortic aneurysm. There is dilation of the main pulmonary trunk measuring up to 3.1 cm, which is nonspecific but can be seen with pulmonary artery hypertension. Mediastinum/Nodes: Visualized thyroid gland appears grossly unremarkable. No solid / cystic mediastinal masses. The esophagus is nondistended precluding optimal assessment. No axillary, mediastinal or hilar lymphadenopathy by size criteria. Lungs/Pleura: The central tracheo-bronchial tree is patent. There are dependent changes and patchy areas of linear, plate-like atelectasis and/or scarring throughout bilateral lungs. No mass or consolidation. No pleural effusion or pneumothorax. No suspicious lung nodules. Musculoskeletal: The visualized soft tissues of the chest wall are grossly unremarkable. No suspicious osseous lesions. There are mild multilevel degenerative changes in the visualized spine. Review of the MIP images confirms the above findings. CT ABDOMEN and PELVIS FINDINGS Hepatobiliary: The liver is normal in size. Non-cirrhotic configuration. There are multiple ill-defined hypoattenuating liver lesions occupying 10-20% of the total surface of liver parenchyma, compatible with metastases. The largest such lesion is in the caudate lobe measuring up to 2.8 x 3.8 cm. No intrahepatic or extrahepatic bile duct dilation. No calcified gallstones. Normal gallbladder wall thickness. No pericholecystic inflammatory changes. Pancreas: There is an ill-defined approximately 2.0 x 2.9 cm hypoattenuating lesion centered in the pancreatic neck/proximal body region. There is subtle prominence of upstream main pancreatic duct. Pancreas is otherwise essentially unremarkable. No peripancreatic fat stranding. Spleen: Within normal limits. No focal lesion. Adrenals/Urinary Tract: Adrenal glands are unremarkable. No suspicious renal mass. No  hydronephrosis. No renal or ureteric calculi. Unremarkable urinary bladder. Stomach/Bowel: No disproportionate dilation of the small or large bowel loops. No evidence of abnormal bowel wall thickening or inflammatory changes. The appendix is unremarkable. Vascular/Lymphatic: No ascites or pneumoperitoneum. No abdominal or pelvic lymphadenopathy, by size criteria. No aneurysmal dilation of the major abdominal arteries. 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 Reproductive: Normal-sized retroverted uterus, not well evaluated on this exam. No large adnexal mass seen. Other: There is a tiny fat containing umbilical hernia. The soft tissues and abdominal wall are otherwise unremarkable. Musculoskeletal: No suspicious osseous lesions. There are mild multilevel degenerative changes in the visualized spine. Review of the MIP images confirms the above findings. IMPRESSION: 1. There is a small volume occlusive thrombus in the distal segmental/proximal subsegmental pulmonary artery the right lung lower lobe. No right heart strain no lung infarction. 2. No lung mass, consolidation, pleural effusion or pneumothorax. 3. There are multiple ill-defined liver masses, compatible with metastases. 4. There is an ill-defined 2.0 x 2.9 cm hypoattenuating lesion in the pancreatic neck/proximal body which is favored neoplastic. Differential diagnosis  includes primary pancreatic tumor with liver metastases versus metastatic pancreatic and liver lesions in patient with known history of breast carcinoma, less likely. Correlate with tumor markers and tissue sampling. 5. 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 6. Multiple other nonacute observations, as described above. Critical Value/emergent results were  called by telephone at the time of interpretation on 10/29/2023 at 3:31 pm to provider [facility] DAVIS , who verbally acknowledged these results. Electronically Signed   By: Jules Schick M.D.   On: 10/29/2023 15:39       Kathlen Mody M.D. [facility] 10/30/2023, 2:07 PM  Available via Epic secure chat 7am-7pm After 7 pm, please refer to night coverage provider listed on amion.

## 2023-10-30 NOTE — Progress Notes (Signed)
Pharmacy Brief Note - Evening Anticoagulation Follow Up:  Patient is a 54 yoF on heparin drip for acute PE. For full history, see note by Laureen Ochs, PharmD.   Assessment: Heparin level = 0.23 (confirmed with repeat of 0.22) is subtherapeutic despite increasing rate of heparin infusion to 1500 units/hr Confirmed with RN - no signs of bleeding or interruptions  Goal: Heparin level 0.3 - 0.7  Plan: Heparin bolus 1000 units IV once Increase heparin infusion to 1650 units/hr Check heparin level 6 hour after rate change CBC, heparin level daily Monitor for signs of bleeding  Cindi Carbon, PharmD 10/30/23 2:42 PM

## 2023-10-30 NOTE — Progress Notes (Signed)
HEMATOLOGY-ONCOLOGY PROGRESS NOTE  SUBJECTIVE: Patient with history of breast cancer presenting with epigastric abdominal discomfort and was found to have a pancreatic mass with liver metastases.  Oncology History  Malignant neoplasm of overlapping sites of left breast in female, estrogen receptor positive (HCC)  08/04/2018 Initial Diagnosis   Screening mammogram detected left breast mass at 9:30 position 1.2 x 1.2 x 1.1 cm.  Closer to the nipple at 9:30 position 0.8 cm lesion span 3.6 cm and a 1.5 cm apart.  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 position 2 cm from nipple, at 1 cm from nipple PASH, lymph node benign, ER 90%, PR 95%, Ki-67 20%, HER-2 negative by IHC 1+, T1CN0 stage Ia AJCC 8   09/29/2018 Surgery   Left lumpectomy: Grade 3 IDC, 1.7 cm, margins negative, lymphovascular invasion present, 0/2 lymph nodes negative, T1c N0 stage Ia   10/06/2018 Cancer Staging   Staging form: Breast, AJCC 8th Edition - Pathologic: Stage IA (pT1c, pN0(sn), cM0, G3, ER+, PR+, HER2-) - Signed by [name], MD on 10/06/2018   10/22/2018 Oncotype testing   Oncotype score 13: Distant recurrence at 9 years with hormone therapy alone 4%   11/12/2018 - 12/27/2018 Radiation Therapy   Adjuvant radiation therapy   12/27/2018 -  Anti-estrogen oral therapy   Antiestrogen therapy with letrozole 2.5 mg daily     OBJECTIVE: REVIEW OF SYSTEMS:   Epigastric discomfort otherwise all systems are negative. Recently she came back from [place] cruise and felt decreased appetite and no interest in eating.  PHYSICAL EXAMINATION: ECOG PERFORMANCE STATUS: 1 - Symptomatic but completely ambulatory  Vitals:   10/30/23 1000 10/30/23 1338  BP: 110/69 116/75  Pulse: 74 79  Resp:  18  Temp: 98 F (36.7 C) 98.6 F (37 C)  SpO2: 100% 98%   Filed Weights   10/29/23 1231  Weight: 197 lb (89.4 kg)    LABORATORY DATA:  I have reviewed  the data as listed    Latest Ref Rng & Units 10/30/2023    7:21 AM 10/29/2023   12:34 PM 06/10/2022   11:52 AM  CMP  Glucose 70 - 99 mg/dL 244  010  272   BUN 8 - 23 mg/dL 6  6  7    Creatinine 0.44 - 1.00 mg/dL 5.36  6.44  0.34   Sodium 135 - 145 mmol/L 134  130  136   Potassium 3.5 - 5.1 mmol/L 3.7  3.7  4.6   Chloride 98 - 111 mmol/L 97  93  97   CO2 22 - 32 mmol/L 24  23  23    Calcium 8.9 - 10.3 mg/dL 8.7  8.8  9.7   Total Protein 6.5 - 8.1 g/dL  7.9  7.6   Total Bilirubin 0.3 - 1.2 mg/dL  0.8  0.3   Alkaline Phos 38 - 126 U/L  216  151   AST 15 - 41 U/L  31  20   ALT 0 - 44 U/L  20  20     Lab Results  Component Value Date   WBC 7.9 10/30/2023   HGB 12.2 10/30/2023   HCT 37.7 10/30/2023   MCV 80.0 10/30/2023   PLT 318 10/30/2023   NEUTROABS 2.6 09/27/2018    ASSESSMENT AND PLAN: 1.  Pancreatic mass with liver metastases: Ill-defined 2.9 cm pancreatic neck/body lesion, 3.8 cm caudate lobe of the liver metastases as  well as multiple other ill-defined liver lesions occupying 10 to 20% of the liver parenchyma  Plan: Obtain tumor markers Liver biopsy scheduled for Monday If pancreatic cancer is confirmed we will arrange outpatient follow-up with GI oncology.  2. history of breast cancer: In remission last mammogram in July 2024: Benign

## 2023-10-30 NOTE — Progress Notes (Signed)
[facility]                                                                               Ashley Clark, is a 62 y.o. female, DOB - 1961/12/13, QMV:[id number] Admit date - 10/29/2023    Outpatient Primary MD for the patient is Ashley Clark  LOS - 1  days    Brief summary   Ashley Clark is a 62 y.o. female with medical history significant of left breast cancer diagnosed in 2019 s/p left lumpectomy, radiation, on current antiestrogen therapy, hypertension, type 2 diabetes who presented to ED with epigastric and chest pain.   CTA chest/abdomen/pelvis was obtained revealing small volume occlusive thrombus in the distal segment/proximal subsegmental pulmonary RV of the right lower lobe. No right heart strain or lung infarct. However there was a new ill-defined lesion in the pancreatic neck/proximal body favoring neoplasm. There are also ill-defined lesions in the liver concerning for metastasis.    Assessment & Plan    Assessment and Plan: * Pancreatic mass Liver lesions  -on CT abd/pelvis there is an ill-defined 2.0 x 2.9 cm hypoattenuating lesion in the pancreatic neck/proximal body which is favored neoplastic. There are multiple ill-defined liver masses, compatible with metastases. Pain control. IR consulted and plan for biopsy on Monday at 12 pm.     Obesity (BMI 30-39.9) Body mass index is 36.62 kg/m.   Acute pulmonary embolism (HCC) -provoked in the setting of new malignancy -get echo.  - check venous duplex of the lower extremities.    Hypertension associated with type 2 diabetes mellitus (HCC) Well controlled.   Diabetes mellitus (HCC) - No recent A1c.  Last one a year ago was around 6.9. -no hyperglycemia on presentation  CBG (last 3)  No results for input(s): "GLUCAP" in the last 72 hours.   Malignant neoplasm of overlapping sites of left breast in female, estrogen receptor positive (HCC) - S/p left lumpectomy, radiation, on current  antiestrogen therapy with letrozole - Follows with primary oncologist Dr. Pamelia Clark - Recently had MRI mammogram on 06/2023 that was benign     Estimated body mass index is 36.62 kg/m as calculated from the following:   Height as of this encounter: 5' 1.5" (1.562 m).   Weight as of this encounter: 89.4 kg.  Code Status: full code.  DVT Prophylaxis:  heparin 5000 UNIT/ML injection Start: 10/29/23 1617   Level of Care: Level of care: Telemetry Family Communication: family at bedside.   Disposition Plan:     Remains inpatient appropriate:  pending biopsy on Monday.   Procedures:  Echo.  Venous duplex of the lower extremities.   Consultants:   Oncology.   Antimicrobials:   Anti-infectives (From admission, onward)    None        Medications  Scheduled Meds:  loratadine  10 mg Oral Daily   Continuous Infusions:  heparin Stopped (11/02/23 0600)   PRN Meds:.acetaminophen, perflutren lipid microspheres (DEFINITY) IV suspension, traMADol    Subjective:   Ashley Clark was seen and examined today.  Mild epigastric discomfort.   Objective:   Vitals:   10/30/23 0129 10/30/23 0547 10/30/23 1000 10/30/23 1338  BP: 109/70  113/71 110/69 116/75  Pulse: 78 78 74 79  Resp: 16 17  18   Temp: 100.2 F (37.9 C) 98.7 F (37.1 C) 98 F (36.7 C) 98.6 F (37 C)  TempSrc: Oral Oral Oral Oral  SpO2: 100% 98% 100% 98%  Weight:      Height:        Intake/Output Summary (Last 24 hours) at 10/30/2023 1615 Last data filed at 10/30/2023 1400 Gross per 24 hour  Intake 1121.05 ml  Output 800 ml  Net 321.05 ml   Filed Weights   10/29/23 1231  Weight: 89.4 kg     Exam General exam: Appears calm and comfortable  Respiratory system: Clear to auscultation. Respiratory effort normal. Cardiovascular system: S1 & S2 heard, RRR. Gastrointestinal system: Abdomen is nondistended, soft and nontender.  Central nervous system: Alert and oriented.  Extremities: Symmetric 5 x 5  power. Skin: No rashes, lesions or ulcers Psychiatry: mood is appropriate.   Data Reviewed:  I have personally reviewed following labs and imaging studies   CBC Lab Results  Component Value Date   WBC 7.9 10/30/2023   RBC 4.71 10/30/2023   HGB 12.2 10/30/2023   HCT 37.7 10/30/2023   MCV 80.0 10/30/2023   MCH 25.9 (L) 10/30/2023   PLT 318 10/30/2023   MCHC 32.4 10/30/2023   RDW 14.0 10/30/2023   LYMPHSABS 1.7 09/27/2018   MONOABS 0.6 09/27/2018   EOSABS 0.1 09/27/2018   BASOSABS 0.0 09/27/2018     Last metabolic panel Lab Results  Component Value Date   NA 134 (L) 10/30/2023   K 3.7 10/30/2023   CL 97 (L) 10/30/2023   CO2 24 10/30/2023   BUN 6 (L) 10/30/2023   CREATININE 0.31 (L) 10/30/2023   GLUCOSE 129 (H) 10/30/2023   GFRNONAA >60 10/30/2023   CALCIUM 8.7 (L) 10/30/2023   PROT 7.9 10/29/2023   ALBUMIN 3.8 10/29/2023   LABGLOB 3.0 06/10/2022   AGRATIO 1.5 06/10/2022   BILITOT 0.8 10/29/2023   ALKPHOS 216 (H) 10/29/2023   AST 31 10/29/2023   ALT 20 10/29/2023   ANIONGAP 13 10/30/2023    CBG (last 3)  No results for input(s): "GLUCAP" in the last 72 hours.    Coagulation Profile: No results for input(s): "INR", "PROTIME" in the last 168 hours.   Radiology Studies: VAS [place] LOWER EXTREMITY VENOUS (DVT)  Result Date: 10/30/2023  Lower Venous DVT Study Patient Name:  Ashley Clark  Date of Exam:   10/30/2023 Medical Rec #: [id number]         Accession #:    [id number] Date of Birth: 07-16-1961         Patient Gender: F Patient Age:   69 years [facility]am [facility] Procedure:      VAS [place] LOWER EXTREMITY VENOUS (DVT) Referring Phys: Kathlen Mody --------------------------------------------------------------------------------  Indications: Pulmonary embolism.  Risk Factors: Hx of cancer, newly diagnosed pancreatic and liver lesions. Comparison Study: No previous exams Performing Technologist: Jody Hill RVT, RDMS  Examination Guidelines: A complete  evaluation includes B-mode imaging, spectral Doppler, color Doppler, and power Doppler as needed of all accessible portions of each vessel. Bilateral testing is considered an integral part of a complete examination. Limited examinations for reoccurring indications may be performed as noted. The reflux portion of the exam is performed with the patient in reverse Trendelenburg.  +---------+---------------+---------+-----------+----------+--------------+ RIGHT    CompressibilityPhasicitySpontaneityPropertiesThrombus Aging +---------+---------------+---------+-----------+----------+--------------+ CFV      Full  Yes      Yes                                 +---------+---------------+---------+-----------+----------+--------------+ SFJ      Full                                                        +---------+---------------+---------+-----------+----------+--------------+ FV Prox  Full           Yes      Yes                                 +---------+---------------+---------+-----------+----------+--------------+ FV Mid   Full           Yes      Yes                                 +---------+---------------+---------+-----------+----------+--------------+ FV DistalFull           Yes      Yes                                 +---------+---------------+---------+-----------+----------+--------------+ PFV      Full                                                        +---------+---------------+---------+-----------+----------+--------------+ POP      Full           Yes      Yes                                 +---------+---------------+---------+-----------+----------+--------------+ PTV      Full                                                        +---------+---------------+---------+-----------+----------+--------------+ PERO     Full                                                         +---------+---------------+---------+-----------+----------+--------------+   +---------+---------------+---------+-----------+----------+--------------+ LEFT     CompressibilityPhasicitySpontaneityPropertiesThrombus Aging +---------+---------------+---------+-----------+----------+--------------+ CFV      Full           Yes      Yes                                 +---------+---------------+---------+-----------+----------+--------------+ SFJ      Full                                                        +---------+---------------+---------+-----------+----------+--------------+  FV Prox  Full           Yes      Yes                                 +---------+---------------+---------+-----------+----------+--------------+ FV Mid   Full           Yes      Yes                                 +---------+---------------+---------+-----------+----------+--------------+ FV DistalFull           Yes      Yes                                 +---------+---------------+---------+-----------+----------+--------------+ PFV      Full                                                        +---------+---------------+---------+-----------+----------+--------------+ POP      Full           Yes      Yes                                 +---------+---------------+---------+-----------+----------+--------------+ PTV      Full                                                        +---------+---------------+---------+-----------+----------+--------------+ PERO     Full                                                        +---------+---------------+---------+-----------+----------+--------------+    Summary: BILATERAL: - No evidence of deep vein thrombosis seen in the lower extremities, bilaterally. -No evidence of popliteal cyst, bilaterally.   *See table(s) above for measurements and observations.    Preliminary    CT Angio Chest PE W/Cm &/Or Wo Cm  Result  Date: 10/29/2023 CORONARY ARTERIES: Pulmonary embolism (PE) suspected, high prob Lower chest/epigastric pain, SOB, hx breast cancer; Abdominal pain, acute, nonlocalized Lower chest/epigastric pain, SOB, hx breast cancer EXAM: CT ANGIOGRAPHY CHEST CT ABDOMEN AND PELVIS WITH CONTRAST TECHNIQUE: Multidetector CT imaging of the chest was performed using the standard protocol during bolus administration of intravenous contrast. Multiplanar CT image reconstructions and MIPs were obtained to evaluate the vascular anatomy. Multidetector CT imaging of the abdomen and pelvis was performed using the standard protocol during bolus administration of intravenous contrast. RADIATION DOSE REDUCTION: This exam was performed according to the departmental dose-optimization program which includes automated exposure control, adjustment of the mA and/or kV according to patient size and/or use of iterative reconstruction technique. CONTRAST:  OMNIPAQUE IOHEXOL 350 MG/ML SOLN COMPARISON:  None Available. FINDINGS: CTA CHEST FINDINGS Cardiovascular: There is  a small volume occlusive thrombus in the distal segmental/proximal subsegmental pulmonary artery branch to the right lower lobe. No evidence of right heart strain no lung infarction. Mild cardiomegaly. No pericardial effusion. No aortic aneurysm. There is dilation of the main pulmonary trunk measuring up to 3.1 cm, which is nonspecific but can be seen with pulmonary artery hypertension. Mediastinum/Nodes: Visualized thyroid gland appears grossly unremarkable. No solid / cystic mediastinal masses. The esophagus is nondistended precluding optimal assessment. No axillary, mediastinal or hilar lymphadenopathy by size criteria. Lungs/Pleura: The central tracheo-bronchial tree is patent. There are dependent changes and patchy areas of linear, plate-like atelectasis and/or scarring throughout bilateral lungs. No mass or consolidation. No pleural effusion or pneumothorax. No suspicious  lung nodules. Musculoskeletal: The visualized soft tissues of the chest wall are grossly unremarkable. No suspicious osseous lesions. There are mild multilevel degenerative changes in the visualized spine. Review of the MIP images confirms the above findings. CT ABDOMEN and PELVIS FINDINGS Hepatobiliary: The liver is normal in size. Non-cirrhotic configuration. There are multiple ill-defined hypoattenuating liver lesions occupying 10-20% of the total surface of liver parenchyma, compatible with metastases. The largest such lesion is in the caudate lobe measuring up to 2.8 x 3.8 cm. No intrahepatic or extrahepatic bile duct dilation. No calcified gallstones. Normal gallbladder wall thickness. No pericholecystic inflammatory changes. Pancreas: There is an ill-defined approximately 2.0 x 2.9 cm hypoattenuating lesion centered in the pancreatic neck/proximal body region. There is subtle prominence of upstream main pancreatic duct. Pancreas is otherwise essentially unremarkable. No peripancreatic fat stranding. Spleen: Within normal limits. No focal lesion. Adrenals/Urinary Tract: Adrenal glands are unremarkable. No suspicious renal mass. No hydronephrosis. No renal or ureteric calculi. Unremarkable urinary bladder. Stomach/Bowel: No disproportionate dilation of the small or large bowel loops. No evidence of abnormal bowel wall thickening or inflammatory changes. The appendix is unremarkable. Vascular/Lymphatic: No ascites or pneumoperitoneum. No abdominal or pelvic lymphadenopathy, by size criteria. No aneurysmal dilation of the major abdominal arteries. 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 Reproductive: Normal-sized retroverted uterus, not well evaluated on this exam. No large adnexal mass seen. Other: There is a tiny fat containing umbilical hernia. The soft tissues and abdominal wall are  otherwise unremarkable. Musculoskeletal: No suspicious osseous lesions. There are mild multilevel degenerative changes in the visualized spine. Review of the MIP images confirms the above findings. IMPRESSION: 1. There is a small volume occlusive thrombus in the distal segmental/proximal subsegmental pulmonary artery the right lung lower lobe. No right heart strain no lung infarction. 2. No lung mass, consolidation, pleural effusion or pneumothorax. 3. There are multiple ill-defined liver masses, compatible with metastases. 4. There is an ill-defined 2.0 x 2.9 cm hypoattenuating lesion in the pancreatic neck/proximal body which is favored neoplastic. Differential diagnosis includes primary pancreatic tumor with liver metastases versus metastatic pancreatic and liver lesions in patient with known history of breast carcinoma, less likely. Correlate with tumor markers and tissue sampling. 5. 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 6. Multiple other nonacute observations, as described above. Critical Value/emergent results were called by telephone at the time of interpretation on 10/29/2023 at 3:31 pm to provider [facility] DAVIS , who verbally acknowledged these results. Electronically Signed   By: Jules Schick M.D.   On: 10/29/2023 15:39   CT ABDOMEN  PELVIS W CONTRAST  Result Date: 10/29/2023 CORONARY ARTERIES: Pulmonary embolism (PE) suspected, high prob Lower chest/epigastric pain, SOB, hx breast cancer; Abdominal pain, acute, nonlocalized Lower chest/epigastric pain, SOB, hx breast cancer EXAM: CT ANGIOGRAPHY CHEST CT ABDOMEN AND PELVIS WITH CONTRAST TECHNIQUE: Multidetector CT imaging of the chest was performed using the standard protocol during bolus administration of intravenous contrast. Multiplanar CT image reconstructions and MIPs were obtained to evaluate the vascular anatomy.  Multidetector CT imaging of the abdomen and pelvis was performed using the standard protocol during bolus administration of intravenous contrast. RADIATION DOSE REDUCTION: This exam was performed according to the departmental dose-optimization program which includes automated exposure control, adjustment of the mA and/or kV according to patient size and/or use of iterative reconstruction technique. CONTRAST:  OMNIPAQUE IOHEXOL 350 MG/ML SOLN COMPARISON:  None Available. FINDINGS: CTA CHEST FINDINGS Cardiovascular: There is a small volume occlusive thrombus in the distal segmental/proximal subsegmental pulmonary artery branch to the right lower lobe. No evidence of right heart strain no lung infarction. Mild cardiomegaly. No pericardial effusion. No aortic aneurysm. There is dilation of the main pulmonary trunk measuring up to 3.1 cm, which is nonspecific but can be seen with pulmonary artery hypertension. Mediastinum/Nodes: Visualized thyroid gland appears grossly unremarkable. No solid / cystic mediastinal masses. The esophagus is nondistended precluding optimal assessment. No axillary, mediastinal or hilar lymphadenopathy by size criteria. Lungs/Pleura: The central tracheo-bronchial tree is patent. There are dependent changes and patchy areas of linear, plate-like atelectasis and/or scarring throughout bilateral lungs. No mass or consolidation. No pleural effusion or pneumothorax. No suspicious lung nodules. Musculoskeletal: The visualized soft tissues of the chest wall are grossly unremarkable. No suspicious osseous lesions. There are mild multilevel degenerative changes in the visualized spine. Review of the MIP images confirms the above findings. CT ABDOMEN and PELVIS FINDINGS Hepatobiliary: The liver is normal in size. Non-cirrhotic configuration. There are multiple ill-defined hypoattenuating liver lesions occupying 10-20% of the total surface of liver parenchyma, compatible with metastases. The largest  such lesion is in the caudate lobe measuring up to 2.8 x 3.8 cm. No intrahepatic or extrahepatic bile duct dilation. No calcified gallstones. Normal gallbladder wall thickness. No pericholecystic inflammatory changes. Pancreas: There is an ill-defined approximately 2.0 x 2.9 cm hypoattenuating lesion centered in the pancreatic neck/proximal body region. There is subtle prominence of upstream main pancreatic duct. Pancreas is otherwise essentially unremarkable. No peripancreatic fat stranding. Spleen: Within normal limits. No focal lesion. Adrenals/Urinary Tract: Adrenal glands are unremarkable. No suspicious renal mass. No hydronephrosis. No renal or ureteric calculi. Unremarkable urinary bladder. Stomach/Bowel: No disproportionate dilation of the small or large bowel loops. No evidence of abnormal bowel wall thickening or inflammatory changes. The appendix is unremarkable. Vascular/Lymphatic: No ascites or pneumoperitoneum. No abdominal or pelvic lymphadenopathy, by size criteria. No aneurysmal dilation of the major abdominal arteries. 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 Reproductive: Normal-sized retroverted uterus, not well evaluated on this exam. No large adnexal mass seen. Other: There is a tiny fat containing umbilical hernia. The soft tissues and abdominal wall are otherwise unremarkable. Musculoskeletal: No suspicious osseous lesions. There are mild multilevel degenerative changes in the visualized spine. Review of the MIP images confirms the above findings. IMPRESSION: 1. There is a small volume occlusive thrombus in the distal segmental/proximal subsegmental pulmonary artery the right lung lower lobe. No right heart strain no lung infarction. 2. No lung  mass, consolidation, pleural effusion or pneumothorax. 3. There are multiple ill-defined liver masses, compatible with metastases.  4. There is an ill-defined 2.0 x 2.9 cm hypoattenuating lesion in the pancreatic neck/proximal body which is favored neoplastic. Differential diagnosis includes primary pancreatic tumor with liver metastases versus metastatic pancreatic and liver lesions in patient with known history of breast carcinoma, less likely. Correlate with tumor markers and tissue sampling. 5. 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 6. Multiple other nonacute observations, as described above. Critical Value/emergent results were called by telephone at the time of interpretation on 10/29/2023 at 3:31 pm to provider [facility] DAVIS , who verbally acknowledged these results. Electronically Signed   By: Jules Schick M.D.   On: 10/29/2023 15:39       Kathlen Mody M.D. [facility] 10/30/2023, 4:15 PM  Available via Epic secure chat 7am-7pm After 7 pm, please refer to night coverage provider listed on amion.

## 2023-10-30 NOTE — Progress Notes (Signed)
PHARMACY - ANTICOAGULATION CONSULT NOTE  Pharmacy Consult for heparin Indication: pulmonary embolus  No Known Allergies  Patient Measurements: Height: 5' 1.5" (156.2 cm) Weight: 89.4 kg (197 lb) IBW/kg (Calculated) : 48.95 Heparin Dosing Weight: 69.6kg  Vital Signs: Temp: 98.7 F (37.1 C) (11/01 0547) Temp Source: Oral (11/01 0547) BP: 113/71 (11/01 0547) Pulse Rate: 78 (11/01 0547)  Labs: Recent Labs    10/29/23 1234 10/29/23 1508 10/29/23 2233 10/30/23 0721  HGB 13.1  --   --  12.2  HCT 40.3  --   --  37.7  PLT 367  --   --  318  HEPARINUNFRC  --   --  0.17* 0.29*  CREATININE 0.61  --   --  0.31*  TROPONINIHS 5 5  --   --     Estimated Creatinine Clearance: 76 mL/min (A) (by C-G formula based on SCr of 0.31 mg/dL (L)).   Medical History: Past Medical History:  Diagnosis Date   Allergy    Back pain    Cancer (HCC) 2019   left breast cancer-last radiation Dec.2019   Diabetes mellitus without complication (HCC)    Medical history non-contributory    Personal history of radiation therapy    Prediabetes    Tiredness    Vitamin D deficiency    Assessment: 62YOF with PMH of breast cancer on letrozole presented with chest tightness and shortness of breath. CTA chest positive small volume occlusive thrombus in the distal segmental/proximal subsegmental pulmonary artery in the right lung lower lobe. Pharmacy consulted to manage heparin.  Today: -Heparin level 0.29 - slightly subtherapeutic on heparin infusion at 1400 units/hr -CBC stable -No complications of therapy noted -Per RN, possible procedure/biopsy   Goal of Therapy:  Heparin level 0.3-0.7 units/ml Monitor platelets by anticoagulation protocol: Yes   Plan:  -Increase heparin infusion to 1500 units/hr -Check heparin level in 6 hours and daily while on heparin -Continue to monitor CBC and signs/symptoms of bleeding -Follow up transition to oral anticoagulation once appropriate  Pricilla Riffle,  PharmD, BCPS Clinical Pharmacist 10/30/2023 9:04 AM

## 2023-10-30 NOTE — Progress Notes (Signed)
  Echocardiogram 2D Echocardiogram has been performed.  Milda Smart 10/30/2023, 2:48 PM

## 2023-10-30 NOTE — Consult Note (Signed)
Chief Complaint: Patient was seen in consultation today for lesions in liver  at the request of Dr V. Blake Divine  Supervising Physician: Roanna Banning  Patient Status: [facility] - In-pt  History of Present Illness: Ashley Clark is a 62 y.o. female with a medical history of benign colon, DM, HTN, and left breast cancer. She was admitted to the hospital for a three week history of shortness of breath, mid sternal chest and epigastric pain. A CT of her chest/abdomen and pelvis revealed: a small volume occlusive thrombus in the distal segmental/proximal subsegmental pulmonary artery of the right lung lower lobe, no right heart strain no lung infarction, multiple ill-defined liver masses, compatible with metastases and  an ill-defined 2.0 x 2.9 cm hypoattenuating lesion in the pancreatic neck/proximal body which is favored neoplastic.  There was also a small segment of complete occulusion versus narrowing of the superior mesenteric vein, IV heparin was started. A request was made by Dr. Thurman Coyer for a Liver lesion biopsy in IR.    Currently with complaints of shortness of breath, chest tightness and epigastric pain. Patient alert and laying in bed,calm. Denies any fevers, headache, cough, abdominal pain, nausea, vomiting or bleeding. Return precautions and treatment recommendations and follow-up discussed with the patient  who is agreeable with the plan. Family at bedside.    Past Medical History:  Diagnosis Date   Allergy    Back pain    Cancer (HCC) 2019   left breast cancer-last radiation Dec.2019   Diabetes mellitus without complication (HCC)    Medical history non-contributory    Personal history of radiation therapy    Prediabetes    Tiredness    Vitamin D deficiency     Past Surgical History:  Procedure Laterality Date   BREAST BIOPSY Left 08/04/2018   x3   BREAST LUMPECTOMY Left    09-29-18   BREAST LUMPECTOMY WITH RADIOACTIVE SEED AND SENTINEL LYMPH NODE BIOPSY Left 09/29/2018    Procedure: LEFT BREAST LUMPECTOMY WITH RADIOACTIVE SEED AND SENTINEL LYMPH NODE BIOPSY;  Surgeon: [name], MD;  Location: [facility];  Service: General;  Laterality: Left;   COLONOSCOPY  2016   POLYPECTOMY     WISDOM TOOTH EXTRACTION      Allergies: Patient has no known allergies.  Medications: Prior to Admission medications   Medication Sig Start Date End Date Taking? Authorizing Provider  cetirizine (ZYRTEC) 10 MG chewable tablet Chew 10 mg by mouth daily.   Yes [provider]  ibuprofen (ADVIL,MOTRIN) 800 MG tablet Take 1 tablet (800 mg total) by mouth every 8 (eight) hours as needed. 09/29/18  Yes Cornett, Maisie Fus, MD  letrozole [facility]) 2.5 MG tablet Take 1 tablet (2.5 mg total) by mouth daily. 12/02/22  Yes [name], MD  losartan (COZAAR) 25 MG tablet Take 1 tablet (25 mg total) by mouth daily for kidney protection. 06/26/22  Yes   atorvastatin (LIPITOR) 10 MG tablet Take 1 tablet (10 mg total) by mouth daily. Patient not taking: Reported on 10/30/2023 08/11/23     COVID-19 mRNA bivalent vaccine, Pfizer, (PFIZER COVID-19 VAC BIVALENT) injection Inject into the muscle. Patient not taking: Reported on 07/22/2023 01/10/22     influenza vac split quadrivalent PF (FLUARIX) 0.5 ML injection Inject 0.5 mLs into the muscle. Patient not taking: Reported on 07/22/2023 10/03/22   Judyann Munson, MD  losartan (COZAAR) 25 MG tablet Take 1 tablet (25 mg total) by mouth daily for kidney protection Patient not taking: Reported on 10/30/2023  02/06/23        Family History  Problem Relation Age of Onset   Breast cancer Mother 48   Colon polyps Mother    Colon cancer Neg Hx    Rectal cancer Neg Hx    Stomach cancer Neg Hx    Esophageal cancer Neg Hx     Social History   Socioeconomic History   Marital status: Married    Spouse name: Not on file   Number of children: Not on file   Years of education: Not on file   Highest education level: Not on file   Occupational History   Not on file  Tobacco Use   Smoking status: Former    Current packs/day: 0.00    Types: Cigarettes    Quit date: 01/2015    Years since quitting: 8.7   Smokeless tobacco: Never   Tobacco comments:    3-4 cigs a day   Vaping Use   Vaping status: Never Used  Substance and Sexual Activity   Alcohol use: Yes    Alcohol/week: 4.0 standard drinks of alcohol    Types: 4 Glasses of wine per week   Drug use: No   Sexual activity: Not on file  Other Topics Concern   Not on file  Social History Narrative   Not on file   Social Determinants of Health   Financial Resource Strain: Not on file  Food Insecurity: No Food Insecurity (10/29/2023)   Hunger Vital Sign    Worried About Running Out of Food in the Last Year: Never true    Ran Out of Food in the Last Year: Never true  Transportation Needs: No Transportation Needs (10/29/2023)   PRAPARE - Administrator, Civil Service (Medical): No    Lack of Transportation (Non-Medical): No  Physical Activity: Not on file  Stress: Not on file  Social Connections: Not on file    Review of Systems: A 12 point ROS discussed and pertinent positives are indicated in the HPI above.  All other systems are negative.  Review of Systems  Constitutional:  Positive for activity change, appetite change and fatigue. Negative for chills and fever.  Respiratory:  Positive for chest tightness and shortness of breath.   Cardiovascular:  Positive for chest pain. Negative for palpitations and leg swelling.  Gastrointestinal:  Negative for abdominal distention.  Neurological:  Negative for speech difficulty and headaches.  Psychiatric/Behavioral:  Negative for behavioral problems and confusion.     Vital Signs: BP 110/69 (BP Location: Right Arm)   Pulse 74   Temp 98 F (36.7 C) (Oral)   Resp 17   Ht 5' 1.5" (1.562 m)   Wt 197 lb (89.4 kg)   LMP 03/12/2015   SpO2 100%   BMI 36.62 kg/m     Physical  Exam Constitutional:      Appearance: She is not ill-appearing.  Cardiovascular:     Rate and Rhythm: Normal rate and regular rhythm.     Heart sounds: Normal heart sounds.  Pulmonary:     Breath sounds: Normal breath sounds. No decreased breath sounds or wheezing.     Comments: Patient states she is intermittently short of breath  Abdominal:     General: Bowel sounds are normal.     Palpations: Abdomen is soft.  Musculoskeletal:     Right lower leg: No edema.     Left lower leg: No edema.  Skin:    General: Skin is warm and dry.  Neurological:     Mental Status: She is alert.  Psychiatric:        Mood and Affect: Mood normal.        Behavior: Behavior normal.     Imaging: CT Angio Chest PE W/Cm &/Or Wo Cm  Result Date: 10/29/2023 CORONARY ARTERIES: Pulmonary embolism (PE) suspected, high prob Lower chest/epigastric pain, SOB, hx breast cancer; Abdominal pain, acute, nonlocalized Lower chest/epigastric pain, SOB, hx breast cancer EXAM: CT ANGIOGRAPHY CHEST CT ABDOMEN AND PELVIS WITH CONTRAST TECHNIQUE: Multidetector CT imaging of the chest was performed using the standard protocol during bolus administration of intravenous contrast. Multiplanar CT image reconstructions and MIPs were obtained to evaluate the vascular anatomy. Multidetector CT imaging of the abdomen and pelvis was performed using the standard protocol during bolus administration of intravenous contrast. RADIATION DOSE REDUCTION: This exam was performed according to the departmental dose-optimization program which includes automated exposure control, adjustment of the mA and/or kV according to patient size and/or use of iterative reconstruction technique. CONTRAST:  OMNIPAQUE IOHEXOL 350 MG/ML SOLN COMPARISON:  None Available. FINDINGS: CTA CHEST FINDINGS Cardiovascular: There is a small volume occlusive thrombus in the distal segmental/proximal subsegmental pulmonary artery branch to the right lower lobe. No evidence  of right heart strain no lung infarction. Mild cardiomegaly. No pericardial effusion. No aortic aneurysm. There is dilation of the main pulmonary trunk measuring up to 3.1 cm, which is nonspecific but can be seen with pulmonary artery hypertension. Mediastinum/Nodes: Visualized thyroid gland appears grossly unremarkable. No solid / cystic mediastinal masses. The esophagus is nondistended precluding optimal assessment. No axillary, mediastinal or hilar lymphadenopathy by size criteria. Lungs/Pleura: The central tracheo-bronchial tree is patent. There are dependent changes and patchy areas of linear, plate-like atelectasis and/or scarring throughout bilateral lungs. No mass or consolidation. No pleural effusion or pneumothorax. No suspicious lung nodules. Musculoskeletal: The visualized soft tissues of the chest wall are grossly unremarkable. No suspicious osseous lesions. There are mild multilevel degenerative changes in the visualized spine. Review of the MIP images confirms the above findings. CT ABDOMEN and PELVIS FINDINGS Hepatobiliary: The liver is normal in size. Non-cirrhotic configuration. There are multiple ill-defined hypoattenuating liver lesions occupying 10-20% of the total surface of liver parenchyma, compatible with metastases. The largest such lesion is in the caudate lobe measuring up to 2.8 x 3.8 cm. No intrahepatic or extrahepatic bile duct dilation. No calcified gallstones. Normal gallbladder wall thickness. No pericholecystic inflammatory changes. Pancreas: There is an ill-defined approximately 2.0 x 2.9 cm hypoattenuating lesion centered in the pancreatic neck/proximal body region. There is subtle prominence of upstream main pancreatic duct. Pancreas is otherwise essentially unremarkable. No peripancreatic fat stranding. Spleen: Within normal limits. No focal lesion. Adrenals/Urinary Tract: Adrenal glands are unremarkable. No suspicious renal mass. No hydronephrosis. No renal or ureteric  calculi. Unremarkable urinary bladder. Stomach/Bowel: No disproportionate dilation of the small or large bowel loops. No evidence of abnormal bowel wall thickening or inflammatory changes. The appendix is unremarkable. Vascular/Lymphatic: No ascites or pneumoperitoneum. No abdominal or pelvic lymphadenopathy, by size criteria. No aneurysmal dilation of the major abdominal arteries. 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 Reproductive: Normal-sized retroverted uterus, not well evaluated on this exam. No large adnexal mass seen. Other: There is a tiny fat containing umbilical hernia. The soft tissues and abdominal wall are otherwise unremarkable. Musculoskeletal: No suspicious osseous lesions. There are mild multilevel degenerative changes  in the visualized spine. Review of the MIP images confirms the above findings. IMPRESSION: 1. There is a small volume occlusive thrombus in the distal segmental/proximal subsegmental pulmonary artery the right lung lower lobe. No right heart strain no lung infarction. 2. No lung mass, consolidation, pleural effusion or pneumothorax. 3. There are multiple ill-defined liver masses, compatible with metastases. 4. There is an ill-defined 2.0 x 2.9 cm hypoattenuating lesion in the pancreatic neck/proximal body which is favored neoplastic. Differential diagnosis includes primary pancreatic tumor with liver metastases versus metastatic pancreatic and liver lesions in patient with known history of breast carcinoma, less likely. Correlate with tumor markers and tissue sampling. 5. 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 6. Multiple other nonacute observations, as described above. Critical Value/emergent results were called by telephone at the time of  interpretation on 10/29/2023 at 3:31 pm to provider [facility] DAVIS , who verbally acknowledged these results. Electronically Signed   By: Jules Schick M.D.   On: 10/29/2023 15:39   CT ABDOMEN PELVIS W CONTRAST  Result Date: 10/29/2023 CORONARY ARTERIES: Pulmonary embolism (PE) suspected, high prob Lower chest/epigastric pain, SOB, hx breast cancer; Abdominal pain, acute, nonlocalized Lower chest/epigastric pain, SOB, hx breast cancer EXAM: CT ANGIOGRAPHY CHEST CT ABDOMEN AND PELVIS WITH CONTRAST TECHNIQUE: Multidetector CT imaging of the chest was performed using the standard protocol during bolus administration of intravenous contrast. Multiplanar CT image reconstructions and MIPs were obtained to evaluate the vascular anatomy. Multidetector CT imaging of the abdomen and pelvis was performed using the standard protocol during bolus administration of intravenous contrast. RADIATION DOSE REDUCTION: This exam was performed according to the departmental dose-optimization program which includes automated exposure control, adjustment of the mA and/or kV according to patient size and/or use of iterative reconstruction technique. CONTRAST:  OMNIPAQUE IOHEXOL 350 MG/ML SOLN COMPARISON:  None Available. FINDINGS: CTA CHEST FINDINGS Cardiovascular: There is a small volume occlusive thrombus in the distal segmental/proximal subsegmental pulmonary artery branch to the right lower lobe. No evidence of right heart strain no lung infarction. Mild cardiomegaly. No pericardial effusion. No aortic aneurysm. There is dilation of the main pulmonary trunk measuring up to 3.1 cm, which is nonspecific but can be seen with pulmonary artery hypertension. Mediastinum/Nodes: Visualized thyroid gland appears grossly unremarkable. No solid / cystic mediastinal masses. The esophagus is nondistended precluding optimal assessment. No axillary, mediastinal or hilar lymphadenopathy by size criteria. Lungs/Pleura: The central  tracheo-bronchial tree is patent. There are dependent changes and patchy areas of linear, plate-like atelectasis and/or scarring throughout bilateral lungs. No mass or consolidation. No pleural effusion or pneumothorax. No suspicious lung nodules. Musculoskeletal: The visualized soft tissues of the chest wall are grossly unremarkable. No suspicious osseous lesions. There are mild multilevel degenerative changes in the visualized spine. Review of the MIP images confirms the above findings. CT ABDOMEN and PELVIS FINDINGS Hepatobiliary: The liver is normal in size. Non-cirrhotic configuration. There are multiple ill-defined hypoattenuating liver lesions occupying 10-20% of the total surface of liver parenchyma, compatible with metastases. The largest such lesion is in the caudate lobe measuring up to 2.8 x 3.8 cm. No intrahepatic or extrahepatic bile duct dilation. No calcified gallstones. Normal gallbladder wall thickness. No pericholecystic inflammatory changes. Pancreas: There is an ill-defined approximately 2.0 x 2.9 cm hypoattenuating lesion centered in the pancreatic neck/proximal body region. There is subtle prominence of upstream main pancreatic duct. Pancreas is otherwise essentially  unremarkable. No peripancreatic fat stranding. Spleen: Within normal limits. No focal lesion. Adrenals/Urinary Tract: Adrenal glands are unremarkable. No suspicious renal mass. No hydronephrosis. No renal or ureteric calculi. Unremarkable urinary bladder. Stomach/Bowel: No disproportionate dilation of the small or large bowel loops. No evidence of abnormal bowel wall thickening or inflammatory changes. The appendix is unremarkable. Vascular/Lymphatic: No ascites or pneumoperitoneum. No abdominal or pelvic lymphadenopathy, by size criteria. No aneurysmal dilation of the major abdominal arteries. There is short segment complete occlusion versus marked narrowing of the upper most portion of superior mesenteric vein at the level of  formation of portal vein. Reproductive: Normal-sized retroverted uterus, not well evaluated on this exam. No large adnexal mass seen. Other: There is a tiny fat containing umbilical hernia. The soft tissues and abdominal wall are otherwise unremarkable. Musculoskeletal: No suspicious osseous lesions. There are mild multilevel degenerative changes in the visualized spine. Review of the MIP images confirms the above findings. IMPRESSION: 1. There is a small volume occlusive thrombus in the distal segmental/proximal subsegmental pulmonary artery the right lung lower lobe. No right heart strain no lung infarction. 2. No lung mass, consolidation, pleural effusion or pneumothorax. 3. There are multiple ill-defined liver masses, compatible with metastases. 4. There is an ill-defined 2.0 x 2.9 cm hypoattenuating lesion in the pancreatic neck/proximal body which is favored neoplastic. Differential diagnosis includes primary pancreatic tumor with liver metastases versus metastatic pancreatic and liver lesions in patient with known history of breast carcinoma, less likely. Correlate with tumor markers and tissue sampling. 5. 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 6. Multiple other nonacute observations, as described above. Critical Value/emergent results were called by telephone at the time of interpretation on 10/29/2023 at 3:31 pm to provider [facility] DAVIS , who verbally acknowledged these results. Electronically Signed   By: Jules Schick M.D.   On: 10/29/2023 15:39   DG Abd 1 View  Result Date: 10/26/2023 CLINICAL DATA:  Epigastric abdominal pain, constipation. EXAM: ABDOMEN - 1 VIEW COMPARISON:  None Available. FINDINGS: The bowel gas pattern is normal. No significant stool burden is noted. No radio-opaque calculi or other significant radiographic abnormality are seen. IMPRESSION: No abnormal  bowel dilatation. Electronically Signed   By: Lupita Raider M.D.   On: 10/26/2023 13:45   DG Chest 2 View  Result Date: 10/26/2023 CLINICAL DATA:  Shortness of breath for 3 weeks. EXAM: CHEST - 2 VIEW COMPARISON:  None Available. FINDINGS: The heart size and mediastinal contours are within normal limits. Both lungs are clear. The visualized skeletal structures are unremarkable. IMPRESSION: No active cardiopulmonary disease. Electronically Signed   By: Lupita Raider M.D.   On: 10/26/2023 13:44    Labs:  CBC: Recent Labs    10/29/23 1234 10/30/23 0721  WBC 8.5 7.9  HGB 13.1 12.2  HCT 40.3 37.7  PLT 367 318    COAGS: No results for input(s): "INR", "APTT" in the last 8760 hours.  BMP: Recent Labs    10/29/23 1234 10/30/23 0721  NA 130* 134*  K 3.7 3.7  CL 93* 97*  CO2 23 24  GLUCOSE 125* 129*  BUN 6* 6*  CALCIUM 8.8* 8.7*  CREATININE 0.61 0.31*  GFRNONAA >60 >60    LIVER FUNCTION TESTS: Recent Labs    10/29/23 1234  BILITOT 0.8  AST 31  ALT 20  ALKPHOS 216*  PROT 7.9  ALBUMIN 3.8  Assessment and Plan: HTN, and left breast cancer. She was admitted to the hospital for a three week history of mid sternal chest and epigastric pain.A CT of her chest/abdomen and pelvis revealed  a small volume occlusive thrombus in the distal segmental/proximal subsegmental pulmonary artery of the right lung lower lobe, no right heart strain no lung infarction, multiple ill-defined liver masses, compatible with metastases and  an ill-defined 2.0 x 2.9 cm hypoattenuating lesion in the pancreatic neck/proximal body which is favored neoplastic.                A request was made by Dr. Thurman Coyer for a Liver lesion biopsy in IR.   The procedure has been reviewed and approved for procedure on 11/02/23 by Dr.J. Mugweru. Orders have been placed for Heparin drip to stop at 0600 on 11/02/23 and NPO status prior to procedure.  Risks and benefits of liver lesion biopsy was discussed with the  patient and/or patient's family including, but not limited to bleeding, infection, damage to adjacent structures or low yield requiring additional tests.  All of the questions were answered and there is agreement to proceed.  Consent signed and in chart.  Thank you for this interesting consult.  I greatly enjoyed meeting Ashley Clark and look forward to participating in their care.  A copy of this report was sent to the requesting provider on this date.  Electronically Signed: Ardith Dark, NP 10/30/2023, 12:29 PM   I spent a total of 40 Minutes    in face to face in clinical consultation, greater than 50% of which was counseling/coordinating care for percutaneous liver lesion biopsy

## 2023-10-30 NOTE — Progress Notes (Signed)
BLE venous duplex has been completed.   Results can be found under chart review under CV PROC. 10/30/2023 2:14 PM Makyia Erxleben RVT, RDMS

## 2023-10-30 NOTE — Plan of Care (Signed)
  Problem: Education: Goal: Knowledge of General Education information will improve Description Including pain rating scale, medication(s)/side effects and non-pharmacologic comfort measures Outcome: Progressing   Problem: Health Behavior/Discharge Planning: Goal: Ability to manage health-related needs will improve Outcome: Progressing   

## 2023-10-31 DIAGNOSIS — K8689 Other specified diseases of pancreas: Secondary | ICD-10-CM | POA: Diagnosis not present

## 2023-10-31 DIAGNOSIS — K769 Liver disease, unspecified: Secondary | ICD-10-CM | POA: Diagnosis not present

## 2023-10-31 DIAGNOSIS — E1159 Type 2 diabetes mellitus with other circulatory complications: Secondary | ICD-10-CM | POA: Diagnosis not present

## 2023-10-31 DIAGNOSIS — I2699 Other pulmonary embolism without acute cor pulmonale: Secondary | ICD-10-CM | POA: Diagnosis not present

## 2023-10-31 LAB — CBC
HCT: 39.9 % (ref 36.0–46.0)
Hemoglobin: 12.8 g/dL (ref 12.0–15.0)
MCH: 26.1 pg (ref 26.0–34.0)
MCHC: 32.1 g/dL (ref 30.0–36.0)
MCV: 81.3 fL (ref 80.0–100.0)
Platelets: 298 10*3/uL (ref 150–400)
RBC: 4.91 MIL/uL (ref 3.87–5.11)
RDW: 14 % (ref 11.5–15.5)
WBC: 8 10*3/uL (ref 4.0–10.5)
nRBC: 0 % (ref 0.0–0.2)

## 2023-10-31 LAB — HEPARIN LEVEL (UNFRACTIONATED)
Heparin Unfractionated: 0.4 [IU]/mL (ref 0.30–0.70)
Heparin Unfractionated: 0.45 [IU]/mL (ref 0.30–0.70)

## 2023-10-31 NOTE — Progress Notes (Signed)
[facility]                                                                               [name], is a 62 y.o. female, DOB - [birth year], ZOX:[id number] Admit date - 10/29/2023    Outpatient Primary MD for the patient is [name], [place], [name]  LOS - 2  days    Brief summary   Ashley Clark is a 62 y.o. female with medical history significant of left breast cancer diagnosed in 2019 s/p left lumpectomy, radiation, on current antiestrogen therapy, hypertension, type 2 diabetes who presented to ED with epigastric and chest pain.   CTA chest/abdomen/pelvis was obtained revealing small volume occlusive thrombus in the distal segment/proximal subsegmental pulmonary RV of the right lower lobe. No right heart strain or lung infarct. However there was a new ill-defined lesion in the pancreatic neck/proximal body favoring neoplasm. There are also ill-defined lesions in the liver concerning for metastasis.    Assessment & Plan    Assessment and Plan: * Pancreatic mass Liver lesions  -on CT abd/pelvis there is an ill-defined 2.0 x 2.9 cm hypoattenuating lesion in the pancreatic neck/proximal body which is favored neoplastic. There are multiple ill-defined liver masses, compatible with metastases. Pain control. IR consulted and plan for biopsy on Monday at 12 pm.  She will follow up with Dr Truett Perna or Dr Mosetta Putt on discharge with results.     Obesity (BMI 30-39.9) Body mass index is 36.62 kg/m.   Acute pulmonary embolism (HCC) -provoked in the setting of new malignancy - echocardiogram does not show any right heart strain.  - venous duplex of the lower extremities are negative for DVT.    Hypertension associated with type 2 diabetes mellitus (HCC) Well controlled.   Diabetes mellitus (HCC) - No recent A1c.  Last one a year ago was around 6.9. -no hyperglycemia on presentation  CBG (last 3)  No results for input(s): "GLUCAP" in the last 72 hours.   Malignant  neoplasm of overlapping sites of left breast in female, estrogen receptor positive (HCC) - S/p left lumpectomy, radiation, on current antiestrogen therapy with letrozole - Follows with primary oncologist Dr. Pamelia Hoit - Recently had MRI mammogram on 06/2023 that was benign  Mild hyponatremia Asymptomatic.    Estimated body mass index is 36.62 kg/m as calculated from the following:   Height as of this encounter: 5' 1.5" (1.562 m).   Weight as of this encounter: 89.4 kg.  Code Status: full code.  DVT Prophylaxis:  Heparin.    Level of Care: Level of care: Telemetry Family Communication: family at bedside.   Disposition Plan:     Remains inpatient appropriate:  pending biopsy on Monday.   Procedures:  Echo.  Venous duplex of the lower extremities.   Consultants:   Oncology.   Antimicrobials:   Anti-infectives (From admission, onward)    None        Medications  Scheduled Meds:  loratadine  10 mg Oral Daily   Continuous Infusions:  heparin 1,650 Units/hr (10/31/23 1523)   PRN Meds:.acetaminophen, traMADol    Subjective:   [name] was seen and examined today.  No new complaints.  Objective:   Vitals:   10/30/23 1815 10/30/23 2135 10/31/23 0528 10/31/23 1332  BP: 120/72 114/71 114/80 (!) 121/91  Pulse: 69 89 75 85  Resp: 16 15 17 18   Temp: 98.3 F (36.8 C) 98.9 F (37.2 C) 98.5 F (36.9 C) 98.5 F (36.9 C)  TempSrc: Oral Oral Oral Oral  SpO2: 99% 98% 99% 98%  Weight:      Height:        Intake/Output Summary (Last 24 hours) at 10/31/2023 1531 Last data filed at 10/31/2023 1400 Gross per 24 hour  Intake 1312.06 ml  Output 1852 ml  Net -539.94 ml   Filed Weights   10/29/23 1231  Weight: 89.4 kg     Exam General exam: Appears calm and comfortable  Respiratory system: Clear to auscultation. Respiratory effort normal. Cardiovascular system: S1 & S2 heard, RRR.  Gastrointestinal system: Abdomen is nondistended, soft and nontender.   Central nervous system: Alert and oriented. No focal neurological deficits. Extremities: Symmetric 5 x 5 power. Skin: No rashes, Psychiatry: Mood & affect appropriate.   Data Reviewed:  I have personally reviewed following labs and imaging studies   CBC Lab Results  Component Value Date   WBC 8.0 10/31/2023   RBC 4.91 10/31/2023   HGB 12.8 10/31/2023   HCT 39.9 10/31/2023   MCV 81.3 10/31/2023   MCH 26.1 10/31/2023   PLT 298 10/31/2023   MCHC 32.1 10/31/2023   RDW 14.0 10/31/2023   LYMPHSABS 1.7 09/27/2018   MONOABS 0.6 09/27/2018   EOSABS 0.1 09/27/2018   BASOSABS 0.0 09/27/2018     Last metabolic panel Lab Results  Component Value Date   NA 134 (L) 10/30/2023   K 3.7 10/30/2023   CL 97 (L) 10/30/2023   CO2 24 10/30/2023   BUN 6 (L) 10/30/2023   CREATININE 0.31 (L) 10/30/2023   GLUCOSE 129 (H) 10/30/2023   GFRNONAA >60 10/30/2023   CALCIUM 8.7 (L) 10/30/2023   PROT 7.9 10/29/2023   ALBUMIN 3.8 10/29/2023   LABGLOB 3.0 06/10/2022   AGRATIO 1.5 06/10/2022   BILITOT 0.8 10/29/2023   ALKPHOS 216 (H) 10/29/2023   AST 31 10/29/2023   ALT 20 10/29/2023   ANIONGAP 13 10/30/2023    CBG (last 3)  No results for input(s): "GLUCAP" in the last 72 hours.    Coagulation Profile: No results for input(s): "INR", "PROTIME" in the last 168 hours.   Radiology Studies: VAS [place] LOWER EXTREMITY VENOUS (DVT)  Result Date: 10/31/2023  Lower Venous DVT Study Patient Name:  Ashley Clark  Date of Exam:   10/30/2023 Medical Rec #: [id number]         Accession #:    [id number] Date of Birth: 01/29/1961         Patient Gender: F Patient Age:   35 years Exam Location:  [facility] Procedure:      VAS [place] LOWER EXTREMITY VENOUS (DVT) Referring Phys: Kathlen Mody --------------------------------------------------------------------------------  Indications: Pulmonary embolism.  Risk Factors: Hx of cancer, newly diagnosed pancreatic and liver lesions. Comparison Study: No  previous exams Performing Technologist: Jody Hill RVT, RDMS  Examination Guidelines: A complete evaluation includes B-mode imaging, spectral Doppler, color Doppler, and power Doppler as needed of all accessible portions of each vessel. Bilateral testing is considered an integral part of a complete examination. Limited examinations for reoccurring indications may be performed as noted. The reflux portion of the exam is performed with the patient in reverse Trendelenburg.  +---------+---------------+---------+-----------+----------+--------------+ RIGHT  CompressibilityPhasicitySpontaneityPropertiesThrombus Aging +---------+---------------+---------+-----------+----------+--------------+ CFV      Full           Yes      Yes                                 +---------+---------------+---------+-----------+----------+--------------+ SFJ      Full                                                        +---------+---------------+---------+-----------+----------+--------------+ FV Prox  Full           Yes      Yes                                 +---------+---------------+---------+-----------+----------+--------------+ FV Mid   Full           Yes      Yes                                 +---------+---------------+---------+-----------+----------+--------------+ FV DistalFull           Yes      Yes                                 +---------+---------------+---------+-----------+----------+--------------+ PFV      Full                                                        +---------+---------------+---------+-----------+----------+--------------+ POP      Full           Yes      Yes                                 +---------+---------------+---------+-----------+----------+--------------+ PTV      Full                                                        +---------+---------------+---------+-----------+----------+--------------+ PERO     Full                                                         +---------+---------------+---------+-----------+----------+--------------+   +---------+---------------+---------+-----------+----------+--------------+ LEFT     CompressibilityPhasicitySpontaneityPropertiesThrombus Aging +---------+---------------+---------+-----------+----------+--------------+ CFV      Full           Yes      Yes                                 +---------+---------------+---------+-----------+----------+--------------+ SFJ  Full                                                        +---------+---------------+---------+-----------+----------+--------------+ FV Prox  Full           Yes      Yes                                 +---------+---------------+---------+-----------+----------+--------------+ FV Mid   Full           Yes      Yes                                 +---------+---------------+---------+-----------+----------+--------------+ FV DistalFull           Yes      Yes                                 +---------+---------------+---------+-----------+----------+--------------+ PFV      Full                                                        +---------+---------------+---------+-----------+----------+--------------+ POP      Full           Yes      Yes                                 +---------+---------------+---------+-----------+----------+--------------+ PTV      Full                                                        +---------+---------------+---------+-----------+----------+--------------+ PERO     Full                                                        +---------+---------------+---------+-----------+----------+--------------+     Summary: BILATERAL: - No evidence of deep vein thrombosis seen in the lower extremities, bilaterally. -No evidence of popliteal cyst, bilaterally.   *See table(s) above for measurements and observations. Electronically signed  by Coral Else MD on 10/31/2023 at 10:49:39 AM.    Final    ECHOCARDIOGRAM COMPLETE  Result Date: 10/30/2023    ECHOCARDIOGRAM REPORT   Patient Name:   SHARAE ZAPPULLA Date of Exam: 10/30/2023 Medical Rec #:  [id number]        Height:       61.5 in Accession #:    [id number]       Weight:       197.0 lb Date of Birth:  22-Jan-1961        BSA:  1.888 m Patient Age:    62 years         BP:           116/75 mmHg Patient Gender: F                HR:           69 bpm. Exam Location:  Inpatient Procedure: 2D Echo, Color Doppler, Cardiac Doppler and Intracardiac            Opacification Agent Indications:    Pulmonary embolus  History:        Patient has no prior history of Echocardiogram examinations.                 Risk Factors:Diabetes and Hypertension. CA, hx of radiation                 therapy.  Sonographer:    Milda Smart Referring Phys: Herminio Heads  Sonographer Comments: Image acquisition challenging due to patient body habitus. Suboptimal images IMPRESSIONS  1. Poor acoustic windows limit study, even with use of Definity.  2. Left ventricular ejection fraction, by estimation, is 65 to 70%. The left ventricle has normal function. The left ventricle has no regional wall motion abnormalities. Left ventricular diastolic parameters were normal.  3. Right ventricular systolic function is normal. The right ventricular size is normal.  4. The mitral valve is normal in structure. Trivial mitral valve regurgitation.  5. The aortic valve was not well visualized. Aortic valve regurgitation is not visualized.  6. The inferior vena cava is normal in size with greater than 50% respiratory variability, suggesting right atrial pressure of 3 mmHg. FINDINGS  Left Ventricle: Left ventricular ejection fraction, by estimation, is 65 to 70%. The left ventricle has normal function. The left ventricle has no regional wall motion abnormalities. Definity contrast agent was given IV to delineate the left ventricular   endocardial borders. The left ventricular internal cavity size was normal in size. There is no left ventricular hypertrophy. Left ventricular diastolic parameters were normal. Right Ventricle: The right ventricular size is normal. Right vetricular wall thickness was not assessed. Right ventricular systolic function is normal. Left Atrium: Left atrial size was normal in size. Right Atrium: Right atrial size was normal in size. Pericardium: There is no evidence of pericardial effusion. Mitral Valve: The mitral valve is normal in structure. Trivial mitral valve regurgitation. Tricuspid Valve: The tricuspid valve is normal in structure. Tricuspid valve regurgitation is trivial. Aortic Valve: The aortic valve was not well visualized. Aortic valve regurgitation is not visualized. Pulmonic Valve: The pulmonic valve was normal in structure. Pulmonic valve regurgitation is not visualized. Aorta: The aortic root and ascending aorta are structurally normal, with no evidence of dilitation. Venous: The inferior vena cava is normal in size with greater than 50% respiratory variability, suggesting right atrial pressure of 3 mmHg. IAS/Shunts: No atrial level shunt detected by color flow Doppler.  LEFT VENTRICLE PLAX 2D LVIDd:         4.20 cm   Diastology LVIDs:         2.60 cm   LV e' medial:    7.40 cm/s LV PW:         0.90 cm   LV E/e' medial:  8.7 LV IVS:        0.80 cm   LV e' lateral:   11.70 cm/s LVOT diam:     2.00 cm   LV E/e' lateral: 5.5 LV SV:  79 LV SV Index:   42 LVOT Area:     3.14 cm  RIGHT VENTRICLE RV S prime:     14.40 cm/s TAPSE (M-mode): 2.4 cm LEFT ATRIUM             Index        RIGHT ATRIUM          Index LA diam:        2.70 cm 1.43 cm/m   RA Area:     9.08 cm LA Vol (A2C):   28.6 ml 15.15 ml/m  RA Volume:   17.70 ml 9.37 ml/m LA Vol (A4C):   23.6 ml 12.50 ml/m LA Biplane Vol: 27.2 ml 14.41 ml/m  AORTIC VALVE LVOT Vmax:   135.00 cm/s LVOT Vmean:  92.800 cm/s LVOT VTI:    0.253 m  AORTA Ao Root  diam: 2.80 cm Ao Asc diam:  3.40 cm MITRAL VALVE MV Area (PHT): 2.75 cm    SHUNTS MV Decel Time: 276 msec    Systemic VTI:  0.25 m MV E velocity: 64.70 cm/s  Systemic Diam: 2.00 cm MV A velocity: 84.00 cm/s MV E/A ratio:  0.77 Dietrich Pates MD Electronically signed by Dietrich Pates MD Signature Date/Time: 10/30/2023/5:22:54 PM    Final        Kathlen Mody M.D. [facility] 10/31/2023, 3:31 PM  Available via Epic secure chat 7am-7pm After 7 pm, please refer to night coverage provider listed on amion.

## 2023-10-31 NOTE — Progress Notes (Signed)
PHARMACY - ANTICOAGULATION CONSULT NOTE  Pharmacy Consult for heparin Indication: pulmonary embolus  No Known Allergies  Patient Measurements: Height: 5' 1.5" (156.2 cm) Weight: 89.4 kg (197 lb) IBW/kg (Calculated) : 48.95 Heparin Dosing Weight: 69.6kg  Vital Signs: Temp: 98.9 F (37.2 C) (11/01 2135) Temp Source: Oral (11/01 2135) BP: 114/71 (11/01 2135) Pulse Rate: 89 (11/01 2135)  Labs: Recent Labs    10/29/23 1234 10/29/23 1508 10/29/23 2233 10/30/23 0721 10/30/23 1640 10/30/23 1926 10/31/23 0255  HGB 13.1  --   --  12.2  --   --  12.8  HCT 40.3  --   --  37.7  --   --  39.9  PLT 367  --   --  318  --   --  298  HEPARINUNFRC  --   --    < > 0.29* 0.23* 0.22* 0.40  CREATININE 0.61  --   --  0.31*  --   --   --   TROPONINIHS 5 5  --   --   --   --   --    < > = values in this interval not displayed.    Estimated Creatinine Clearance: 76 mL/min (A) (by C-G formula based on SCr of 0.31 mg/dL (L)).   Medical History: Past Medical History:  Diagnosis Date   Allergy    Back pain    Cancer (HCC) 2019   left breast cancer-last radiation Dec.2019   Diabetes mellitus without complication (HCC)    Medical history non-contributory    Personal history of radiation therapy    Prediabetes    Tiredness    Vitamin D deficiency    Assessment: 62YOF with PMH of breast cancer on letrozole presented with chest tightness and shortness of breath. CTA chest positive small volume occlusive thrombus in the distal segmental/proximal subsegmental pulmonary artery in the right lung lower lobe. Pharmacy consulted to manage heparin.  Today: -Heparin level 0.40 - therapeutic on heparin infusion at 1650 units/hr -CBC WNL -No complications of therapy noted  Goal of Therapy:  Heparin level 0.3-0.7 units/ml Monitor platelets by anticoagulation protocol: Yes   Plan:  -continue heparin drip at 1650 units/hr -Check heparin level in 6 hours and daily while on heparin -Continue to  monitor CBC and signs/symptoms of bleeding -Follow up transition to oral anticoagulation once appropriate  Arley Phenix RPh 10/31/2023, 4:03 AM

## 2023-10-31 NOTE — Plan of Care (Signed)
  Problem: Activity: Goal: Risk for activity intolerance will decrease Outcome: Progressing   Problem: Nutrition: Goal: Adequate nutrition will be maintained Outcome: Completed/Met

## 2023-10-31 NOTE — Progress Notes (Signed)
PHARMACY - ANTICOAGULATION CONSULT NOTE  Pharmacy Consult for heparin Indication: pulmonary embolus  No Known Allergies  Patient Measurements: Height: 5' 1.5" (156.2 cm) Weight: 89.4 kg (197 lb) IBW/kg (Calculated) : 48.95 Heparin Dosing Weight: 69.6kg  Vital Signs: Temp: 98.5 F (36.9 C) (11/02 0528) Temp Source: Oral (11/02 0528) BP: 114/80 (11/02 0528) Pulse Rate: 75 (11/02 0528)  Labs: Recent Labs    10/29/23 1234 10/29/23 1508 10/29/23 2233 10/30/23 0721 10/30/23 1640 10/30/23 1926 10/31/23 0255 10/31/23 0856  HGB 13.1  --   --  12.2  --   --  12.8  --   HCT 40.3  --   --  37.7  --   --  39.9  --   PLT 367  --   --  318  --   --  298  --   HEPARINUNFRC  --   --    < > 0.29*   < > 0.22* 0.40 0.45  CREATININE 0.61  --   --  0.31*  --   --   --   --   TROPONINIHS 5 5  --   --   --   --   --   --    < > = values in this interval not displayed.    Estimated Creatinine Clearance: 76 mL/min (A) (by C-G formula based on SCr of 0.31 mg/dL (L)).   Medical History: Past Medical History:  Diagnosis Date   Allergy    Back pain    Cancer (HCC) 2019   left breast cancer-last radiation Dec.2019   Diabetes mellitus without complication (HCC)    Medical history non-contributory    Personal history of radiation therapy    Prediabetes    Tiredness    Vitamin D deficiency    Assessment: 62YOF with PMH of breast cancer on letrozole presented with chest tightness and shortness of breath. CTA chest positive small volume occlusive thrombus in the distal segmental/proximal subsegmental pulmonary artery in the right lung lower lobe. CT abd/pelvis with pancreatic mass with liver lesions concerning for metastases. Plan for liver biopsy on Monday 11/4. Pharmacy consulted to manage heparin.  Today: -Confirmatory heparin level 0.45 - therapeutic on heparin infusion at 1650 units/hr -CBC WNL -No complications of therapy noted -Per IR note, heparin to be held at 0600 day of  procedure  Goal of Therapy:  Heparin level 0.3-0.7 units/ml Monitor platelets by anticoagulation protocol: Yes   Plan:  -Continue heparin infusion at 1650 units/hr -Daily heparin level -Continue to monitor CBC and signs/symptoms of bleeding -Follow up transition to oral anticoagulation once appropriate   Pricilla Riffle, PharmD, BCPS Clinical Pharmacist 10/31/2023 9:56 AM

## 2023-11-01 DIAGNOSIS — E1159 Type 2 diabetes mellitus with other circulatory complications: Secondary | ICD-10-CM | POA: Diagnosis not present

## 2023-11-01 DIAGNOSIS — I2699 Other pulmonary embolism without acute cor pulmonale: Secondary | ICD-10-CM | POA: Diagnosis not present

## 2023-11-01 DIAGNOSIS — K769 Liver disease, unspecified: Secondary | ICD-10-CM | POA: Diagnosis not present

## 2023-11-01 DIAGNOSIS — K8689 Other specified diseases of pancreas: Secondary | ICD-10-CM | POA: Diagnosis not present

## 2023-11-01 LAB — CBC
HCT: 40.8 % (ref 36.0–46.0)
Hemoglobin: 12.9 g/dL (ref 12.0–15.0)
MCH: 25.7 pg — ABNORMAL LOW (ref 26.0–34.0)
MCHC: 31.6 g/dL (ref 30.0–36.0)
MCV: 81.3 fL (ref 80.0–100.0)
Platelets: 308 10*3/uL (ref 150–400)
RBC: 5.02 MIL/uL (ref 3.87–5.11)
RDW: 13.9 % (ref 11.5–15.5)
WBC: 7.8 10*3/uL (ref 4.0–10.5)
nRBC: 0 % (ref 0.0–0.2)

## 2023-11-01 LAB — CEA: CEA: 80.4 ng/mL — ABNORMAL HIGH (ref 0.0–4.7)

## 2023-11-01 LAB — HEPARIN LEVEL (UNFRACTIONATED): Heparin Unfractionated: 0.33 [IU]/mL (ref 0.30–0.70)

## 2023-11-01 NOTE — Plan of Care (Signed)
  Problem: Education: Goal: Knowledge of General Education information will improve Description Including pain rating scale, medication(s)/side effects and non-pharmacologic comfort measures Outcome: Progressing   

## 2023-11-01 NOTE — Progress Notes (Signed)
IR was requested for image guided PAC placement.   Patient is 75 oy female with hx of left breast CA diagnosed in 2019 s/p radiation and antiestrogen therapy, mammo on 07/17/23 showed no malignancy.  Patient came to ED on 10/31 and work up showed small PE, pancreatic mass and liver lesions, cancer markers pending and IR was consulted for liver lesion bx and she is tentatively  scheduled for Monday. Patient was started on heparin infusion.  Oncology note from 11/1 states if pancreatic cancer is confirmed, will arrange outpatient follow-up with GI oncology.   IR was consulted for [facility] placement today. Spoke with the ordering provider, patient requested port to be placed at the same time of liver lesion bx.   Patient was informed that without formal diagnosis and treatment plan in place, it seems premature to placed a port as it will require routine flushing and may increase chance of infection. IR will discuss with the oncology on Monday.   Patient was also informed that it is unlikely that IR can perform liver lesion bx and port placement at the same time as those procedures utilize different modality ([place] and IR.) Recommended proceeding with liver lesion bx tomorrow as it will require more coordination as patient is currently on heparin infusion due to small PE, patient was informed that IR will attempt to place a port but it is very unlikely.   Patient verbalized understanding.   Risks and benefits of image guided port-a-catheter placement was discussed with the patient including, but not limited to bleeding, infection, pneumothorax, or fibrin sheath development and need for additional procedures.  All of the patient's questions were answered, patient is agreeable to proceed. Consent signed and in chart.  Please call IR for questions and concerns.   Lynann Bologna Hanya Guerin PA-C 11/01/2023 10:46 AM

## 2023-11-01 NOTE — Progress Notes (Signed)
PHARMACY - ANTICOAGULATION CONSULT NOTE  Pharmacy Consult for heparin Indication: pulmonary embolus  No Known Allergies  Patient Measurements: Height: 5' 1.5" (156.2 cm) Weight: 89.4 kg (197 lb) IBW/kg (Calculated) : 48.95 Heparin Dosing Weight: 69.6kg  Vital Signs: Temp: 98 F (36.7 C) (11/03 0623) Temp Source: Oral (11/03 0623) BP: 121/68 (11/03 0623) Pulse Rate: 84 (11/03 0623)  Labs: Recent Labs    10/29/23 1234 10/29/23 1508 10/29/23 2233 10/30/23 0721 10/30/23 1640 10/31/23 0255 10/31/23 0856 11/01/23 0446  HGB 13.1  --   --  12.2  --  12.8  --  12.9  HCT 40.3  --   --  37.7  --  39.9  --  40.8  PLT 367  --   --  318  --  298  --  308  HEPARINUNFRC  --   --    < > 0.29*   < > 0.40 0.45 0.33  CREATININE 0.61  --   --  0.31*  --   --   --   --   TROPONINIHS 5 5  --   --   --   --   --   --    < > = values in this interval not displayed.    Estimated Creatinine Clearance: 76 mL/min (A) (by C-G formula based on SCr of 0.31 mg/dL (L)).   Medical History: Past Medical History:  Diagnosis Date   Allergy    Back pain    Cancer (HCC) 2019   left breast cancer-last radiation Dec.2019   Diabetes mellitus without complication (HCC)    Medical history non-contributory    Personal history of radiation therapy    Prediabetes    Tiredness    Vitamin D deficiency    Assessment: 62YOF with PMH of breast cancer on letrozole presented with chest tightness and shortness of breath. CTA chest positive small volume occlusive thrombus in the distal segmental/proximal subsegmental pulmonary artery in the right lung lower lobe. CT abd/pelvis with pancreatic mass with liver lesions concerning for metastases. Plan for liver biopsy on Monday 11/4. Pharmacy consulted to manage heparin.  Today: -Heparin level is therapeutic at 0.33, on 1650 units/hr. -Hgb 12.9, plt 308--stable. -No line issues or signs/symptoms of bleeding reported. -Per IR note, heparin to be held at 0600 day  of procedure.  Goal of Therapy:  Heparin level 0.3-0.7 units/ml Monitor platelets by anticoagulation protocol: Yes   Plan:  -Continue heparin infusion at 1650 units/hr -Daily heparin level -Continue to monitor CBC and signs/symptoms of bleeding -Stop heparin gtt 11/4 @0600 , per IR -Follow up transition to oral anticoagulation once appropriate   Cherylin Mylar, PharmD Clinical Pharmacist  11/3/20247:31 AM

## 2023-11-01 NOTE — Progress Notes (Signed)
[facility]                                                                               Ashley Clark, is a 62 y.o. female, DOB - 1960/12/30, GNF:[id number] Admit date - 10/29/2023    Outpatient Primary MD for the patient is [name], [place], [name]  LOS - 3  days    Brief summary   Ashley Clark is a 62 y.o. female with medical history significant of left breast cancer diagnosed in 2019 s/p left lumpectomy, radiation, on current antiestrogen therapy, hypertension, type 2 diabetes who presented to ED with epigastric and chest pain.   CTA chest/abdomen/pelvis was obtained revealing small volume occlusive thrombus in the distal segment/proximal subsegmental pulmonary RV of the right lower lobe. No right heart strain or lung infarct. However there was a new ill-defined lesion in the pancreatic neck/proximal body favoring neoplasm. There are also ill-defined lesions in the liver concerning for metastasis.    Assessment & Plan    Assessment and Plan: * Pancreatic mass Liver lesions  -on CT abd/pelvis there is an ill-defined 2.0 x 2.9 cm hypoattenuating lesion in the pancreatic neck/proximal body which is favored neoplastic. There are multiple ill-defined liver masses, compatible with metastases. Pain control. IR consulted and plan for biopsy on Monday at 12 pm.  She will follow up with Dr Truett Perna or Dr Mosetta Putt on discharge with results.  Patient requesting for port A cath placement in am.  Intermittent epigastric pain present.     Obesity (BMI 30-39.9) Body mass index is 36.62 kg/m.   Acute pulmonary embolism (HCC) -provoked in the setting of new malignancy - echocardiogram does not show any right heart strain.  - venous duplex of the lower extremities are negative for DVT.  Pt reports breathing better, no chest pain.    Hypertension associated with type 2 diabetes mellitus (HCC) Well controlled.   Diabetes mellitus (HCC) - No recent A1c.  Last one a year ago  was around 6.9. -no hyperglycemia on presentation  CBG (last 3)  No results for input(s): "GLUCAP" in the last 72 hours.   Malignant neoplasm of overlapping sites of left breast in female, estrogen receptor positive (HCC) - S/p left lumpectomy, radiation, on current antiestrogen therapy with letrozole - Follows with primary oncologist Dr. Pamelia Hoit - Recently had MRI mammogram on 06/2023 that was benign  Mild hyponatremia Asymptomatic.    Estimated body mass index is 36.62 kg/m as calculated from the following:   Height as of this encounter: 5' 1.5" (1.562 m).   Weight as of this encounter: 89.4 kg.  Code Status: full code.  DVT Prophylaxis:  Heparin.    Level of Care: Level of care: Telemetry Family Communication: family at bedside.   Disposition Plan:     Remains inpatient appropriate:  pending biopsy on Monday.   Procedures:  Echo.  Venous duplex of the lower extremities.   Consultants:   Oncology.   Antimicrobials:   Anti-infectives (From admission, onward)    None        Medications  Scheduled Meds:  loratadine  10 mg Oral Daily   Continuous Infusions:  heparin 1,650 Units/hr (11/01/23 8469)  PRN Meds:.acetaminophen, traMADol    Subjective:   Ashley Clark was seen and examined today.  No new complaints.   Objective:   Vitals:   10/31/23 0528 10/31/23 1332 10/31/23 2206 11/01/23 0623  BP: 114/80 (!) 121/91 120/75 121/68  Pulse: 75 85 94 84  Resp: 17 18 16 16   Temp: 98.5 F (36.9 C) 98.5 F (36.9 C) 98.6 F (37 C) 98 F (36.7 C)  TempSrc: Oral Oral Oral Oral  SpO2: 99% 98% 99% 100%  Weight:      Height:        Intake/Output Summary (Last 24 hours) at 11/01/2023 1739 Last data filed at 11/01/2023 1724 Gross per 24 hour  Intake 1224.17 ml  Output 400 ml  Net 824.17 ml   Filed Weights   10/29/23 1231  Weight: 89.4 kg     Exam General exam: Appears calm and comfortable  Respiratory system: Clear to auscultation. Respiratory  effort normal. Cardiovascular system: S1 & S2 heard, RRR. No JVD,  Gastrointestinal system: Abdomen is nondistended, soft and nontender.  Central nervous system: Alert and oriented. No focal neurological deficits. Extremities: Symmetric 5 x 5 power. Skin: No rashes,  Psychiatry: Mood & affect appropriate.    Data Reviewed:  I have personally reviewed following labs and imaging studies   CBC Lab Results  Component Value Date   WBC 7.8 11/01/2023   RBC 5.02 11/01/2023   HGB 12.9 11/01/2023   HCT 40.8 11/01/2023   MCV 81.3 11/01/2023   MCH 25.7 (L) 11/01/2023   PLT 308 11/01/2023   MCHC 31.6 11/01/2023   RDW 13.9 11/01/2023   LYMPHSABS 1.7 09/27/2018   MONOABS 0.6 09/27/2018   EOSABS 0.1 09/27/2018   BASOSABS 0.0 09/27/2018     Last metabolic panel Lab Results  Component Value Date   NA 134 (L) 10/30/2023   K 3.7 10/30/2023   CL 97 (L) 10/30/2023   CO2 24 10/30/2023   BUN 6 (L) 10/30/2023   CREATININE 0.31 (L) 10/30/2023   GLUCOSE 129 (H) 10/30/2023   GFRNONAA >60 10/30/2023   CALCIUM 8.7 (L) 10/30/2023   PROT 7.9 10/29/2023   ALBUMIN 3.8 10/29/2023   LABGLOB 3.0 06/10/2022   AGRATIO 1.5 06/10/2022   BILITOT 0.8 10/29/2023   ALKPHOS 216 (H) 10/29/2023   AST 31 10/29/2023   ALT 20 10/29/2023   ANIONGAP 13 10/30/2023    CBG (last 3)  No results for input(s): "GLUCAP" in the last 72 hours.    Coagulation Profile: No results for input(s): "INR", "PROTIME" in the last 168 hours.   Radiology Studies: No results found.     Kathlen Mody M.D. [facility] 11/01/2023, 5:39 PM  Available via Epic secure chat 7am-7pm After 7 pm, please refer to night coverage provider listed on amion.

## 2023-11-02 ENCOUNTER — Inpatient Hospital Stay (HOSPITAL_COMMUNITY): Payer: 59

## 2023-11-02 DIAGNOSIS — I2699 Other pulmonary embolism without acute cor pulmonale: Secondary | ICD-10-CM | POA: Diagnosis not present

## 2023-11-02 DIAGNOSIS — K8689 Other specified diseases of pancreas: Secondary | ICD-10-CM | POA: Diagnosis not present

## 2023-11-02 DIAGNOSIS — K769 Liver disease, unspecified: Secondary | ICD-10-CM | POA: Diagnosis not present

## 2023-11-02 DIAGNOSIS — E1159 Type 2 diabetes mellitus with other circulatory complications: Secondary | ICD-10-CM | POA: Diagnosis not present

## 2023-11-02 LAB — PROTIME-INR
INR: 1.2 (ref 0.8–1.2)
Prothrombin Time: 15.1 s (ref 11.4–15.2)

## 2023-11-02 LAB — CBC
HCT: 37.8 % (ref 36.0–46.0)
Hemoglobin: 12.2 g/dL (ref 12.0–15.0)
MCH: 25.6 pg — ABNORMAL LOW (ref 26.0–34.0)
MCHC: 32.3 g/dL (ref 30.0–36.0)
MCV: 79.4 fL — ABNORMAL LOW (ref 80.0–100.0)
Platelets: 331 10*3/uL (ref 150–400)
RBC: 4.76 MIL/uL (ref 3.87–5.11)
RDW: 14 % (ref 11.5–15.5)
WBC: 7.6 10*3/uL (ref 4.0–10.5)
nRBC: 0 % (ref 0.0–0.2)

## 2023-11-02 LAB — BASIC METABOLIC PANEL
Anion gap: 14 (ref 5–15)
BUN: 8 mg/dL (ref 8–23)
CO2: 23 mmol/L (ref 22–32)
Calcium: 8.8 mg/dL — ABNORMAL LOW (ref 8.9–10.3)
Chloride: 95 mmol/L — ABNORMAL LOW (ref 98–111)
Creatinine, Ser: 0.58 mg/dL (ref 0.44–1.00)
GFR, Estimated: >60 mL/min (ref >60)
Glucose, Bld: 114 mg/dL — ABNORMAL HIGH (ref 70–99)
Potassium: 3.5 mmol/L (ref 3.5–5.1)
Sodium: 132 mmol/L — ABNORMAL LOW (ref 135–145)

## 2023-11-02 LAB — CA 125: Cancer Antigen (CA) 125: 3877 U/mL — ABNORMAL HIGH (ref 0.0–38.1)

## 2023-11-02 LAB — HEPARIN LEVEL (UNFRACTIONATED): Heparin Unfractionated: 0.26 [IU]/mL — ABNORMAL LOW (ref 0.30–0.70)

## 2023-11-02 LAB — CANCER ANTIGEN 19-9: CA 19-9: 24396 U/mL — ABNORMAL HIGH (ref 0–35)

## 2023-11-02 MED ORDER — FENTANYL CITRATE (PF) 100 MCG/2ML IJ SOLN
INTRAMUSCULAR | Status: AC
  Filled 2023-11-02: qty 2

## 2023-11-02 MED ORDER — GELATIN ABSORBABLE 12-7 MM EX MISC
CUTANEOUS | Status: AC
  Filled 2023-11-02: qty 1

## 2023-11-02 MED ORDER — MIDAZOLAM HCL 2 MG/2ML IJ SOLN
INTRAMUSCULAR | Status: AC | PRN
  Administered 2023-11-02 (×2): 1 mg via INTRAVENOUS

## 2023-11-02 MED ORDER — LIDOCAINE HCL 1 % IJ SOLN
10.0000 mL | Freq: Once | INTRAMUSCULAR | Status: DC
  Filled 2023-11-02: qty 10

## 2023-11-02 MED ORDER — MIDAZOLAM HCL 2 MG/2ML IJ SOLN
INTRAMUSCULAR | Status: AC
  Filled 2023-11-02: qty 2

## 2023-11-02 MED ORDER — FENTANYL CITRATE (PF) 100 MCG/2ML IJ SOLN
INTRAMUSCULAR | Status: AC | PRN
  Administered 2023-11-02 (×2): 50 ug via INTRAVENOUS

## 2023-11-02 MED ORDER — GELATIN ABSORBABLE 12-7 MM EX MISC
1.0000 | Freq: Once | CUTANEOUS | Status: DC
  Filled 2023-11-02: qty 1

## 2023-11-02 MED ORDER — LIDOCAINE HCL 1 % IJ SOLN
INTRAMUSCULAR | Status: AC
  Filled 2023-11-02: qty 20

## 2023-11-02 MED ORDER — HEPARIN (PORCINE) 25000 UT/250ML-% IV SOLN
1800.0000 [IU]/h | INTRAVENOUS | Status: DC
  Administered 2023-11-02: 1650 [IU]/h via INTRAVENOUS
  Filled 2023-11-02: qty 250

## 2023-11-02 NOTE — Progress Notes (Signed)
[facility]                                                                               Ashley Clark, is a 62 y.o. female, DOB - 1961/03/16, OZH:[id number] Admit date - 10/29/2023    Outpatient Primary MD for the patient is [name], [place], [name]  LOS - 4  days    Brief summary   Ashley Clark is a 62 y.o. female with medical history significant of left breast cancer diagnosed in 2019 s/p left lumpectomy, radiation, on current antiestrogen therapy, hypertension, type 2 diabetes who presented to ED with epigastric and chest pain.   CTA chest/abdomen/pelvis was obtained revealing small volume occlusive thrombus in the distal segment/proximal subsegmental pulmonary RV of the right lower lobe. No right heart strain or lung infarct. However there was a new ill-defined lesion in the pancreatic neck/proximal body favoring neoplasm. There are also ill-defined lesions in the liver concerning for metastasis.    Assessment & Plan    Assessment and Plan: * Pancreatic mass Liver lesions  -on CT abd/pelvis there is an ill-defined 2.0 x 2.9 cm hypoattenuating lesion in the pancreatic neck/proximal body which is favored neoplastic. There are multiple ill-defined liver masses, compatible with metastases. Pain control. IR consulted and plan for biopsy on Monday at 12 pm.  She will follow up with Dr Truett Perna or Dr Mosetta Putt on discharge with results.  Patient requesting for port A cath placement today, oncology has approved. Will transition to Eliquis on discharge.     Obesity (BMI 30-39.9) Body mass index is 36.62 kg/m.   Acute pulmonary embolism (HCC) -provoked in the setting of new malignancy - echocardiogram does not show any right heart strain.  - venous duplex of the lower extremities are negative for DVT.  Pt reports breathing better, no chest pain.    Hypertension associated with type 2 diabetes mellitus (HCC) Optimal BP parameters.   Diabetes mellitus (HCC) - No  recent A1c.  Last one a year ago was around 6.9. -no hyperglycemia on presentation  CBG (last 3)  No results for input(s): "GLUCAP" in the last 72 hours.   Malignant neoplasm of overlapping sites of left breast in female, estrogen receptor positive (HCC) - S/p left lumpectomy, radiation, on current antiestrogen therapy with letrozole - Follows with primary oncologist Dr. Pamelia Hoit - Recently had MRI mammogram on 06/2023 that was benign  Mild hyponatremia Asymptomatic.    Estimated body mass index is 36.62 kg/m as calculated from the following:   Height as of this encounter: 5' 1.5" (1.562 m).   Weight as of this encounter: 89.4 kg.  Code Status: full code.  DVT Prophylaxis:  Heparin.    Level of Care: Level of care: Telemetry Family Communication: family at bedside.   Disposition Plan:     Remains inpatient appropriate:  pending biopsy on Monday.   Procedures:  Echo.  Venous duplex of the lower extremities.   Consultants:   Oncology.   Antimicrobials:   Anti-infectives (From admission, onward)    None        Medications  Scheduled Meds:  loratadine  10 mg Oral Daily   Continuous Infusions:  PRN Meds:.acetaminophen, traMADol    Subjective:   [name] was seen and examined today.  No new complaints.   Objective:   Vitals:   10/31/23 2206 11/01/23 0623 11/01/23 2053 11/02/23 0551  BP: 120/75 121/68 96/72 101/65  Pulse: 94 84 83 72  Resp: 16 16 18 18   Temp: 98.6 F (37 C) 98 F (36.7 C) 99.9 F (37.7 C) 98.4 F (36.9 C)  TempSrc: Oral Oral Oral Oral  SpO2: 99% 100% 100% 99%  Weight:      Height:        Intake/Output Summary (Last 24 hours) at 11/02/2023 1313 Last data filed at 11/02/2023 1000 Gross per 24 hour  Intake 687.13 ml  Output 1500 ml  Net -812.87 ml   Filed Weights   10/29/23 1231  Weight: 89.4 kg     Exam General exam: Appears calm and comfortable  Respiratory system: Clear to auscultation. Respiratory effort  normal. Cardiovascular system: S1 & S2 heard, RRR. No JVD, Gastrointestinal system: Abdomen is nondistended, soft and nontender.  Central nervous system: Alert and oriented.  Extremities: Symmetric 5 x 5 power. Skin: No rashes, lesions or ulcers Psychiatry: Mood & affect appropriate.     Data Reviewed:  I have personally reviewed following labs and imaging studies   CBC Lab Results  Component Value Date   WBC 7.6 11/02/2023   RBC 4.76 11/02/2023   HGB 12.2 11/02/2023   HCT 37.8 11/02/2023   MCV 79.4 (L) 11/02/2023   MCH 25.6 (L) 11/02/2023   PLT 331 11/02/2023   MCHC 32.3 11/02/2023   RDW 14.0 11/02/2023   LYMPHSABS 1.7 09/27/2018   MONOABS 0.6 09/27/2018   EOSABS 0.1 09/27/2018   BASOSABS 0.0 09/27/2018     Last metabolic panel Lab Results  Component Value Date   NA 132 (L) 11/02/2023   K 3.5 11/02/2023   CL 95 (L) 11/02/2023   CO2 23 11/02/2023   BUN 8 11/02/2023   CREATININE 0.58 11/02/2023   GLUCOSE 114 (H) 11/02/2023   GFRNONAA >60 11/02/2023   CALCIUM 8.8 (L) 11/02/2023   PROT 7.9 10/29/2023   ALBUMIN 3.8 10/29/2023   LABGLOB 3.0 06/10/2022   AGRATIO 1.5 06/10/2022   BILITOT 0.8 10/29/2023   ALKPHOS 216 (H) 10/29/2023   AST 31 10/29/2023   ALT 20 10/29/2023   ANIONGAP 14 11/02/2023    CBG (last 3)  No results for input(s): "GLUCAP" in the last 72 hours.    Coagulation Profile: Recent Labs  Lab 11/02/23 0415  INR 1.2     Radiology Studies: No results found.     Kathlen Mody M.D. [facility] 11/02/2023, 1:13 PM  Available via Epic secure chat 7am-7pm After 7 pm, please refer to night coverage provider listed on amion.

## 2023-11-02 NOTE — Plan of Care (Signed)

## 2023-11-02 NOTE — Progress Notes (Signed)
PHARMACY - ANTICOAGULATION CONSULT NOTE  Pharmacy Consult for heparin Indication: pulmonary embolus  No Known Allergies  Patient Measurements: Height: 5' 1.5" (156.2 cm) Weight: 89.4 kg (197 lb) IBW/kg (Calculated) : 48.95 Heparin Dosing Weight: 69.6kg  Vital Signs: Temp: 98.2 F (36.8 C) (11/04 1500) Temp Source: Oral (11/04 1353) BP: 104/68 (11/04 1500) Pulse Rate: 78 (11/04 1500)  Labs: Recent Labs    10/31/23 0255 10/31/23 0856 11/01/23 0446 11/02/23 0415 11/02/23 0419  HGB 12.8  --  12.9 12.2  --   HCT 39.9  --  40.8 37.8  --   PLT 298  --  308 331  --   LABPROT  --   --   --  15.1  --   INR  --   --   --  1.2  --   HEPARINUNFRC 0.40 0.45 0.33  --  0.26*  CREATININE  --   --   --   --  0.58    Estimated Creatinine Clearance: 76 mL/min (by C-G formula based on SCr of 0.58 mg/dL).   Medical History: Past Medical History:  Diagnosis Date   Allergy    Back pain    Cancer (HCC) 2019   left breast cancer-last radiation Dec.2019   Diabetes mellitus without complication (HCC)    Medical history non-contributory    Personal history of radiation therapy    Prediabetes    Tiredness    Vitamin D deficiency    Assessment: 62YOF with PMH of breast cancer on letrozole presented with chest tightness and shortness of breath. CTA chest positive small volume occlusive thrombus in the distal segmental/proximal subsegmental pulmonary artery in the right lung lower lobe. CT abd/pelvis with pancreatic mass with liver lesions concerning for metastases.  Pharmacy consulted to manage heparin.  Today: -Heparin level is therapeutic at 0.33, on 1650 units/hr. -Hgb 12.9, plt 308--stable. -No line issues or signs/symptoms of bleeding reported. -Per IR note, heparin to be held at 0600 day of procedure. -Liver biopsy completed 14:45    Goal of Therapy:  Heparin level 0.3-0.7 units/ml Monitor platelets by anticoagulation protocol: Yes   Plan:  -Per radiologist, restart IV  heparin 1650 units/hr infusion at 1830 -6 hr heparin level after restart of infusion -Daily heparin level -Continue to monitor CBC and signs/symptoms of bleeding -Follow up transition to oral anticoagulation once appropriate   Thank you for allowing pharmacy to be a part of this patient's care.  Selinda Eon, PharmD, BCPS Clinical Pharmacist [place] 11/02/2023 4:41 PM

## 2023-11-02 NOTE — Procedures (Signed)
Interventional Radiology Procedure Note  Procedure: US Guided Biopsy of liver lesion  Complications: None  Estimated Blood Loss: < 10 mL  Findings: 18 G core biopsy of left lobe liver lesion performed under US guidance.  Three core samples obtained and sent to Pathology.  Venetia Night. Kathlene Cote, M.D Pager:  [phone]

## 2023-11-03 ENCOUNTER — Encounter: Payer: Self-pay | Admitting: Hematology

## 2023-11-03 ENCOUNTER — Inpatient Hospital Stay (HOSPITAL_COMMUNITY): Payer: 59

## 2023-11-03 ENCOUNTER — Other Ambulatory Visit (HOSPITAL_COMMUNITY): Payer: Self-pay

## 2023-11-03 DIAGNOSIS — C259 Malignant neoplasm of pancreas, unspecified: Secondary | ICD-10-CM | POA: Diagnosis not present

## 2023-11-03 DIAGNOSIS — K8689 Other specified diseases of pancreas: Secondary | ICD-10-CM | POA: Diagnosis not present

## 2023-11-03 DIAGNOSIS — I2699 Other pulmonary embolism without acute cor pulmonale: Secondary | ICD-10-CM | POA: Diagnosis not present

## 2023-11-03 DIAGNOSIS — E669 Obesity, unspecified: Secondary | ICD-10-CM | POA: Diagnosis not present

## 2023-11-03 HISTORY — PX: IR IMAGING GUIDED PORT INSERTION: IMG5740

## 2023-11-03 LAB — CBC
HCT: 38.3 % (ref 36.0–46.0)
Hemoglobin: 13 g/dL (ref 12.0–15.0)
MCH: 26.6 pg (ref 26.0–34.0)
MCHC: 33.9 g/dL (ref 30.0–36.0)
MCV: 78.5 fL — ABNORMAL LOW (ref 80.0–100.0)
Platelets: 296 10*3/uL (ref 150–400)
RBC: 4.88 MIL/uL (ref 3.87–5.11)
RDW: 13.9 % (ref 11.5–15.5)
WBC: 9.2 10*3/uL (ref 4.0–10.5)
nRBC: 0 % (ref 0.0–0.2)

## 2023-11-03 LAB — HEPARIN LEVEL (UNFRACTIONATED)
Heparin Unfractionated: 0.23 [IU]/mL — ABNORMAL LOW (ref 0.30–0.70)
Heparin Unfractionated: 0.37 [IU]/mL (ref 0.30–0.70)

## 2023-11-03 LAB — SURGICAL PATHOLOGY

## 2023-11-03 MED ORDER — APIXABAN 5 MG PO TABS: 5.0000 mg | ORAL_TABLET | Freq: Two times a day (BID) | ORAL | Status: DC

## 2023-11-03 MED ORDER — PROCHLORPERAZINE MALEATE 10 MG PO TABS
10.0000 mg | ORAL_TABLET | Freq: Four times a day (QID) | ORAL | 1 refills | Status: DC | PRN
  Filled 2023-11-03 – 2023-11-09 (×2): qty 30, 8d supply, fill #0

## 2023-11-03 MED ORDER — FENTANYL CITRATE (PF) 100 MCG/2ML IJ SOLN
INTRAMUSCULAR | Status: AC | PRN
  Administered 2023-11-03: 50 ug via INTRAVENOUS
  Administered 2023-11-03: 25 ug via INTRAVENOUS

## 2023-11-03 MED ORDER — LIDOCAINE-EPINEPHRINE 1 %-1:100000 IJ SOLN
20.0000 mL | Freq: Once | INTRAMUSCULAR | Status: DC
  Filled 2023-11-03: qty 20

## 2023-11-03 MED ORDER — APIXABAN 5 MG PO TABS
10.0000 mg | ORAL_TABLET | Freq: Two times a day (BID) | ORAL | Status: DC
  Administered 2023-11-03: 10 mg via ORAL
  Filled 2023-11-03: qty 2

## 2023-11-03 MED ORDER — DIPHENHYDRAMINE HCL 50 MG/ML IJ SOLN
INTRAMUSCULAR | Status: AC | PRN
  Administered 2023-11-03: 25 mg via INTRAVENOUS

## 2023-11-03 MED ORDER — FENTANYL CITRATE (PF) 100 MCG/2ML IJ SOLN
INTRAMUSCULAR | Status: AC
  Filled 2023-11-03: qty 2

## 2023-11-03 MED ORDER — ONDANSETRON HCL 8 MG PO TABS
8.0000 mg | ORAL_TABLET | Freq: Three times a day (TID) | ORAL | 1 refills | Status: DC | PRN
  Filled 2023-11-03 – 2023-11-09 (×2): qty 30, 10d supply, fill #0

## 2023-11-03 MED ORDER — MIDAZOLAM HCL 2 MG/2ML IJ SOLN
INTRAMUSCULAR | Status: AC | PRN
  Administered 2023-11-03: .5 mg via INTRAVENOUS
  Administered 2023-11-03: 1 mg via INTRAVENOUS

## 2023-11-03 MED ORDER — DIPHENHYDRAMINE HCL 50 MG/ML IJ SOLN
INTRAMUSCULAR | Status: AC
  Filled 2023-11-03: qty 1

## 2023-11-03 MED ORDER — MIDAZOLAM HCL 2 MG/2ML IJ SOLN
INTRAMUSCULAR | Status: AC
  Filled 2023-11-03: qty 2

## 2023-11-03 MED ORDER — LIDOCAINE-PRILOCAINE 2.5-2.5 % EX CREA
TOPICAL_CREAM | CUTANEOUS | 3 refills | Status: DC
  Filled 2023-11-03: qty 30, 15d supply, fill #0
  Filled 2023-11-09: qty 30, 30d supply, fill #0

## 2023-11-03 MED ORDER — APIXABAN (ELIQUIS) VTE STARTER PACK (10MG AND 5MG)
ORAL_TABLET | ORAL | 0 refills | Status: DC
  Filled 2023-11-03: qty 74, 30d supply, fill #0

## 2023-11-03 MED ORDER — LIDOCAINE-EPINEPHRINE 1 %-1:100000 IJ SOLN
INTRAMUSCULAR | Status: AC
  Filled 2023-11-03: qty 1

## 2023-11-03 NOTE — Discharge Instructions (Signed)
Information on my medicine - ELIQUIS (apixaban)  This medication education was reviewed with me or my healthcare representative as part of my discharge preparation.  The pharmacist that spoke with me during my hospital stay was:  Herby Abraham, [facility]  Why was Eliquis prescribed for you? Eliquis was prescribed to treat blood clots that may have been found in the veins of your legs (deep vein thrombosis) or in your lungs (pulmonary embolism) and to reduce the risk of them occurring again.  What do You need to know about Eliquis ? The starting dose is 10 mg (two 5 mg tablets) taken TWICE daily for the FIRST SEVEN (7) DAYS, then on  11/09/2023 the dose is reduced to ONE 5 mg tablet taken TWICE daily.  Eliquis may be taken with or without food.   Try to take the dose about the same time in the morning and in the evening. If you have difficulty swallowing the tablet whole please discuss with your pharmacist how to take the medication safely.  Take Eliquis exactly as prescribed and DO NOT stop taking Eliquis without talking to the doctor who prescribed the medication.  Stopping may increase your risk of developing a new blood clot.  Refill your prescription before you run out.  After discharge, you should have regular check-up appointments with your healthcare provider that is prescribing your Eliquis.    What do you do if you miss a dose? If a dose of ELIQUIS is not taken at the scheduled time, take it as soon as possible on the same day and twice-daily administration should be resumed. The dose should not be doubled to make up for a missed dose.  Important Safety Information A possible side effect of Eliquis is bleeding. You should call your healthcare provider right away if you experience any of the following: Bleeding from an injury or your nose that does not stop. Unusual colored urine (red or dark brown) or unusual colored stools (red or black). Unusual bruising for unknown  reasons. A serious fall or if you hit your head (even if there is no bleeding).  Some medicines may interact with Eliquis and might increase your risk of bleeding or clotting while on Eliquis. To help avoid this, consult your healthcare provider or pharmacist prior to using any new prescription or non-prescription medications, including herbals, vitamins, non-steroidal anti-inflammatory drugs (NSAIDs) and supplements.  This website has more information on Eliquis (apixaban): http://www.eliquis.com/eliquis/home

## 2023-11-03 NOTE — Discharge Summary (Signed)
Physician Discharge Summary   Patient: Ashley Clark MRN: [id number] DOB: [birth year]  Admit date:     10/29/2023  Discharge date: 11/03/23  Discharge Physician: Kathlen Mody   PCP: [name], PA   Recommendations at discharge:  Please follow up with Dr Mosetta Putt as recommended.   Discharge Diagnoses: Principal Problem:   Pancreatic mass Active Problems:   Malignant neoplasm of overlapping sites of left breast in female, estrogen receptor positive (HCC)   Diabetes mellitus (HCC)   Hypertension associated with type 2 diabetes mellitus (HCC)   Liver lesion   Acute pulmonary embolism (HCC)   Obesity (BMI 30-39.9)  Resolved Problems:   * No resolved hospital problems. *  Hospital Course:    Ashley Clark is a 62 y.o. female with medical history significant of left breast cancer diagnosed in 2019 s/p left lumpectomy, radiation, on current antiestrogen therapy, hypertension, type 2 diabetes who presented to ED with epigastric and chest pain.    CTA chest/abdomen/pelvis was obtained revealing small volume occlusive thrombus in the distal segment/proximal subsegmental pulmonary RV of the right lower lobe. No right heart strain or lung infarct. However there was a new ill-defined lesion in the pancreatic neck/proximal body favoring neoplasm. There are also ill-defined lesions in the liver concerning for metastasis.   Assessment and Plan:  Pancreatic mass Liver lesions  -on CT abd/pelvis there is an ill-defined 2.0 x 2.9 cm hypoattenuating lesion in the pancreatic neck/proximal body which is favored neoplastic. There are multiple ill-defined liver masses, compatible with metastases. Pain control. IR consulted and plan for biopsy on Monday. Recommend outpatient follow up with Dr Mosetta Putt for the results.  She will follow up with  Dr Mosetta Putt on discharge with results.  Patient requesting for port A cath placement prior to discharge, oncology has approved. Will transition to Eliquis on  discharge.        Obesity (BMI 30-39.9) Body mass index is 36.62 kg/m.     Acute pulmonary embolism (HCC) -provoked in the setting of new malignancy - echocardiogram does not show any right heart strain.  - venous duplex of the lower extremities are negative for DVT.  Pt reports breathing better, no chest pain.  - she was discharged on oral eliquis .     Hypertension associated with type 2 diabetes mellitus (HCC) Optimal BP parameters.    Diabetes mellitus (HCC) Last A1c is 6.8%.  Recommend outpatient follow up with PCP for a repeat A1c.       Malignant neoplasm of overlapping sites of left breast in female, estrogen receptor positive (HCC) - S/p left lumpectomy, radiation, on current antiestrogen therapy with letrozole - Follows with primary oncologist Dr. Pamelia Hoit - Recently had MRI mammogram on 06/2023 that was benign   Mild hyponatremia Asymptomatic.      Estimated body mass index is 36.62 kg/m as calculated from the following:   Height as of this encounter: 5' 1.5" (1.562 m).   Weight as of this encounter: 89.4 kg. Consultants: oncology IR Procedures performed: liver biopsy  Disposition: Home Diet recommendation:  Discharge Diet Orders (From admission, onward)     Start     Ordered   11/03/23 0000  Diet - low sodium heart healthy        11/03/23 1702           Carb modified diet DISCHARGE MEDICATION: Allergies as of 11/03/2023   No Known Allergies      Medication List     STOP taking these medications  atorvastatin 10 MG tablet Commonly known as: LIPITOR   Fluarix Quadrivalent 0.5 ML injection Generic drug: influenza vac split quadrivalent PF   ibuprofen 800 MG tablet Commonly known as: ADVIL   letrozole 2.5 MG tablet Commonly known as: [employer]   Pfizer COVID-19 Vac Bivalent injection Generic drug: COVID-19 mRNA bivalent vaccine Proofreader)       TAKE these medications    cetirizine 10 MG chewable tablet Commonly known as:  ZYRTEC Chew 10 mg by mouth daily.   Eliquis DVT/PE Starter Pack Generic drug: Apixaban Starter Pack (10mg  and 5mg ) Take as directed on package: start with two-5mg  tablets twice daily for 7 days. On day 8, switch to one-5mg  tablet twice daily.   losartan 25 MG tablet Commonly known as: COZAAR Take 1 tablet (25 mg total) by mouth daily for kidney protection. What changed: Another medication with the same name was removed. Continue taking this medication, and follow the directions you see here.        Discharge Exam: Filed Weights   10/29/23 1231  Weight: 89.4 kg   General exam: Appears calm and comfortable  Respiratory system: Clear to auscultation. Respiratory effort normal. Cardiovascular system: S1 & S2 heard, RRR.  Gastrointestinal system: Abdomen is nondistended, soft and nontender.  Central nervous system: Alert and oriented. No focal neurological deficits. Extremities: Symmetric 5 x 5 power. Skin: No rashes,  Psychiatry: Mood & affect appropriate.    Condition at discharge: fair  The results of significant diagnostics from this hospitalization (including imaging, microbiology, ancillary and laboratory) are listed below for reference.   Imaging Studies: IR IMAGING GUIDED PORT INSERTION  Result Date: 11/03/2023 CLINICAL DATA:  Metastatic pancreas cancer, access for chemotherapy EXAM: RIGHT INTERNAL JUGULAR SINGLE LUMEN POWER PORT CATHETER INSERTION Date:  11/03/2023 11/03/2023 12:27 pm Radiologist:  Judie Petit. Ruel Favors, MD Guidance:  Ultrasound and fluoroscopic MEDICATIONS: 1% lidocaine local with epinephrine ANESTHESIA/SEDATION: Versed 1.5 mg IV; Fentanyl 75 mcg IV; Moderate Sedation Time:  27 minutes The patient was continuously monitored during the procedure by the interventional radiology nurse under my direct supervision. FLUOROSCOPY: 0 minutes, 39 seconds (2.0 mGy) COMPLICATIONS: None immediate. CONTRAST:  None. PROCEDURE: Informed consent was obtained from the patient  following explanation of the procedure, risks, benefits and alternatives. The patient understands, agrees and consents for the procedure. All questions were addressed. A time out was performed. Maximal barrier sterile technique utilized including caps, mask, sterile gowns, sterile gloves, large sterile drape, hand hygiene, and 2% chlorhexidine scrub. Under sterile conditions and local anesthesia, right internal jugular micropuncture venous access was performed. Access was performed with ultrasound. Images were obtained for documentation of the patent right internal jugular vein. A guide wire was inserted followed by a transitional dilator. This allowed insertion of a guide wire and catheter into the IVC. Measurements were obtained from the SVC / RA junction back to the right IJ venotomy site. In the right infraclavicular chest, a subcutaneous pocket was created over the second anterior rib. This was done under sterile conditions and local anesthesia. 1% lidocaine with epinephrine was utilized for this. A 2.5 cm incision was made in the skin. Blunt dissection was performed to create a subcutaneous pocket over the right pectoralis major muscle. The pocket was flushed with saline vigorously. There was adequate hemostasis. The port catheter was assembled and checked for leakage. The port catheter was secured in the pocket with two retention sutures. The tubing was tunneled subcutaneously to the right venotomy site and inserted into the SVC/RA junction through  a valved peel-away sheath. Position was confirmed with fluoroscopy. Images were obtained for documentation. The patient tolerated the procedure well. No immediate complications. Incisions were closed in a two layer fashion with 4 - 0 Vicryl suture. Dermabond was applied to the skin. The port catheter was accessed, blood was aspirated followed by saline and heparin flushes. Needle was removed. A dry sterile dressing was applied. IMPRESSION: Ultrasound and  fluoroscopically guided right internal jugular single lumen power port catheter insertion. Tip in the SVC/RA junction. Catheter ready for use. Electronically Signed   By: Judie Petit.  Shick M.D.   On: 11/03/2023 12:37   US BIOPSY (LIVER)  Result Date: 11/02/2023 INDICATION: Pancreatic mass and multiple liver lesions. EXAM: ULTRASOUND GUIDED CORE BIOPSY OF LIVER MASS MEDICATIONS: None. ANESTHESIA/SEDATION: Moderate (conscious) sedation was employed during this procedure. A total of Versed 2.0 mg and Fentanyl 100 mcg was administered intravenously. Moderate Sedation Time: 10 minutes. The patient's level of consciousness and vital signs were monitored continuously by radiology nursing throughout the procedure under my direct supervision. PROCEDURE: The procedure, risks, benefits, and alternatives were explained to the patient. Questions regarding the procedure were encouraged and answered. The patient understands and consents to the procedure. A time-out was performed prior to initiating the procedure. Liver lesions were localized by ultrasound. The abdominal wall was prepped with chlorhexidine in a sterile fashion, and a sterile drape was applied covering the operative field. A sterile gown and sterile gloves were used for the procedure. Local anesthesia was provided with 1% Lidocaine. Under ultrasound guidance, a 17 gauge trocar needle was advanced into a lesion within the left lobe of the liver. After confirming needle tip position, coaxial 18 gauge core biopsy samples were obtained. Three separate core biopsy samples were obtained and submitted in formalin. Gel-Foam pledgets were advanced through the outer needle prior to its retraction and removal. Additional ultrasound was performed. COMPLICATIONS: None immediate. FINDINGS: Multiple mass lesions are seen throughout the liver. The largest and most accessible was in the anterior subcapsular left lobe measuring approximately 3.1 x 3.6 cm in greatest diameter. Solid core  biopsy samples were obtained. IMPRESSION: Ultrasound-guided core biopsy performed of a lesion within the left lobe of the liver measuring approximately 3.6 cm in greatest diameter. Electronically Signed   By: Irish Lack M.D.   On: 11/02/2023 15:19   VAS [place] LOWER EXTREMITY VENOUS (DVT)  Result Date: 10/31/2023  Lower Venous DVT Study Patient Name:  Ashley Clark  Date of Exam:   10/30/2023 Medical Rec #: [id number]         Accession #:    [id number] Date of Birth: Jun 25, 1961         Patient Gender: F Patient Age:   66 years Exam Location:  [facility] Procedure:      VAS [place] LOWER EXTREMITY VENOUS (DVT) Referring Phys: Kathlen Mody --------------------------------------------------------------------------------  Indications: Pulmonary embolism.  Risk Factors: Hx of cancer, newly diagnosed pancreatic and liver lesions. Comparison Study: No previous exams Performing Technologist: Jody Hill RVT, RDMS  Examination Guidelines: A complete evaluation includes B-mode imaging, spectral Doppler, color Doppler, and power Doppler as needed of all accessible portions of each vessel. Bilateral testing is considered an integral part of a complete examination. Limited examinations for reoccurring indications may be performed as noted. The reflux portion of the exam is performed with the patient in reverse Trendelenburg.  +---------+---------------+---------+-----------+----------+--------------+ RIGHT    CompressibilityPhasicitySpontaneityPropertiesThrombus Aging +---------+---------------+---------+-----------+----------+--------------+ CFV      Full  Yes      Yes                                 +---------+---------------+---------+-----------+----------+--------------+ SFJ      Full                                                        +---------+---------------+---------+-----------+----------+--------------+ FV Prox  Full           Yes      Yes                                  +---------+---------------+---------+-----------+----------+--------------+ FV Mid   Full           Yes      Yes                                 +---------+---------------+---------+-----------+----------+--------------+ FV DistalFull           Yes      Yes                                 +---------+---------------+---------+-----------+----------+--------------+ PFV      Full                                                        +---------+---------------+---------+-----------+----------+--------------+ POP      Full           Yes      Yes                                 +---------+---------------+---------+-----------+----------+--------------+ PTV      Full                                                        +---------+---------------+---------+-----------+----------+--------------+ PERO     Full                                                        +---------+---------------+---------+-----------+----------+--------------+   +---------+---------------+---------+-----------+----------+--------------+ LEFT     CompressibilityPhasicitySpontaneityPropertiesThrombus Aging +---------+---------------+---------+-----------+----------+--------------+ CFV      Full           Yes      Yes                                 +---------+---------------+---------+-----------+----------+--------------+ SFJ      Full                                                        +---------+---------------+---------+-----------+----------+--------------+  FV Prox  Full           Yes      Yes                                 +---------+---------------+---------+-----------+----------+--------------+ FV Mid   Full           Yes      Yes                                 +---------+---------------+---------+-----------+----------+--------------+ FV DistalFull           Yes      Yes                                  +---------+---------------+---------+-----------+----------+--------------+ PFV      Full                                                        +---------+---------------+---------+-----------+----------+--------------+ POP      Full           Yes      Yes                                 +---------+---------------+---------+-----------+----------+--------------+ PTV      Full                                                        +---------+---------------+---------+-----------+----------+--------------+ PERO     Full                                                        +---------+---------------+---------+-----------+----------+--------------+     Summary: BILATERAL: - No evidence of deep vein thrombosis seen in the lower extremities, bilaterally. -No evidence of popliteal cyst, bilaterally.   *See table(s) above for measurements and observations. Electronically signed by Coral Else MD on 10/31/2023 at 10:49:39 AM.    Final    ECHOCARDIOGRAM COMPLETE  Result Date: 10/30/2023    ECHOCARDIOGRAM REPORT   Patient Name:   Ashley Clark Date of Exam: 10/30/2023 Medical Rec #:  [id number]        Height:       61.5 in Accession #:    [id number]       Weight:       197.0 lb Date of Birth:  [birth year]        BSA:          1.888 m Patient Age:    62 years         BP:           116/75 mmHg Patient Gender: F                HR:  69 bpm. Exam Location:  Inpatient Procedure: 2D Echo, Color Doppler, Cardiac Doppler and Intracardiac            Opacification Agent Indications:    Pulmonary embolus  History:        Patient has no prior history of Echocardiogram examinations.                 Risk Factors:Diabetes and Hypertension. CA, hx of radiation                 therapy.  Sonographer:    Milda Smart Referring Phys: Herminio Heads  Sonographer Comments: Image acquisition challenging due to patient body habitus. Suboptimal images IMPRESSIONS  1. Poor acoustic windows limit study,  even with use of Definity.  2. Left ventricular ejection fraction, by estimation, is 65 to 70%. The left ventricle has normal function. The left ventricle has no regional wall motion abnormalities. Left ventricular diastolic parameters were normal.  3. Right ventricular systolic function is normal. The right ventricular size is normal.  4. The mitral valve is normal in structure. Trivial mitral valve regurgitation.  5. The aortic valve was not well visualized. Aortic valve regurgitation is not visualized.  6. The inferior vena cava is normal in size with greater than 50% respiratory variability, suggesting right atrial pressure of 3 mmHg. FINDINGS  Left Ventricle: Left ventricular ejection fraction, by estimation, is 65 to 70%. The left ventricle has normal function. The left ventricle has no regional wall motion abnormalities. Definity contrast agent was given IV to delineate the left ventricular  endocardial borders. The left ventricular internal cavity size was normal in size. There is no left ventricular hypertrophy. Left ventricular diastolic parameters were normal. Right Ventricle: The right ventricular size is normal. Right vetricular wall thickness was not assessed. Right ventricular systolic function is normal. Left Atrium: Left atrial size was normal in size. Right Atrium: Right atrial size was normal in size. Pericardium: There is no evidence of pericardial effusion. Mitral Valve: The mitral valve is normal in structure. Trivial mitral valve regurgitation. Tricuspid Valve: The tricuspid valve is normal in structure. Tricuspid valve regurgitation is trivial. Aortic Valve: The aortic valve was not well visualized. Aortic valve regurgitation is not visualized. Pulmonic Valve: The pulmonic valve was normal in structure. Pulmonic valve regurgitation is not visualized. Aorta: The aortic root and ascending aorta are structurally normal, with no evidence of dilitation. Venous: The inferior vena cava is normal in  size with greater than 50% respiratory variability, suggesting right atrial pressure of 3 mmHg. IAS/Shunts: No atrial level shunt detected by color flow Doppler.  LEFT VENTRICLE PLAX 2D LVIDd:         4.20 cm   Diastology LVIDs:         2.60 cm   LV e' medial:    7.40 cm/s LV PW:         0.90 cm   LV E/e' medial:  8.7 LV IVS:        0.80 cm   LV e' lateral:   11.70 cm/s LVOT diam:     2.00 cm   LV E/e' lateral: 5.5 LV SV:         79 LV SV Index:   42 LVOT Area:     3.14 cm  RIGHT VENTRICLE RV S prime:     14.40 cm/s TAPSE (M-mode): 2.4 cm LEFT ATRIUM             Index  RIGHT ATRIUM          Index LA diam:        2.70 cm 1.43 cm/m   RA Area:     9.08 cm LA Vol (A2C):   28.6 ml 15.15 ml/m  RA Volume:   17.70 ml 9.37 ml/m LA Vol (A4C):   23.6 ml 12.50 ml/m LA Biplane Vol: 27.2 ml 14.41 ml/m  AORTIC VALVE LVOT Vmax:   135.00 cm/s LVOT Vmean:  92.800 cm/s LVOT VTI:    0.253 m  AORTA Ao Root diam: 2.80 cm Ao Asc diam:  3.40 cm MITRAL VALVE MV Area (PHT): 2.75 cm    SHUNTS MV Decel Time: 276 msec    Systemic VTI:  0.25 m MV E velocity: 64.70 cm/s  Systemic Diam: 2.00 cm MV A velocity: 84.00 cm/s MV E/A ratio:  0.77 Dietrich Pates MD Electronically signed by Dietrich Pates MD Signature Date/Time: 10/30/2023/5:22:54 PM    Final    CT Angio Chest PE W/Cm &/Or Wo Cm  Result Date: 10/29/2023 CORONARY ARTERIES: Pulmonary embolism (PE) suspected, high prob Lower chest/epigastric pain, SOB, hx breast cancer; Abdominal pain, acute, nonlocalized Lower chest/epigastric pain, SOB, hx breast cancer EXAM: CT ANGIOGRAPHY CHEST CT ABDOMEN AND PELVIS WITH CONTRAST TECHNIQUE: Multidetector CT imaging of the chest was performed using the standard protocol during bolus administration of intravenous contrast. Multiplanar CT image reconstructions and MIPs were obtained to evaluate the vascular anatomy. Multidetector CT imaging of the abdomen and pelvis was performed using the standard protocol during bolus administration of  intravenous contrast. RADIATION DOSE REDUCTION: This exam was performed according to the departmental dose-optimization program which includes automated exposure control, adjustment of the mA and/or kV according to patient size and/or use of iterative reconstruction technique. CONTRAST:  OMNIPAQUE IOHEXOL 350 MG/ML SOLN COMPARISON:  None Available. FINDINGS: CTA CHEST FINDINGS Cardiovascular: There is a small volume occlusive thrombus in the distal segmental/proximal subsegmental pulmonary artery branch to the right lower lobe. No evidence of right heart strain no lung infarction. Mild cardiomegaly. No pericardial effusion. No aortic aneurysm. There is dilation of the main pulmonary trunk measuring up to 3.1 cm, which is nonspecific but can be seen with pulmonary artery hypertension. Mediastinum/Nodes: Visualized thyroid gland appears grossly unremarkable. No solid / cystic mediastinal masses. The esophagus is nondistended precluding optimal assessment. No axillary, mediastinal or hilar lymphadenopathy by size criteria. Lungs/Pleura: The central tracheo-bronchial tree is patent. There are dependent changes and patchy areas of linear, plate-like atelectasis and/or scarring throughout bilateral lungs. No mass or consolidation. No pleural effusion or pneumothorax. No suspicious lung nodules. Musculoskeletal: The visualized soft tissues of the chest wall are grossly unremarkable. No suspicious osseous lesions. There are mild multilevel degenerative changes in the visualized spine. Review of the MIP images confirms the above findings. CT ABDOMEN and PELVIS FINDINGS Hepatobiliary: The liver is normal in size. Non-cirrhotic configuration. There are multiple ill-defined hypoattenuating liver lesions occupying 10-20% of the total surface of liver parenchyma, compatible with metastases. The largest such lesion is in the caudate lobe measuring up to 2.8 x 3.8 cm. No intrahepatic or extrahepatic bile duct dilation. No  calcified gallstones. Normal gallbladder wall thickness. No pericholecystic inflammatory changes. Pancreas: There is an ill-defined approximately 2.0 x 2.9 cm hypoattenuating lesion centered in the pancreatic neck/proximal body region. There is subtle prominence of upstream main pancreatic duct. Pancreas is otherwise essentially unremarkable. No peripancreatic fat stranding. Spleen: Within normal limits. No focal lesion. Adrenals/Urinary Tract: Adrenal glands are unremarkable. No suspicious  renal mass. No hydronephrosis. No renal or ureteric calculi. Unremarkable urinary bladder. Stomach/Bowel: No disproportionate dilation of the small or large bowel loops. No evidence of abnormal bowel wall thickening or inflammatory changes. The appendix is unremarkable. Vascular/Lymphatic: No ascites or pneumoperitoneum. No abdominal or pelvic lymphadenopathy, by size criteria. No aneurysmal dilation of the major abdominal arteries. 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 Reproductive: Normal-sized retroverted uterus, not well evaluated on this exam. No large adnexal mass seen. Other: There is a tiny fat containing umbilical hernia. The soft tissues and abdominal wall are otherwise unremarkable. Musculoskeletal: No suspicious osseous lesions. There are mild multilevel degenerative changes in the visualized spine. Review of the MIP images confirms the above findings. IMPRESSION: 1. There is a small volume occlusive thrombus in the distal segmental/proximal subsegmental pulmonary artery the right lung lower lobe. No right heart strain no lung infarction. 2. No lung mass, consolidation, pleural effusion or pneumothorax. 3. There are multiple ill-defined liver masses, compatible with metastases. 4. There is an ill-defined 2.0 x 2.9 cm hypoattenuating lesion in the pancreatic neck/proximal body which is favored  neoplastic. Differential diagnosis includes primary pancreatic tumor with liver metastases versus metastatic pancreatic and liver lesions in patient with known history of breast carcinoma, less likely. Correlate with tumor markers and tissue sampling. 5. 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 6. Multiple other nonacute observations, as described above. Critical Value/emergent results were called by telephone at the time of interpretation on 10/29/2023 at 3:31 pm to provider [facility] DAVIS , who verbally acknowledged these results. Electronically Signed   By: Jules Schick M.D.   On: 10/29/2023 15:39   CT ABDOMEN PELVIS W CONTRAST  Result Date: 10/29/2023 CORONARY ARTERIES: Pulmonary embolism (PE) suspected, high prob Lower chest/epigastric pain, SOB, hx breast cancer; Abdominal pain, acute, nonlocalized Lower chest/epigastric pain, SOB, hx breast cancer EXAM: CT ANGIOGRAPHY CHEST CT ABDOMEN AND PELVIS WITH CONTRAST TECHNIQUE: Multidetector CT imaging of the chest was performed using the standard protocol during bolus administration of intravenous contrast. Multiplanar CT image reconstructions and MIPs were obtained to evaluate the vascular anatomy. Multidetector CT imaging of the abdomen and pelvis was performed using the standard protocol during bolus administration of intravenous contrast. RADIATION DOSE REDUCTION: This exam was performed according to the departmental dose-optimization program which includes automated exposure control, adjustment of the mA and/or kV according to patient size and/or use of iterative reconstruction technique. CONTRAST:  OMNIPAQUE IOHEXOL 350 MG/ML SOLN COMPARISON:  None Available. FINDINGS: CTA CHEST FINDINGS Cardiovascular: There is a small volume occlusive thrombus in the distal segmental/proximal subsegmental pulmonary artery branch to the right  lower lobe. No evidence of right heart strain no lung infarction. Mild cardiomegaly. No pericardial effusion. No aortic aneurysm. There is dilation of the main pulmonary trunk measuring up to 3.1 cm, which is nonspecific but can be seen with pulmonary artery hypertension. Mediastinum/Nodes: Visualized thyroid gland appears grossly unremarkable. No solid / cystic mediastinal masses. The esophagus is nondistended precluding optimal assessment. No axillary, mediastinal or hilar lymphadenopathy by size criteria. Lungs/Pleura: The central tracheo-bronchial tree is patent. There are dependent changes and patchy areas of linear, plate-like atelectasis and/or scarring throughout bilateral lungs. No mass or consolidation. No pleural effusion or pneumothorax. No suspicious lung nodules. Musculoskeletal: The visualized soft tissues of  the chest wall are grossly unremarkable. No suspicious osseous lesions. There are mild multilevel degenerative changes in the visualized spine. Review of the MIP images confirms the above findings. CT ABDOMEN and PELVIS FINDINGS Hepatobiliary: The liver is normal in size. Non-cirrhotic configuration. There are multiple ill-defined hypoattenuating liver lesions occupying 10-20% of the total surface of liver parenchyma, compatible with metastases. The largest such lesion is in the caudate lobe measuring up to 2.8 x 3.8 cm. No intrahepatic or extrahepatic bile duct dilation. No calcified gallstones. Normal gallbladder wall thickness. No pericholecystic inflammatory changes. Pancreas: There is an ill-defined approximately 2.0 x 2.9 cm hypoattenuating lesion centered in the pancreatic neck/proximal body region. There is subtle prominence of upstream main pancreatic duct. Pancreas is otherwise essentially unremarkable. No peripancreatic fat stranding. Spleen: Within normal limits. No focal lesion. Adrenals/Urinary Tract: Adrenal glands are unremarkable. No suspicious renal mass. No hydronephrosis. No  renal or ureteric calculi. Unremarkable urinary bladder. Stomach/Bowel: No disproportionate dilation of the small or large bowel loops. No evidence of abnormal bowel wall thickening or inflammatory changes. The appendix is unremarkable. Vascular/Lymphatic: No ascites or pneumoperitoneum. No abdominal or pelvic lymphadenopathy, by size criteria. No aneurysmal dilation of the major abdominal arteries. 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 Reproductive: Normal-sized retroverted uterus, not well evaluated on this exam. No large adnexal mass seen. Other: There is a tiny fat containing umbilical hernia. The soft tissues and abdominal wall are otherwise unremarkable. Musculoskeletal: No suspicious osseous lesions. There are mild multilevel degenerative changes in the visualized spine. Review of the MIP images confirms the above findings. IMPRESSION: 1. There is a small volume occlusive thrombus in the distal segmental/proximal subsegmental pulmonary artery the right lung lower lobe. No right heart strain no lung infarction. 2. No lung mass, consolidation, pleural effusion or pneumothorax. 3. There are multiple ill-defined liver masses, compatible with metastases. 4. There is an ill-defined 2.0 x 2.9 cm hypoattenuating lesion in the pancreatic neck/proximal body which is favored neoplastic. Differential diagnosis includes primary pancreatic tumor with liver metastases versus metastatic pancreatic and liver lesions in patient with known history of breast carcinoma, less likely. Correlate with tumor markers and tissue sampling. 5. 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 6. Multiple other nonacute observations, as described above. Critical Value/emergent results were called by  telephone at the time of interpretation on 10/29/2023 at 3:31 pm to provider [facility] DAVIS , who verbally acknowledged these results. Electronically Signed   By: Jules Schick M.D.   On: 10/29/2023 15:39   DG Abd 1 View  Result Date: 10/26/2023 CLINICAL DATA:  Epigastric abdominal pain, constipation. EXAM: ABDOMEN - 1 VIEW COMPARISON:  None Available. FINDINGS: The bowel gas pattern is normal. No significant stool burden is noted. No radio-opaque calculi or other significant radiographic abnormality are seen. IMPRESSION: No abnormal bowel dilatation. Electronically Signed   By: Lupita Raider M.D.   On: 10/26/2023 13:45   DG Chest 2 View  Result Date: 10/26/2023 CLINICAL DATA:  Shortness of breath for 3 weeks. EXAM: CHEST - 2 VIEW COMPARISON:  None Available. FINDINGS: The heart size and mediastinal contours are within normal limits. Both lungs are clear. The visualized skeletal structures are unremarkable. IMPRESSION: No active cardiopulmonary disease. Electronically Signed   By: Zenda Alpers.D.  On: 10/26/2023 13:44    Microbiology: Results for orders placed or performed in visit on 10/27/19  SARS Coronavirus 2 (TAT 6-24 hrs)     Status: None   Collection Time: 10/27/19 12:00 AM  Result Value Ref Range Status   SARS Coronavirus 2 RESULT:  NEGATIVE  Final    Comment: RESULT:  NEGATIVESARS-CoV-2 INTERPRETATION:A NEGATIVE  test result means that SARS-CoV-2 RNA was not present in the specimen above the limit of detection of this test. This does not preclude a possible SARS-CoV-2 infection and should not be used as the  sole basis for patient management decisions. Negative results must be combined with clinical observations, patient history, and epidemiological information. Optimum specimen types and timing for peak viral levels during infections caused by SARS-CoV-2  have not been determined. Collection of multiple specimens or types of specimens may be necessary to detect virus. Improper  specimen collection and handling, sequence variability under primers/probes, or organism present below the limit of detection may  lead to false negative results. Positive and negative predictive values of testing are highly dependent on prevalence. False negative test results are more likely when prevalence of disease is high.The expected result is NEGATIVE.Fact  Sheet for  Healthcare Providers: quierodirigir.com.Fact Sheet for Patients: HairSlick.no.Normal Reference Range - Negative     Labs: CBC: Recent Labs  Lab 10/30/23 0721 10/31/23 0255 11/01/23 0446 11/02/23 0415 11/03/23 0101  WBC 7.9 8.0 7.8 7.6 9.2  HGB 12.2 12.8 12.9 12.2 13.0  HCT 37.7 39.9 40.8 37.8 38.3  MCV 80.0 81.3 81.3 79.4* 78.5*  PLT 318 298 308 331 296   Basic Metabolic Panel: Recent Labs  Lab 10/29/23 1234 10/30/23 0721 11/02/23 0419  NA 130* 134* 132*  K 3.7 3.7 3.5  CL 93* 97* 95*  CO2 23 24 23   GLUCOSE 125* 129* 114*  BUN 6* 6* 8  CREATININE 0.61 0.31* 0.58  CALCIUM 8.8* 8.7* 8.8*   Liver Function Tests: Recent Labs  Lab 10/29/23 1234  AST 31  ALT 20  ALKPHOS 216*  BILITOT 0.8  PROT 7.9  ALBUMIN 3.8   CBG: No results for input(s): "GLUCAP" in the last 168 hours.  Discharge time spent: 34 minutes.   Signed: Kathlen Mody, MD [facility] 11/03/2023

## 2023-11-03 NOTE — Progress Notes (Signed)
PHARMACY - ANTICOAGULATION CONSULT NOTE  Pharmacy Consult for heparin Indication: pulmonary embolus  No Known Allergies  Patient Measurements: Height: 5' 1.5" (156.2 cm) Weight: 89.4 kg (197 lb) IBW/kg (Calculated) : 48.95 Heparin Dosing Weight: 69.6kg  Vital Signs: Temp: 99 F (37.2 C) (11/05 0535) Temp Source: Oral (11/05 0535) BP: 101/66 (11/05 0535) Pulse Rate: 90 (11/05 0535)  Labs: Recent Labs    11/01/23 0446 11/02/23 0415 11/02/23 0419 11/03/23 0101 11/03/23 0820  HGB 12.9 12.2  --  13.0  --   HCT 40.8 37.8  --  38.3  --   PLT 308 331  --  296  --   LABPROT  --  15.1  --   --   --   INR  --  1.2  --   --   --   HEPARINUNFRC 0.33  --  0.26* 0.23* 0.37  CREATININE  --   --  0.58  --   --     Estimated Creatinine Clearance: 76 mL/min (by C-G formula based on SCr of 0.58 mg/dL).   Medical History: Past Medical History:  Diagnosis Date   Allergy    Back pain    Cancer (HCC) 2019   left breast cancer-last radiation Dec.2019   Diabetes mellitus without complication (HCC)    Medical history non-contributory    Personal history of radiation therapy    Prediabetes    Tiredness    Vitamin D deficiency    Assessment: 62YOF with PMH of breast cancer on letrozole presented with chest tightness and shortness of breath. CTA chest positive small volume occlusive thrombus in the distal segmental/proximal subsegmental pulmonary artery in the right lung lower lobe. CT abd/pelvis with pancreatic mass with liver lesions concerning for metastases.  Pharmacy consulted to manage heparin. Heparin was held 11/4 for liver biopsy, restarted 11/4 at 1830.  11/03/23 -Heparin level therapeutic at 0.37 on 1800 units/hr. -Hgb 13, plt 296 -No line issues or signs/symptoms of bleeding reported by RN   Goal of Therapy:  Heparin level 0.3-0.7 units/ml Monitor platelets by anticoagulation protocol: Yes   Plan:  -continue heparin gtt at 1800 units/hr -6 hr heparin level  -Daily  heparin level -Continue to monitor CBC and signs/symptoms of bleeding -Follow up transition to oral anticoagulation once appropriate   Thank you for allowing pharmacy to be a part of this patient's care.  Karlton Lemon, PharmD Candidate  11/03/2023 9:09 AM

## 2023-11-03 NOTE — TOC Benefit Eligibility Note (Signed)
Patient Product/process development scientist completed.    The patient is insured through [facility]. Patient has [employer], may use a copay card, and/or apply for patient assistance if available.    Ran test claim for Eliquis 5 mg and the current 30 day co-pay is $110.24.   This test claim was processed through [facility]- copay amounts may vary at other pharmacies due to pharmacy/plan contracts, or as the patient moves through the different stages of their insurance plan.     Roland Earl, CPHT Pharmacy Technician III Certified Patient Advocate [facility] Pharmacy Patient Advocate Team Direct Number: [phone]  Fax: [phone]

## 2023-11-03 NOTE — Progress Notes (Signed)
Ashley Clark   DOB:01-07-1961   ZO#:[id number]   [id number]  Medical oncology   Subjective: I was asked by my partner Dr. Ave Filter to take over her care due to her newly diagnosed metastatic pancreatic cancer.  I met patient and her sister in her room before HER discharge.  The patient, with a history of breast cancer diagnosed in 2019 and treated with surgery, radiation, and Letrozole, presents with chest pain, stomach pain, and shortness of breath that have been ongoing for over a month.  Her symptoms started in September 2024, but much worse in the past months.  The patient reports feeling out of breath even at rest and describes a discomfort in the chest that is constant. The patient also reports a new onset of back pain that started in the hospital. The patient has experienced a decrease in appetite, attributing this to difficulty breathing, and reports eating significantly less than usual. The patient also reports a decrease in energy level, feeling tired and going straight to bed after work. The patient has a history of smoking for about 20 years but quit five years ago. The patient's mother had stage one breast cancer.  Objective:  Vitals:   11/03/23 1230 11/03/23 1346  BP: 120/72 117/68  Pulse: 84 98  Resp: (!) 9 (!) 22  Temp:  98.4 F (36.9 C)  SpO2: 99% 100%    Body mass index is 36.62 kg/m.  Intake/Output Summary (Last 24 hours) at 11/03/2023 1714 Last data filed at 11/03/2023 1400 Gross per 24 hour  Intake 647.81 ml  Output 1550 ml  Net -902.19 ml     Sclerae unicteric  Oropharynx clear  No peripheral adenopathy  Lungs clear -- no rales or rhonchi  Heart regular rate and rhythm  Abdomen soft, moderate distention in the upper left quadrant.  MSK no focal spinal tenderness, no peripheral edema  Neuro nonfocal   CBG (last 3)  No results for input(s): "GLUCAP" in the last 72 hours.   Labs:    Urine Studies No results for input(s): "UHGB", "CRYS" in the last 72  hours.  Invalid input(s): "UACOL", "UAPR", "USPG", "UPH", "UTP", "UGL", "UKET", "UBIL", "UNIT", "UROB", "ULEU", "UEPI", "UWBC", "URBC", "UBAC", "CAST", "UCOM", "BILUA"  Basic Metabolic Panel: Recent Labs  Lab 10/29/23 1234 10/30/23 0721 11/02/23 0419  NA 130* 134* 132*  K 3.7 3.7 3.5  CL 93* 97* 95*  CO2 23 24 23   GLUCOSE 125* 129* 114*  BUN 6* 6* 8  CREATININE 0.61 0.31* 0.58  CALCIUM 8.8* 8.7* 8.8*   GFR Estimated Creatinine Clearance: 76 mL/min (by C-G formula based on SCr of 0.58 mg/dL). Liver Function Tests: Recent Labs  Lab 10/29/23 1234  AST 31  ALT 20  ALKPHOS 216*  BILITOT 0.8  PROT 7.9  ALBUMIN 3.8   Recent Labs  Lab 10/29/23 1234  LIPASE 59*   No results for input(s): "AMMONIA" in the last 168 hours. Coagulation profile Recent Labs  Lab 11/02/23 0415  INR 1.2    CBC: Recent Labs  Lab 10/30/23 0721 10/31/23 0255 11/01/23 0446 11/02/23 0415 11/03/23 0101  WBC 7.9 8.0 7.8 7.6 9.2  HGB 12.2 12.8 12.9 12.2 13.0  HCT 37.7 39.9 40.8 37.8 38.3  MCV 80.0 81.3 81.3 79.4* 78.5*  PLT 318 298 308 331 296   Cardiac Enzymes: No results for input(s): "CKTOTAL", "CKMB", "CKMBINDEX", "TROPONINI" in the last 168 hours. BNP: Invalid input(s): "POCBNP" CBG: No results for input(s): "GLUCAP" in the last 168 hours. D-Dimer  No results for input(s): "DDIMER" in the last 72 hours. Hgb A1c No results for input(s): "HGBA1C" in the last 72 hours. Lipid Profile No results for input(s): "CHOL", "HDL", "LDLCALC", "TRIG", "CHOLHDL", "LDLDIRECT" in the last 72 hours. Thyroid function studies No results for input(s): "TSH", "T4TOTAL", "T3FREE", "THYROIDAB" in the last 72 hours.  Invalid input(s): "FREET3" Anemia work up No results for input(s): "VITAMINB12", "FOLATE", "FERRITIN", "TIBC", "IRON", "RETICCTPCT" in the last 72 hours. Microbiology No results found for this or any previous visit (from the past 240 hour(s)).    Studies:  IR IMAGING GUIDED PORT  INSERTION  Result Date: 11/03/2023 CLINICAL DATA:  Metastatic pancreas cancer, access for chemotherapy EXAM: RIGHT INTERNAL JUGULAR SINGLE LUMEN POWER PORT CATHETER INSERTION Date:  11/03/2023 11/03/2023 12:27 pm Radiologist:  Judie Petit. Ruel Favors, MD Guidance:  Ultrasound and fluoroscopic MEDICATIONS: 1% lidocaine local with epinephrine ANESTHESIA/SEDATION: Versed 1.5 mg IV; Fentanyl 75 mcg IV; Moderate Sedation Time:  27 minutes The patient was continuously monitored during the procedure by the interventional radiology nurse under my direct supervision. FLUOROSCOPY: 0 minutes, 39 seconds (2.0 mGy) COMPLICATIONS: None immediate. CONTRAST:  None. PROCEDURE: Informed consent was obtained from the patient following explanation of the procedure, risks, benefits and alternatives. The patient understands, agrees and consents for the procedure. All questions were addressed. A time out was performed. Maximal barrier sterile technique utilized including caps, mask, sterile gowns, sterile gloves, large sterile drape, hand hygiene, and 2% chlorhexidine scrub. Under sterile conditions and local anesthesia, right internal jugular micropuncture venous access was performed. Access was performed with ultrasound. Images were obtained for documentation of the patent right internal jugular vein. A guide wire was inserted followed by a transitional dilator. This allowed insertion of a guide wire and catheter into the IVC. Measurements were obtained from the SVC / RA junction back to the right IJ venotomy site. In the right infraclavicular chest, a subcutaneous pocket was created over the second anterior rib. This was done under sterile conditions and local anesthesia. 1% lidocaine with epinephrine was utilized for this. A 2.5 cm incision was made in the skin. Blunt dissection was performed to create a subcutaneous pocket over the right pectoralis major muscle. The pocket was flushed with saline vigorously. There was adequate hemostasis.  The port catheter was assembled and checked for leakage. The port catheter was secured in the pocket with two retention sutures. The tubing was tunneled subcutaneously to the right venotomy site and inserted into the SVC/RA junction through a valved peel-away sheath. Position was confirmed with fluoroscopy. Images were obtained for documentation. The patient tolerated the procedure well. No immediate complications. Incisions were closed in a two layer fashion with 4 - 0 Vicryl suture. Dermabond was applied to the skin. The port catheter was accessed, blood was aspirated followed by saline and heparin flushes. Needle was removed. A dry sterile dressing was applied. IMPRESSION: Ultrasound and fluoroscopically guided right internal jugular single lumen power port catheter insertion. Tip in the SVC/RA junction. Catheter ready for use. Electronically Signed   By: Judie Petit.  Shick M.D.   On: 11/03/2023 12:37   US BIOPSY (LIVER)  Result Date: 11/02/2023 INDICATION: Pancreatic mass and multiple liver lesions. EXAM: ULTRASOUND GUIDED CORE BIOPSY OF LIVER MASS MEDICATIONS: None. ANESTHESIA/SEDATION: Moderate (conscious) sedation was employed during this procedure. A total of Versed 2.0 mg and Fentanyl 100 mcg was administered intravenously. Moderate Sedation Time: 10 minutes. The patient's level of consciousness and vital signs were monitored continuously by radiology nursing throughout the procedure under my  direct supervision. PROCEDURE: The procedure, risks, benefits, and alternatives were explained to the patient. Questions regarding the procedure were encouraged and answered. The patient understands and consents to the procedure. A time-out was performed prior to initiating the procedure. Liver lesions were localized by ultrasound. The abdominal wall was prepped with chlorhexidine in a sterile fashion, and a sterile drape was applied covering the operative field. A sterile gown and sterile gloves were used for the  procedure. Local anesthesia was provided with 1% Lidocaine. Under ultrasound guidance, a 17 gauge trocar needle was advanced into a lesion within the left lobe of the liver. After confirming needle tip position, coaxial 18 gauge core biopsy samples were obtained. Three separate core biopsy samples were obtained and submitted in formalin. Gel-Foam pledgets were advanced through the outer needle prior to its retraction and removal. Additional ultrasound was performed. COMPLICATIONS: None immediate. FINDINGS: Multiple mass lesions are seen throughout the liver. The largest and most accessible was in the anterior subcapsular left lobe measuring approximately 3.1 x 3.6 cm in greatest diameter. Solid core biopsy samples were obtained. IMPRESSION: Ultrasound-guided core biopsy performed of a lesion within the left lobe of the liver measuring approximately 3.6 cm in greatest diameter. Electronically Signed   By: Irish Lack M.D.   On: 11/02/2023 15:19    Assessment: 62 y.o. female   Metastatic Pancreatic Cancer Newly diagnosed with liver metastases. Patient has a history of breast cancer. She presents with dyspnea, chest discomfort, decreased appetite, and weight loss. She also reports new onset back pain. -I personally reviewed her scan images and discussed the biopsy results with her and her sister. -I reviewed the aggressive nature of pancreatic cancer, and the incurable nature of her disease due to diffuse liver metastasis. -I discussed the treatment options, mainly with chemotherapy.  Start FOLFIRINOX chemotherapy regimen next week. -Perform molecular testing on tumor tissue to identify potential targeted therapy options. -Consider genetic testing for BRCA1/2 mutations, which could open up treatment options such as PARP inhibitors. -Check tumor markers to monitor response to treatment.   Venous Thromboembolism Patient was found to have blood clots in her lungs during this hospital stay, likely  secondary to cancer. She is currently on Eliquis. -Continue Eliquis indefinitely.  Breast Cancer Patient was diagnosed in 2019 and underwent surgery, radiation, and is currently on Letrozole. -Continue Letrozole.  Diabetes Patient is on medication for diabetes. -Continue current diabetes management.  Back Pain New onset, localized to one area. -Monitor and manage symptomatically.   Plan:  -Okay to discharge from oncology standpoint -I will schedule her chemo class -Plan to start first-line chemotherapy FOLFIRINOX in 1 to 2 weeks.  I spent a total of 50 minutes for her visit today,  [name], MD 11/03/2023  5:14 PM

## 2023-11-03 NOTE — Plan of Care (Signed)
  Problem: Education: Goal: Knowledge of General Education information will improve Description: Including pain rating scale, medication(s)/side effects and non-pharmacologic comfort measures Outcome: Progressing   Problem: Clinical Measurements: Goal: Ability to maintain clinical measurements within normal limits will improve Outcome: Progressing   

## 2023-11-03 NOTE — Progress Notes (Signed)
Discharge instructions given to patient and all questions were answered.  

## 2023-11-03 NOTE — Procedures (Signed)
Interventional Radiology Procedure Note  Procedure: RT internal jugular POWER PORT    Complications: None  Estimated Blood Loss:  MIN  Findings: TIP SVCRA    M. TREVOR Levaeh Vice, MD    

## 2023-11-03 NOTE — Progress Notes (Signed)
PHARMACY - ANTICOAGULATION CONSULT NOTE  Pharmacy Consult for heparin Indication: pulmonary embolus  No Known Allergies  Patient Measurements: Height: 5' 1.5" (156.2 cm) Weight: 89.4 kg (197 lb) IBW/kg (Calculated) : 48.95 Heparin Dosing Weight: 69.6kg  Vital Signs: Temp: 98.5 F (36.9 C) (11/04 2020) Temp Source: Oral (11/04 2020) BP: 101/69 (11/04 2020) Pulse Rate: 110 (11/04 2020)  Labs: Recent Labs    11/01/23 0446 11/02/23 0415 11/02/23 0419 11/03/23 0101  HGB 12.9 12.2  --  13.0  HCT 40.8 37.8  --  38.3  PLT 308 331  --  296  LABPROT  --  15.1  --   --   INR  --  1.2  --   --   HEPARINUNFRC 0.33  --  0.26* 0.23*  CREATININE  --   --  0.58  --     Estimated Creatinine Clearance: 76 mL/min (by C-G formula based on SCr of 0.58 mg/dL).   Medical History: Past Medical History:  Diagnosis Date   Allergy    Back pain    Cancer (HCC) 2019   left breast cancer-last radiation Dec.2019   Diabetes mellitus without complication (HCC)    Medical history non-contributory    Personal history of radiation therapy    Prediabetes    Tiredness    Vitamin D deficiency    Assessment: 62YOF with PMH of breast cancer on letrozole presented with chest tightness and shortness of breath. CTA chest positive small volume occlusive thrombus in the distal segmental/proximal subsegmental pulmonary artery in the right lung lower lobe. CT abd/pelvis with pancreatic mass with liver lesions concerning for metastases.  Pharmacy consulted to manage heparin.  11/03/23 -Heparin level = 0.23 (subtherapeutic) on 1650 units/hr. -Hgb 13, plt 296 - s/p liver biopsy 11/4 -No line issues or signs/symptoms of bleeding reported by RN   Goal of Therapy:  Heparin level 0.3-0.7 units/ml Monitor platelets by anticoagulation protocol: Yes   Plan:  -Increase heparin gtt to 1800 units/hr -6 hr heparin level after rate increase -Daily heparin level -Continue to monitor CBC and signs/symptoms of  bleeding -Follow up transition to oral anticoagulation once appropriate   Thank you for allowing pharmacy to be a part of this patient's care.  Terrilee Files, PharmD 11/03/2023 1:45 AM

## 2023-11-04 ENCOUNTER — Other Ambulatory Visit: Payer: Self-pay

## 2023-11-04 ENCOUNTER — Telehealth: Payer: Self-pay | Admitting: Hematology

## 2023-11-05 ENCOUNTER — Other Ambulatory Visit: Payer: Self-pay

## 2023-11-06 ENCOUNTER — Other Ambulatory Visit: Payer: Self-pay

## 2023-11-06 ENCOUNTER — Other Ambulatory Visit: Payer: Self-pay | Admitting: Genetic Counselor

## 2023-11-06 ENCOUNTER — Telehealth: Payer: Self-pay | Admitting: Genetic Counselor

## 2023-11-06 DIAGNOSIS — C259 Malignant neoplasm of pancreas, unspecified: Secondary | ICD-10-CM

## 2023-11-06 NOTE — Telephone Encounter (Signed)
POC genetic testing ordered by Dr. Mosetta Putt due to [facility] of pancreatic and breast cancer and FH of breast cancer.  Ordered Ambry kit for flush on 11/18.  Results will be placed in S drive and provided to MD unless patient is scheduled in genetics.

## 2023-11-08 LAB — CANCER ANTIGEN 27.29: CA 27.29: 815.3 U/mL — ABNORMAL HIGH (ref 0.0–38.6)

## 2023-11-09 ENCOUNTER — Encounter: Payer: Self-pay | Admitting: Hematology

## 2023-11-09 ENCOUNTER — Other Ambulatory Visit: Payer: Self-pay

## 2023-11-09 ENCOUNTER — Other Ambulatory Visit (HOSPITAL_COMMUNITY): Payer: Self-pay

## 2023-11-09 ENCOUNTER — Inpatient Hospital Stay: Payer: 59 | Attending: Hematology

## 2023-11-09 DIAGNOSIS — C50812 Malignant neoplasm of overlapping sites of left female breast: Secondary | ICD-10-CM

## 2023-11-09 DIAGNOSIS — C251 Malignant neoplasm of body of pancreas: Secondary | ICD-10-CM | POA: Insufficient documentation

## 2023-11-09 DIAGNOSIS — C787 Secondary malignant neoplasm of liver and intrahepatic bile duct: Secondary | ICD-10-CM | POA: Insufficient documentation

## 2023-11-09 DIAGNOSIS — K8689 Other specified diseases of pancreas: Secondary | ICD-10-CM

## 2023-11-09 DIAGNOSIS — Z5111 Encounter for antineoplastic chemotherapy: Secondary | ICD-10-CM | POA: Insufficient documentation

## 2023-11-10 ENCOUNTER — Telehealth: Payer: Self-pay | Admitting: Genetic Counselor

## 2023-11-10 ENCOUNTER — Inpatient Hospital Stay: Payer: 59 | Admitting: Licensed Clinical Social Worker

## 2023-11-10 ENCOUNTER — Inpatient Hospital Stay: Payer: 59

## 2023-11-10 ENCOUNTER — Telehealth: Payer: Self-pay | Admitting: Licensed Clinical Social Worker

## 2023-11-10 ENCOUNTER — Other Ambulatory Visit: Payer: Self-pay

## 2023-11-10 ENCOUNTER — Inpatient Hospital Stay (HOSPITAL_BASED_OUTPATIENT_CLINIC_OR_DEPARTMENT_OTHER): Payer: 59 | Admitting: Hematology

## 2023-11-10 VITALS — BP 113/74 | HR 110 | Temp 98.3°F | Resp 19 | Wt 185.3 lb

## 2023-11-10 DIAGNOSIS — Z170 Estrogen receptor positive status [ER+]: Secondary | ICD-10-CM

## 2023-11-10 DIAGNOSIS — C50812 Malignant neoplasm of overlapping sites of left female breast: Secondary | ICD-10-CM | POA: Diagnosis not present

## 2023-11-10 DIAGNOSIS — Z5111 Encounter for antineoplastic chemotherapy: Secondary | ICD-10-CM | POA: Diagnosis not present

## 2023-11-10 DIAGNOSIS — C251 Malignant neoplasm of body of pancreas: Secondary | ICD-10-CM

## 2023-11-10 DIAGNOSIS — C787 Secondary malignant neoplasm of liver and intrahepatic bile duct: Secondary | ICD-10-CM | POA: Diagnosis not present

## 2023-11-10 DIAGNOSIS — C259 Malignant neoplasm of pancreas, unspecified: Secondary | ICD-10-CM

## 2023-11-10 LAB — CBC WITH DIFFERENTIAL (CANCER CENTER ONLY)
Abs Immature Granulocytes: 0.11 10*3/uL — ABNORMAL HIGH (ref 0.00–0.07)
Basophils Absolute: 0.1 10*3/uL (ref 0.0–0.1)
Basophils Relative: 0 %
Eosinophils Absolute: 0.3 10*3/uL (ref 0.0–0.5)
Eosinophils Relative: 2 %
HCT: 40.9 % (ref 36.0–46.0)
Hemoglobin: 13.4 g/dL (ref 12.0–15.0)
Immature Granulocytes: 1 %
Lymphocytes Relative: 11 %
Lymphs Abs: 1.3 10*3/uL (ref 0.7–4.0)
MCH: 25.4 pg — ABNORMAL LOW (ref 26.0–34.0)
MCHC: 32.8 g/dL (ref 30.0–36.0)
MCV: 77.6 fL — ABNORMAL LOW (ref 80.0–100.0)
Monocytes Absolute: 1.3 10*3/uL — ABNORMAL HIGH (ref 0.1–1.0)
Monocytes Relative: 11 %
Neutro Abs: 9 10*3/uL — ABNORMAL HIGH (ref 1.7–7.7)
Neutrophils Relative %: 75 %
Platelet Count: 336 10*3/uL (ref 150–400)
RBC: 5.27 MIL/uL — ABNORMAL HIGH (ref 3.87–5.11)
RDW: 14 % (ref 11.5–15.5)
WBC Count: 12 10*3/uL — ABNORMAL HIGH (ref 4.0–10.5)
nRBC: 0 % (ref 0.0–0.2)

## 2023-11-10 LAB — CMP (CANCER CENTER ONLY)
ALT: 21 U/L (ref 0–44)
AST: 37 U/L (ref 15–41)
Albumin: 3.5 g/dL (ref 3.5–5.0)
Alkaline Phosphatase: 265 U/L — ABNORMAL HIGH (ref 38–126)
Anion gap: 10 (ref 5–15)
BUN: 9 mg/dL (ref 8–23)
CO2: 29 mmol/L (ref 22–32)
Calcium: 9.2 mg/dL (ref 8.9–10.3)
Chloride: 92 mmol/L — ABNORMAL LOW (ref 98–111)
Creatinine: 0.55 mg/dL (ref 0.44–1.00)
GFR, Estimated: >60 mL/min (ref >60)
Glucose, Bld: 142 mg/dL — ABNORMAL HIGH (ref 70–99)
Potassium: 3.4 mmol/L — ABNORMAL LOW (ref 3.5–5.1)
Sodium: 131 mmol/L — ABNORMAL LOW (ref 135–145)
Total Bilirubin: 0.9 mg/dL (ref <1.2)
Total Protein: 7.5 g/dL (ref 6.5–8.1)

## 2023-11-10 MED ORDER — HEPARIN SOD (PORK) LOCK FLUSH 100 UNIT/ML IV SOLN
500.0000 [IU] | Freq: Once | INTRAVENOUS | Status: AC
  Administered 2023-11-10: 500 [IU] via INTRAVENOUS

## 2023-11-10 NOTE — Progress Notes (Signed)
[facility]   Telephone:(336) [phone] Fax:([phone]) [phone]   Clinic Follow up Note   Patient Care Team: [name], PA as PCP - General (Nurse Practitioner) [name], MD as Consulting Physician (Hematology and Oncology) [name], MD as Consulting Physician (Radiation Oncology) [name], MD as Consulting Physician (General Surgery) [name], MD as Consulting Physician (Hematology and Oncology)  Date of Service:  11/10/2023  CHIEF COMPLAINT: f/u of metastatic pancreatic cancer  CURRENT THERAPY:  First-line chemotherapy FOLFIRINOX  Oncology History   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 She presents with dyspnea, chest discomfort, decreased appetite, and weight loss. She also reports new onset back pain. -CT showed a 2.9cm mass in pancreatic neck/body, and multiple liver mets, liver biopsy confirmed adenocarcinoma  -I reviewed the aggressive nature of pancreatic cancer, and the incurable nature of her disease due to diffuse liver metastasis. -I discussed the treatment options, mainly with chemotherapy.  Start FOLFIRINOX chemotherapy regimen today 11/12 -Perform molecular testing on tumor tissue to identify potential targeted therapy options. -Consider genetic testing for BRCA1/2 mutations, which could open up treatment options such as PARP inhibitors.  Malignant neoplasm of overlapping sites of left breast in female, estrogen receptor positive (HCC) -Stage IA diagnosed in 09/2018 -09/29/2018 left lumpectomy: Grade 3 IDC, 1.7 cm, margins negative, lymphovascular invasion present, 0/2 lymph nodes negative, T1c N0 stage Ia, ER 90%, PR 95%, HER-2 negative, Ki-67 20% Oncotype DX score 13: Risk of recurrence at 9 years: 4% - Adjuvant radiation therapy 11/12/2018-12/27/2018 -Adjuvant antiestrogen therapy: Letrozole 2.5 mg daily started 12/27/2018   Assessment and Plan     Metastatic Pancreatic Cancer A 62 year old with metastatic pancreatic cancer, experiencing anorexia, early satiety, and fatigue. Starting chemotherapy tomorrow. Educated on treatment regimen, potential side effects (cold sensitivity), and the need to avoid cold foods and drinks. Prescribed Compazine and Zofran for nausea. Reports postprandial pain likely due to pancreatic proximity to the stomach. Emphasized hydration and infection monitoring. Discussed chemotherapy benefits in tumor shrinkage and symptom improvement, though it may take a few cycles. - Start chemotherapy regimen tomorrow with premedication and three chemotherapy drugs plus leucovorin - Educate on avoiding cold foods and drinks, especially in the first week post-chemotherapy - Monitor for fever, chills, and signs of infection; advise to contact the clinic or go to the emergency room if these occur - Call next Monday or Tuesday to check on post-chemotherapy status - Schedule next chemotherapy cycle for November 27 and subsequent cycle for December 11 - Refer to dietician urgently for nutritional support - Recommend trying Ensure or Boost for liquid nutrition - Advise taking Tylenol or tramadol for pain management - Ensure adequate hydration to address high heart rate  Pain Management Reports postprandial pain likely due to pancreatic proximity to the stomach. Prescribed tramadol and has extra strength Tylenol available. Discussed benefits of taking pain medication before eating. - Advise taking Tylenol or tramadol for pain management - Monitor pain levels and adjust medication as needed  Malnutrition Experiencing significant weight loss and fatigue due to inadequate nutritional intake secondary to metastatic pancreatic cancer. Reports early satiety and difficulty consuming sufficient food and liquids. Discussed importance of nutritional support and benefits of liquid nutrition like Ensure or Boost. - Refer to dietician urgently  for nutritional support - Recommend trying Ensure or Boost for liquid nutrition - Advise taking Tylenol or tramadol for pain management to facilitate eating - Ensure adequate hydration to address high  heart rate  Plan -Lab reviewed, adequate for treatment, will proceed for cycle FOLFIRINOX with dose reduction tomorrow -Urgent dietitian referral - Call next Monday or Tuesday to check on post-chemotherapy status - Schedule next chemotherapy cycle for November 27 and subsequent cycle for December 11 - Print out updated schedule.        SUMMARY OF ONCOLOGIC HISTORY: Oncology History  Malignant neoplasm of overlapping sites of left breast in female, estrogen receptor positive (HCC)  08/04/2018 Initial Diagnosis   Screening mammogram detected left breast mass at 9:30 position 1.2 x 1.2 x 1.1 cm.  Closer to the nipple at 9:30 position 0.8 cm lesion span 3.6 cm and a 1.5 cm apart.  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 position 2 cm from nipple, at 1 cm from nipple PASH, lymph node benign, ER 90%, PR 95%, Ki-67 20%, HER-2 negative by IHC 1+, T1CN0 stage Ia AJCC 8   09/29/2018 Surgery   Left lumpectomy: Grade 3 IDC, 1.7 cm, margins negative, lymphovascular invasion present, 0/2 lymph nodes negative, T1c N0 stage Ia   10/06/2018 Cancer Staging   Staging form: Breast, AJCC 8th Edition - Pathologic: Stage IA (pT1c, pN0(sn), cM0, G3, ER+, PR+, HER2-) - Signed by [name], MD on 10/06/2018   10/22/2018 Oncotype testing   Oncotype score 13: Distant recurrence at 9 years with hormone therapy alone 4%   11/12/2018 - 12/27/2018 Radiation Therapy   Adjuvant radiation therapy   12/27/2018 -  Anti-estrogen oral therapy   Antiestrogen therapy with letrozole 2.5 mg daily   Pancreatic cancer (HCC)  11/03/2023 Initial Diagnosis   Pancreatic cancer (HCC)   11/11/2023 -  Chemotherapy   Patient is on Treatment Plan : PANCREAS  NALIRIFOX D1, 15 Q28D        Discussed the use of AI scribe software for clinical note transcription with the patient, who gave verbal consent to proceed.  History of Present Illness   A 62 year old patient with a recent diagnosis of metastatic pancreatic cancer presents for follow-up after hospital admission. The patient reports a significant decrease in appetite, feeling full after consuming small amounts of food, and fatigue. She has been trying to maintain hydration and nutrition, but struggles due to feeling full quickly. She also mentions a new onset of cough. The patient has a port inserted for chemotherapy, which is reported to be in good condition. She is scheduled to start chemotherapy treatment the following day and has attended a chemotherapy education class. The patient also reports pain after eating, which she describes as being located "right here", indicating a spot on her body. She has not yet tried taking Tylenol for this pain.         All other systems were reviewed with the patient and are negative.  MEDICAL HISTORY:  Past Medical History:  Diagnosis Date   Allergy    Back pain    Cancer (HCC) 2019   left breast cancer-last radiation Dec.2019   Diabetes mellitus without complication (HCC)    Medical history non-contributory    Personal history of radiation therapy    Prediabetes    Tiredness    Vitamin D deficiency     SURGICAL HISTORY: Past Surgical History:  Procedure Laterality Date   BREAST BIOPSY Left 08/04/2018   x3   BREAST LUMPECTOMY Left    09-29-18   BREAST LUMPECTOMY WITH RADIOACTIVE SEED AND SENTINEL LYMPH NODE BIOPSY Left  09/29/2018   Procedure: LEFT BREAST LUMPECTOMY WITH RADIOACTIVE SEED AND SENTINEL LYMPH NODE BIOPSY;  Surgeon: [name], MD;  Location: [facility];  Service: General;  Laterality: Left;   COLONOSCOPY  2016   IR IMAGING GUIDED PORT INSERTION  11/03/2023   POLYPECTOMY     WISDOM TOOTH EXTRACTION      I  have reviewed the social history and family history with the patient and they are unchanged from previous note.  ALLERGIES:  has No Known Allergies.  MEDICATIONS:  Current Outpatient Medications  Medication Sig Dispense Refill   APIXABAN (ELIQUIS) VTE STARTER PACK (10MG  AND 5MG ) Take as directed on package: start with two-5mg  tablets twice daily for 7 days. On day 8, switch to one-5mg  tablet twice daily. 74 each 0   cetirizine (ZYRTEC) 10 MG chewable tablet Chew 10 mg by mouth daily.     lidocaine-prilocaine (EMLA) cream Apply to affected area once 30 g 3   losartan (COZAAR) 25 MG tablet Take 1 tablet (25 mg total) by mouth daily for kidney protection. 90 tablet 3   ondansetron (ZOFRAN) 8 MG tablet Take 1 tablet (8 mg total) by mouth every 8 (eight) hours as needed for nausea or vomiting. Start on the third day after irinotecan 30 tablet 1   prochlorperazine (COMPAZINE) 10 MG tablet Take 1 tablet (10 mg total) by mouth every 6 (six) hours as needed for nausea or vomiting. 30 tablet 1   No current facility-administered medications for this visit.    PHYSICAL EXAMINATION: ECOG PERFORMANCE STATUS: 2 - Symptomatic, <50% confined to bed  Vitals:   11/10/23 0855  BP: 113/74  Pulse: (!) 110  Resp: 19  Temp: 98.3 F (36.8 C)  SpO2: 98%   Wt Readings from Last 3 Encounters:  11/10/23 185 lb 4.8 oz (84.1 kg)  10/29/23 197 lb (89.4 kg)  08/19/23 199 lb (90.3 kg)     GENERAL:alert, no distress and comfortable SKIN: skin color, texture, turgor are normal, no rashes or significant lesions EYES: normal, Conjunctiva are pink and non-injected, sclera clear NECK: supple, thyroid normal size, non-tender, without nodularity LYMPH:  no palpable lymphadenopathy in the cervical, axillary  LUNGS: clear to auscultation and percussion with normal breathing effort HEART: regular rate & rhythm and no murmurs and no lower extremity edema ABDOMEN:abdomen soft, non-tender and normal bowel  sounds Musculoskeletal:no cyanosis of digits and no clubbing  NEURO: alert & oriented x 3 with fluent speech, no focal motor/sensory deficits   LABORATORY DATA:  I have reviewed the data as listed    Latest Ref Rng & Units 11/10/2023    8:19 AM 11/03/2023    1:01 AM 11/02/2023    4:15 AM  CBC  WBC 4.0 - 10.5 K/uL 12.0  9.2  7.6   Hemoglobin 12.0 - 15.0 g/dL 57.8  46.9  62.9   Hematocrit 36.0 - 46.0 % 40.9  38.3  37.8   Platelets 150 - 400 K/uL 336  296  331         Latest Ref Rng & Units 11/10/2023    8:19 AM 11/02/2023    4:19 AM 10/30/2023    7:21 AM  CMP  Glucose 70 - 99 mg/dL 528  413  244   BUN 8 - 23 mg/dL 9  8  6    Creatinine 0.44 - 1.00 mg/dL 0.10  2.72  5.36   Sodium 135 - 145 mmol/L 131  132  134   Potassium 3.5 - 5.1 mmol/L  3.4  3.5  3.7   Chloride 98 - 111 mmol/L 92  95  97   CO2 22 - 32 mmol/L 29  23  24    Calcium 8.9 - 10.3 mg/dL 9.2  8.8  8.7   Total Protein 6.5 - 8.1 g/dL 7.5     Total Bilirubin <1.2 mg/dL 0.9     Alkaline Phos 38 - 126 U/L 265     AST 15 - 41 U/L 37     ALT 0 - 44 U/L 21         RADIOGRAPHIC STUDIES: I have personally reviewed the radiological images as listed and agreed with the findings in the report. No results found.    Orders Placed This Encounter  Procedures   CBC with Differential (Cancer Center Only)    Standing Status:   Future    Standing Expiration Date:   01/04/2025   CMP (Cancer Center only)    Standing Status:   Future    Standing Expiration Date:   01/04/2025   CBC with Differential (Cancer Center Only)    Standing Status:   Future    Standing Expiration Date:   01/18/2025   CMP (Cancer Center only)    Standing Status:   Future    Standing Expiration Date:   01/18/2025   CBC with Differential (Cancer Center Only)    Standing Status:   Future    Standing Expiration Date:   02/01/2025   CMP (Cancer Center only)    Standing Status:   Future    Standing Expiration Date:   02/01/2025   CBC with Differential (Cancer  Center Only)    Standing Status:   Future    Standing Expiration Date:   02/15/2025   CMP (Cancer Center only)    Standing Status:   Future    Standing Expiration Date:   02/15/2025   Ambulatory Referral to [facility] Nutrition    Referral Priority:   Urgent    Referral Type:   Consultation    Referral Reason:   Specialty Services Required    Number of Visits Requested:   1   All questions were answered. The patient knows to call the clinic with any problems, questions or concerns. No barriers to learning was detected. The total time spent in the appointment was 40 minutes.     [name], MD 11/10/2023

## 2023-11-10 NOTE — Telephone Encounter (Signed)
I contacted Ashley Clark and offered to cancel her genetic counseling appointment on Thursday. She has numerous appointments in the future and her only appointment on Thursday was with genetics. We discussed the option of canceling her appointment and Dr. Mosetta Putt will disclose her results. I mentioned results will take about 3 weeks. If the results are negative, we will not have a genetic counseling session, but if they are positive, we will get her scheduled for a genetic counseling session. She was very happy to hear this was an option and opted to cancel her Thursday appointment and reschedule if her results are positive.   Lalla Brothers, MS, [facility] Genetic Counselor [email or web address] (P) [phone]

## 2023-11-10 NOTE — Assessment & Plan Note (Signed)
-  stage IV with liver mets -diagnosed in 10/2023 -Patient has a history of breast cancer. She presents with dyspnea, chest discomfort, decreased appetite, and weight loss. She also reports new onset back pain. -CT showed a 2.9cm mass in pancreatic neck/body, and multiple liver mets, liver biopsy confirmed adenocarcinoma  -I reviewed the aggressive nature of pancreatic cancer, and the incurable nature of her disease due to diffuse liver metastasis. -I discussed the treatment options, mainly with chemotherapy.  Start FOLFIRINOX chemotherapy regimen today 11/12 -Perform molecular testing on tumor tissue to identify potential targeted therapy options. -Consider genetic testing for BRCA1/2 mutations, which could open up treatment options such as PARP inhibitors.

## 2023-11-10 NOTE — Progress Notes (Signed)
Verbal order from Dr. Mosetta Putt for referral to Social Work and Nutrition.

## 2023-11-10 NOTE — Assessment & Plan Note (Signed)
-  Stage IA diagnosed in 09/2018 -09/29/2018 left lumpectomy: Grade 3 IDC, 1.7 cm, margins negative, lymphovascular invasion present, 0/2 lymph nodes negative, T1c N0 stage Ia, ER 90%, PR 95%, HER-2 negative, Ki-67 20% Oncotype DX score 13: Risk of recurrence at 9 years: 4% - Adjuvant radiation therapy 11/12/2018-12/27/2018 -Adjuvant antiestrogen therapy: Letrozole 2.5 mg daily started 12/27/2018

## 2023-11-10 NOTE — Progress Notes (Signed)
[facility] Clinical Social Work  Initial Assessment   Ashley Clark is a 62 y.o. year old female contacted by phone. Clinical Social Work was referred by medical provider for assessment of psychosocial needs.   SDOH (Social Determinants of Health) assessments performed: Yes   SDOH Screenings   Food Insecurity: No Food Insecurity (10/29/2023)  Housing: Low Risk  (10/29/2023)  Transportation Needs: No Transportation Needs (10/29/2023)  Utilities: Not At Risk (10/29/2023)  Depression (PHQ2-9): Low Risk  (06/10/2022)  Tobacco Use: Medium Risk (10/29/2023)     Distress Screen completed: Yes    08/18/2018    3:23 PM  ONCBCN DISTRESS SCREENING  Distress experienced in past week (1-10) 4  Practical problem type Work/school  Physical Problem type Sleep/insomnia      Family/Social Information:  Housing Arrangement: patient lives with her husband.  Family members/support persons in your life? Pt reports her husband is retired and will be able to provide the support she needs at home as well as transportation.  Pt also reports having friends and family locally that are able and willing to assist if needed.  Transportation concerns: no  Employment: Working full time pt employed by [employer].  Pt is in the process of reaching out to HR to initiate short term disability benefits.   Income source: Employment Financial concerns: No Type of concern: None Food access concerns: no Religious or spiritual practice: Not known Services Currently in place:  none  Coping/ Adjustment to diagnosis: Patient understands treatment plan and what happens next? yes Concerns about diagnosis and/or treatment: Overwhelmed by information and Quality of life Patient reported stressors: Anxiety/ nervousness and Adjusting to my illness Hopes and/or priorities: Pt's priority is to start treatment w/ the hope of positive results.  Patient enjoys  not addressed Current coping skills/ strengths: Motivation for  treatment/growth , Physical Health , and Supportive family/friends     SUMMARY: Current SDOH Barriers:  No barriers identified at this time.   Clinical Social Work Clinical Goal(s):  No clinical social work goals at this time  Interventions: Discussed common feeling and emotions when being diagnosed with cancer, and the importance of support during treatment Informed patient of the support team roles and support services at [facility] Provided CSW contact information and encouraged patient to call with any questions or concerns Emailed pt the flyers for November for [employer] Supportive services.   Follow Up Plan: Patient will contact CSW with any support or resource needs Patient verbalizes understanding of plan: Yes    Rachel Moulds, LCSW Clinical Social Worker [facility]

## 2023-11-10 NOTE — Telephone Encounter (Signed)
Pt requested to call CSW back at a later time.

## 2023-11-11 ENCOUNTER — Inpatient Hospital Stay (HOSPITAL_BASED_OUTPATIENT_CLINIC_OR_DEPARTMENT_OTHER): Payer: 59 | Admitting: Physician Assistant

## 2023-11-11 ENCOUNTER — Inpatient Hospital Stay: Payer: 59

## 2023-11-11 VITALS — BP 119/67 | HR 91 | Temp 98.3°F | Resp 18 | Wt 184.5 lb

## 2023-11-11 VITALS — BP 108/69 | HR 95 | Resp 20

## 2023-11-11 DIAGNOSIS — Z5111 Encounter for antineoplastic chemotherapy: Secondary | ICD-10-CM | POA: Diagnosis not present

## 2023-11-11 DIAGNOSIS — C787 Secondary malignant neoplasm of liver and intrahepatic bile duct: Secondary | ICD-10-CM | POA: Diagnosis not present

## 2023-11-11 DIAGNOSIS — C251 Malignant neoplasm of body of pancreas: Secondary | ICD-10-CM

## 2023-11-11 DIAGNOSIS — C259 Malignant neoplasm of pancreas, unspecified: Secondary | ICD-10-CM

## 2023-11-11 MED ORDER — SODIUM CHLORIDE 0.9 % IV SOLN: Freq: Once | INTRAVENOUS | Status: DC | PRN

## 2023-11-11 MED ORDER — ATROPINE SULFATE 1 MG/ML IV SOLN
0.5000 mg | Freq: Once | INTRAVENOUS | Status: AC | PRN
  Administered 2023-11-11: 0.5 mg via INTRAVENOUS
  Filled 2023-11-11: qty 1

## 2023-11-11 MED ORDER — FLUOROURACIL CHEMO INJECTION 5 GM/100ML
2000.0000 mg/m2 | INTRAVENOUS | Status: DC
  Administered 2023-11-11: 3950 mg via INTRAVENOUS
  Filled 2023-11-11: qty 79

## 2023-11-11 MED ORDER — DEXTROSE 5 % IV SOLN
INTRAVENOUS | Status: DC
Start: 2023-11-11 — End: 2023-11-11

## 2023-11-11 MED ORDER — DEXAMETHASONE SODIUM PHOSPHATE 10 MG/ML IJ SOLN
10.0000 mg | Freq: Once | INTRAMUSCULAR | Status: AC
  Administered 2023-11-11: 10 mg via INTRAVENOUS
  Filled 2023-11-11: qty 1

## 2023-11-11 MED ORDER — DEXTROSE 5 % IV SOLN
60.0000 mg/m2 | Freq: Once | INTRAVENOUS | Status: AC
  Administered 2023-11-11: 120 mg via INTRAVENOUS
  Filled 2023-11-11: qty 4

## 2023-11-11 MED ORDER — FAMOTIDINE IN NACL 20-0.9 MG/50ML-% IV SOLN
20.0000 mg | Freq: Once | INTRAVENOUS | Status: AC | PRN
  Administered 2023-11-11: 20 mg via INTRAVENOUS

## 2023-11-11 MED ORDER — SODIUM CHLORIDE 0.9% FLUSH
10.0000 mL | INTRAVENOUS | Status: DC | PRN
Start: 2023-11-11 — End: 2023-11-11
  Administered 2023-11-11: 10 mL

## 2023-11-11 MED ORDER — PALONOSETRON HCL INJECTION 0.25 MG/5ML
0.2500 mg | Freq: Once | INTRAVENOUS | Status: AC
  Administered 2023-11-11: 0.25 mg via INTRAVENOUS
  Filled 2023-11-11: qty 5

## 2023-11-11 MED ORDER — METHYLPREDNISOLONE SODIUM SUCC 125 MG IJ SOLR
125.0000 mg | Freq: Once | INTRAMUSCULAR | Status: AC | PRN
  Administered 2023-11-11: 62.5 mg via INTRAVENOUS

## 2023-11-11 MED ORDER — SODIUM CHLORIDE 0.9 % IV SOLN
50.0000 mg/m2 | Freq: Once | INTRAVENOUS | Status: AC
  Administered 2023-11-11: 98.9 mg via INTRAVENOUS
  Filled 2023-11-11: qty 23

## 2023-11-11 MED ORDER — LEUCOVORIN CALCIUM INJECTION 350 MG
400.0000 mg/m2 | Freq: Once | INTRAVENOUS | Status: AC
  Administered 2023-11-11: 788 mg via INTRAVENOUS
  Filled 2023-11-11: qty 25

## 2023-11-11 NOTE — Patient Instructions (Signed)
[facility] - A DEPT OF [facility]  Discharge Instructions: Thank you for choosing [facility] to provide your oncology and hematology care.   If you have a lab appointment with the Cancer Center, please go directly to the Cancer Center and check in at the registration area.   Wear comfortable clothing and clothing appropriate for easy access to any Portacath or PICC line.   We strive to give you quality time with your provider. You may need to reschedule your appointment if you arrive late (15 or more minutes).  Arriving late affects you and other patients whose appointments are after yours.  Also, if you miss three or more appointments without notifying the office, you may be dismissed from the clinic at the provider's discretion.      For prescription refill requests, have your pharmacy contact our office and allow 72 hours for refills to be completed.    Today you received the following chemotherapy and/or immunotherapy agents: Irinotecan Liposome/Oxaliplatin/Leucovorin/Fluorouracil      To help prevent nausea and vomiting after your treatment, we encourage you to take your nausea medication as directed.  BELOW ARE SYMPTOMS THAT SHOULD BE REPORTED IMMEDIATELY: *FEVER GREATER THAN 100.4 F (38 C) OR HIGHER *CHILLS OR SWEATING *NAUSEA AND VOMITING THAT IS NOT CONTROLLED WITH YOUR NAUSEA MEDICATION *UNUSUAL SHORTNESS OF BREATH *UNUSUAL BRUISING OR BLEEDING *URINARY PROBLEMS (pain or burning when urinating, or frequent urination) *BOWEL PROBLEMS (unusual diarrhea, constipation, pain near the anus) TENDERNESS IN MOUTH AND THROAT WITH OR WITHOUT PRESENCE OF ULCERS (sore throat, sores in mouth, or a toothache) UNUSUAL RASH, SWELLING OR PAIN  UNUSUAL VAGINAL DISCHARGE OR ITCHING   Items with * indicate a potential emergency and should be followed up as soon as possible or go to the Emergency Department if any problems should occur.  Please  show the CHEMOTHERAPY ALERT CARD or IMMUNOTHERAPY ALERT CARD at check-in to the Emergency Department and triage nurse.  Should you have questions after your visit or need to cancel or reschedule your appointment, please contact [facility] - A DEPT OF [facility]  Dept: [phone]  and follow the prompts.  Office hours are 8:00 a.m. to 4:30 p.m. Monday - Friday. Please note that voicemails left after 4:00 p.m. may not be returned until the following business day.  We are closed weekends and major holidays. You have access to a nurse at all times for urgent questions. Please call the main number to the clinic Dept: [phone] and follow the prompts.   For any non-urgent questions, you may also contact your provider using MyChart. We now offer e-Visits for anyone 25 and older to request care online for non-urgent symptoms. For details visit [email or web address].   Also download the MyChart app! Go to the app store, search "MyChart", open the app, select [place], and log in with your MyChart username and password.  The chemotherapy medication bag should finish at 46 hours, 96 hours, or 7 days. For example, if your pump is scheduled for 46 hours and it was put on at 4:00 p.m., it should finish at 2:00 p.m. the day it is scheduled to come off regardless of your appointment time.     Estimated time to finish at approximately 1:30 PM on 11/13/2023.   If the display on your pump reads "Low Volume" and it is beeping, take the batteries out of the pump and come to the cancer center for it  to be taken off.   If the pump alarms go off prior to the pump reading "Low Volume" then call [phone] and someone can assist you.  If the plunger comes out and the chemotherapy medication is leaking out, please use your home chemo spill kit to clean up the spill. Do NOT use paper towels or other household products.  If you have problems or questions regarding your pump,  please call either [phone] (24 hours a day) or the cancer center Monday-Friday 8:00 a.m.- 4:30 p.m. at the clinic number and we will assist you. If you are unable to get assistance, then go to the nearest Emergency Department and ask the staff to contact the IV team for assistance.

## 2023-11-11 NOTE — Progress Notes (Signed)
    DATE:  11/11/23                                        X CHEMO/IMMUNOTHERAPY REACTION          MD: Mosetta Putt   AGENT/BLOOD PRODUCT RECEIVING TODAY:     FOLFIRINOX    AGENT/BLOOD PRODUCT RECEIVING IMMEDIATELY PRIOR TO REACTION:          irinotecan liposome    Vitals:   11/11/23 1010 11/11/23 1012 11/11/23 1017  BP: 104/67 (!) 76/60 108/69  Pulse: 95 98 95  Resp:  11 20  SpO2: 98% 94% 95%      REACTION(S):           facial flushing and back pain   PREMEDS:    Decadron 10 mg IV, Aloxi 0.25 mg IV   INTERVENTION: Pepcid 20 mg IV, solu-medrol 62 mg IV   Review of Systems  Review of Systems  Gastrointestinal:  Positive for nausea.  Musculoskeletal:  Positive for back pain.  Skin:  Positive for color change.  All other systems reviewed and are negative.    Physical Exam  Physical Exam Vitals and nursing note reviewed.  Constitutional:      General: She is in acute distress.     Appearance: She is not ill-appearing or toxic-appearing.     Comments: Facial flushing  HENT:     Head: Normocephalic.  Eyes:     Conjunctiva/sclera: Conjunctivae normal.  Cardiovascular:     Rate and Rhythm: Normal rate and regular rhythm.     Pulses: Normal pulses.     Heart sounds: Normal heart sounds.  Pulmonary:     Effort: Pulmonary effort is normal. No respiratory distress.     Breath sounds: Normal breath sounds. No stridor. No wheezing, rhonchi or rales.  Chest:     Chest wall: No tenderness.  Abdominal:     General: There is no distension.     Tenderness: There is no abdominal tenderness.  Musculoskeletal:     Cervical back: Normal range of motion.  Skin:    General: Skin is warm and dry.  Neurological:     Mental Status: She is alert.     OUTCOME:   Patient became symptomatic after receiving approximately 35 ml of irinotecan. Emergency medications were administered as documented above. Patient returned to baseline. Oncologist notified and agrees to resume treatment.  Irinotecan restarted at rate of 250 ml. Patient tolerated remainder of treatment.  I have spent a total of 10 minutes minutes of face-to-face and non-face-to-face time preparing to see the patient, performing a medically appropriate examination, counseling and educating the patient, ordering medications, documenting clinical information in the electronic health record, and care coordination.                     Marland Kitchen

## 2023-11-11 NOTE — Progress Notes (Signed)
Hypersensitivity Reaction Note  Date of event: 11/11/23  Time of event: 1005   Type of event: Grade 2 (Moderate reaction; Requires therapy or infusion interruption, but responds promptly to interventions; prophylactic medications indicated for <24 hours)    Generic name of drug involved: Other (Irinotecan Liposome (ONIVYDE))  Initial Presentation and Response:  The patient reported and showed signs of flushing, Back  pain, shortness of breath , and hypotension and abdominal discomfort during the infusion.   Provider notified of the hypersensitivity reaction: Daphane Shepherd, PA-C and Dr. Mosetta Putt  Time of provider notification: 1006  Initial Interventions Implemented:   Infusion stopped: Yes  Medications administered - see MAR for sequence and times of administration: Normal Saline 1 L IV, Famotidine (Pepcid) 20 mg IV, and Methylprednisolone (Solu-Medrol) 62.5 mg IV  Additional interventions:  2 L oxygen administered and Pt placed supine  Patient response to treatment: Symptoms resolved following interventions.  Remaining Course of Treatment:    The patient was able to complete the infusion at half the original rate. (Infusion restarted at 250 mL/hr)  Was agent that likely caused hypersensitivity reaction added to Allergies List within EMR? Yes   Additional Information Regarding the Chain of Events (including reaction signs/symptoms, treatment administered, and outcome):  Pt started Irinotecan Liposome infusion at 1000, at 1005 Pt had stated "I don't feel right". Upon visual assessment Pt was found to be flushed and SOB. Pt c/o worsened back pain and abdominal discomfort above baseline. 1006 this RN stopped infusion. Karie Fetch PA-C notified of rxn. Pt described back pain as throbbing, burning, and sharp that was felt over entire back. At 1008 this RN administered 1 L of NS to gravity and placed Pt on 2 L O2 via N/C; at this time Benedict Needy chairside assessing Pt. 1009 Pepcid 20 mg  administered and completed at 1011. Solu-medrol administered at 1012. At 1012 O2 stopped, upon visual assessment Pt no longer flushed, Pt back to baseline complexion. At 1020 Pt stated, "I feel better, not 100%, but better, by back pain is not as bad as it was, the burning is gone".  Per Dr. Mosetta Putt Pt to receive IVFs and when Pt feels back to baseline, restart infusion at 250 mL/hr.  At 1056 this RN stopped NS infusion and restarted Irinotecan Lipsome infusion at 250 mL/hr.  Pt completed infusion at 250 mL/hr.  Pt remained A&O x 4 throughout rxn. See VS flowsheet and MAR respectively.

## 2023-11-12 ENCOUNTER — Encounter: Payer: 59 | Admitting: Genetic Counselor

## 2023-11-12 ENCOUNTER — Telehealth: Payer: Self-pay

## 2023-11-12 NOTE — Telephone Encounter (Signed)
Ashley Clark states that she is doing fine. She is eating, drinking, and urinating well. She knows to call the office at [phone] if  she has any questions or concerns.

## 2023-11-12 NOTE — Telephone Encounter (Signed)
-----   Message from Nurse Reece Levy sent at 11/11/2023  4:00 PM EST ----- Regarding: Dr. Mosetta Putt first time Nalirifox Dr. Mosetta Putt 1st time Irinotecan Lipsome/Oxaliplatin/Leucovorin/Fluorouracil. Pt had rxn to Irinotecan Liposome, able to complete infusion. Pt tolerated the rest of tx well without further incident.

## 2023-11-13 ENCOUNTER — Inpatient Hospital Stay: Payer: 59 | Admitting: Dietician

## 2023-11-13 ENCOUNTER — Inpatient Hospital Stay: Payer: 59

## 2023-11-13 DIAGNOSIS — Z95828 Presence of other vascular implants and grafts: Secondary | ICD-10-CM

## 2023-11-13 DIAGNOSIS — C251 Malignant neoplasm of body of pancreas: Secondary | ICD-10-CM

## 2023-11-13 DIAGNOSIS — Z5111 Encounter for antineoplastic chemotherapy: Secondary | ICD-10-CM | POA: Diagnosis not present

## 2023-11-13 DIAGNOSIS — C787 Secondary malignant neoplasm of liver and intrahepatic bile duct: Secondary | ICD-10-CM | POA: Diagnosis not present

## 2023-11-13 HISTORY — DX: Presence of other vascular implants and grafts: Z95.828

## 2023-11-13 MED ORDER — HEPARIN SOD (PORK) LOCK FLUSH 100 UNIT/ML IV SOLN
500.0000 [IU] | Freq: Once | INTRAVENOUS | Status: AC
  Administered 2023-11-13: 500 [IU]

## 2023-11-13 MED ORDER — SODIUM CHLORIDE 0.9% FLUSH
10.0000 mL | Freq: Once | INTRAVENOUS | Status: AC
  Administered 2023-11-13: 10 mL

## 2023-11-13 MED ORDER — SODIUM CHLORIDE 0.9% FLUSH: 10.0000 mL | INTRAVENOUS | Status: DC | PRN

## 2023-11-13 MED ORDER — HEPARIN SOD (PORK) LOCK FLUSH 100 UNIT/ML IV SOLN: 500.0000 [IU] | Freq: Once | INTRAVENOUS | Status: DC | PRN

## 2023-11-15 NOTE — Assessment & Plan Note (Signed)
-  stage IV with liver mets -diagnosed in 10/2023 -Patient has a history of breast cancer. She presents with dyspnea, chest discomfort, decreased appetite, and weight loss. She also reports new onset back pain. -CT showed a 2.9cm mass in pancreatic neck/body, and multiple liver mets, liver biopsy confirmed adenocarcinoma  -I reviewed the aggressive nature of pancreatic cancer, and the incurable nature of her disease due to diffuse liver metastasis. -she start first line chemo FOLFIRINOX chemotherapy regimen on 11/12, with dose reduction for first cycle due to poor PS -Perform molecular testing on tumor tissue to identify potential targeted therapy options. -Consider genetic testing for BRCA1/2 mutations, which could open up treatment options such as PARP inhibitors.

## 2023-11-16 ENCOUNTER — Ambulatory Visit: Payer: 59

## 2023-11-16 ENCOUNTER — Ambulatory Visit: Payer: 59 | Admitting: Hematology

## 2023-11-16 ENCOUNTER — Inpatient Hospital Stay (HOSPITAL_BASED_OUTPATIENT_CLINIC_OR_DEPARTMENT_OTHER): Payer: 59 | Admitting: Hematology

## 2023-11-16 ENCOUNTER — Encounter: Payer: Self-pay | Admitting: Hematology

## 2023-11-16 ENCOUNTER — Other Ambulatory Visit: Payer: 59

## 2023-11-16 DIAGNOSIS — C251 Malignant neoplasm of body of pancreas: Secondary | ICD-10-CM | POA: Diagnosis not present

## 2023-11-16 DIAGNOSIS — C259 Malignant neoplasm of pancreas, unspecified: Secondary | ICD-10-CM | POA: Diagnosis not present

## 2023-11-16 DIAGNOSIS — Z853 Personal history of malignant neoplasm of breast: Secondary | ICD-10-CM | POA: Diagnosis not present

## 2023-11-16 DIAGNOSIS — Z803 Family history of malignant neoplasm of breast: Secondary | ICD-10-CM | POA: Diagnosis not present

## 2023-11-16 NOTE — Progress Notes (Signed)
[facility]   Telephone:([phone] [phone]   Clinic Follow up Note   Patient Care Team: [name], PA as PCP - General (Nurse Practitioner) [name], MD as Consulting Physician (Hematology and Oncology) [name], MD as Consulting Physician (Radiation Oncology) [name], MD as Consulting Physician (General Surgery) [name], MD as Consulting Physician (Hematology and Oncology) 11/16/2023  I connected with Ashley Clark on 11/16/23 at 11:40 AM EST by telephone and verified that I am speaking with the correct person using two identifiers.   I discussed the limitations, risks, security and privacy concerns of performing an evaluation and management service by telephone and the availability of in person appointments. I also discussed with the patient that there may be a patient responsible charge related to this service. The patient expressed understanding and agreed to proceed.   Patient's location: Home Provider's location:  Office    CHIEF COMPLAINT: Follow-up after first cycle chemotherapy   CURRENT THERAPY: First-line chemotherapy FOLFIRINOX  Oncology history 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 She presents with dyspnea, chest discomfort, decreased appetite, and weight loss. She also reports new onset back pain. -CT showed a 2.9cm mass in pancreatic neck/body, and multiple liver mets, liver biopsy confirmed adenocarcinoma  -I reviewed the aggressive nature of pancreatic cancer, and the incurable nature of her disease due to diffuse liver metastasis. -she start first line chemo FOLFIRINOX chemotherapy regimen on 11/12, with dose reduction for first cycle due to poor PS -Perform molecular testing on tumor tissue to identify potential targeted therapy options. -Consider genetic testing for BRCA1/2 mutations, which could open up treatment options such as  PARP inhibitors.  Assessment and Plan    Pancreatic Cancer Follow-up after first chemotherapy cycle. Experienced an infusion reaction but has since recovered. Abdominal pain has eased, no tramadol needed for the past two days. Appetite improved with small, frequent meals. No nausea. Bowel movements resumed without intervention. Tumor marker CA 19-9 is very high at 24,000. Discussed that effective chemotherapy should lower CA 19-9 levels, which may take a couple of cycles. Clinical improvement in cancer-related symptoms would also indicate a positive response. - Continue chemotherapy as scheduled with next cycle on November 27 - Monitor CA 19-9 levels - Repeat CT scan after cycle five - Encourage small, frequent meals and adequate hydration (at least 50 ounces of liquid in 24 hours) - Monitor bowel movements and weight - Avoid raw meat and seafood; ensure all food is well-cooked and vegetables are thoroughly washed  Indigestion Reports indigestion approximately six hours after eating. Eating small, frequent meals and able to keep food down. Discussed using famotidine. - Take famotidine as needed for indigestion  Constipation Had a bowel movement yesterday without intervention. Has not used tramadol for the past two days, which can cause constipation. - Monitor bowel movements - Avoid tramadol if possible  General Health Maintenance Advised to maintain a balanced diet, stay hydrated, and monitor weight and bowel movements. - Weigh yourself once or twice a week - Ensure adequate hydration (at least 50 ounces of liquid in 24 hours) - Eat fresh, well-washed vegetables and fruits - Avoid raw meat and seafood  Follow-up - Follow-up appointment next week. Before cycle 2 chemo         SUMMARY OF ONCOLOGIC HISTORY: Oncology History  Malignant neoplasm of overlapping sites of left breast in female, estrogen receptor positive (HCC)  08/04/2018 Initial Diagnosis  Screening mammogram  detected left breast mass at 9:30 position 1.2 x 1.2 x 1.1 cm.  Closer to the nipple at 9:30 position 0.8 cm lesion span 3.6 cm and a 1.5 cm apart.  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 position 2 cm from nipple, at 1 cm from nipple PASH, lymph node benign, ER 90%, PR 95%, Ki-67 20%, HER-2 negative by IHC 1+, T1CN0 stage Ia AJCC 8   09/29/2018 Surgery   Left lumpectomy: Grade 3 IDC, 1.7 cm, margins negative, lymphovascular invasion present, 0/2 lymph nodes negative, T1c N0 stage Ia   10/06/2018 Cancer Staging   Staging form: Breast, AJCC 8th Edition - Pathologic: Stage IA (pT1c, pN0(sn), cM0, G3, ER+, PR+, HER2-) - Signed by [name], MD on 10/06/2018   10/22/2018 Oncotype testing   Oncotype score 13: Distant recurrence at 9 years with hormone therapy alone 4%   11/12/2018 - 12/27/2018 Radiation Therapy   Adjuvant radiation therapy   12/27/2018 -  Anti-estrogen oral therapy   Antiestrogen therapy with letrozole 2.5 mg daily   Pancreatic cancer (HCC)  11/03/2023 Initial Diagnosis   Pancreatic cancer (HCC)   11/11/2023 -  Chemotherapy   Patient is on Treatment Plan : PANCREAS NALIRIFOX D1, 15 Q28D       Discussed the use of AI scribe software for clinical note transcription with the patient, who gave verbal consent to proceed.  History of Present Illness   Ashley Clark, a 62 year old female with pancreatic cancer, recently completed her first cycle of chemotherapy. She experienced some complications during the infusion process, which she attributes to her lack of prior exposure to major drugs. Despite this, she reports feeling good post-treatment, with no additional problems after returning home. She experienced left-sided abdominal pain during the infusion, but this has since eased up. She was taking tramadol for the pain but has not needed it for the past two days.  Ashley Clark reports having indigestion about six  hours after eating, which she has been managing by eating small meals every two hours. She has been able to keep down the food and had her first bowel movement since starting chemotherapy. She has not experienced any nausea.  In addition to her cancer treatment, Ashley Clark is also taking Eliquis, a blood thinner, twice daily. She has not been using tramadol for the past two days as she feels good. She also mentions dealing with HR for short term leave due to her treatment.         REVIEW OF SYSTEMS:   Constitutional: Denies fevers, chills or abnormal weight loss Eyes: Denies blurriness of vision Ears, nose, mouth, throat, and face: Denies mucositis or sore throat Respiratory: Denies cough, dyspnea or wheezes Cardiovascular: Denies palpitation, chest discomfort or lower extremity swelling Gastrointestinal:  Denies nausea, heartburn or change in bowel habits Skin: Denies abnormal skin rashes Lymphatics: Denies new lymphadenopathy or easy bruising Neurological:Denies numbness, tingling or new weaknesses Behavioral/Psych: Mood is stable, no new changes  All other systems were reviewed with the patient and are negative.  MEDICAL HISTORY:  Past Medical History:  Diagnosis Date   Allergy    Back pain    Cancer (HCC) 2019   left breast cancer-last radiation Dec.2019   Diabetes mellitus without complication [facility])    Medical history non-contributory    Personal history of radiation therapy    Prediabetes    Tiredness    Vitamin D deficiency  SURGICAL HISTORY: Past Surgical History:  Procedure Laterality Date   BREAST BIOPSY Left 08/04/2018   x3   BREAST LUMPECTOMY Left    09-29-18   BREAST LUMPECTOMY WITH RADIOACTIVE SEED AND SENTINEL LYMPH NODE BIOPSY Left 09/29/2018   Procedure: LEFT BREAST LUMPECTOMY WITH RADIOACTIVE SEED AND SENTINEL LYMPH NODE BIOPSY;  Surgeon: [name], MD;  Location: [facility];  Service: General;  Laterality: Left;   COLONOSCOPY  2016    IR IMAGING GUIDED PORT INSERTION  11/03/2023   POLYPECTOMY     WISDOM TOOTH EXTRACTION      I have reviewed the social history and family history with the patient and they are unchanged from previous note.  ALLERGIES:  is allergic to irinotecan liposome.  MEDICATIONS:  Current Outpatient Medications  Medication Sig Dispense Refill   APIXABAN (ELIQUIS) VTE STARTER PACK (10MG  AND 5MG ) Take as directed on package: start with two-5mg  tablets twice daily for 7 days. On day 8, switch to one-5mg  tablet twice daily. 74 each 0   cetirizine (ZYRTEC) 10 MG chewable tablet Chew 10 mg by mouth daily.     lidocaine-prilocaine (EMLA) cream Apply to affected area once 30 g 3   losartan (COZAAR) 25 MG tablet Take 1 tablet (25 mg total) by mouth daily for kidney protection. 90 tablet 3   ondansetron (ZOFRAN) 8 MG tablet Take 1 tablet (8 mg total) by mouth every 8 (eight) hours as needed for nausea or vomiting. Start on the third day after irinotecan 30 tablet 1   prochlorperazine (COMPAZINE) 10 MG tablet Take 1 tablet (10 mg total) by mouth every 6 (six) hours as needed for nausea or vomiting. 30 tablet 1   No current facility-administered medications for this visit.    PHYSICAL EXAMINATION: Not performed   LABORATORY DATA:  I have reviewed the data as listed    Latest Ref Rng & Units 11/10/2023    8:19 AM 11/03/2023    1:01 AM 11/02/2023    4:15 AM  CBC  WBC 4.0 - 10.5 K/uL 12.0  9.2  7.6   Hemoglobin 12.0 - 15.0 g/dL 16.1  09.6  04.5   Hematocrit 36.0 - 46.0 % 40.9  38.3  37.8   Platelets 150 - 400 K/uL 336  296  331         Latest Ref Rng & Units 11/10/2023    8:19 AM 11/02/2023    4:19 AM 10/30/2023    7:21 AM  CMP  Glucose 70 - 99 mg/dL 409  811  914   BUN 8 - 23 mg/dL 9  8  6    Creatinine 0.44 - 1.00 mg/dL 7.82  9.56  2.13   Sodium 135 - 145 mmol/L 131  132  134   Potassium 3.5 - 5.1 mmol/L 3.4  3.5  3.7   Chloride 98 - 111 mmol/L 92  95  97   CO2 22 - 32 mmol/L 29  23  24     Calcium 8.9 - 10.3 mg/dL 9.2  8.8  8.7   Total Protein 6.5 - 8.1 g/dL 7.5     Total Bilirubin <1.2 mg/dL 0.9     Alkaline Phos 38 - 126 U/L 265     AST 15 - 41 U/L 37     ALT 0 - 44 U/L 21         RADIOGRAPHIC STUDIES: I have personally reviewed the radiological images as listed and agreed with the findings in the report. No results  found.     I discussed the assessment and treatment plan with the patient. The patient was provided an opportunity to ask questions and all were answered. The patient agreed with the plan and demonstrated an understanding of the instructions.   The patient was advised to call back or seek an in-person evaluation if the symptoms worsen or if the condition fails to improve as anticipated.  I provided 12 minutes of non face-to-face telephone visit time during this encounter, and > 50% was spent counseling as documented under my assessment & plan.     [name], MD 11/16/23

## 2023-11-18 ENCOUNTER — Telehealth: Payer: Self-pay

## 2023-11-18 ENCOUNTER — Other Ambulatory Visit (HOSPITAL_COMMUNITY): Payer: Self-pay

## 2023-11-18 ENCOUNTER — Inpatient Hospital Stay: Payer: 59 | Admitting: Licensed Clinical Social Worker

## 2023-11-18 DIAGNOSIS — C251 Malignant neoplasm of body of pancreas: Secondary | ICD-10-CM

## 2023-11-18 NOTE — Telephone Encounter (Signed)
Notified patient that her [employer] FMLA documents had been completed and faxed back to the company. Fax confirmation received. Copy of documents placed upfront for pick up at next visit. No further concerns at this time.

## 2023-11-19 ENCOUNTER — Encounter: Payer: Self-pay | Admitting: Hematology

## 2023-11-19 NOTE — Progress Notes (Signed)
[facility] CSW Progress Note  Clinical Child psychotherapist contacted patient by phone regarding financial concerns.  Pt has contacted HR to initiate the process of receiving short term disability; however, there will be a waiting period which will have a financial impact.  CSW provided pt w/ the application to apply for the [facility] as well as the application for the [employer].  Pt provided with instructions as to how to complete both and will remain available to assist in that process as appropriate.       Rachel Moulds, LCSW Clinical Social Worker [facility]

## 2023-11-23 ENCOUNTER — Other Ambulatory Visit (HOSPITAL_COMMUNITY): Payer: Self-pay

## 2023-11-23 NOTE — Progress Notes (Unsigned)
Palliative Medicine [facility]  [phone] [phone]   Name: [name] Date: 11/23/2023 MRN: [id number]  DOB: 1961-03-20  Patient Care Team: [name], PA as PCP - General (Nurse Practitioner) [name], MD as Consulting Physician (Hematology and Oncology) [name], MD as Consulting Physician (Radiation Oncology) [name], MD as Consulting Physician (General Surgery) [name], MD as Consulting Physician (Hematology and Oncology)    REASON FOR CONSULTATION: MERCEDESE Clark is a 62 y.o. female with oncologic medical history including recently diagnosed pancreatic cancer (10/2023) with metastatic disease to the liver as well as breast cancer of the left breast (07/2018). Palliative ask to see for symptom management and goals of care.    SOCIAL HISTORY:     reports that she quit smoking about 8 years ago. Her smoking use included cigarettes. She has never used smokeless tobacco. She reports current alcohol use of about 4.0 standard drinks of alcohol per week. She reports that she does not use drugs.  ADVANCE DIRECTIVES:  None on file   CODE STATUS: Full code  PAST MEDICAL HISTORY: Past Medical History:  Diagnosis Date   Allergy    Back pain    Cancer (HCC) 2019   left breast cancer-last radiation Dec.2019   Diabetes mellitus without complication (HCC)    Medical history non-contributory    Personal history of radiation therapy    Prediabetes    Tiredness    Vitamin D deficiency     PAST SURGICAL HISTORY:  Past Surgical History:  Procedure Laterality Date   BREAST BIOPSY Left 08/04/2018   x3   BREAST LUMPECTOMY Left    09-29-18   BREAST LUMPECTOMY WITH RADIOACTIVE SEED AND SENTINEL LYMPH NODE BIOPSY Left 09/29/2018   Procedure: LEFT BREAST LUMPECTOMY WITH RADIOACTIVE SEED AND SENTINEL LYMPH NODE BIOPSY;  Surgeon: [name], MD;  Location: [facility];  Service: General;   Laterality: Left;   COLONOSCOPY  2016   IR IMAGING GUIDED PORT INSERTION  11/03/2023   POLYPECTOMY     WISDOM TOOTH EXTRACTION      HEMATOLOGY/ONCOLOGY HISTORY:  Oncology History  Malignant neoplasm of overlapping sites of left breast in female, estrogen receptor positive (HCC)  08/04/2018 Initial Diagnosis   Screening mammogram detected left breast mass at 9:30 position 1.2 x 1.2 x 1.1 cm.  Closer to the nipple at 9:30 position 0.8 cm lesion span 3.6 cm and a 1.5 cm apart.  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 position 2 cm from nipple, at 1 cm from nipple PASH, lymph node benign, ER 90%, PR 95%, Ki-67 20%, HER-2 negative by IHC 1+, T1CN0 stage Ia AJCC 8   09/29/2018 Surgery   Left lumpectomy: Grade 3 IDC, 1.7 cm, margins negative, lymphovascular invasion present, 0/2 lymph nodes negative, T1c N0 stage Ia   10/06/2018 Cancer Staging   Staging form: Breast, AJCC 8th Edition - Pathologic: Stage IA (pT1c, pN0(sn), cM0, G3, ER+, PR+, HER2-) - Signed by [name], MD on 10/06/2018   10/22/2018 Oncotype testing   Oncotype score 13: Distant recurrence at 9 years with hormone therapy alone 4%   11/12/2018 - 12/27/2018 Radiation Therapy   Adjuvant radiation therapy   12/27/2018 -  Anti-estrogen oral therapy   Antiestrogen therapy with letrozole 2.5 mg daily   Pancreatic cancer (HCC)  11/03/2023 Initial Diagnosis   Pancreatic cancer (HCC)  11/11/2023 -  Chemotherapy   Patient is on Treatment Plan : PANCREAS NALIRIFOX D1, 15 Q28D       ALLERGIES:  is allergic to irinotecan liposome.  MEDICATIONS:  Current Outpatient Medications  Medication Sig Dispense Refill   APIXABAN (ELIQUIS) VTE STARTER PACK (10MG  AND 5MG ) Take as directed on package: start with two-5mg  tablets twice daily for 7 days. On day 8, switch to one-5mg  tablet twice daily. 74 each 0   cetirizine (ZYRTEC) 10 MG chewable tablet Chew 10 mg by  mouth daily.     lidocaine-prilocaine (EMLA) cream Apply to affected area once 30 g 3   losartan (COZAAR) 25 MG tablet Take 1 tablet (25 mg total) by mouth daily for kidney protection. 90 tablet 3   ondansetron (ZOFRAN) 8 MG tablet Take 1 tablet (8 mg total) by mouth every 8 (eight) hours as needed for nausea or vomiting. Start on the third day after irinotecan 30 tablet 1   prochlorperazine (COMPAZINE) 10 MG tablet Take 1 tablet (10 mg total) by mouth every 6 (six) hours as needed for nausea or vomiting. 30 tablet 1   No current facility-administered medications for this visit.    VITAL SIGNS: LMP 03/12/2015  There were no vitals filed for this visit.  Estimated body mass index is 34.3 kg/m as calculated from the following:   Height as of 10/29/23: 5' 1.5" (1.562 m).   Weight as of 11/11/23: 184 lb 8 oz (83.7 kg).  LABS: CBC:    Component Value Date/Time   WBC 12.0 (H) 11/10/2023 0819   WBC 9.2 11/03/2023 0101   HGB 13.4 11/10/2023 0819   HCT 40.9 11/10/2023 0819   PLT 336 11/10/2023 0819   MCV 77.6 (L) 11/10/2023 0819   NEUTROABS 9.0 (H) 11/10/2023 0819   LYMPHSABS 1.3 11/10/2023 0819   MONOABS 1.3 (H) 11/10/2023 0819   EOSABS 0.3 11/10/2023 0819   BASOSABS 0.1 11/10/2023 0819   Comprehensive Metabolic Panel:    Component Value Date/Time   NA 131 (L) 11/10/2023 0819   NA 136 06/10/2022 1152   K 3.4 (L) 11/10/2023 0819   CL 92 (L) 11/10/2023 0819   CO2 29 11/10/2023 0819   BUN 9 11/10/2023 0819   BUN 7 (L) 06/10/2022 1152   CREATININE 0.55 11/10/2023 0819   GLUCOSE 142 (H) 11/10/2023 0819   CALCIUM 9.2 11/10/2023 0819   AST 37 11/10/2023 0819   ALT 21 11/10/2023 0819   ALKPHOS 265 (H) 11/10/2023 0819   BILITOT 0.9 11/10/2023 0819   PROT 7.5 11/10/2023 0819   PROT 7.6 06/10/2022 1152   ALBUMIN 3.5 11/10/2023 0819   ALBUMIN 4.6 06/10/2022 1152    RADIOGRAPHIC STUDIES: CT Angio Chest PE W/Cm &/Or Wo Cm  Result Date: 10/29/2023 CORONARY ARTERIES: Pulmonary  embolism (PE) suspected, high prob Lower chest/epigastric pain, SOB, hx breast cancer; Abdominal pain, acute, nonlocalized Lower chest/epigastric pain, SOB, hx breast cancer EXAM: CT ANGIOGRAPHY CHEST CT ABDOMEN AND PELVIS WITH CONTRAST TECHNIQUE: Multidetector CT imaging of the chest was performed using the standard protocol during bolus administration of intravenous contrast. Multiplanar CT image reconstructions and MIPs were obtained to evaluate the vascular anatomy. Multidetector CT imaging of the abdomen and pelvis was performed using the standard protocol during bolus administration of intravenous contrast. RADIATION DOSE REDUCTION: This exam was performed according to the departmental dose-optimization program which includes automated exposure control, adjustment of the mA and/or kV according to patient size and/or use of iterative reconstruction technique. CONTRAST:  OMNIPAQUE  IOHEXOL 350 MG/ML SOLN COMPARISON:  None Available. FINDINGS: CTA CHEST FINDINGS Cardiovascular: There is a small volume occlusive thrombus in the distal segmental/proximal subsegmental pulmonary artery branch to the right lower lobe. No evidence of right heart strain no lung infarction. Mild cardiomegaly. No pericardial effusion. No aortic aneurysm. There is dilation of the main pulmonary trunk measuring up to 3.1 cm, which is nonspecific but can be seen with pulmonary artery hypertension. Mediastinum/Nodes: Visualized thyroid gland appears grossly unremarkable. No solid / cystic mediastinal masses. The esophagus is nondistended precluding optimal assessment. No axillary, mediastinal or hilar lymphadenopathy by size criteria. Lungs/Pleura: The central tracheo-bronchial tree is patent. There are dependent changes and patchy areas of linear, plate-like atelectasis and/or scarring throughout bilateral lungs. No mass or consolidation. No pleural effusion or pneumothorax. No suspicious lung nodules. Musculoskeletal: The visualized  soft tissues of the chest wall are grossly unremarkable. No suspicious osseous lesions. There are mild multilevel degenerative changes in the visualized spine. Review of the MIP images confirms the above findings. CT ABDOMEN and PELVIS FINDINGS Hepatobiliary: The liver is normal in size. Non-cirrhotic configuration. There are multiple ill-defined hypoattenuating liver lesions occupying 10-20% of the total surface of liver parenchyma, compatible with metastases. The largest such lesion is in the caudate lobe measuring up to 2.8 x 3.8 cm. No intrahepatic or extrahepatic bile duct dilation. No calcified gallstones. Normal gallbladder wall thickness. No pericholecystic inflammatory changes. Pancreas: There is an ill-defined approximately 2.0 x 2.9 cm hypoattenuating lesion centered in the pancreatic neck/proximal body region. There is subtle prominence of upstream main pancreatic duct. Pancreas is otherwise essentially unremarkable. No peripancreatic fat stranding. Spleen: Within normal limits. No focal lesion. Adrenals/Urinary Tract: Adrenal glands are unremarkable. No suspicious renal mass. No hydronephrosis. No renal or ureteric calculi. Unremarkable urinary bladder. Stomach/Bowel: No disproportionate dilation of the small or large bowel loops. No evidence of abnormal bowel wall thickening or inflammatory changes. The appendix is unremarkable. Vascular/Lymphatic: No ascites or pneumoperitoneum. No abdominal or pelvic lymphadenopathy, by size criteria. No aneurysmal dilation of the major abdominal arteries. 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 Reproductive: Normal-sized retroverted uterus, not well evaluated on this exam. No large adnexal mass seen. Other: There is a tiny fat containing umbilical hernia. The soft tissues and abdominal wall are otherwise unremarkable. Musculoskeletal: No  suspicious osseous lesions. There are mild multilevel degenerative changes in the visualized spine. Review of the MIP images confirms the above findings. IMPRESSION: 1. There is a small volume occlusive thrombus in the distal segmental/proximal subsegmental pulmonary artery the right lung lower lobe. No right heart strain no lung infarction. 2. No lung mass, consolidation, pleural effusion or pneumothorax. 3. There are multiple ill-defined liver masses, compatible with metastases. 4. There is an ill-defined 2.0 x 2.9 cm hypoattenuating lesion in the pancreatic neck/proximal body which is favored neoplastic. Differential diagnosis includes primary pancreatic tumor with liver metastases versus metastatic pancreatic and liver lesions in patient with known history of breast carcinoma, less likely. Correlate with tumor markers and tissue sampling. 5. 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 6. Multiple other nonacute observations, as described above. Critical Value/emergent results were called by telephone at the time of interpretation on 10/29/2023 at 3:31 pm to provider [facility] DAVIS , who verbally acknowledged these results. Electronically Signed  By: Jules Schick M.D.   On: 10/29/2023 15:39   CT ABDOMEN PELVIS W CONTRAST  Result Date: 10/29/2023 CORONARY ARTERIES: Pulmonary embolism (PE) suspected, high prob Lower chest/epigastric pain, SOB, hx breast cancer; Abdominal pain, acute, nonlocalized Lower chest/epigastric pain, SOB, hx breast cancer EXAM: CT ANGIOGRAPHY CHEST CT ABDOMEN AND PELVIS WITH CONTRAST TECHNIQUE: Multidetector CT imaging of the chest was performed using the standard protocol during bolus administration of intravenous contrast. Multiplanar CT image reconstructions and MIPs were obtained to evaluate the vascular anatomy. Multidetector CT imaging of the abdomen and pelvis  was performed using the standard protocol during bolus administration of intravenous contrast. RADIATION DOSE REDUCTION: This exam was performed according to the departmental dose-optimization program which includes automated exposure control, adjustment of the mA and/or kV according to patient size and/or use of iterative reconstruction technique. CONTRAST:  OMNIPAQUE IOHEXOL 350 MG/ML SOLN COMPARISON:  None Available. FINDINGS: CTA CHEST FINDINGS Cardiovascular: There is a small volume occlusive thrombus in the distal segmental/proximal subsegmental pulmonary artery branch to the right lower lobe. No evidence of right heart strain no lung infarction. Mild cardiomegaly. No pericardial effusion. No aortic aneurysm. There is dilation of the main pulmonary trunk measuring up to 3.1 cm, which is nonspecific but can be seen with pulmonary artery hypertension. Mediastinum/Nodes: Visualized thyroid gland appears grossly unremarkable. No solid / cystic mediastinal masses. The esophagus is nondistended precluding optimal assessment. No axillary, mediastinal or hilar lymphadenopathy by size criteria. Lungs/Pleura: The central tracheo-bronchial tree is patent. There are dependent changes and patchy areas of linear, plate-like atelectasis and/or scarring throughout bilateral lungs. No mass or consolidation. No pleural effusion or pneumothorax. No suspicious lung nodules. Musculoskeletal: The visualized soft tissues of the chest wall are grossly unremarkable. No suspicious osseous lesions. There are mild multilevel degenerative changes in the visualized spine. Review of the MIP images confirms the above findings. CT ABDOMEN and PELVIS FINDINGS Hepatobiliary: The liver is normal in size. Non-cirrhotic configuration. There are multiple ill-defined hypoattenuating liver lesions occupying 10-20% of the total surface of liver parenchyma, compatible with metastases. The largest such lesion is in the caudate lobe measuring up to  2.8 x 3.8 cm. No intrahepatic or extrahepatic bile duct dilation. No calcified gallstones. Normal gallbladder wall thickness. No pericholecystic inflammatory changes. Pancreas: There is an ill-defined approximately 2.0 x 2.9 cm hypoattenuating lesion centered in the pancreatic neck/proximal body region. There is subtle prominence of upstream main pancreatic duct. Pancreas is otherwise essentially unremarkable. No peripancreatic fat stranding. Spleen: Within normal limits. No focal lesion. Adrenals/Urinary Tract: Adrenal glands are unremarkable. No suspicious renal mass. No hydronephrosis. No renal or ureteric calculi. Unremarkable urinary bladder. Stomach/Bowel: No disproportionate dilation of the small or large bowel loops. No evidence of abnormal bowel wall thickening or inflammatory changes. The appendix is unremarkable. Vascular/Lymphatic: No ascites or pneumoperitoneum. No abdominal or pelvic lymphadenopathy, by size criteria. No aneurysmal dilation of the major abdominal arteries. 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 Reproductive: Normal-sized retroverted uterus, not well evaluated on this exam. No large adnexal mass seen. Other: There is a tiny fat containing umbilical hernia. The soft tissues and abdominal wall are otherwise unremarkable. Musculoskeletal: No suspicious osseous lesions. There are mild multilevel degenerative changes in the visualized spine. Review of the MIP images confirms the above findings. IMPRESSION: 1. There is a small volume occlusive thrombus in the distal segmental/proximal subsegmental pulmonary artery the  right lung lower lobe. No right heart strain no lung infarction. 2. No lung mass, consolidation, pleural effusion or pneumothorax. 3. There are multiple ill-defined liver masses, compatible with metastases. 4. There is an ill-defined 2.0 x 2.9 cm  hypoattenuating lesion in the pancreatic neck/proximal body which is favored neoplastic. Differential diagnosis includes primary pancreatic tumor with liver metastases versus metastatic pancreatic and liver lesions in patient with known history of breast carcinoma, less likely. Correlate with tumor markers and tissue sampling. 5. 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 6. Multiple other nonacute observations, as described above. Critical Value/emergent results were called by telephone at the time of interpretation on 10/29/2023 at 3:31 pm to provider [facility] DAVIS , who verbally acknowledged these results. Electronically Signed   By: Jules Schick M.D.   On: 10/29/2023 15:39  PERFORMANCE STATUS (ECOG) : {CHL ONC ECOG WG:[id number]}  Review of Systems Unless otherwise noted, a complete review of systems is negative.  Physical Exam General: NAD Cardiovascular: regular rate and rhythm Pulmonary: clear ant fields Abdomen: soft, nontender, + bowel sounds Extremities: no edema, no joint deformities Skin: no rashes Neurological: Alert and oriented x3  IMPRESSION: *** I introduced myself, Ginelle Bays RN, and Palliative's role in collaboration with the oncology team. Concept of Palliative Care was introduced as specialized medical care for people and their families living with serious illness.  It focuses on providing relief from the symptoms and stress of a serious illness.  The goal is to improve quality of life for both the patient and the family. Values and goals of care important to patient and family were attempted to be elicited.    We discussed *** current illness and what it means in the larger context of *** on-going co-morbidities. Natural disease trajectory and expectations were discussed.  I discussed the importance of continued conversation with family and their medical  providers regarding overall plan of care and treatment options, ensuring decisions are within the context of the patients values and GOCs.  PLAN: Established therapeutic relationship. Education provided on palliative's role in collaboration with their Oncology/Radiation team. I will plan to see patient back in 2-4 weeks in collaboration to other oncology appointments.    Patient expressed understanding and was in agreement with this plan. She also understands that She can call the clinic at any time with any questions, concerns, or complaints.   Thank you for your referral and allowing Palliative to assist in Mrs. Ikran D Hankey's care.   Number and complexity of problems addressed: ***HIGH - 1 or more chronic illnesses with SEVERE exacerbation, progression, or side effects of treatment - advanced cancer, pain. Any controlled substances utilized were prescribed in the context of palliative care.   Visit consisted of counseling and education dealing with the complex and emotionally intense issues of symptom management and palliative care in the setting of serious and potentially life-threatening illness.  Signed by: Willette Alma, AGPCNP-BC Palliative Medicine Team/[facility]   *Please note that this is a verbal dictation therefore any spelling or grammatical errors are due to the "[employer] One" system interpretation.

## 2023-11-24 NOTE — Progress Notes (Unsigned)
Patient Care Team: [name], [name] as PCP - General (Nurse Practitioner) [name], MD as Consulting Physician (Hematology and Oncology) [name], MD as Consulting Physician (Radiation Oncology) [name], MD as Consulting Physician (General Surgery) [name], MD as Consulting Physician (Hematology and Oncology)  Clinic Day:  11/25/2023  Referring physician: [name], PA  ASSESSMENT & PLAN:   Assessment & Plan: Pancreatic cancer [facility]) -stage IV with liver mets -diagnosed in 10/2023 -Patient has a history of breast cancer. She presents with dyspnea, chest discomfort, decreased appetite, and weight loss. She also reports new onset back pain. -CT showed a 2.9cm mass in pancreatic neck/body, and multiple liver mets. liver biopsy confirmed adenocarcinoma  -The aggressive nature of pancreatic cancer was reviewed, and the incurable nature of her disease due to diffuse liver metastasis. -she started first line chemo FOLFIRINOX chemotherapy regimen on 11/12, with dose reduction for first cycle due to poor PS -Perform molecular testing on tumor tissue to identify potential targeted therapy options. -Consider genetic testing for BRCA1/2 mutations, which could open up treatment options such as PARP inhibitors. -did have infusion reaction with first round FOLFIRINOX.  -was able to complete infusion after rescue medications given.  -premeds added to every cycle to prevent future hypersensitivity reactions.     PLAN: Labs reviewed  -CBC showing WBC 3.4; Hgb 12.6; Hct 38.0; Plt 421; Anc 1.2 -CMP - K 3.4; glucose 121; BUN 10; Creatinine 0.50; eGFR > 60; Ca 9.4; AST 118; ALT 91; Alk phos 337; T.bili 0.6.   -The patient will see palliative care and nutritionist today.  -Discussed lab results with Dr. Mosetta Putt.  Okay to proceed with treatment at slower rate and current dose. -Premeds ordered to prevent hypersensitivity.  Meds include atropine for abdominal cramping. -G-CSF to be  administered on day 3 of treatment cycle. Refill Eliquis 5 mg twice daily.  Sent to [facility] outpatient pharmacy. -labs/flush, follow up, and treatment as scheduled 12/09/2023.   The patient understands the plans discussed today and is in agreement with them.  She knows to contact our office if she develops concerns prior to her next appointment.  I provided 30 minutes of face-to-face time during this encounter and > 50% was spent counseling as documented under my assessment and plan.    Carlean Jews, NP  [facility] - A DEPT OF [facility] [address] [place] [address] Dept: [phone] Dept Fax: [phone]   No orders of the defined types were placed in this encounter.     CHIEF COMPLAINT:  CC: malignant neoplasm of body of pancreas  Current Treatment:  first line FOLFIRINOX  INTERVAL HISTORY:  Anija is here today for repeat clinical assessment. She started this treatment on 11/10/2023 with dose reduction for first dose because of poor PS. She did have infusion reaction and subsequently recovered completely.  Premedications added hide results reaction and abdominal pain.  Patient seen nutritionist today.  She denies fevers or chills. She denies pain. Her appetite is improving. Her weight has decreased 9 pounds over last 2 weeks .  I have reviewed the past medical history, past surgical history, social history and family history with the patient and they are unchanged from previous note.  ALLERGIES:  is allergic to irinotecan liposome.  MEDICATIONS:  Current Outpatient Medications  Medication Sig Dispense Refill   apixaban (ELIQUIS) 5 MG TABS tablet Take 1 tablet (5 mg total) by mouth 2 (two) times daily.  60 tablet 3   cetirizine (ZYRTEC) 10 MG chewable tablet Chew 10 mg by mouth daily.     lidocaine-prilocaine (EMLA) cream Apply to affected area once 30 g 3   losartan (COZAAR) 25 MG tablet  Take 1 tablet (25 mg total) by mouth daily for kidney protection. 90 tablet 3   ondansetron (ZOFRAN) 8 MG tablet Take 1 tablet (8 mg total) by mouth every 8 (eight) hours as needed for nausea or vomiting. Start on the third day after irinotecan 30 tablet 1   prochlorperazine (COMPAZINE) 10 MG tablet Take 1 tablet (10 mg total) by mouth every 6 (six) hours as needed for nausea or vomiting. 30 tablet 1   No current facility-administered medications for this visit.   Facility-Administered Medications Ordered in Other Visits  Medication Dose Route Frequency Provider Last Rate Last Admin   dextrose 5 % solution   Intravenous Continuous [name], MD       fluorouracil (ADRUCIL) 3,950 mg in sodium chloride 0.9 % 71 mL chemo infusion  2,000 mg/m2 (Treatment Plan Recorded) Intravenous 1 day or 1 dose [name], MD       heparin lock flush 100 unit/mL  500 Units Intracatheter Once PRN [name], MD       irinotecan LIPOSOME (ONIVYDE) 98.9 mg in sodium chloride 0.9 % 500 mL chemo infusion  50 mg/m2 (Treatment Plan Recorded) Intravenous Once [name], MD 250 mL/hr at 11/25/23 1146 Rate Change at 11/25/23 1146   leucovorin 788 mg in dextrose 5 % 250 mL infusion  400 mg/m2 (Treatment Plan Recorded) Intravenous Once [name], MD       oxaliplatin (ELOXATIN) 120 mg in dextrose 5 % 500 mL chemo infusion  60 mg/m2 (Treatment Plan Recorded) Intravenous Once [name], MD       sodium chloride flush (NS) 0.9 % injection 10 mL  10 mL Intracatheter PRN [name], MD        HISTORY OF PRESENT ILLNESS:   Oncology History  Malignant neoplasm of overlapping sites of left breast in female, estrogen receptor positive (HCC)  08/04/2018 Initial Diagnosis   Screening mammogram detected left breast mass at 9:30 position 1.2 x 1.2 x 1.1 cm.  Closer to the nipple at 9:30 position 0.8 cm lesion span 3.6 cm and a 1.5 cm apart.  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 at 9:30 position 2 cm from nipple, at 1 cm from nipple PASH, lymph node benign, ER 90%, PR 95%, Ki-67 20%, HER-2 negative by IHC 1+, T1CN0 stage Ia AJCC 8   09/29/2018 Surgery   Left lumpectomy: Grade 3 IDC, 1.7 cm, margins negative, lymphovascular invasion present, 0/2 lymph nodes negative, T1c N0 stage Ia   10/06/2018 Cancer Staging   Staging form: Breast, AJCC 8th Edition - Pathologic: Stage IA (pT1c, pN0(sn), cM0, G3, ER+, PR+, HER2-) - Signed by [name], MD on 10/06/2018   10/22/2018 Oncotype testing   Oncotype score 13: Distant recurrence at 9 years with hormone therapy alone 4%   11/12/2018 - 12/27/2018 Radiation Therapy   Adjuvant radiation therapy   12/27/2018 -  Anti-estrogen oral therapy   Antiestrogen therapy with letrozole 2.5 mg daily   Pancreatic cancer (HCC)  11/03/2023 Initial Diagnosis   Pancreatic cancer (HCC)   11/11/2023 -  Chemotherapy   Patient is on Treatment Plan : PANCREAS NALIRIFOX D1, 15 Q28D  REVIEW OF SYSTEMS:   Constitutional: Denies fevers, chills. Did have 9 pound weight loss since initial treatment  Eyes: Denies blurriness of vision Ears, nose, mouth, throat, and face: Denies mucositis or sore throat Respiratory: Denies cough, dyspnea or wheezes Cardiovascular: Denies palpitation, chest discomfort or lower extremity swelling Gastrointestinal:  Denies nausea, heartburn or change in bowel habits.  Improved appetite. Skin: Denies abnormal skin rashes Lymphatics: Denies new lymphadenopathy or easy bruising Neurological:Denies numbness, tingling or new weaknesses Behavioral/Psych: Mood is stable, no new changes  All other systems were reviewed with the patient and are negative.   VITALS:   Today's Vitals   11/25/23 0900  BP: (!) 100/51  Pulse: (!) 106  Resp: 18  Temp: 98.2 F (36.8 C)  TempSrc: Tympanic  SpO2: 98%  Weight: 175 lb 6.4 oz (79.6 kg)  Height: 5' 1.5" (1.562 m)   Body mass index is 32.6 kg/m.   Wt  Readings from Last 3 Encounters:  11/25/23 175 lb 6.4 oz (79.6 kg)  11/11/23 184 lb 8 oz (83.7 kg)  11/10/23 185 lb 4.8 oz (84.1 kg)    Body mass index is 32.6 kg/m.  Performance status (ECOG): 1 - Symptomatic but completely ambulatory  PHYSICAL EXAM:   GENERAL:alert, no distress and comfortable SKIN: skin color, texture, turgor are normal, no rashes or significant lesions EYES: normal, Conjunctiva are pink and non-injected, sclera clear OROPHARYNX:no exudate, no erythema and lips, buccal mucosa, and tongue normal  NECK: supple, thyroid normal size, non-tender, without nodularity LYMPH:  no palpable lymphadenopathy in the cervical, axillary or inguinal LUNGS: clear to auscultation and percussion with normal breathing effort HEART: regular rate & rhythm and no murmurs and no lower extremity edema ABDOMEN:abdomen soft, non-tender and normal bowel sounds Musculoskeletal:no cyanosis of digits and no clubbing  NEURO: alert & oriented x 3 with fluent speech, no focal motor/sensory deficits  LABORATORY DATA:  I have reviewed the data as listed    Component Value Date/Time   NA 131 (L) 11/25/2023 0833   NA 136 06/10/2022 1152   K 3.4 (L) 11/25/2023 0833   CL 92 (L) 11/25/2023 0833   CO2 30 11/25/2023 0833   GLUCOSE 121 (H) 11/25/2023 0833   BUN 10 11/25/2023 0833   BUN 7 (L) 06/10/2022 1152   CREATININE 0.50 11/25/2023 0833   CALCIUM 9.4 11/25/2023 0833   PROT 7.0 11/25/2023 0833   PROT 7.6 06/10/2022 1152   ALBUMIN 3.5 11/25/2023 0833   ALBUMIN 4.6 06/10/2022 1152   AST 118 (H) 11/25/2023 0833   ALT 91 (H) 11/25/2023 0833   ALKPHOS 337 (H) 11/25/2023 0833   BILITOT 0.6 11/25/2023 0833   GFRNONAA >60 11/25/2023 4098    Lab Results  Component Value Date   WBC 3.4 (L) 11/25/2023   NEUTROABS 1.2 (L) 11/25/2023   HGB 12.6 11/25/2023   HCT 38.0 11/25/2023   MCV 75.5 (L) 11/25/2023   PLT 421 (H) 11/25/2023     RADIOGRAPHIC STUDIES: IR IMAGING GUIDED PORT  INSERTION  Result Date: 11/03/2023 CLINICAL DATA:  Metastatic pancreas cancer, access for chemotherapy EXAM: RIGHT INTERNAL JUGULAR SINGLE LUMEN POWER PORT CATHETER INSERTION Date:  11/03/2023 11/03/2023 12:27 pm Radiologist:  Judie Petit. Ruel Favors, MD Guidance:  Ultrasound and fluoroscopic MEDICATIONS: 1% lidocaine local with epinephrine ANESTHESIA/SEDATION: Versed 1.5 mg IV; Fentanyl 75 mcg IV; Moderate Sedation Time:  27 minutes The patient was continuously monitored during the procedure by the interventional radiology nurse under my direct supervision. FLUOROSCOPY: 0 minutes, 39 seconds (2.0 mGy)  COMPLICATIONS: None immediate. CONTRAST:  None. PROCEDURE: Informed consent was obtained from the patient following explanation of the procedure, risks, benefits and alternatives. The patient understands, agrees and consents for the procedure. All questions were addressed. A time out was performed. Maximal barrier sterile technique utilized including caps, mask, sterile gowns, sterile gloves, large sterile drape, hand hygiene, and 2% chlorhexidine scrub. Under sterile conditions and local anesthesia, right internal jugular micropuncture venous access was performed. Access was performed with ultrasound. Images were obtained for documentation of the patent right internal jugular vein. A guide wire was inserted followed by a transitional dilator. This allowed insertion of a guide wire and catheter into the IVC. Measurements were obtained from the SVC / RA junction back to the right IJ venotomy site. In the right infraclavicular chest, a subcutaneous pocket was created over the second anterior rib. This was done under sterile conditions and local anesthesia. 1% lidocaine with epinephrine was utilized for this. A 2.5 cm incision was made in the skin. Blunt dissection was performed to create a subcutaneous pocket over the right pectoralis major muscle. The pocket was flushed with saline vigorously. There was adequate hemostasis.  The port catheter was assembled and checked for leakage. The port catheter was secured in the pocket with two retention sutures. The tubing was tunneled subcutaneously to the right venotomy site and inserted into the SVC/RA junction through a valved peel-away sheath. Position was confirmed with fluoroscopy. Images were obtained for documentation. The patient tolerated the procedure well. No immediate complications. Incisions were closed in a two layer fashion with 4 - 0 Vicryl suture. Dermabond was applied to the skin. The port catheter was accessed, blood was aspirated followed by saline and heparin flushes. Needle was removed. A dry sterile dressing was applied. IMPRESSION: Ultrasound and fluoroscopically guided right internal jugular single lumen power port catheter insertion. Tip in the SVC/RA junction. Catheter ready for use. Electronically Signed   By: Judie Petit.  Shick M.D.   On: 11/03/2023 12:37   US BIOPSY (LIVER)  Result Date: 11/02/2023 INDICATION: Pancreatic mass and multiple liver lesions. EXAM: ULTRASOUND GUIDED CORE BIOPSY OF LIVER MASS MEDICATIONS: None. ANESTHESIA/SEDATION: Moderate (conscious) sedation was employed during this procedure. A total of Versed 2.0 mg and Fentanyl 100 mcg was administered intravenously. Moderate Sedation Time: 10 minutes. The patient's level of consciousness and vital signs were monitored continuously by radiology nursing throughout the procedure under my direct supervision. PROCEDURE: The procedure, risks, benefits, and alternatives were explained to the patient. Questions regarding the procedure were encouraged and answered. The patient understands and consents to the procedure. A time-out was performed prior to initiating the procedure. Liver lesions were localized by ultrasound. The abdominal wall was prepped with chlorhexidine in a sterile fashion, and a sterile drape was applied covering the operative field. A sterile gown and sterile gloves were used for the  procedure. Local anesthesia was provided with 1% Lidocaine. Under ultrasound guidance, a 17 gauge trocar needle was advanced into a lesion within the left lobe of the liver. After confirming needle tip position, coaxial 18 gauge core biopsy samples were obtained. Three separate core biopsy samples were obtained and submitted in formalin. Gel-Foam pledgets were advanced through the outer needle prior to its retraction and removal. Additional ultrasound was performed. COMPLICATIONS: None immediate. FINDINGS: Multiple mass lesions are seen throughout the liver. The largest and most accessible was in the anterior subcapsular left lobe measuring approximately 3.1 x 3.6 cm in greatest diameter. Solid core biopsy samples were obtained. IMPRESSION: Ultrasound-guided  core biopsy performed of a lesion within the left lobe of the liver measuring approximately 3.6 cm in greatest diameter. Electronically Signed   By: Irish Lack M.D.   On: 11/02/2023 15:19   VAS [place] LOWER EXTREMITY VENOUS (DVT)  Result Date: 10/31/2023  Lower Venous DVT Study Patient Name:  DELIYAH TEMKIN  Date of Exam:   10/30/2023 Medical Rec #: [id number]         Accession #:    [id number] Date of Birth: Feb 11, 1961         Patient Gender: F Patient Age:   41 years Exam Location:  [facility] Procedure:      VAS [place] LOWER EXTREMITY VENOUS (DVT) Referring Phys: Kathlen Mody --------------------------------------------------------------------------------  Indications: Pulmonary embolism.  Risk Factors: Hx of cancer, newly diagnosed pancreatic and liver lesions. Comparison Study: No previous exams Performing Technologist: Jody Hill RVT, RDMS  Examination Guidelines: A complete evaluation includes B-mode imaging, spectral Doppler, color Doppler, and power Doppler as needed of all accessible portions of each vessel. Bilateral testing is considered an integral part of a complete examination. Limited examinations for reoccurring indications may be  performed as noted. The reflux portion of the exam is performed with the patient in reverse Trendelenburg.  +---------+---------------+---------+-----------+----------+--------------+ RIGHT    CompressibilityPhasicitySpontaneityPropertiesThrombus Aging +---------+---------------+---------+-----------+----------+--------------+ CFV      Full           Yes      Yes                                 +---------+---------------+---------+-----------+----------+--------------+ SFJ      Full                                                        +---------+---------------+---------+-----------+----------+--------------+ FV Prox  Full           Yes      Yes                                 +---------+---------------+---------+-----------+----------+--------------+ FV Mid   Full           Yes      Yes                                 +---------+---------------+---------+-----------+----------+--------------+ FV DistalFull           Yes      Yes                                 +---------+---------------+---------+-----------+----------+--------------+ PFV      Full                                                        +---------+---------------+---------+-----------+----------+--------------+ POP      Full           Yes      Yes                                 +---------+---------------+---------+-----------+----------+--------------+  PTV      Full                                                        +---------+---------------+---------+-----------+----------+--------------+ PERO     Full                                                        +---------+---------------+---------+-----------+----------+--------------+   +---------+---------------+---------+-----------+----------+--------------+ LEFT     CompressibilityPhasicitySpontaneityPropertiesThrombus Aging +---------+---------------+---------+-----------+----------+--------------+ CFV      Full            Yes      Yes                                 +---------+---------------+---------+-----------+----------+--------------+ SFJ      Full                                                        +---------+---------------+---------+-----------+----------+--------------+ FV Prox  Full           Yes      Yes                                 +---------+---------------+---------+-----------+----------+--------------+ FV Mid   Full           Yes      Yes                                 +---------+---------------+---------+-----------+----------+--------------+ FV DistalFull           Yes      Yes                                 +---------+---------------+---------+-----------+----------+--------------+ PFV      Full                                                        +---------+---------------+---------+-----------+----------+--------------+ POP      Full           Yes      Yes                                 +---------+---------------+---------+-----------+----------+--------------+ PTV      Full                                                        +---------+---------------+---------+-----------+----------+--------------+  PERO     Full                                                        +---------+---------------+---------+-----------+----------+--------------+     Summary: BILATERAL: - No evidence of deep vein thrombosis seen in the lower extremities, bilaterally. -No evidence of popliteal cyst, bilaterally.   *See table(s) above for measurements and observations. Electronically signed by Coral Else MD on 10/31/2023 at 10:49:39 AM.    Final    ECHOCARDIOGRAM COMPLETE  Result Date: 10/30/2023    ECHOCARDIOGRAM REPORT   Patient Name:   BARBARAJO JAGGER Date of Exam: 10/30/2023 Medical Rec #:  [id number]        Height:       61.5 in Accession #:    [id number]       Weight:       197.0 lb Date of Birth:  09/13/1961        BSA:          1.888 m  Patient Age:    62 years         BP:           116/75 mmHg Patient Gender: F                HR:           69 bpm. Exam Location:  Inpatient Procedure: 2D Echo, Color Doppler, Cardiac Doppler and Intracardiac            Opacification Agent Indications:    Pulmonary embolus  History:        Patient has no prior history of Echocardiogram examinations.                 Risk Factors:Diabetes and Hypertension. CA, hx of radiation                 therapy.  Sonographer:    Milda Smart Referring Phys: Herminio Heads  Sonographer Comments: Image acquisition challenging due to patient body habitus. Suboptimal images IMPRESSIONS  1. Poor acoustic windows limit study, even with use of Definity.  2. Left ventricular ejection fraction, by estimation, is 65 to 70%. The left ventricle has normal function. The left ventricle has no regional wall motion abnormalities. Left ventricular diastolic parameters were normal.  3. Right ventricular systolic function is normal. The right ventricular size is normal.  4. The mitral valve is normal in structure. Trivial mitral valve regurgitation.  5. The aortic valve was not well visualized. Aortic valve regurgitation is not visualized.  6. The inferior vena cava is normal in size with greater than 50% respiratory variability, suggesting right atrial pressure of 3 mmHg. FINDINGS  Left Ventricle: Left ventricular ejection fraction, by estimation, is 65 to 70%. The left ventricle has normal function. The left ventricle has no regional wall motion abnormalities. Definity contrast agent was given IV to delineate the left ventricular  endocardial borders. The left ventricular internal cavity size was normal in size. There is no left ventricular hypertrophy. Left ventricular diastolic parameters were normal. Right Ventricle: The right ventricular size is normal. Right vetricular wall thickness was not assessed. Right ventricular systolic function is normal. Left Atrium: Left atrial size was  normal in size. Right Atrium: Right atrial size was normal in size. Pericardium: There is no evidence  of pericardial effusion. Mitral Valve: The mitral valve is normal in structure. Trivial mitral valve regurgitation. Tricuspid Valve: The tricuspid valve is normal in structure. Tricuspid valve regurgitation is trivial. Aortic Valve: The aortic valve was not well visualized. Aortic valve regurgitation is not visualized. Pulmonic Valve: The pulmonic valve was normal in structure. Pulmonic valve regurgitation is not visualized. Aorta: The aortic root and ascending aorta are structurally normal, with no evidence of dilitation. Venous: The inferior vena cava is normal in size with greater than 50% respiratory variability, suggesting right atrial pressure of 3 mmHg. IAS/Shunts: No atrial level shunt detected by color flow Doppler.  LEFT VENTRICLE PLAX 2D LVIDd:         4.20 cm   Diastology LVIDs:         2.60 cm   LV e' medial:    7.40 cm/s LV PW:         0.90 cm   LV E/e' medial:  8.7 LV IVS:        0.80 cm   LV e' lateral:   11.70 cm/s LVOT diam:     2.00 cm   LV E/e' lateral: 5.5 LV SV:         79 LV SV Index:   42 LVOT Area:     3.14 cm  RIGHT VENTRICLE RV S prime:     14.40 cm/s TAPSE (M-mode): 2.4 cm LEFT ATRIUM             Index        RIGHT ATRIUM          Index LA diam:        2.70 cm 1.43 cm/m   RA Area:     9.08 cm LA Vol (A2C):   28.6 ml 15.15 ml/m  RA Volume:   17.70 ml 9.37 ml/m LA Vol (A4C):   23.6 ml 12.50 ml/m LA Biplane Vol: 27.2 ml 14.41 ml/m  AORTIC VALVE LVOT Vmax:   135.00 cm/s LVOT Vmean:  92.800 cm/s LVOT VTI:    0.253 m  AORTA Ao Root diam: 2.80 cm Ao Asc diam:  3.40 cm MITRAL VALVE MV Area (PHT): 2.75 cm    SHUNTS MV Decel Time: 276 msec    Systemic VTI:  0.25 m MV E velocity: 64.70 cm/s  Systemic Diam: 2.00 cm MV A velocity: 84.00 cm/s MV E/A ratio:  0.77 Dietrich Pates MD Electronically signed by Dietrich Pates MD Signature Date/Time: 10/30/2023/5:22:54 PM    Final    CT Angio Chest PE W/Cm  &/Or Wo Cm  Result Date: 10/29/2023 CORONARY ARTERIES: Pulmonary embolism (PE) suspected, high prob Lower chest/epigastric pain, SOB, hx breast cancer; Abdominal pain, acute, nonlocalized Lower chest/epigastric pain, SOB, hx breast cancer EXAM: CT ANGIOGRAPHY CHEST CT ABDOMEN AND PELVIS WITH CONTRAST TECHNIQUE: Multidetector CT imaging of the chest was performed using the standard protocol during bolus administration of intravenous contrast. Multiplanar CT image reconstructions and MIPs were obtained to evaluate the vascular anatomy. Multidetector CT imaging of the abdomen and pelvis was performed using the standard protocol during bolus administration of intravenous contrast. RADIATION DOSE REDUCTION: This exam was performed according to the departmental dose-optimization program which includes automated exposure control, adjustment of the mA and/or kV according to patient size and/or use of iterative reconstruction technique. CONTRAST:  OMNIPAQUE IOHEXOL 350 MG/ML SOLN COMPARISON:  None Available. FINDINGS: CTA CHEST FINDINGS Cardiovascular: There is a small volume occlusive thrombus in the distal segmental/proximal subsegmental pulmonary artery branch to the right  lower lobe. No evidence of right heart strain no lung infarction. Mild cardiomegaly. No pericardial effusion. No aortic aneurysm. There is dilation of the main pulmonary trunk measuring up to 3.1 cm, which is nonspecific but can be seen with pulmonary artery hypertension. Mediastinum/Nodes: Visualized thyroid gland appears grossly unremarkable. No solid / cystic mediastinal masses. The esophagus is nondistended precluding optimal assessment. No axillary, mediastinal or hilar lymphadenopathy by size criteria. Lungs/Pleura: The central tracheo-bronchial tree is patent. There are dependent changes and patchy areas of linear, plate-like atelectasis and/or scarring throughout bilateral lungs. No mass or consolidation. No pleural effusion or  pneumothorax. No suspicious lung nodules. Musculoskeletal: The visualized soft tissues of the chest wall are grossly unremarkable. No suspicious osseous lesions. There are mild multilevel degenerative changes in the visualized spine. Review of the MIP images confirms the above findings. CT ABDOMEN and PELVIS FINDINGS Hepatobiliary: The liver is normal in size. Non-cirrhotic configuration. There are multiple ill-defined hypoattenuating liver lesions occupying 10-20% of the total surface of liver parenchyma, compatible with metastases. The largest such lesion is in the caudate lobe measuring up to 2.8 x 3.8 cm. No intrahepatic or extrahepatic bile duct dilation. No calcified gallstones. Normal gallbladder wall thickness. No pericholecystic inflammatory changes. Pancreas: There is an ill-defined approximately 2.0 x 2.9 cm hypoattenuating lesion centered in the pancreatic neck/proximal body region. There is subtle prominence of upstream main pancreatic duct. Pancreas is otherwise essentially unremarkable. No peripancreatic fat stranding. Spleen: Within normal limits. No focal lesion. Adrenals/Urinary Tract: Adrenal glands are unremarkable. No suspicious renal mass. No hydronephrosis. No renal or ureteric calculi. Unremarkable urinary bladder. Stomach/Bowel: No disproportionate dilation of the small or large bowel loops. No evidence of abnormal bowel wall thickening or inflammatory changes. The appendix is unremarkable. Vascular/Lymphatic: No ascites or pneumoperitoneum. No abdominal or pelvic lymphadenopathy, by size criteria. No aneurysmal dilation of the major abdominal arteries. 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 Reproductive: Normal-sized retroverted uterus, not well evaluated on this exam. No large adnexal mass seen. Other: There is a tiny fat containing umbilical hernia. The soft tissues  and abdominal wall are otherwise unremarkable. Musculoskeletal: No suspicious osseous lesions. There are mild multilevel degenerative changes in the visualized spine. Review of the MIP images confirms the above findings. IMPRESSION: 1. There is a small volume occlusive thrombus in the distal segmental/proximal subsegmental pulmonary artery the right lung lower lobe. No right heart strain no lung infarction. 2. No lung mass, consolidation, pleural effusion or pneumothorax. 3. There are multiple ill-defined liver masses, compatible with metastases. 4. There is an ill-defined 2.0 x 2.9 cm hypoattenuating lesion in the pancreatic neck/proximal body which is favored neoplastic. Differential diagnosis includes primary pancreatic tumor with liver metastases versus metastatic pancreatic and liver lesions in patient with known history of breast carcinoma, less likely. Correlate with tumor markers and tissue sampling. 5. 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 6. Multiple other nonacute observations, as described above. Critical Value/emergent results were called by telephone at the time of interpretation on 10/29/2023 at 3:31 pm to provider [facility] DAVIS , who verbally acknowledged these results. Electronically Signed   By: Jules Schick M.D.   On: 10/29/2023 15:39   CT ABDOMEN PELVIS W CONTRAST  Result Date: 10/29/2023 CORONARY ARTERIES: Pulmonary embolism (PE) suspected, high prob Lower  chest/epigastric pain, SOB, hx breast cancer; Abdominal pain, acute, nonlocalized Lower chest/epigastric pain, SOB, hx breast cancer EXAM: CT ANGIOGRAPHY CHEST CT ABDOMEN AND PELVIS WITH CONTRAST TECHNIQUE: Multidetector CT imaging of the chest was performed using the standard protocol during bolus administration of intravenous contrast. Multiplanar CT image reconstructions and MIPs were obtained to evaluate the  vascular anatomy. Multidetector CT imaging of the abdomen and pelvis was performed using the standard protocol during bolus administration of intravenous contrast. RADIATION DOSE REDUCTION: This exam was performed according to the departmental dose-optimization program which includes automated exposure control, adjustment of the mA and/or kV according to patient size and/or use of iterative reconstruction technique. CONTRAST:  OMNIPAQUE IOHEXOL 350 MG/ML SOLN COMPARISON:  None Available. FINDINGS: CTA CHEST FINDINGS Cardiovascular: There is a small volume occlusive thrombus in the distal segmental/proximal subsegmental pulmonary artery branch to the right lower lobe. No evidence of right heart strain no lung infarction. Mild cardiomegaly. No pericardial effusion. No aortic aneurysm. There is dilation of the main pulmonary trunk measuring up to 3.1 cm, which is nonspecific but can be seen with pulmonary artery hypertension. Mediastinum/Nodes: Visualized thyroid gland appears grossly unremarkable. No solid / cystic mediastinal masses. The esophagus is nondistended precluding optimal assessment. No axillary, mediastinal or hilar lymphadenopathy by size criteria. Lungs/Pleura: The central tracheo-bronchial tree is patent. There are dependent changes and patchy areas of linear, plate-like atelectasis and/or scarring throughout bilateral lungs. No mass or consolidation. No pleural effusion or pneumothorax. No suspicious lung nodules. Musculoskeletal: The visualized soft tissues of the chest wall are grossly unremarkable. No suspicious osseous lesions. There are mild multilevel degenerative changes in the visualized spine. Review of the MIP images confirms the above findings. CT ABDOMEN and PELVIS FINDINGS Hepatobiliary: The liver is normal in size. Non-cirrhotic configuration. There are multiple ill-defined hypoattenuating liver lesions occupying 10-20% of the total surface of liver parenchyma, compatible with  metastases. The largest such lesion is in the caudate lobe measuring up to 2.8 x 3.8 cm. No intrahepatic or extrahepatic bile duct dilation. No calcified gallstones. Normal gallbladder wall thickness. No pericholecystic inflammatory changes. Pancreas: There is an ill-defined approximately 2.0 x 2.9 cm hypoattenuating lesion centered in the pancreatic neck/proximal body region. There is subtle prominence of upstream main pancreatic duct. Pancreas is otherwise essentially unremarkable. No peripancreatic fat stranding. Spleen: Within normal limits. No focal lesion. Adrenals/Urinary Tract: Adrenal glands are unremarkable. No suspicious renal mass. No hydronephrosis. No renal or ureteric calculi. Unremarkable urinary bladder. Stomach/Bowel: No disproportionate dilation of the small or large bowel loops. No evidence of abnormal bowel wall thickening or inflammatory changes. The appendix is unremarkable. Vascular/Lymphatic: No ascites or pneumoperitoneum. No abdominal or pelvic lymphadenopathy, by size criteria. No aneurysmal dilation of the major abdominal arteries. 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 Reproductive: Normal-sized retroverted uterus, not well evaluated on this exam. No large adnexal mass seen. Other: There is a tiny fat containing umbilical hernia. The soft tissues and abdominal wall are otherwise unremarkable. Musculoskeletal: No suspicious osseous lesions. There are mild multilevel degenerative changes in the visualized spine. Review of the MIP images confirms the above findings. IMPRESSION: 1. There is a small volume occlusive thrombus in the distal segmental/proximal subsegmental pulmonary artery the right lung lower lobe. No right heart strain no lung infarction. 2. No lung mass, consolidation, pleural effusion or pneumothorax. 3. There are multiple ill-defined liver masses,  compatible with  metastases. 4. There is an ill-defined 2.0 x 2.9 cm hypoattenuating lesion in the pancreatic neck/proximal body which is favored neoplastic. Differential diagnosis includes primary pancreatic tumor with liver metastases versus metastatic pancreatic and liver lesions in patient with known history of breast carcinoma, less likely. Correlate with tumor markers and tissue sampling. 5. 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 6. Multiple other nonacute observations, as described above. Critical Value/emergent results were called by telephone at the time of interpretation on 10/29/2023 at 3:31 pm to provider [facility] DAVIS , who verbally acknowledged these results. Electronically Signed   By: Jules Schick M.D.   On: 10/29/2023 15:39    Addendum I have seen the patient, examined her. I agree with the assessment and and plan and have edited the notes.   Helmut Muster has been feeling much better since her first cycle chemotherapy, with better appetite, eating, and energy.  Lab reviewed, she is slightly neutropenic from chemotherapy, adequate for treatment, will proceed second cycle today at the same dose due to neutropenia.  Plan to add G-CSF for next cycle.  All questions were answered.  I spent a total of 25 minutes for her visit today, more than 50% time on face-to-face counseling.  [name]  11/25/2023

## 2023-11-24 NOTE — Assessment & Plan Note (Signed)
-  stage IV with liver mets -diagnosed in 10/2023 -Patient has a history of breast cancer. She presents with dyspnea, chest discomfort, decreased appetite, and weight loss. She also reports new onset back pain. -CT showed a 2.9cm mass in pancreatic neck/body, and multiple liver mets. liver biopsy confirmed adenocarcinoma  -The aggressive nature of pancreatic cancer was reviewed, and the incurable nature of her disease due to diffuse liver metastasis. -she started first line chemo FOLFIRINOX chemotherapy regimen on 11/12, with dose reduction for first cycle due to poor PS -Perform molecular testing on tumor tissue to identify potential targeted therapy options. -Consider genetic testing for BRCA1/2 mutations, which could open up treatment options such as PARP inhibitors. -did have infusion reaction with first round FOLFIRINOX.  -was able to complete infusion after rescue medications given.  -premeds added to every cycle to prevent future hypersensitivity reactions.

## 2023-11-25 ENCOUNTER — Encounter: Payer: Self-pay | Admitting: Hematology

## 2023-11-25 ENCOUNTER — Inpatient Hospital Stay: Payer: 59 | Admitting: Nurse Practitioner

## 2023-11-25 ENCOUNTER — Other Ambulatory Visit: Payer: Self-pay

## 2023-11-25 ENCOUNTER — Other Ambulatory Visit (HOSPITAL_COMMUNITY): Payer: Self-pay

## 2023-11-25 ENCOUNTER — Other Ambulatory Visit: Payer: Self-pay | Admitting: Nurse Practitioner

## 2023-11-25 ENCOUNTER — Inpatient Hospital Stay: Payer: 59

## 2023-11-25 ENCOUNTER — Telehealth: Payer: Self-pay

## 2023-11-25 ENCOUNTER — Inpatient Hospital Stay (HOSPITAL_BASED_OUTPATIENT_CLINIC_OR_DEPARTMENT_OTHER): Payer: 59 | Admitting: Nurse Practitioner

## 2023-11-25 ENCOUNTER — Inpatient Hospital Stay: Payer: 59 | Admitting: Dietician

## 2023-11-25 ENCOUNTER — Inpatient Hospital Stay: Payer: 59 | Admitting: Licensed Clinical Social Worker

## 2023-11-25 VITALS — HR 98

## 2023-11-25 VITALS — BP 100/51 | HR 106 | Temp 98.2°F | Resp 18 | Ht 61.5 in | Wt 175.4 lb

## 2023-11-25 DIAGNOSIS — C259 Malignant neoplasm of pancreas, unspecified: Secondary | ICD-10-CM | POA: Diagnosis not present

## 2023-11-25 DIAGNOSIS — C251 Malignant neoplasm of body of pancreas: Secondary | ICD-10-CM

## 2023-11-25 DIAGNOSIS — Z95828 Presence of other vascular implants and grafts: Secondary | ICD-10-CM

## 2023-11-25 DIAGNOSIS — C787 Secondary malignant neoplasm of liver and intrahepatic bile duct: Secondary | ICD-10-CM | POA: Diagnosis not present

## 2023-11-25 DIAGNOSIS — Z5111 Encounter for antineoplastic chemotherapy: Secondary | ICD-10-CM | POA: Diagnosis not present

## 2023-11-25 LAB — CBC WITH DIFFERENTIAL (CANCER CENTER ONLY)
Abs Immature Granulocytes: 0.02 10*3/uL (ref 0.00–0.07)
Basophils Absolute: 0 10*3/uL (ref 0.0–0.1)
Basophils Relative: 1 %
Eosinophils Absolute: 0.2 10*3/uL (ref 0.0–0.5)
Eosinophils Relative: 5 %
HCT: 38 % (ref 36.0–46.0)
Hemoglobin: 12.6 g/dL (ref 12.0–15.0)
Immature Granulocytes: 1 %
Lymphocytes Relative: 35 %
Lymphs Abs: 1.2 10*3/uL (ref 0.7–4.0)
MCH: 25 pg — ABNORMAL LOW (ref 26.0–34.0)
MCHC: 33.2 g/dL (ref 30.0–36.0)
MCV: 75.5 fL — ABNORMAL LOW (ref 80.0–100.0)
Monocytes Absolute: 0.8 10*3/uL (ref 0.1–1.0)
Monocytes Relative: 23 %
Neutro Abs: 1.2 10*3/uL — ABNORMAL LOW (ref 1.7–7.7)
Neutrophils Relative %: 35 %
Platelet Count: 421 10*3/uL — ABNORMAL HIGH (ref 150–400)
RBC: 5.03 MIL/uL (ref 3.87–5.11)
RDW: 14.5 % (ref 11.5–15.5)
WBC Count: 3.4 10*3/uL — ABNORMAL LOW (ref 4.0–10.5)
nRBC: 0 % (ref 0.0–0.2)

## 2023-11-25 LAB — CMP (CANCER CENTER ONLY)
ALT: 91 U/L — ABNORMAL HIGH (ref 0–44)
AST: 118 U/L — ABNORMAL HIGH (ref 15–41)
Albumin: 3.5 g/dL (ref 3.5–5.0)
Alkaline Phosphatase: 337 U/L — ABNORMAL HIGH (ref 38–126)
Anion gap: 9 (ref 5–15)
BUN: 10 mg/dL (ref 8–23)
CO2: 30 mmol/L (ref 22–32)
Calcium: 9.4 mg/dL (ref 8.9–10.3)
Chloride: 92 mmol/L — ABNORMAL LOW (ref 98–111)
Creatinine: 0.5 mg/dL (ref 0.44–1.00)
GFR, Estimated: >60 mL/min (ref >60)
Glucose, Bld: 121 mg/dL — ABNORMAL HIGH (ref 70–99)
Potassium: 3.4 mmol/L — ABNORMAL LOW (ref 3.5–5.1)
Sodium: 131 mmol/L — ABNORMAL LOW (ref 135–145)
Total Bilirubin: 0.6 mg/dL (ref <1.2)
Total Protein: 7 g/dL (ref 6.5–8.1)

## 2023-11-25 LAB — GENETIC SCREENING ORDER

## 2023-11-25 MED ORDER — OXALIPLATIN CHEMO INJECTION 100 MG/20ML
60.0000 mg/m2 | Freq: Once | INTRAVENOUS | Status: AC
  Administered 2023-11-25: 120 mg via INTRAVENOUS
  Filled 2023-11-25: qty 24

## 2023-11-25 MED ORDER — ATROPINE SULFATE 1 MG/ML IV SOLN
0.5000 mg | Freq: Once | INTRAVENOUS | Status: AC | PRN
  Administered 2023-11-25: 0.5 mg via INTRAVENOUS
  Filled 2023-11-25: qty 1

## 2023-11-25 MED ORDER — FAMOTIDINE IN NACL 20-0.9 MG/50ML-% IV SOLN
20.0000 mg | Freq: Once | INTRAVENOUS | Status: AC
  Administered 2023-11-25: 20 mg via INTRAVENOUS
  Filled 2023-11-25: qty 50

## 2023-11-25 MED ORDER — PEGFILGRASTIM 6 MG/0.6ML ~~LOC~~ PSKT
6.0000 mg | PREFILLED_SYRINGE | Freq: Once | SUBCUTANEOUS | Status: DC
Start: 2023-11-25 — End: 2023-11-25

## 2023-11-25 MED ORDER — HEPARIN SOD (PORK) LOCK FLUSH 100 UNIT/ML IV SOLN: 500.0000 [IU] | Freq: Once | INTRAVENOUS | Status: DC | PRN

## 2023-11-25 MED ORDER — PALONOSETRON HCL INJECTION 0.25 MG/5ML
0.2500 mg | Freq: Once | INTRAVENOUS | Status: AC
  Administered 2023-11-25: 0.25 mg via INTRAVENOUS
  Filled 2023-11-25: qty 5

## 2023-11-25 MED ORDER — SODIUM CHLORIDE 0.9% FLUSH: 10.0000 mL | INTRAVENOUS | Status: DC | PRN

## 2023-11-25 MED ORDER — DEXAMETHASONE SODIUM PHOSPHATE 10 MG/ML IJ SOLN
10.0000 mg | Freq: Once | INTRAMUSCULAR | Status: AC
Start: 2023-11-25 — End: 2023-11-25
  Administered 2023-11-25: 10 mg via INTRAVENOUS
  Filled 2023-11-25: qty 1

## 2023-11-25 MED ORDER — APIXABAN 5 MG PO TABS
5.0000 mg | ORAL_TABLET | Freq: Two times a day (BID) | ORAL | 3 refills | Status: DC
  Filled 2023-11-25 (×2): qty 60, 30d supply, fill #0
  Filled 2023-12-28: qty 60, 30d supply, fill #1
  Filled 2024-02-08 (×2): qty 60, 30d supply, fill #2
  Filled 2024-03-17: qty 60, 30d supply, fill #3

## 2023-11-25 MED ORDER — LEUCOVORIN CALCIUM INJECTION 350 MG
400.0000 mg/m2 | Freq: Once | INTRAMUSCULAR | Status: AC
  Administered 2023-11-25: 788 mg via INTRAVENOUS
  Filled 2023-11-25: qty 25

## 2023-11-25 MED ORDER — IRINOTECAN HCL LIPOSOME CHEMO INJECTION 43 MG/10ML
50.0000 mg/m2 | INJECTION | Freq: Once | INTRAVENOUS | Status: AC
  Administered 2023-11-25: 98.9 mg via INTRAVENOUS
  Filled 2023-11-25: qty 23

## 2023-11-25 MED ORDER — DIPHENHYDRAMINE HCL 50 MG/ML IJ SOLN
25.0000 mg | Freq: Once | INTRAMUSCULAR | Status: AC
  Administered 2023-11-25: 25 mg via INTRAVENOUS
  Filled 2023-11-25: qty 1

## 2023-11-25 MED ORDER — DEXTROSE 5 % IV SOLN: INTRAVENOUS | Status: DC

## 2023-11-25 MED ORDER — SODIUM CHLORIDE 0.9% FLUSH
10.0000 mL | Freq: Once | INTRAVENOUS | Status: AC
Start: 2023-11-25 — End: 2023-11-25
  Administered 2023-11-25: 10 mL

## 2023-11-25 MED ORDER — SODIUM CHLORIDE 0.9 % IV SOLN
2000.0000 mg/m2 | INTRAVENOUS | Status: AC
Start: 2023-11-25 — End: 2023-11-27
  Administered 2023-11-25: 3950 mg via INTRAVENOUS
  Filled 2023-11-25: qty 79

## 2023-11-25 NOTE — Progress Notes (Signed)
[facility] genetics kit labs drawn today. Patient says she had them done already but agreed to let me collect them again.

## 2023-11-25 NOTE — Telephone Encounter (Signed)
Patient has declined to see palliative care at this time. Referral will be closed, palliative services available should patient decide they want to be seen.

## 2023-11-25 NOTE — Progress Notes (Signed)
[facility] CSW Progress Note  Visual merchandiser  met w/ pt to check in.  Pt reports she was able to get paperwork straightened out w/ HR and short term disability has been approved and received.  CSW emailed pt the link to [employer] to apply for a grant to assist w/ out of pocket medical expenses.  CSW to remain available to provide support as appropriate throughout duration of treatment.        Rachel Moulds, LCSW Clinical Social Worker [facility]

## 2023-11-25 NOTE — Patient Instructions (Signed)
[facility] - A DEPT OF [facility]  Discharge Instructions: Thank you for choosing [facility] to provide your oncology and hematology care.   If you have a lab appointment with the Cancer Center, please go directly to the Cancer Center and check in at the registration area.   Wear comfortable clothing and clothing appropriate for easy access to any Portacath or PICC line.   We strive to give you quality time with your provider. You may need to reschedule your appointment if you arrive late (15 or more minutes).  Arriving late affects you and other patients whose appointments are after yours.  Also, if you miss three or more appointments without notifying the office, you may be dismissed from the clinic at the provider's discretion.      For prescription refill requests, have your pharmacy contact our office and allow 72 hours for refills to be completed.    Today you received the following chemotherapy and/or immunotherapy agents: Liposomal Irinotecan, Oxaliplatin, Leucovorin, Fluorouracil.       To help prevent nausea and vomiting after your treatment, we encourage you to take your nausea medication as directed.  BELOW ARE SYMPTOMS THAT SHOULD BE REPORTED IMMEDIATELY: *FEVER GREATER THAN 100.4 F (38 C) OR HIGHER *CHILLS OR SWEATING *NAUSEA AND VOMITING THAT IS NOT CONTROLLED WITH YOUR NAUSEA MEDICATION *UNUSUAL SHORTNESS OF BREATH *UNUSUAL BRUISING OR BLEEDING *URINARY PROBLEMS (pain or burning when urinating, or frequent urination) *BOWEL PROBLEMS (unusual diarrhea, constipation, pain near the anus) TENDERNESS IN MOUTH AND THROAT WITH OR WITHOUT PRESENCE OF ULCERS (sore throat, sores in mouth, or a toothache) UNUSUAL RASH, SWELLING OR PAIN  UNUSUAL VAGINAL DISCHARGE OR ITCHING   Items with * indicate a potential emergency and should be followed up as soon as possible or go to the Emergency Department if any problems should  occur.  Please show the CHEMOTHERAPY ALERT CARD or IMMUNOTHERAPY ALERT CARD at check-in to the Emergency Department and triage nurse.  Should you have questions after your visit or need to cancel or reschedule your appointment, please contact [facility] - A DEPT OF [facility]  Dept: [phone]  and follow the prompts.  Office hours are 8:00 a.m. to 4:30 p.m. Monday - Friday. Please note that voicemails left after 4:00 p.m. may not be returned until the following business day.  We are closed weekends and major holidays. You have access to a nurse at all times for urgent questions. Please call the main number to the clinic Dept: [phone] and follow the prompts.   For any non-urgent questions, you may also contact your provider using MyChart. We now offer e-Visits for anyone 22 and older to request care online for non-urgent symptoms. For details visit [email or web address].   Also download the MyChart app! Go to the app store, search "MyChart", open the app, select [facility], and log in with your MyChart username and password.

## 2023-11-25 NOTE — Progress Notes (Unsigned)
Per Herbert Seta PA, ok to treat today with ANC 1.2 and elevated LFTs

## 2023-11-25 NOTE — Progress Notes (Signed)
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 Folfirinox discontinued after first cycle due to reaction. She is currently receiving Nalirifox q28d. Patient is under the care of Dr. Mosetta Putt.  Past medical history includes HTN, DM2, acute PE, vitamin D deficiency, h/o breast cancer  Met with patient in infusion. Patient in good spirits. She reports feeling good and since yesterday, patient eating without abdominal pain. She is excited about this and hoping to enjoy Thanksgiving foods. Patient reports eating [place] yogurt with blueberries, honeydew, peanut butter/graham crackers, Real Fruit bars (mango), baked potato with one pat of butter. She is drinking one Ensure Max (150 kcal, 30g). Patient likes the french vanilla and adds a small amount of coffee to this for a treat. Patient is working to increase water intake which is challenging at room temperature. Patient reports regular bowel movements. She denies nausea, vomiting.    Nutrition Focused Physical Exam: deferred     Medications: eliquis, cozaar, zofran, compazine   Labs: Na 131, K 3.4, glucose 121, AST 118, ALT 91, Alk Phos 337   Anthropometrics:   Height: 5'1.5" Weight: 175 lb 6.4 oz  UBW: 199 lb (7/24) BMI: 32.60   NUTRITION DIAGNOSIS: Unintended wt loss related to pancreatic cancer as evidenced by 12% decrease from usual weight in 4 months which is severe for time frame    INTERVENTION:  Educated on importance of adequate calorie and protein energy intake to preserve loss of LBM, maintain weight/strength Encouraged small frequent meals q2h - snack ideas provided Educated on sources of protein, recommend including protein foods at every meal - soft moist high protein foods list provided Offered warm supplement ideas - recipes provided Provided high calorie shake recipes  Tips for nausea/vomiting handout  provided  Continue drinking Ensure, suggested switching to Ensure Plus/equivalent for added calories 2/day - samples + coupons provided Contact information     MONITORING, EVALUATION, GOAL: Patient will tolerate increased calories and protein to minimize further weight loss during treatment    Next Visit: Wednesday December 11 during infusion

## 2023-11-25 NOTE — Progress Notes (Unsigned)
Per Mosetta Putt MD, ok to run Fluorouracil pump over 44 hours to accommodate holiday clinic hours.

## 2023-11-25 NOTE — Progress Notes (Unsigned)
Per Herbert Seta PA, run Liposomal Irinotecan infusion at 245ml/hr today to match previously tolerated rate on date of reaction.

## 2023-11-25 NOTE — Progress Notes (Signed)
Added benadryl 25 mg IV once prn and pepcid 20 mg iv to premeds due to hypersensitivity reaction with first day chemotherapy

## 2023-11-27 ENCOUNTER — Inpatient Hospital Stay: Payer: 59

## 2023-11-27 ENCOUNTER — Other Ambulatory Visit: Payer: Self-pay

## 2023-11-27 ENCOUNTER — Other Ambulatory Visit: Payer: Self-pay | Admitting: Nurse Practitioner

## 2023-11-27 VITALS — BP 113/84 | HR 85 | Resp 19

## 2023-11-27 DIAGNOSIS — C251 Malignant neoplasm of body of pancreas: Secondary | ICD-10-CM

## 2023-11-27 NOTE — Progress Notes (Signed)
Per Mosetta Putt, MD, holding Udenyca/Neulasta Onpro this cycle due to no authorization.

## 2023-11-30 ENCOUNTER — Encounter: Payer: Self-pay | Admitting: Genetic Counselor

## 2023-11-30 ENCOUNTER — Ambulatory Visit: Payer: 59

## 2023-11-30 ENCOUNTER — Other Ambulatory Visit: Payer: 59

## 2023-11-30 ENCOUNTER — Telehealth: Payer: Self-pay | Admitting: Genetic Counselor

## 2023-11-30 ENCOUNTER — Ambulatory Visit: Payer: 59 | Admitting: Nurse Practitioner

## 2023-11-30 ENCOUNTER — Ambulatory Visit: Payer: Self-pay | Admitting: Genetic Counselor

## 2023-11-30 DIAGNOSIS — Z1379 Encounter for other screening for genetic and chromosomal anomalies: Secondary | ICD-10-CM

## 2023-11-30 NOTE — Progress Notes (Signed)
HPI:  Ms. Jerez was called with her genetic test results. Ms. Biggerstaff recent genetic test results were disclosed to her, as were recommendations warranted by these results. These results and recommendations are discussed in more detail below.  CANCER HISTORY:  Oncology History  Malignant neoplasm of overlapping sites of left breast in female, estrogen receptor positive (HCC)  08/04/2018 Initial Diagnosis   Screening mammogram detected left breast mass at 9:30 position 1.2 x 1.2 x 1.1 cm.  Closer to the nipple at 9:30 position 0.8 cm lesion span 3.6 cm and a 1.5 cm apart.  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 position 2 cm from nipple, at 1 cm from nipple PASH, lymph node benign, ER 90%, PR 95%, Ki-67 20%, HER-2 negative by IHC 1+, T1CN0 stage Ia AJCC 8   09/29/2018 Surgery   Left lumpectomy: Grade 3 IDC, 1.7 cm, margins negative, lymphovascular invasion present, 0/2 lymph nodes negative, T1c N0 stage Ia   10/06/2018 Cancer Staging   Staging form: Breast, AJCC 8th Edition - Pathologic: Stage IA (pT1c, pN0(sn), cM0, G3, ER+, PR+, HER2-) - Signed by [name], MD on 10/06/2018   10/22/2018 Oncotype testing   Oncotype score 13: Distant recurrence at 9 years with hormone therapy alone 4%   11/12/2018 - 12/27/2018 Radiation Therapy   Adjuvant radiation therapy   12/27/2018 -  Anti-estrogen oral therapy   Antiestrogen therapy with letrozole 2.5 mg daily   11/30/2023 Genetic Testing   Negative genetic testing on the CancerNext-Expanded+RNAinsight panel.  VUS identified in KIT c.2083G>C and PMS2 c.-1C>A.  The report date is 11/30/2023.  The CancerNext-Expanded gene panel offered by [facility] and includes sequencing, rearrangement, and RNA analysis for the following 76 genes: AIP, ALK, APC, ATM, AXIN2, BAP1, BARD1, BMPR1A, BRCA1, BRCA2, BRIP1, CDC73, CDH1, CDK4, CDKN1B, CDKN2A, CEBPA, CHEK2, CTNNA1, DDX41, DICER1,  ETV6, FH, FLCN, GATA2, LZTR1, MAX, MBD4, MEN1, MET, MLH1, MSH2, MSH3, MSH6, MUTYH, NF1, NF2, NTHL1, PALB2, PHOX2B, PMS2, POT1, PRKAR1A, PTCH1, PTEN, RAD51C, RAD51D, RB1, RET, RUNX1, SDHA, SDHAF2, SDHB, SDHC, SDHD, SMAD4, SMARCA4, SMARCB1, SMARCE1, STK11, SUFU, TMEM127, TP53, TSC1, TSC2, VHL, and WT1 (sequencing and deletion/duplication); EGFR, HOXB13, KIT, MITF, PDGFRA, POLD1, and POLE (sequencing only); EPCAM and GREM1 (deletion/duplication only).    Pancreatic cancer (HCC)  11/03/2023 Initial Diagnosis   Pancreatic cancer (HCC)   11/11/2023 -  Chemotherapy   Patient is on Treatment Plan : PANCREAS NALIRIFOX D1, 15 Q28D     11/30/2023 Genetic Testing   Negative genetic testing on the CancerNext-Expanded+RNAinsight panel.  VUS identified in KIT c.2083G>C and PMS2 c.-1C>A.  The report date is 11/30/2023.  The CancerNext-Expanded gene panel offered by [facility] and includes sequencing, rearrangement, and RNA analysis for the following 76 genes: AIP, ALK, APC, ATM, AXIN2, BAP1, BARD1, BMPR1A, BRCA1, BRCA2, BRIP1, CDC73, CDH1, CDK4, CDKN1B, CDKN2A, CEBPA, CHEK2, CTNNA1, DDX41, DICER1, ETV6, FH, FLCN, GATA2, LZTR1, MAX, MBD4, MEN1, MET, MLH1, MSH2, MSH3, MSH6, MUTYH, NF1, NF2, NTHL1, PALB2, PHOX2B, PMS2, POT1, PRKAR1A, PTCH1, PTEN, RAD51C, RAD51D, RB1, RET, RUNX1, SDHA, SDHAF2, SDHB, SDHC, SDHD, SMAD4, SMARCA4, SMARCB1, SMARCE1, STK11, SUFU, TMEM127, TP53, TSC1, TSC2, VHL, and WT1 (sequencing and deletion/duplication); EGFR, HOXB13, KIT, MITF, PDGFRA, POLD1, and POLE (sequencing only); EPCAM and GREM1 (deletion/duplication only).      FAMILY HISTORY:  We obtained a detailed, 4-generation family history.  Significant diagnoses are listed below: Family History  Problem Relation Age of Onset  Breast cancer Mother 61   Colon polyps Mother    Colon cancer Neg Hx    Rectal cancer Neg Hx    Stomach cancer Neg Hx    Esophageal cancer Neg Hx     GENETIC TEST RESULTS: Genetic testing reported out on  November 30, 2023 through the CancerNext-Expanded+RNAinsight cancer panel found no pathogenic mutations. The CancerNext-Expanded gene panel offered by [facility] and includes sequencing, rearrangement, and RNA analysis for the following 76 genes: AIP, ALK, APC, ATM, AXIN2, BAP1, BARD1, BMPR1A, BRCA1, BRCA2, BRIP1, CDC73, CDH1, CDK4, CDKN1B, CDKN2A, CEBPA, CHEK2, CTNNA1, DDX41, DICER1, ETV6, FH, FLCN, GATA2, LZTR1, MAX, MBD4, MEN1, MET, MLH1, MSH2, MSH3, MSH6, MUTYH, NF1, NF2, NTHL1, PALB2, PHOX2B, PMS2, POT1, PRKAR1A, PTCH1, PTEN, RAD51C, RAD51D, RB1, RET, RUNX1, SDHA, SDHAF2, SDHB, SDHC, SDHD, SMAD4, SMARCA4, SMARCB1, SMARCE1, STK11, SUFU, TMEM127, TP53, TSC1, TSC2, VHL, and WT1 (sequencing and deletion/duplication); EGFR, HOXB13, KIT, MITF, PDGFRA, POLD1, and POLE (sequencing only); EPCAM and GREM1 (deletion/duplication only). The test report has been scanned into EPIC and is located under the Molecular Pathology section of the Results Review tab.  A portion of the result report is included below for reference.     We discussed with Ms. Fratto that because current genetic testing is not perfect, it is possible there may be a gene mutation in one of these genes that current testing cannot detect, but that chance is small.  We also discussed, that there could be another gene that has not yet been discovered, or that we have not yet tested, that is responsible for the cancer diagnoses in the family. It is also possible there is a hereditary cause for the cancer in the family that Ms. Ehrhard did not inherit and therefore was not identified in her testing.  Therefore, it is important to remain in touch with cancer genetics in the future so that we can continue to offer Ms. Marotti the most up to date genetic testing.   Genetic testing did identify two Variants of uncertain significance (VUS) - one in the KIT gene called c.2083G>C, and a second in the PMS2 gene called c.-1C>A.  At this time, it is unknown if  these variants are associated with increased cancer risk or if they are normal findings, but most variants such as these get reclassified to being inconsequential. They should not be used to make medical management decisions. With time, we suspect the lab will determine the significance of these variants, if any. If we do learn more about them, we will try to contact Ms. Mcever to discuss it further. However, it is important to stay in touch with [place] periodically and keep the address and phone number up to date.  CANCER SCREENING RECOMMENDATIONS: Ms. Ippoliti test result is considered negative (normal).  This means that we have not identified a hereditary cause for her personal and family history of cancer at this time. Most cancers happen by chance and this negative test suggests that her personal and family history of cancer may fall into this category.    Possible reasons for Ms. Northup's negative genetic test include:  1. There may be a gene mutation in one of these genes that current testing methods cannot detect but that chance is small.  2. There could be another gene that has not yet been discovered, or that we have not yet tested, that is responsible for the cancer diagnoses in the family.  3.  There may be no hereditary risk for cancer in the family. The  cancers in Ms. Daniel and/or her family may be sporadic/familial or due to other genetic and environmental factors. 4. It is also possible there is a hereditary cause for the cancer in the family that Ms. Learned did not inherit.  Therefore, it is recommended she continue to follow the cancer management and screening guidelines provided by her oncology and primary healthcare provider. An individual's cancer risk and medical management are not determined by genetic test results alone. Overall cancer risk assessment incorporates additional factors, including personal medical history, family history, and any available genetic information that may  result in a personalized plan for cancer prevention and surveillance  RECOMMENDATIONS FOR FAMILY MEMBERS:  Individuals in this family might be at some increased risk of developing cancer, over the general population risk, simply due to the family history of cancer.  We recommended women in this family have a yearly mammogram beginning at age 98, or 78 years younger than the earliest onset of cancer, an annual clinical breast exam, and perform monthly breast self-exams. Women in this family should also have a gynecological exam as recommended by their primary provider. All family members should be referred for colonoscopy starting at age 71, or 70 years younger than the earliest onset of cancer.  Ms. Ortman questions were answered to her satisfaction, and she knows she is welcome to call us at anytime with additional questions or concerns.   Maylon Cos, MS, [facility] Licensed, Certified Genetic Counselor [email or web address]

## 2023-11-30 NOTE — Telephone Encounter (Signed)
Revealed negative genetic testing.  Discussed that we do not know why she has pancreatic cancer or why there is cancer in the family. It could be due to a different gene that we are not testing, or maybe our current technology may not be able to pick something up.  It will be important for her to keep in contact with genetics to keep up with whether additional testing may be needed.  

## 2023-12-04 ENCOUNTER — Ambulatory Visit: Admitting: Hematology and Oncology

## 2023-12-06 ENCOUNTER — Other Ambulatory Visit: Payer: Self-pay | Admitting: Hematology

## 2023-12-06 DIAGNOSIS — C251 Malignant neoplasm of body of pancreas: Secondary | ICD-10-CM

## 2023-12-08 NOTE — Assessment & Plan Note (Signed)
-  stage IV with liver mets -diagnosed in 10/2023 -Patient has a history of breast cancer. She presents with dyspnea, chest discomfort, decreased appetite, and weight loss. She also reports new onset back pain. -CT showed a 2.9cm mass in pancreatic neck/body, and multiple liver mets, liver biopsy confirmed adenocarcinoma  -I reviewed the aggressive nature of pancreatic cancer, and the incurable nature of her disease due to diffuse liver metastasis. -she start first line chemo Nalirifox chemotherapy regimen on 11/12, with dose reduction for first cycle due to poor PS -Perform molecular testing on tumor tissue to identify potential targeted therapy options. -She underwent genetic testing and came back negative

## 2023-12-09 ENCOUNTER — Encounter: Payer: Self-pay | Admitting: Hematology

## 2023-12-09 ENCOUNTER — Other Ambulatory Visit: Payer: Self-pay

## 2023-12-09 ENCOUNTER — Inpatient Hospital Stay: Admitting: Dietician

## 2023-12-09 ENCOUNTER — Inpatient Hospital Stay (HOSPITAL_BASED_OUTPATIENT_CLINIC_OR_DEPARTMENT_OTHER): Admitting: Hematology

## 2023-12-09 ENCOUNTER — Inpatient Hospital Stay: Attending: Hematology

## 2023-12-09 ENCOUNTER — Inpatient Hospital Stay

## 2023-12-09 VITALS — BP 98/70 | HR 90 | Temp 97.7°F | Resp 16 | Wt 177.5 lb

## 2023-12-09 DIAGNOSIS — C251 Malignant neoplasm of body of pancreas: Secondary | ICD-10-CM | POA: Diagnosis not present

## 2023-12-09 DIAGNOSIS — T80212A Local infection due to central venous catheter, initial encounter: Secondary | ICD-10-CM | POA: Diagnosis not present

## 2023-12-09 DIAGNOSIS — Z95828 Presence of other vascular implants and grafts: Secondary | ICD-10-CM

## 2023-12-09 DIAGNOSIS — Z5189 Encounter for other specified aftercare: Secondary | ICD-10-CM | POA: Insufficient documentation

## 2023-12-09 DIAGNOSIS — T82898A Other specified complication of vascular prosthetic devices, implants and grafts, initial encounter: Secondary | ICD-10-CM | POA: Diagnosis not present

## 2023-12-09 DIAGNOSIS — C787 Secondary malignant neoplasm of liver and intrahepatic bile duct: Secondary | ICD-10-CM | POA: Insufficient documentation

## 2023-12-09 DIAGNOSIS — T80219A Unspecified infection due to central venous catheter, initial encounter: Secondary | ICD-10-CM | POA: Diagnosis not present

## 2023-12-09 DIAGNOSIS — Z5111 Encounter for antineoplastic chemotherapy: Secondary | ICD-10-CM | POA: Insufficient documentation

## 2023-12-09 DIAGNOSIS — Z0389 Encounter for observation for other suspected diseases and conditions ruled out: Secondary | ICD-10-CM | POA: Diagnosis not present

## 2023-12-09 DIAGNOSIS — C259 Malignant neoplasm of pancreas, unspecified: Secondary | ICD-10-CM | POA: Diagnosis not present

## 2023-12-09 DIAGNOSIS — T80211A Bloodstream infection due to central venous catheter, initial encounter: Secondary | ICD-10-CM | POA: Diagnosis not present

## 2023-12-09 LAB — CMP (CANCER CENTER ONLY)
ALT: 42 U/L (ref 0–44)
AST: 45 U/L — ABNORMAL HIGH (ref 15–41)
Albumin: 3.5 g/dL (ref 3.5–5.0)
Alkaline Phosphatase: 253 U/L — ABNORMAL HIGH (ref 38–126)
Anion gap: 10 (ref 5–15)
BUN: 7 mg/dL — ABNORMAL LOW (ref 8–23)
CO2: 23 mmol/L (ref 22–32)
Calcium: 9 mg/dL (ref 8.9–10.3)
Chloride: 103 mmol/L (ref 98–111)
Creatinine: 0.52 mg/dL (ref 0.44–1.00)
GFR, Estimated: >60 mL/min (ref >60)
Glucose, Bld: 180 mg/dL — ABNORMAL HIGH (ref 70–99)
Potassium: 3.2 mmol/L — ABNORMAL LOW (ref 3.5–5.1)
Sodium: 136 mmol/L (ref 135–145)
Total Bilirubin: 0.4 mg/dL (ref <1.2)
Total Protein: 7.2 g/dL (ref 6.5–8.1)

## 2023-12-09 LAB — CBC WITH DIFFERENTIAL (CANCER CENTER ONLY)
Abs Immature Granulocytes: 0 10*3/uL (ref 0.00–0.07)
Basophils Absolute: 0 10*3/uL (ref 0.0–0.1)
Basophils Relative: 1 %
Eosinophils Absolute: 0.2 10*3/uL (ref 0.0–0.5)
Eosinophils Relative: 6 %
HCT: 36.4 % (ref 36.0–46.0)
Hemoglobin: 11.9 g/dL — ABNORMAL LOW (ref 12.0–15.0)
Immature Granulocytes: 0 %
Lymphocytes Relative: 42 %
Lymphs Abs: 1.4 10*3/uL (ref 0.7–4.0)
MCH: 25.1 pg — ABNORMAL LOW (ref 26.0–34.0)
MCHC: 32.7 g/dL (ref 30.0–36.0)
MCV: 76.6 fL — ABNORMAL LOW (ref 80.0–100.0)
Monocytes Absolute: 0.5 10*3/uL (ref 0.1–1.0)
Monocytes Relative: 16 %
Neutro Abs: 1.1 10*3/uL — ABNORMAL LOW (ref 1.7–7.7)
Neutrophils Relative %: 35 %
Platelet Count: 260 10*3/uL (ref 150–400)
RBC: 4.75 MIL/uL (ref 3.87–5.11)
RDW: 16.2 % — ABNORMAL HIGH (ref 11.5–15.5)
WBC Count: 3.2 10*3/uL — ABNORMAL LOW (ref 4.0–10.5)
nRBC: 0 % (ref 0.0–0.2)

## 2023-12-09 MED ORDER — PALONOSETRON HCL INJECTION 0.25 MG/5ML
0.2500 mg | Freq: Once | INTRAVENOUS | Status: AC
  Administered 2023-12-09: 0.25 mg via INTRAVENOUS
  Filled 2023-12-09: qty 5

## 2023-12-09 MED ORDER — LEUCOVORIN CALCIUM INJECTION 350 MG
400.0000 mg/m2 | Freq: Once | INTRAVENOUS | Status: AC
  Administered 2023-12-09: 788 mg via INTRAVENOUS
  Filled 2023-12-09: qty 39.4

## 2023-12-09 MED ORDER — IRINOTECAN HCL LIPOSOME CHEMO INJECTION 43 MG/10ML
50.0000 mg/m2 | INJECTION | Freq: Once | INTRAVENOUS | Status: AC
  Administered 2023-12-09: 98.9 mg via INTRAVENOUS
  Filled 2023-12-09: qty 23

## 2023-12-09 MED ORDER — OXALIPLATIN CHEMO INJECTION 100 MG/20ML
60.0000 mg/m2 | Freq: Once | INTRAVENOUS | Status: AC
  Administered 2023-12-09: 120 mg via INTRAVENOUS
  Filled 2023-12-09: qty 4

## 2023-12-09 MED ORDER — ATROPINE SULFATE 1 MG/ML IV SOLN
0.4000 mg | Freq: Once | INTRAVENOUS | Status: DC
  Filled 2023-12-09: qty 1

## 2023-12-09 MED ORDER — SODIUM CHLORIDE 0.9 % IV SOLN: INTRAVENOUS | Status: DC

## 2023-12-09 MED ORDER — FAMOTIDINE IN NACL 20-0.9 MG/50ML-% IV SOLN
20.0000 mg | Freq: Once | INTRAVENOUS | Status: AC
Start: 2023-12-09 — End: 2023-12-09
  Administered 2023-12-09: 20 mg via INTRAVENOUS
  Filled 2023-12-09: qty 50

## 2023-12-09 MED ORDER — SODIUM CHLORIDE 0.9 % IV SOLN
2000.0000 mg/m2 | INTRAVENOUS | Status: DC
  Administered 2023-12-09: 3950 mg via INTRAVENOUS
  Filled 2023-12-09: qty 79

## 2023-12-09 MED ORDER — DEXAMETHASONE SODIUM PHOSPHATE 10 MG/ML IJ SOLN
10.0000 mg | Freq: Once | INTRAMUSCULAR | Status: AC
  Administered 2023-12-09: 10 mg via INTRAVENOUS
  Filled 2023-12-09: qty 1

## 2023-12-09 MED ORDER — SODIUM CHLORIDE 0.9% FLUSH
10.0000 mL | INTRAVENOUS | Status: DC | PRN
  Administered 2023-12-09: 10 mL

## 2023-12-09 MED ORDER — HEPARIN SOD (PORK) LOCK FLUSH 100 UNIT/ML IV SOLN
500.0000 [IU] | Freq: Once | INTRAVENOUS | Status: DC | PRN
Start: 2023-12-09 — End: 2023-12-09

## 2023-12-09 MED ORDER — DEXTROSE 5 % IV SOLN
INTRAVENOUS | Status: DC
Start: 2023-12-09 — End: 2023-12-09

## 2023-12-09 MED ORDER — DIPHENHYDRAMINE HCL 50 MG/ML IJ SOLN
25.0000 mg | Freq: Once | INTRAMUSCULAR | Status: AC | PRN
Start: 2023-12-09 — End: 2023-12-09
  Administered 2023-12-09: 25 mg via INTRAVENOUS
  Filled 2023-12-09: qty 1

## 2023-12-09 MED ORDER — SODIUM CHLORIDE 0.9% FLUSH
10.0000 mL | Freq: Once | INTRAVENOUS | Status: AC
  Administered 2023-12-09: 10 mL

## 2023-12-09 MED ORDER — ATROPINE SULFATE 1 MG/ML IV SOLN
0.5000 mg | Freq: Once | INTRAVENOUS | Status: AC | PRN
  Administered 2023-12-09: 0.5 mg via INTRAVENOUS

## 2023-12-09 NOTE — Progress Notes (Signed)
Per Mosetta Putt MD ok to treat with ANC 1.1

## 2023-12-09 NOTE — Progress Notes (Unsigned)
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 Folfirinox discontinued after first cycle due to reaction. She is currently receiving Nalirifox q28d.   Met with patient in infusion. She reports doing well today. Patient has decreased appetite, however utilizing smaller more frequent meals to support wt stability. Patient is tolerating a variety of foods. Recalls english muffin with egg for breakfast. She is drinking 2 Ensure Plus/equivalent. Patient denies nausea, vomiting, diarrhea, constipation.    Medications: reviewed  Labs: K 3.2, BUN 7, glucose 180  Anthropometrics: Wt 177 lb 8 oz today - increased   11/27 - 175 lb 6.4 oz    NUTRITION DIAGNOSIS: Unintended wt loss related to pancreatic cancer - stable  INTERVENTION:  Continue strategies for increasing calories and protein with small frequent meals/snacks Continue 2 ensure plus/equivalent    MONITORING, EVALUATION, GOAL: wt trends, intake   NEXT VISIT: Wednesday January 8 during infusion

## 2023-12-09 NOTE — Patient Instructions (Signed)
[facility] - A DEPT OF [facility]  Discharge Instructions: Thank you for choosing [facility] to provide your oncology and hematology care.   If you have a lab appointment with the Cancer Center, please go directly to the Cancer Center and check in at the registration area.   Wear comfortable clothing and clothing appropriate for easy access to any Portacath or PICC line.   We strive to give you quality time with your provider. You may need to reschedule your appointment if you arrive late (15 or more minutes).  Arriving late affects you and other patients whose appointments are after yours.  Also, if you miss three or more appointments without notifying the office, you may be dismissed from the clinic at the provider's discretion.      For prescription refill requests, have your pharmacy contact our office and allow 72 hours for refills to be completed.    Today you received the following chemotherapy and/or immunotherapy agents: Liposomal Irinotecan, Oxaliplatin, Leucovorin, Fluorouracil.       To help prevent nausea and vomiting after your treatment, we encourage you to take your nausea medication as directed.  BELOW ARE SYMPTOMS THAT SHOULD BE REPORTED IMMEDIATELY: *FEVER GREATER THAN 100.4 F (38 C) OR HIGHER *CHILLS OR SWEATING *NAUSEA AND VOMITING THAT IS NOT CONTROLLED WITH YOUR NAUSEA MEDICATION *UNUSUAL SHORTNESS OF BREATH *UNUSUAL BRUISING OR BLEEDING *URINARY PROBLEMS (pain or burning when urinating, or frequent urination) *BOWEL PROBLEMS (unusual diarrhea, constipation, pain near the anus) TENDERNESS IN MOUTH AND THROAT WITH OR WITHOUT PRESENCE OF ULCERS (sore throat, sores in mouth, or a toothache) UNUSUAL RASH, SWELLING OR PAIN  UNUSUAL VAGINAL DISCHARGE OR ITCHING   Items with * indicate a potential emergency and should be followed up as soon as possible or go to the Emergency Department if any problems should  occur.  Please show the CHEMOTHERAPY ALERT CARD or IMMUNOTHERAPY ALERT CARD at check-in to the Emergency Department and triage nurse.  Should you have questions after your visit or need to cancel or reschedule your appointment, please contact [facility] - A DEPT OF [facility]  Dept: [phone]  and follow the prompts.  Office hours are 8:00 a.m. to 4:30 p.m. Monday - Friday. Please note that voicemails left after 4:00 p.m. may not be returned until the following business day.  We are closed weekends and major holidays. You have access to a nurse at all times for urgent questions. Please call the main number to the clinic Dept: [phone] and follow the prompts.   For any non-urgent questions, you may also contact your provider using MyChart. We now offer e-Visits for anyone 1 and older to request care online for non-urgent symptoms. For details visit [email or web address].   Also download the MyChart app! Go to the app store, search "MyChart", open the app, select [place], and log in with your MyChart username and password.

## 2023-12-09 NOTE — Progress Notes (Signed)
[facility]   [phone] [phone]   Clinic Follow up Note   Patient Care Team: [name], PA as PCP - General (Nurse Practitioner) [name], MD as Consulting Physician (Hematology and Oncology) [name], MD as Consulting Physician (Radiation Oncology) [name], MD as Consulting Physician (General Surgery) [name], MD as Consulting Physician (Hematology and Oncology)  Date of Service:  12/09/2023  CHIEF COMPLAINT: f/u of pancreatic cancer  CURRENT THERAPY:  First-line chemotherapy NALIRIFOX  Oncology History   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 She presents with dyspnea, chest discomfort, decreased appetite, and weight loss. She also reports new onset back pain. -CT showed a 2.9cm mass in pancreatic neck/body, and multiple liver mets, liver biopsy confirmed adenocarcinoma  -I reviewed the aggressive nature of pancreatic cancer, and the incurable nature of her disease due to diffuse liver metastasis. -she start first line chemo FOLFIRINOX chemotherapy regimen on 11/12, with dose reduction for first cycle due to poor PS -Perform molecular testing on tumor tissue to identify potential targeted therapy options. -She underwent genetic testing and came back negative     Assessment and Plan    Metastatic Pancreatic Cancer Follow-up for metastatic pancreatic cancer. Gained 2-3 pounds, which is positive. Blood pressure slightly low but asymptomatic. Port site slightly red but functional without pain. No issues with the last chemotherapy cycle. White blood cell count slightly low due to chemotherapy, consistent with previous cycles. Kidney and liver function tests pending. Steroid premedication caused hyperactivity, managed with Benadryl. Plan to continue chemotherapy with a scan after cycle five or six to assess progress. Next chemotherapy session  scheduled for December 26. 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 Managed with Benadryl. Discussed reducing steroid dosage but patient prefers current regimen. - Consider Benadryl for steroid-induced hyperactivity  General Health Maintenance Maintaining weight and nutritional intake with focus on carbohydrates, protein, and sugar. Blood pressure slightly low but asymptomatic. Encouraged hydration and balanced diet. - Encourage hydration and balanced diet - Monitor blood pressure regularly  Plan -Lab reviewed, adequate for treatment, will proceed to cycle 2 chemotherapy today at same dose -due to her improving PS, will change 5-fu to full dose 2400mg /m2 on next cycle  -f/u in 2 weeks      SUMMARY OF ONCOLOGIC HISTORY: Oncology History  Malignant neoplasm of overlapping sites of left breast in female, estrogen receptor positive (HCC)  08/04/2018 Initial Diagnosis   Screening mammogram detected left breast mass at 9:30 position 1.2 x 1.2 x 1.1 cm.  Closer to the nipple at 9:30 position 0.8 cm lesion span 3.6 cm and a 1.5 cm apart.  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 position 2 cm from nipple, at 1 cm from nipple PASH, lymph node benign, ER 90%, PR 95%, Ki-67 20%, HER-2 negative by IHC 1+, T1CN0 stage Ia AJCC 8   09/29/2018 Surgery   Left lumpectomy: Grade 3 IDC, 1.7 cm, margins negative, lymphovascular invasion present, 0/2 lymph nodes  negative, T1c N0 stage Ia   10/06/2018 Cancer Staging    Staging form: Breast, AJCC 8th Edition - Pathologic: Stage IA (pT1c, pN0(sn), cM0, G3, ER+, PR+, HER2-) - Signed by [name], MD on 10/06/2018   10/22/2018 Oncotype testing   Oncotype score 13: Distant recurrence at 9 years with hormone therapy alone 4%   11/12/2018 - 12/27/2018 Radiation Therapy   Adjuvant radiation therapy   12/27/2018 -  Anti-estrogen oral therapy   Antiestrogen therapy with letrozole 2.5 mg daily   11/30/2023 Genetic Testing   Negative genetic testing on the CancerNext-Expanded+RNAinsight panel.  VUS identified in KIT c.2083G>C and PMS2 c.-1C>A.  The report date is 11/30/2023.  The CancerNext-Expanded gene panel offered by [facility] and includes sequencing, rearrangement, and RNA analysis for the following 76 genes: AIP, ALK, APC, ATM, AXIN2, BAP1, BARD1, BMPR1A, BRCA1, BRCA2, BRIP1, CDC73, CDH1, CDK4, CDKN1B, CDKN2A, CEBPA, CHEK2, CTNNA1, DDX41, DICER1, ETV6, FH, FLCN, GATA2, LZTR1, MAX, MBD4, MEN1, MET, MLH1, MSH2, MSH3, MSH6, MUTYH, NF1, NF2, NTHL1, PALB2, PHOX2B, PMS2, POT1, PRKAR1A, PTCH1, PTEN, RAD51C, RAD51D, RB1, RET, RUNX1, SDHA, SDHAF2, SDHB, SDHC, SDHD, SMAD4, SMARCA4, SMARCB1, SMARCE1, STK11, SUFU, TMEM127, TP53, TSC1, TSC2, VHL, and WT1 (sequencing and deletion/duplication); EGFR, HOXB13, KIT, MITF, PDGFRA, POLD1, and POLE (sequencing only); EPCAM and GREM1 (deletion/duplication only).    Pancreatic cancer (HCC)  11/03/2023 Initial Diagnosis   Pancreatic cancer (HCC)   11/11/2023 -  Chemotherapy   Patient is on Treatment Plan : PANCREAS NALIRIFOX D1, 15 Q28D     11/30/2023 Genetic Testing   Negative genetic testing on the CancerNext-Expanded+RNAinsight panel.  VUS identified in KIT c.2083G>C and PMS2 c.-1C>A.  The report date is 11/30/2023.  The CancerNext-Expanded gene panel offered by [facility] and includes sequencing, rearrangement, and RNA analysis for the following 76 genes: AIP, ALK, APC, ATM, AXIN2, BAP1, BARD1, BMPR1A, BRCA1, BRCA2, BRIP1,  CDC73, CDH1, CDK4, CDKN1B, CDKN2A, CEBPA, CHEK2, CTNNA1, DDX41, DICER1, ETV6, FH, FLCN, GATA2, LZTR1, MAX, MBD4, MEN1, MET, MLH1, MSH2, MSH3, MSH6, MUTYH, NF1, NF2, NTHL1, PALB2, PHOX2B, PMS2, POT1, PRKAR1A, PTCH1, PTEN, RAD51C, RAD51D, RB1, RET, RUNX1, SDHA, SDHAF2, SDHB, SDHC, SDHD, SMAD4, SMARCA4, SMARCB1, SMARCE1, STK11, SUFU, TMEM127, TP53, TSC1, TSC2, VHL, and WT1 (sequencing and deletion/duplication); EGFR, HOXB13, KIT, MITF, PDGFRA, POLD1, and POLE (sequencing only); EPCAM and GREM1 (deletion/duplication only).       Discussed the use of AI scribe software for clinical note transcription with the patient, who gave verbal consent to proceed.  History of Present Illness   Ms. Lamphier, a patient with a history of metastatic pancreatic cancer, presents for a follow-up visit after her last cycle of chemotherapy. She reports a redness at the site of her port, but no associated pain or difficulty with flushing the port. She has gained weight, which she attributes to an increased intake of carbohydrates, protein, and sugar. She denies any dizziness despite a slightly low blood pressure reading. She has been maintaining hydration with water, coffee, and Ensure.  She reports no problems with her last cycle of chemotherapy, although she experienced a period of hyperactivity due to the steroids in her premedication regimen. This hyperactivity resolved after two days with the help of Benadryl. She also mentions overuse of her hand, which she plans to rest and protect more in the future.         All other systems were reviewed with the patient and are negative.  MEDICAL HISTORY:  Past Medical History:  Diagnosis Date   Allergy    Back pain  Cancer [facility]) 2019   left breast cancer-last radiation Dec.2019   Diabetes mellitus without complication (HCC)    Medical history non-contributory    Personal history of radiation therapy    Prediabetes    Tiredness    Vitamin D deficiency     SURGICAL  HISTORY: Past Surgical History:  Procedure Laterality Date   BREAST BIOPSY Left 08/04/2018   x3   BREAST LUMPECTOMY Left    09-29-18   BREAST LUMPECTOMY WITH RADIOACTIVE SEED AND SENTINEL LYMPH NODE BIOPSY Left 09/29/2018   Procedure: LEFT BREAST LUMPECTOMY WITH RADIOACTIVE SEED AND SENTINEL LYMPH NODE BIOPSY;  Surgeon: [name], MD;  Location: [facility];  Service: General;  Laterality: Left;   COLONOSCOPY  2016   IR IMAGING GUIDED PORT INSERTION  11/03/2023   POLYPECTOMY     WISDOM TOOTH EXTRACTION      I have reviewed the social history and family history with the patient and they are unchanged from previous note.  ALLERGIES:  is allergic to irinotecan liposome.  MEDICATIONS:  Current Outpatient Medications  Medication Sig Dispense Refill   apixaban (ELIQUIS) 5 MG TABS tablet Take 1 tablet (5 mg total) by mouth 2 (two) times daily. 60 tablet 3   cetirizine (ZYRTEC) 10 MG chewable tablet Chew 10 mg by mouth daily.     lidocaine-prilocaine (EMLA) cream Apply to affected area once 30 g 3   losartan (COZAAR) 25 MG tablet Take 1 tablet (25 mg total) by mouth daily for kidney protection. 90 tablet 3   ondansetron (ZOFRAN) 8 MG tablet Take 1 tablet (8 mg total) by mouth every 8 (eight) hours as needed for nausea or vomiting. Start on the third day after irinotecan 30 tablet 1   prochlorperazine (COMPAZINE) 10 MG tablet Take 1 tablet (10 mg total) by mouth every 6 (six) hours as needed for nausea or vomiting. 30 tablet 1   No current facility-administered medications for this visit.   Facility-Administered Medications Ordered in Other Visits  Medication Dose Route Frequency Provider Last Rate Last Admin   0.9 %  sodium chloride infusion   Intravenous Continuous [name], MD   Stopped at 12/09/23 1336   atropine injection 0.4 mg  0.4 mg Intravenous Once [name], MD       dextrose 5 % solution   Intravenous Continuous [name], MD 10 mL/hr at 11/25/23 1412 New Bag  at 11/25/23 1412   dextrose 5 % solution   Intravenous Continuous [name], MD   Stopped at 12/09/23 1614   fluorouracil (ADRUCIL) 3,950 mg in sodium chloride 0.9 % 71 mL chemo infusion  2,000 mg/m2 (Treatment Plan Recorded) Intravenous 1 day or 1 dose [name], MD   Infusion Verify at 12/09/23 1611   heparin lock flush 100 unit/mL  500 Units Intracatheter Once PRN [name], MD       heparin lock flush 100 unit/mL  500 Units Intracatheter Once PRN [name], MD       sodium chloride flush (NS) 0.9 % injection 10 mL  10 mL Intracatheter PRN [name], MD       sodium chloride flush (NS) 0.9 % injection 10 mL  10 mL Intracatheter PRN [name], MD   10 mL at 12/09/23 1559    PHYSICAL EXAMINATION: ECOG PERFORMANCE STATUS: 1 - Symptomatic but completely ambulatory  Vitals:   12/09/23 0948  BP: 98/70  Pulse: 90  Resp: 16  Temp: 97.7 F (36.5 C)  SpO2: 100%  Wt Readings from Last 3 Encounters:  12/09/23 177 lb 8 oz (80.5 kg)  11/25/23 175 lb 6.4 oz (79.6 kg)  11/11/23 184 lb 8 oz (83.7 kg)     GENERAL:alert, no distress and comfortable SKIN: skin color, texture, turgor are normal, no rashes or significant lesions EYES: normal, Conjunctiva are pink and non-injected, sclera clear NECK: supple, thyroid normal size, non-tender, without nodularity LYMPH:  no palpable lymphadenopathy in the cervical, axillary  LUNGS: clear to auscultation and percussion with normal breathing effort HEART: regular rate & rhythm and no murmurs and no lower extremity edema ABDOMEN:abdomen soft, non-tender and normal bowel sounds Musculoskeletal:no cyanosis of digits and no clubbing  NEURO: alert & oriented x 3 with fluent speech, no focal motor/sensory deficits  LABORATORY DATA:  I have reviewed the data as listed    Latest Ref Rng & Units 12/09/2023    9:05 AM 11/25/2023    8:33 AM 11/10/2023    8:19 AM  CBC  WBC 4.0 - 10.5 K/uL 3.2  3.4  12.0   Hemoglobin 12.0 - 15.0 g/dL 82.9  56.2  13.0    Hematocrit 36.0 - 46.0 % 36.4  38.0  40.9   Platelets 150 - 400 K/uL 260  421  336         Latest Ref Rng & Units 12/09/2023    9:05 AM 11/25/2023    8:33 AM 11/10/2023    8:19 AM  CMP  Glucose 70 - 99 mg/dL 865  784  696   BUN 8 - 23 mg/dL 7  10  9    Creatinine 0.44 - 1.00 mg/dL 2.95  2.84  1.32   Sodium 135 - 145 mmol/L 136  131  131   Potassium 3.5 - 5.1 mmol/L 3.2  3.4  3.4   Chloride 98 - 111 mmol/L 103  92  92   CO2 22 - 32 mmol/L 23  30  29    Calcium 8.9 - 10.3 mg/dL 9.0  9.4  9.2   Total Protein 6.5 - 8.1 g/dL 7.2  7.0  7.5   Total Bilirubin <1.2 mg/dL 0.4  0.6  0.9   Alkaline Phos 38 - 126 U/L 253  337  265   AST 15 - 41 U/L 45  118  37   ALT 0 - 44 U/L 42  91  21       RADIOGRAPHIC STUDIES: I have personally reviewed the radiological images as listed and agreed with the findings in the report. No results found.    No orders of the defined types were placed in this encounter.  All questions were answered. The patient knows to call the clinic with any problems, questions or concerns. No barriers to learning was detected. The total time spent in the appointment was 25 minutes.     [name], MD 12/09/2023

## 2023-12-10 ENCOUNTER — Other Ambulatory Visit: Payer: Self-pay

## 2023-12-10 ENCOUNTER — Encounter: Payer: Self-pay | Admitting: Hematology

## 2023-12-11 ENCOUNTER — Emergency Department (HOSPITAL_COMMUNITY): Payer: 59

## 2023-12-11 ENCOUNTER — Encounter (HOSPITAL_COMMUNITY): Payer: Self-pay

## 2023-12-11 ENCOUNTER — Inpatient Hospital Stay (HOSPITAL_BASED_OUTPATIENT_CLINIC_OR_DEPARTMENT_OTHER): Admitting: Hematology and Oncology

## 2023-12-11 ENCOUNTER — Inpatient Hospital Stay (HOSPITAL_COMMUNITY)
Admission: EM | Admit: 2023-12-11 | Discharge: 2023-12-14 | DRG: 315 | Disposition: A | Discharge status: Home / Self Care | Payer: 59 | Source: Ambulatory Visit | Attending: Family Medicine | Admitting: Family Medicine

## 2023-12-11 ENCOUNTER — Other Ambulatory Visit: Payer: Self-pay

## 2023-12-11 ENCOUNTER — Encounter: Payer: Self-pay | Admitting: Hematology and Oncology

## 2023-12-11 ENCOUNTER — Inpatient Hospital Stay

## 2023-12-11 VITALS — BP 105/72 | HR 104 | Temp 98.4°F | Resp 18

## 2023-12-11 DIAGNOSIS — Z853 Personal history of malignant neoplasm of breast: Secondary | ICD-10-CM | POA: Diagnosis not present

## 2023-12-11 DIAGNOSIS — Z86711 Personal history of pulmonary embolism: Secondary | ICD-10-CM

## 2023-12-11 DIAGNOSIS — T80211A Bloodstream infection due to central venous catheter, initial encounter: Secondary | ICD-10-CM | POA: Diagnosis not present

## 2023-12-11 DIAGNOSIS — B958 Unspecified staphylococcus as the cause of diseases classified elsewhere: Secondary | ICD-10-CM | POA: Diagnosis not present

## 2023-12-11 DIAGNOSIS — T80219A Unspecified infection due to central venous catheter, initial encounter: Secondary | ICD-10-CM | POA: Diagnosis not present

## 2023-12-11 DIAGNOSIS — Z79899 Other long term (current) drug therapy: Secondary | ICD-10-CM

## 2023-12-11 DIAGNOSIS — C251 Malignant neoplasm of body of pancreas: Secondary | ICD-10-CM

## 2023-12-11 DIAGNOSIS — Z79811 Long term (current) use of aromatase inhibitors: Secondary | ICD-10-CM | POA: Diagnosis not present

## 2023-12-11 DIAGNOSIS — Z6833 Body mass index (BMI) 33.0-33.9, adult: Secondary | ICD-10-CM

## 2023-12-11 DIAGNOSIS — Z7901 Long term (current) use of anticoagulants: Secondary | ICD-10-CM | POA: Diagnosis not present

## 2023-12-11 DIAGNOSIS — T451X5A Adverse effect of antineoplastic and immunosuppressive drugs, initial encounter: Secondary | ICD-10-CM | POA: Diagnosis present

## 2023-12-11 DIAGNOSIS — Z923 Personal history of irradiation: Secondary | ICD-10-CM | POA: Diagnosis not present

## 2023-12-11 DIAGNOSIS — B957 Other staphylococcus as the cause of diseases classified elsewhere: Secondary | ICD-10-CM | POA: Diagnosis not present

## 2023-12-11 DIAGNOSIS — D709 Neutropenia, unspecified: Secondary | ICD-10-CM | POA: Diagnosis present

## 2023-12-11 DIAGNOSIS — I10 Essential (primary) hypertension: Secondary | ICD-10-CM | POA: Diagnosis present

## 2023-12-11 DIAGNOSIS — C787 Secondary malignant neoplasm of liver and intrahepatic bile duct: Secondary | ICD-10-CM

## 2023-12-11 DIAGNOSIS — E669 Obesity, unspecified: Secondary | ICD-10-CM | POA: Diagnosis present

## 2023-12-11 DIAGNOSIS — E119 Type 2 diabetes mellitus without complications: Secondary | ICD-10-CM

## 2023-12-11 DIAGNOSIS — T82898A Other specified complication of vascular prosthetic devices, implants and grafts, initial encounter: Secondary | ICD-10-CM | POA: Diagnosis not present

## 2023-12-11 DIAGNOSIS — F17210 Nicotine dependence, cigarettes, uncomplicated: Secondary | ICD-10-CM | POA: Diagnosis present

## 2023-12-11 DIAGNOSIS — Z888 Allergy status to other drugs, medicaments and biological substances status: Secondary | ICD-10-CM | POA: Diagnosis not present

## 2023-12-11 DIAGNOSIS — Z803 Family history of malignant neoplasm of breast: Secondary | ICD-10-CM | POA: Diagnosis not present

## 2023-12-11 DIAGNOSIS — Z5320 Procedure and treatment not carried out because of patient's decision for unspecified reasons: Secondary | ICD-10-CM | POA: Diagnosis present

## 2023-12-11 DIAGNOSIS — E876 Hypokalemia: Secondary | ICD-10-CM | POA: Diagnosis present

## 2023-12-11 DIAGNOSIS — E1169 Type 2 diabetes mellitus with other specified complication: Secondary | ICD-10-CM | POA: Diagnosis not present

## 2023-12-11 DIAGNOSIS — Y848 Other medical procedures as the cause of abnormal reaction of the patient, or of later complication, without mention of misadventure at the time of the procedure: Secondary | ICD-10-CM | POA: Diagnosis present

## 2023-12-11 DIAGNOSIS — R7881 Bacteremia: Secondary | ICD-10-CM | POA: Diagnosis not present

## 2023-12-11 DIAGNOSIS — Z83719 Family history of colon polyps, unspecified: Secondary | ICD-10-CM

## 2023-12-11 DIAGNOSIS — C259 Malignant neoplasm of pancreas, unspecified: Secondary | ICD-10-CM | POA: Diagnosis not present

## 2023-12-11 DIAGNOSIS — Z9189 Other specified personal risk factors, not elsewhere classified: Secondary | ICD-10-CM

## 2023-12-11 DIAGNOSIS — D61818 Other pancytopenia: Secondary | ICD-10-CM | POA: Insufficient documentation

## 2023-12-11 DIAGNOSIS — T80212A Local infection due to central venous catheter, initial encounter: Secondary | ICD-10-CM | POA: Diagnosis not present

## 2023-12-11 DIAGNOSIS — Z0389 Encounter for observation for other suspected diseases and conditions ruled out: Secondary | ICD-10-CM | POA: Diagnosis not present

## 2023-12-11 HISTORY — PX: IR REMOVAL TUN ACCESS W/ PORT W/O FL MOD SED: IMG2290

## 2023-12-11 LAB — CBC WITH DIFFERENTIAL/PLATELET
Abs Immature Granulocytes: 0.01 10*3/uL (ref 0.00–0.07)
Basophils Absolute: 0 10*3/uL (ref 0.0–0.1)
Basophils Relative: 0 %
Eosinophils Absolute: 0.1 10*3/uL (ref 0.0–0.5)
Eosinophils Relative: 2 %
HCT: 38.9 % (ref 36.0–46.0)
Hemoglobin: 12.6 g/dL (ref 12.0–15.0)
Immature Granulocytes: 0 %
Lymphocytes Relative: 50 %
Lymphs Abs: 1.5 10*3/uL (ref 0.7–4.0)
MCH: 25.1 pg — ABNORMAL LOW (ref 26.0–34.0)
MCHC: 32.4 g/dL (ref 30.0–36.0)
MCV: 77.6 fL — ABNORMAL LOW (ref 80.0–100.0)
Monocytes Absolute: 0.2 10*3/uL (ref 0.1–1.0)
Monocytes Relative: 7 %
Neutro Abs: 1.3 10*3/uL — ABNORMAL LOW (ref 1.7–7.7)
Neutrophils Relative %: 41 %
Platelets: 292 10*3/uL (ref 150–400)
RBC: 5.01 MIL/uL (ref 3.87–5.11)
RDW: 17.2 % — ABNORMAL HIGH (ref 11.5–15.5)
WBC: 3.1 10*3/uL — ABNORMAL LOW (ref 4.0–10.5)
nRBC: 0 % (ref 0.0–0.2)

## 2023-12-11 LAB — PROTIME-INR
INR: 1.2 (ref 0.8–1.2)
Prothrombin Time: 15.5 s — ABNORMAL HIGH (ref 11.4–15.2)

## 2023-12-11 LAB — COMPREHENSIVE METABOLIC PANEL
ALT: 54 U/L — ABNORMAL HIGH (ref 0–44)
AST: 76 U/L — ABNORMAL HIGH (ref 15–41)
Albumin: 3.7 g/dL (ref 3.5–5.0)
Alkaline Phosphatase: 241 U/L — ABNORMAL HIGH (ref 38–126)
Anion gap: 11 (ref 5–15)
BUN: 12 mg/dL (ref 8–23)
CO2: 21 mmol/L — ABNORMAL LOW (ref 22–32)
Calcium: 9.1 mg/dL (ref 8.9–10.3)
Chloride: 101 mmol/L (ref 98–111)
Creatinine, Ser: 0.62 mg/dL (ref 0.44–1.00)
GFR, Estimated: >60 mL/min (ref >60)
Glucose, Bld: 115 mg/dL — ABNORMAL HIGH (ref 70–99)
Potassium: 3.8 mmol/L (ref 3.5–5.1)
Sodium: 133 mmol/L — ABNORMAL LOW (ref 135–145)
Total Bilirubin: 0.8 mg/dL (ref <1.2)
Total Protein: 7.5 g/dL (ref 6.5–8.1)

## 2023-12-11 LAB — APTT: aPTT: 28 s (ref 24–36)

## 2023-12-11 LAB — I-STAT CG4 LACTIC ACID, ED: Lactic Acid, Venous: 1.3 mmol/L (ref 0.5–1.9)

## 2023-12-11 MED ORDER — ONDANSETRON HCL 4 MG/2ML IJ SOLN: 4.0000 mg | Freq: Four times a day (QID) | INTRAMUSCULAR | Status: DC | PRN

## 2023-12-11 MED ORDER — ACETAMINOPHEN 325 MG PO TABS
650.0000 mg | ORAL_TABLET | Freq: Four times a day (QID) | ORAL | Status: DC | PRN
  Administered 2023-12-12 – 2023-12-14 (×9): 650 mg via ORAL
  Filled 2023-12-11 (×9): qty 2

## 2023-12-11 MED ORDER — LIDOCAINE-EPINEPHRINE 1 %-1:100000 IJ SOLN
INTRAMUSCULAR | Status: AC
Start: 2023-12-11 — End: ?
  Filled 2023-12-11: qty 1

## 2023-12-11 MED ORDER — HEPARIN SOD (PORK) LOCK FLUSH 100 UNIT/ML IV SOLN
500.0000 [IU] | Freq: Once | INTRAVENOUS | Status: AC | PRN
  Administered 2023-12-11: 500 [IU]

## 2023-12-11 MED ORDER — ACETAMINOPHEN 650 MG RE SUPP: 650.0000 mg | Freq: Four times a day (QID) | RECTAL | Status: DC | PRN

## 2023-12-11 MED ORDER — APIXABAN 5 MG PO TABS
5.0000 mg | ORAL_TABLET | Freq: Two times a day (BID) | ORAL | Status: DC
  Administered 2023-12-11 – 2023-12-14 (×6): 5 mg via ORAL
  Filled 2023-12-11 (×6): qty 1

## 2023-12-11 MED ORDER — SENNOSIDES-DOCUSATE SODIUM 8.6-50 MG PO TABS: 1.0000 | ORAL_TABLET | Freq: Every evening | ORAL | Status: DC | PRN

## 2023-12-11 MED ORDER — PEGFILGRASTIM-CBQV 6 MG/0.6ML ~~LOC~~ SOSY
6.0000 mg | PREFILLED_SYRINGE | Freq: Once | SUBCUTANEOUS | Status: AC
  Administered 2023-12-11: 6 mg via SUBCUTANEOUS
  Filled 2023-12-11: qty 0.6

## 2023-12-11 MED ORDER — LOSARTAN POTASSIUM 50 MG PO TABS
25.0000 mg | ORAL_TABLET | Freq: Every day | ORAL | Status: DC
  Administered 2023-12-11 – 2023-12-14 (×4): 25 mg via ORAL
  Filled 2023-12-11 (×4): qty 1

## 2023-12-11 MED ORDER — SODIUM CHLORIDE 0.9% FLUSH
10.0000 mL | INTRAVENOUS | Status: DC | PRN
  Administered 2023-12-11: 10 mL

## 2023-12-11 MED ORDER — VANCOMYCIN HCL IN DEXTROSE 1-5 GM/200ML-% IV SOLN
1000.0000 mg | Freq: Once | INTRAVENOUS | Status: AC
Start: 2023-12-11 — End: 2023-12-11
  Administered 2023-12-11: 1000 mg via INTRAVENOUS
  Filled 2023-12-11: qty 200

## 2023-12-11 MED ORDER — VANCOMYCIN HCL 750 MG/150ML IV SOLN
750.0000 mg | Freq: Once | INTRAVENOUS | Status: AC
Start: 2023-12-11 — End: 2023-12-11
  Administered 2023-12-11: 750 mg via INTRAVENOUS
  Filled 2023-12-11: qty 150

## 2023-12-11 MED ORDER — ONDANSETRON HCL 4 MG PO TABS: 4.0000 mg | ORAL_TABLET | Freq: Four times a day (QID) | ORAL | Status: DC | PRN

## 2023-12-11 MED ORDER — VANCOMYCIN HCL 500 MG/100ML IV SOLN
500.0000 mg | Freq: Two times a day (BID) | INTRAVENOUS | Status: DC
  Administered 2023-12-12 – 2023-12-14 (×5): 500 mg via INTRAVENOUS
  Filled 2023-12-11 (×7): qty 100

## 2023-12-11 MED ORDER — SODIUM CHLORIDE 0.9 % IV SOLN
2.0000 g | Freq: Three times a day (TID) | INTRAVENOUS | Status: DC
Start: 2023-12-12 — End: 2023-12-14
  Administered 2023-12-12 – 2023-12-14 (×8): 2 g via INTRAVENOUS
  Filled 2023-12-11 (×8): qty 12.5

## 2023-12-11 MED ORDER — SODIUM CHLORIDE 0.9 % IV SOLN
2.0000 g | Freq: Once | INTRAVENOUS | Status: AC
  Administered 2023-12-11: 2 g via INTRAVENOUS
  Filled 2023-12-11: qty 12.5

## 2023-12-11 MED ORDER — LIDOCAINE-EPINEPHRINE 1 %-1:100000 IJ SOLN
20.0000 mL | Freq: Once | INTRAMUSCULAR | Status: AC
Start: 2023-12-11 — End: 2023-12-11
  Administered 2023-12-11: 10 mL via INTRADERMAL

## 2023-12-11 NOTE — Progress Notes (Signed)
Patient here for pump dc and Udenyca injection c/o pain at port site. Port flushed with saline and heparin prior to deaccess. Site had old blood drainage, site oozed with sanguinopurulent drainage when needle was removed. MD notified, on call notified. Only abnormal vitals HR 104, no fever. Patient sent to ED per Dr. Bertis Ruddy. Patient transported to ED in wheelchair with gauze covering leaking site.

## 2023-12-11 NOTE — ED Triage Notes (Signed)
Pt arrives from [facility]. Pt completed an chemo infusion via her port. She went to have the pump stopped and the provider was concerned that her port is infected. Pt denies fevers. Pt is AxOx4.

## 2023-12-11 NOTE — Assessment & Plan Note (Signed)
Due to recent chemotherapy She will receive G-CSF support today before transfer Continue supportive care

## 2023-12-11 NOTE — H&P (Signed)
History and Physical    Patient: Ashley Clark DOB: Sep 02, 1961 DOA: 12/11/2023 DOS: the patient was seen and examined on 12/11/2023 PCP: [name], PA  Patient coming from: Home  Chief Complaint:  Chief Complaint  Patient presents with   Infected Port   HPI: Ashley Clark is a 62 y.o. female with medical history significant of T2DM, HTN, left breast cancer in 2019 s/p left lumpectomy, and radiation, provoked PE on Eliquis, and a recently diagnosed pancreatic cancer with liver metastasis who presented from the cancer center for evaluation of infected port. Patient presented to her cancer center earlier today for cycle 3 of her chemotherapy. After the treatment, they planned for port de access and as soon as the needle was removed from her port site, pus was draining from the site. Patient also endorsed pain around the port site but denied any fevers, chills, nausea, vomiting, abdominal pain or chest pain. She was advised to present to the ED for port removal and IV antibiotics.  ED course: Initial vitals showed temp 98.4, RR 18, HR 104, BP 105/72, SpO2 100% on room air Labs show sodium 133, bicarb 21, K+ 3.8, creatinine 0.62, AST/ALT 76/54, alk phos 241, bili 0.8, WBC 3.1, Hgb 12.6, platelet 292, PT/INR 15.5/1.2, lactic acid 1.3 CXR with no active disease IR was consulted for port removal Patient was started on IV vancomycin and cefepime TRH was consulted for admission  Review of Systems: As mentioned in the history of present illness. All other systems reviewed and are negative. Past Medical History:  Diagnosis Date   Allergy    Back pain    Cancer (HCC) 2019   left breast cancer-last radiation Dec.2019   Diabetes mellitus without complication (HCC)    Medical history non-contributory    Personal history of radiation therapy    Prediabetes    Tiredness    Vitamin D deficiency    Past Surgical History:  Procedure Laterality Date   BREAST BIOPSY Left  08/04/2018   x3   BREAST LUMPECTOMY Left    09-29-18   BREAST LUMPECTOMY WITH RADIOACTIVE SEED AND SENTINEL LYMPH NODE BIOPSY Left 09/29/2018   Procedure: LEFT BREAST LUMPECTOMY WITH RADIOACTIVE SEED AND SENTINEL LYMPH NODE BIOPSY;  Surgeon: [name], MD;  Location: [facility];  Service: General;  Laterality: Left;   COLONOSCOPY  2016   IR IMAGING GUIDED PORT INSERTION  11/03/2023   POLYPECTOMY     WISDOM TOOTH EXTRACTION     Social History:  reports that she quit smoking about 8 years ago. Her smoking use included cigarettes. She has never used smokeless tobacco. She reports that she does not currently use alcohol after a past usage of about 4.0 standard drinks of alcohol per week. She reports that she does not use drugs.  Allergies  Allergen Reactions   Irinotecan Liposome Shortness Of Breath and Other (See Comments)    ("Onivyde," given via IV) Pt had hypersensitivity rxn on 11/11/2023, see hypersensitivity rxn note for details. Pt had s/s of back pain, abdominal discomfort, SOB, flushing, and hypotension.     Family History  Problem Relation Age of Onset   Breast cancer Mother 58   Colon polyps Mother    Colon cancer Neg Hx    Rectal cancer Neg Hx    Stomach cancer Neg Hx    Esophageal cancer Neg Hx     Prior to Admission medications   Medication Sig Start Date End Date Taking? Authorizing Provider  apixaban (  ELIQUIS) 5 MG TABS tablet Take 1 tablet (5 mg total) by mouth 2 (two) times daily. 11/25/23  Yes Carlean Jews, NP  cetirizine (ZYRTEC) 10 MG chewable tablet Chew 10 mg by mouth daily.   Yes [provider]  lidocaine-prilocaine (EMLA) cream Apply to affected area once Patient taking differently: Apply 1 Application topically See admin instructions. Apply to affected area for port access 11/03/23  Yes [name], MD  losartan (COZAAR) 25 MG tablet Take 1 tablet (25 mg total) by mouth daily for kidney protection. 06/26/22  Yes   ondansetron  (ZOFRAN) 8 MG tablet Take 1 tablet (8 mg total) by mouth every 8 (eight) hours as needed for nausea or vomiting. Start on the third day after irinotecan 11/03/23  Yes [name], MD  prochlorperazine (COMPAZINE) 10 MG tablet Take 1 tablet (10 mg total) by mouth every 6 (six) hours as needed for nausea or vomiting. 11/03/23  Yes [name], MD    Physical Exam: Vitals:   12/11/23 1803 12/11/23 1808 12/11/23 1934 12/11/23 2050  BP: 132/86   120/74  Pulse: 76     Resp: 17   16  Temp:   97.8 F (36.6 C) 99.2 F (37.3 C)  TempSrc:   Oral Oral  SpO2: 99%   98%  Weight:  80.7 kg    Height:  5' 1.5" (1.562 m)     General: Well-appearing woman laying in bed. No acute distress. HEENT: [place]/AT. Anicteric sclera.  Chest: Port site on R upper chest covered with bandage, no surrounding erythema CV: RRR. No murmurs, rubs, or gallops. No LE edema Pulmonary: Lungs CTAB. Normal effort. No wheezing or rales. Abdominal: Soft, nontender, nondistended. Normal bowel sounds. Extremities: Palpable radial and DP pulses. Normal ROM. Skin: Warm and dry. No obvious rash or lesions. Neuro: A&Ox3. Moves all extremities. Normal sensation to light touch. No focal deficit. Psych: Anxious mood  Data Reviewed: Labs show sodium 133, bicarb 21, K+ 3.8, creatinine 0.62, AST/ALT 76/54, alk phos 241, bili 0.8, WBC 3.1, Hgb 12.6, platelet 292, PT/INR 15.5/1.2, lactic acid 1.3 CXR with no active disease  Assessment and Plan: Ashley Clark is a 61 y.o. female with medical history significant of T2DM, HTN, left breast cancer in 2019 s/p left lumpectomy, and radiation, provoked PE on Eliquis, and a recently diagnosed pancreatic cancer with liver metastasis who presented from the cancer center for evaluation of infected port and admitted for further management  # Infected venous access port # Neutropenic infection Patient presented port be accessed today at her cancer center after receiving cycle 3 of her chemotherapy and was  found to have purulent drainage from the port sites.  Patient reports tenderness at the site but denies any fevers or chills. Status post port removal by IR.  She remains afebrile and hemodynamically stable. -IR consulted, appreciate assistance with port removal -Continue IV vancomycin and cefepime -Follow-up blood cultures -Trend CBC, fever curve  # Metastatic pancreatic cancer # Hx of breast cancer Patient diagnosed with pancreatic cancer with liver mets in November. Patient completed cycle 3 of 6 of chemotherapy today at the cancer center. -Follow-up with oncology in the outpatient  # Hx of PE Patient was found to have a PE in November after her diagnosis of pancreatic cancer.  DVT studies were negative.  PE was thought to be provoked in the setting of her new malignancy. -Continue Eliquis 5 mg twice daily  # HTN BP stable with SBP in the 120s to 130s -  Continue losartan 25 mg daily  # Neutropenia WBC down to 3.1. Due to recent chemotherapy. Received G-CSF today at the cancer center prior to transfer to the hospital. -Trend CBC  # T2DM Last A1c 6.8% 1 year ago.  Blood sugar 115 on CMP today. -SSI with meals, CBG monitoring -Follow-up repeat A1c    Advance Care Planning:   Code Status: Full Code   Consults: IR, oncology  Family Communication: No family at bedside  Severity of Illness: The appropriate patient status for this patient is INPATIENT. Inpatient status is judged to be reasonable and necessary in order to provide the required intensity of service to ensure the patient's safety. The patient's presenting symptoms, physical exam findings, and initial radiographic and laboratory data in the context of their chronic comorbidities is felt to place them at high risk for further clinical deterioration. Furthermore, it is not anticipated that the patient will be medically stable for discharge from the hospital within 2 midnights of admission.   * I certify that at the point of  admission it is my clinical judgment that the patient will require inpatient hospital care spanning beyond 2 midnights from the point of admission due to high intensity of service, high risk for further deterioration and high frequency of surveillance required.*  Author: Steffanie Rainwater, MD 12/11/2023 10:05 PM  For on call review [email or web address].

## 2023-12-11 NOTE — Assessment & Plan Note (Signed)
The patient just completed cycle 3 of chemotherapy today, unfortunately, complicated by neutropenic infection I have spoken with the patient I recommend admission to the hospital with IR consult for port removal, cultures and IV antibiotics She agree with the plan of care

## 2023-12-11 NOTE — ED Provider Notes (Addendum)
Needs admission for port infection. IR is actively removing port. Physical Exam  BP (!) 118/53 (BP Location: Left Arm)   Pulse 70   Temp 98.4 F (36.9 C) (Oral)   Resp 15   LMP 03/12/2015   SpO2 98%   Physical Exam  Procedures  Procedures  ED Course / MDM    Medical Decision Making Amount and/or Complexity of Data Reviewed Labs: ordered. Radiology: ordered.  Risk Prescription drug management. Decision regarding hospitalization.   When CBC returned. Admit to medical service.  Consult: [facility] hospitalist Dr.Amponsah to admit.       Arby Barrette, MD 12/11/23 1704    Arby Barrette, MD 12/11/23 [phone]

## 2023-12-11 NOTE — ED Notes (Signed)
Patient to IR to have port a cath removed from her right chest via stretcher.

## 2023-12-11 NOTE — ED Provider Notes (Signed)
Ashley Clark AT [facility] Provider Note   CSN: [id number] Arrival date & time: 12/11/23  1455     History  Chief Complaint  Patient presents with   Infected Port    Ashley Clark is a 62 y.o. female.  Patient is a 62 year old female with a history of diabetes, breast cancer who has recently had a new port put in on 11/03/2023 who is currently getting chemotherapy and typically gets accessed on a Wednesday and deaccessed on a Friday who presented to Dr. Bertis Ruddy office today to get her port deaccessed.  She reports for the last 2 days her port has become increasingly painful with redness and when they removed the access today there were some pus that drained from it.  She denies systemic symptoms of fever, chills, malaise.  She has had no nausea or vomiting.  The history is provided by the patient.       Home Medications Prior to Admission medications   Medication Sig Start Date End Date Taking? Authorizing Provider  apixaban (ELIQUIS) 5 MG TABS tablet Take 1 tablet (5 mg total) by mouth 2 (two) times daily. 11/25/23   Carlean Jews, NP  cetirizine (ZYRTEC) 10 MG chewable tablet Chew 10 mg by mouth daily.    [provider]  lidocaine-prilocaine (EMLA) cream Apply to affected area once 11/03/23   [name], MD  losartan (COZAAR) 25 MG tablet Take 1 tablet (25 mg total) by mouth daily for kidney protection. 06/26/22     ondansetron (ZOFRAN) 8 MG tablet Take 1 tablet (8 mg total) by mouth every 8 (eight) hours as needed for nausea or vomiting. Start on the third day after irinotecan 11/03/23   [name], MD  prochlorperazine (COMPAZINE) 10 MG tablet Take 1 tablet (10 mg total) by mouth every 6 (six) hours as needed for nausea or vomiting. 11/03/23   [name], MD      Allergies    Irinotecan liposome    Review of Systems   Review of Systems  Physical Exam Updated Vital Signs BP (!) 118/53 (BP Location: Left Arm)   Pulse 70   Temp  98.4 F (36.9 C) (Oral)   Resp 15   LMP 03/12/2015   SpO2 98%  Physical Exam Vitals and nursing note reviewed.  Constitutional:      General: She is not in acute distress.    Appearance: She is well-developed.  HENT:     Head: Normocephalic and atraumatic.  Eyes:     Pupils: Pupils are equal, round, and reactive to light.  Cardiovascular:     Rate and Rhythm: Normal rate and regular rhythm.     Heart sounds: Normal heart sounds. No murmur heard.    No friction rub.  Pulmonary:     Effort: Pulmonary effort is normal.     Breath sounds: Normal breath sounds. No wheezing or rales.     Comments: Port present in the right upper chest with tenderness, erythema and some minimal bloody drainage.  No tenderness up into the neck and no swelling of the neck. Abdominal:     General: Bowel sounds are normal. There is no distension.     Palpations: Abdomen is soft.     Tenderness: There is no abdominal tenderness. There is no guarding or rebound.  Musculoskeletal:        General: No tenderness. Normal range of motion.     Comments: No edema  Skin:    General: Skin  is warm and dry.     Findings: No rash.  Neurological:     Mental Status: She is alert and oriented to person, place, and time.     Cranial Nerves: No cranial nerve deficit.  Psychiatric:        Behavior: Behavior normal.     ED Results / Procedures / Treatments   Labs (all labs ordered are listed, but only abnormal results are displayed) Labs Reviewed  COMPREHENSIVE METABOLIC PANEL - Abnormal; Notable for the following components:      Result Value   Sodium 133 (*)    CO2 21 (*)    Glucose, Bld 115 (*)    AST 76 (*)    ALT 54 (*)    Alkaline Phosphatase 241 (*)    All other components within normal limits  CBC WITH DIFFERENTIAL/PLATELET - Abnormal; Notable for the following components:   WBC 3.1 (*)    MCV 77.6 (*)    MCH 25.1 (*)    RDW 17.2 (*)    Neutro Abs 1.3 (*)    All other components within normal  limits  PROTIME-INR - Abnormal; Notable for the following components:   Prothrombin Time 15.5 (*)    All other components within normal limits  CULTURE, BLOOD (ROUTINE X 2)  CULTURE, BLOOD (ROUTINE X 2)  APTT  I-STAT CG4 LACTIC ACID, ED  I-STAT CG4 LACTIC ACID, ED    EKG None  Radiology No results found.  Procedures Procedures    Medications Ordered in ED Medications  vancomycin (VANCOCIN) IVPB 1000 mg/200 mL premix (1,000 mg Intravenous New Bag/Given 12/11/23 1617)    And  vancomycin (VANCOREADY) IVPB 750 mg/150 mL (has no administration in time range)  ceFEPIme (MAXIPIME) 2 g in sodium chloride 0.9 % 100 mL IVPB (has no administration in time range)  ceFEPIme (MAXIPIME) 2 g in sodium chloride 0.9 % 100 mL IVPB (2 g Intravenous New Bag/Given 12/11/23 1616)  lidocaine-EPINEPHrine (XYLOCAINE W/EPI) 1 %-1:100000 (with pres) injection 20 mL (10 mLs Intradermal Given 12/11/23 1653)    ED Course/ Medical Decision Making/ A&P                                 Medical Decision Making Amount and/or Complexity of Data Reviewed External Data Reviewed: notes. Labs: ordered. Decision-making details documented in ED Course. Radiology: ordered and independent interpretation performed. Decision-making details documented in ED Course.  Risk Prescription drug management.   Pt with multiple medical problems and comorbidities and presenting today with a complaint that caries a high risk for morbidity and mortality.  Here today with evidence of a port infection.  Patient's oncologist recommended admission for IV antibiotics, cultures and port removal.  IR has already been in contact and will attempt to take the port out today.  Patient given Vanco and cefepime per pharmacy consult.  I independently interpreted patient's labs that show a normal lactic acid, CMP with normal creatinine and potassium, mild elevated LFTs with an AST of 76 and ALT of 54, CBC with persistent leukopenia but no  evidence of significant neutropenia at this time with a white count of 3.1 and normal hemoglobin and platelet count. I have independently visualized and interpreted pt's images today. Chest x-ray without acute findings.         Final Clinical Impression(s) / ED Diagnoses Final diagnoses:  Infection due to Port-A-Cath, initial encounter    Rx / DC Orders  ED Discharge Orders     None         Gwyneth Sprout, MD 12/11/23 1718

## 2023-12-11 NOTE — Procedures (Addendum)
  Procedure:  Removal R chest port catheter moderate cloudy fluid noted from pocket, packed with iodoform gauze to close by secondary intention.   Preprocedure diagnosis: The encounter diagnosis was Infection due to Port-A-Cath, initial encounter. Postprocedure diagnosis: same EBL:    minimal Complications:   none immediate  Will f/u early next week for site check and packing change. See full dictation in [facility].  Thora Lance MD Main # [phone] Pager  [phone] Mobile [phone]

## 2023-12-11 NOTE — Progress Notes (Signed)
Pharmacy Antibiotic Note  Ashley Clark is a 62 y.o. female admitted on 12/11/2023 with infected venous access port/neutropenic.  Pharmacy has been consulted for vancomycin & cefepime dosing. AF. WBC on 12/11 3.2 with ANC 1.1  Plan: Cefepime 2 gm IV q8hrs Vancomycin 1000 mg + vancomycin 750 mg to make total loading dose = 1750 mg  Vancomycin 500 mg IV q12 for eAUC 480, Css min 14.8. SCr rounded up to 0.8, Vd 0.5 F/u renal function & culture data    Temp (24hrs), Avg:98.4 F (36.9 C), Min:98.4 F (36.9 C), Max:98.4 F (36.9 C)  Recent Labs  Lab 12/09/23 0905  WBC 3.2*  CREATININE 0.52    Estimated Creatinine Clearance: 71.8 mL/min (by C-G formula based on SCr of 0.52 mg/dL).    Allergies  Allergen Reactions   Irinotecan Liposome Other (See Comments)    Pt had hypersensitivity rxn on 11/11/2023, see hypersensitivity rxn note for details. Pt had s/s of back pain, abdominal discomfort, SOB, flushing, and hypotension.     Antimicrobials this admission: 12/13 Vancomycin>> 12/13 cefepime>> Dose adjustments this admission:  Microbiology results: 12/13 BCx2:   Thank you for allowing pharmacy to be a part of this patient's care.  Herby Abraham, Pharm.D Use secure chat for questions 12/11/2023 3:42 PM

## 2023-12-11 NOTE — Progress Notes (Signed)
Patient sent to ED room 13 via wheelchair with family member. Bedside report given to Marcello Moores, RN. Patient stable upon transfer.

## 2023-12-11 NOTE — Assessment & Plan Note (Signed)
Her port appears infected Per discussion with IR, the patient will be admitted for cultures, port removal and IV antibiotics She will be followed by her primary oncologist next week

## 2023-12-11 NOTE — Progress Notes (Signed)
[facility] OFFICE PROGRESS NOTE  Patient Care Team: [name], [name] as PCP - General (Nurse Practitioner) [name], MD as Consulting Physician (Hematology and Oncology) [name], MD as Consulting Physician (Radiation Oncology) [name], MD as Consulting Physician (General Surgery) [name], MD as Consulting Physician (Hematology and Oncology)  ASSESSMENT & PLAN:  Pancreatic cancer [facility]) The patient just completed cycle 3 of chemotherapy today, unfortunately, complicated by neutropenic infection I have spoken with the patient I recommend admission to the hospital with IR consult for port removal, cultures and IV antibiotics She agree with the plan of care  Infection of venous access port Her port appears infected Per discussion with IR, the patient will be admitted for cultures, port removal and IV antibiotics She will be followed by her primary oncologist next week  Pancytopenia, acquired (HCC) Due to recent chemotherapy She will receive G-CSF support today before transfer Continue supportive care  No orders of the defined types were placed in this encounter.   All questions were answered. The patient knows to call the clinic with any problems, questions or concerns. The total time spent in the appointment was 40 minutes encounter with patients including review of chart and various tests results, discussions about plan of care and coordination of care plan   Artis Delay, MD 12/11/2023 2:46 PM  INTERVAL HISTORY: Please see below for problem oriented charting. She is seen urgently due to concern for port infection.  The patient is currently receiving chemotherapy under the care of Dr. Mosetta Putt.  She had received cycle 3 of treatment and will plan for port de access today and G-CSF support.  As soon as the needle was removed from her port site, pus was draining from the port site.  The patient noted some pain at the port area.  She denies fever or  chills  REVIEW OF SYSTEMS:   Constitutional: Denies fevers, chills or abnormal weight loss Eyes: Denies blurriness of vision Ears, nose, mouth, throat, and face: Denies mucositis or sore throat Respiratory: Denies cough, dyspnea or wheezes Cardiovascular: Denies palpitation, chest discomfort or lower extremity swelling Gastrointestinal:  Denies nausea, heartburn or change in bowel habits Skin: Denies abnormal skin rashes Lymphatics: Denies new lymphadenopathy or easy bruising Neurological:Denies numbness, tingling or new weaknesses Behavioral/Psych: Mood is stable, no new changes  All other systems were reviewed with the patient and are negative.  I have reviewed the past medical history, past surgical history, social history and family history with the patient and they are unchanged from previous note.  ALLERGIES:  is allergic to irinotecan liposome.  MEDICATIONS:  Current Outpatient Medications  Medication Sig Dispense Refill   apixaban (ELIQUIS) 5 MG TABS tablet Take 1 tablet (5 mg total) by mouth 2 (two) times daily. 60 tablet 3   cetirizine (ZYRTEC) 10 MG chewable tablet Chew 10 mg by mouth daily.     lidocaine-prilocaine (EMLA) cream Apply to affected area once 30 g 3   losartan (COZAAR) 25 MG tablet Take 1 tablet (25 mg total) by mouth daily for kidney protection. 90 tablet 3   ondansetron (ZOFRAN) 8 MG tablet Take 1 tablet (8 mg total) by mouth every 8 (eight) hours as needed for nausea or vomiting. Start on the third day after irinotecan 30 tablet 1   prochlorperazine (COMPAZINE) 10 MG tablet Take 1 tablet (10 mg total) by mouth every 6 (six) hours as needed for nausea or vomiting. 30 tablet 1   No current facility-administered medications for this  visit.   Facility-Administered Medications Ordered in Other Visits  Medication Dose Route Frequency Provider Last Rate Last Admin   dextrose 5 % solution   Intravenous Continuous [name], MD 10 mL/hr at 11/25/23 1412 New Bag at  11/25/23 1412   heparin lock flush 100 unit/mL  500 Units Intracatheter Once PRN [name], MD       sodium chloride flush (NS) 0.9 % injection 10 mL  10 mL Intracatheter PRN [name], MD       sodium chloride flush (NS) 0.9 % injection 10 mL  10 mL Intracatheter PRN [name], MD   10 mL at 12/11/23 1405    SUMMARY OF ONCOLOGIC HISTORY: Oncology History  Malignant neoplasm of overlapping sites of left breast in female, estrogen receptor positive (HCC)  08/04/2018 Initial Diagnosis   Screening mammogram detected left breast mass at 9:30 position 1.2 x 1.2 x 1.1 cm.  Closer to the nipple at 9:30 position 0.8 cm lesion span 3.6 cm and a 1.5 cm apart.  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 position 2 cm from nipple, at 1 cm from nipple PASH, lymph node benign, ER 90%, PR 95%, Ki-67 20%, HER-2 negative by IHC 1+, T1CN0 stage Ia AJCC 8   09/29/2018 Surgery   Left lumpectomy: Grade 3 IDC, 1.7 cm, margins negative, lymphovascular invasion present, 0/2 lymph nodes negative, T1c N0 stage Ia   10/06/2018 Cancer Staging   Staging form: Breast, AJCC 8th Edition - Pathologic: Stage IA (pT1c, pN0(sn), cM0, G3, ER+, PR+, HER2-) - Signed by [name], MD on 10/06/2018   10/22/2018 Oncotype testing   Oncotype score 13: Distant recurrence at 9 years with hormone therapy alone 4%   11/12/2018 - 12/27/2018 Radiation Therapy   Adjuvant radiation therapy   12/27/2018 -  Anti-estrogen oral therapy   Antiestrogen therapy with letrozole 2.5 mg daily   11/30/2023 Genetic Testing   Negative genetic testing on the CancerNext-Expanded+RNAinsight panel.  VUS identified in KIT c.2083G>C and PMS2 c.-1C>A.  The report date is 11/30/2023.  The CancerNext-Expanded gene panel offered by [facility] and includes sequencing, rearrangement, and RNA analysis for the following 76 genes: AIP, ALK, APC, ATM, AXIN2, BAP1, BARD1, BMPR1A, BRCA1, BRCA2,  BRIP1, CDC73, CDH1, CDK4, CDKN1B, CDKN2A, CEBPA, CHEK2, CTNNA1, DDX41, DICER1, ETV6, FH, FLCN, GATA2, LZTR1, MAX, MBD4, MEN1, MET, MLH1, MSH2, MSH3, MSH6, MUTYH, NF1, NF2, NTHL1, PALB2, PHOX2B, PMS2, POT1, PRKAR1A, PTCH1, PTEN, RAD51C, RAD51D, RB1, RET, RUNX1, SDHA, SDHAF2, SDHB, SDHC, SDHD, SMAD4, SMARCA4, SMARCB1, SMARCE1, STK11, SUFU, TMEM127, TP53, TSC1, TSC2, VHL, and WT1 (sequencing and deletion/duplication); EGFR, HOXB13, KIT, MITF, PDGFRA, POLD1, and POLE (sequencing only); EPCAM and GREM1 (deletion/duplication only).    Pancreatic cancer (HCC)  11/03/2023 Initial Diagnosis   Pancreatic cancer (HCC)   11/11/2023 -  Chemotherapy   Patient is on Treatment Plan : PANCREAS NALIRIFOX D1, 15 Q28D     11/30/2023 Genetic Testing   Negative genetic testing on the CancerNext-Expanded+RNAinsight panel.  VUS identified in KIT c.2083G>C and PMS2 c.-1C>A.  The report date is 11/30/2023.  The CancerNext-Expanded gene panel offered by [facility] and includes sequencing, rearrangement, and RNA analysis for the following 76 genes: AIP, ALK, APC, ATM, AXIN2, BAP1, BARD1, BMPR1A, BRCA1, BRCA2, BRIP1, CDC73, CDH1, CDK4, CDKN1B, CDKN2A, CEBPA, CHEK2, CTNNA1, DDX41, DICER1, ETV6, FH, FLCN, GATA2, LZTR1, MAX, MBD4, MEN1, MET, MLH1, MSH2, MSH3, MSH6, MUTYH, NF1, NF2, NTHL1, PALB2, PHOX2B,  PMS2, POT1, PRKAR1A, PTCH1, PTEN, RAD51C, RAD51D, RB1, RET, RUNX1, SDHA, SDHAF2, SDHB, SDHC, SDHD, SMAD4, SMARCA4, SMARCB1, SMARCE1, STK11, SUFU, TMEM127, TP53, TSC1, TSC2, VHL, and WT1 (sequencing and deletion/duplication); EGFR, HOXB13, KIT, MITF, PDGFRA, POLD1, and POLE (sequencing only); EPCAM and GREM1 (deletion/duplication only).      PHYSICAL EXAMINATION: ECOG PERFORMANCE STATUS: 1 - Symptomatic but completely ambulatory  Vitals:   12/11/23 1400  BP: 105/72  Pulse: (!) 104  Resp: 18  Temp: 98.4 F (36.9 C)  SpO2: 100%   There were no vitals filed for this visit.  GENERAL:alert, no distress and  comfortable SKIN: Noted pocket of fluid at the port site.  When I try to express the fluid, pus was draining from the needle insertion site  LABORATORY DATA:  I have reviewed the data as listed    Component Value Date/Time   NA 136 12/09/2023 0905   NA 136 06/10/2022 1152   K 3.2 (L) 12/09/2023 0905   CL 103 12/09/2023 0905   CO2 23 12/09/2023 0905   GLUCOSE 180 (H) 12/09/2023 0905   BUN 7 (L) 12/09/2023 0905   BUN 7 (L) 06/10/2022 1152   CREATININE 0.52 12/09/2023 0905   CALCIUM 9.0 12/09/2023 0905   PROT 7.2 12/09/2023 0905   PROT 7.6 06/10/2022 1152   ALBUMIN 3.5 12/09/2023 0905   ALBUMIN 4.6 06/10/2022 1152   AST 45 (H) 12/09/2023 0905   ALT 42 12/09/2023 0905   ALKPHOS 253 (H) 12/09/2023 0905   BILITOT 0.4 12/09/2023 0905   GFRNONAA >60 12/09/2023 0905    No results found for: "SPEP", "UPEP"  Lab Results  Component Value Date   WBC 3.2 (L) 12/09/2023   NEUTROABS 1.1 (L) 12/09/2023   HGB 11.9 (L) 12/09/2023   HCT 36.4 12/09/2023   MCV 76.6 (L) 12/09/2023   PLT 260 12/09/2023      Chemistry      Component Value Date/Time   NA 136 12/09/2023 0905   NA 136 06/10/2022 1152   K 3.2 (L) 12/09/2023 0905   CL 103 12/09/2023 0905   CO2 23 12/09/2023 0905   BUN 7 (L) 12/09/2023 0905   BUN 7 (L) 06/10/2022 1152   CREATININE 0.52 12/09/2023 0905      Component Value Date/Time   CALCIUM 9.0 12/09/2023 0905   ALKPHOS 253 (H) 12/09/2023 0905   AST 45 (H) 12/09/2023 0905   ALT 42 12/09/2023 0905   BILITOT 0.4 12/09/2023 0905

## 2023-12-11 NOTE — ED Notes (Signed)
ED TO INPATIENT HANDOFF REPORT  ED Nurse Name and Phone #: Rebecca Eaton RN  S Name/Age/Gender Ashley Clark 62 y.o. female Room/Bed: WA13/WA13  Code Status   Code Status: Full Code  Home/SNF/Other Home Patient oriented to: self, place, time, and situation Is this baseline? Yes   Triage Complete: Triage complete  Chief Complaint Infected venous access port, initial encounter [T80.219A]  Triage Note Pt arrives from [facility]. Pt completed an chemo infusion via her port. She went to have the pump stopped and the provider was concerned that her port is infected. Pt denies fevers. Pt is AxOx4.    Allergies Allergies  Allergen Reactions   Irinotecan Liposome Shortness Of Breath and Other (See Comments)    ("Onivyde," given via IV) Pt had hypersensitivity rxn on 11/11/2023, see hypersensitivity rxn note for details. Pt had s/s of back pain, abdominal discomfort, SOB, flushing, and hypotension.     Level of Care/Admitting Diagnosis ED Disposition     ED Disposition  Admit   Condition  --   Comment  Hospital Area: [facility] [[id number]]  Level of Care: Med-Surg [[id number]]  May admit patient to [facility] or [facility] if equivalent level of care is available:: No  Covid Evaluation: Asymptomatic - no recent exposure (last 10 days) testing not required  Diagnosis: Infected venous access port, initial encounter [phone]  Admitting Physician: Steffanie Rainwater [[id number]]  Attending Physician: Steffanie Rainwater [[id number]]  Certification:: I certify this patient will need inpatient services for at least 2 midnights  Expected Medical Readiness: 12/13/2023          B Medical/Surgery History Past Medical History:  Diagnosis Date   Allergy    Back pain    Cancer (HCC) 2019   left breast cancer-last radiation Dec.2019   Diabetes mellitus without complication (HCC)    Medical history non-contributory    Personal history of radiation therapy     Prediabetes    Tiredness    Vitamin D deficiency    Past Surgical History:  Procedure Laterality Date   BREAST BIOPSY Left 08/04/2018   x3   BREAST LUMPECTOMY Left    09-29-18   BREAST LUMPECTOMY WITH RADIOACTIVE SEED AND SENTINEL LYMPH NODE BIOPSY Left 09/29/2018   Procedure: LEFT BREAST LUMPECTOMY WITH RADIOACTIVE SEED AND SENTINEL LYMPH NODE BIOPSY;  Surgeon: [name], MD;  Location: [facility];  Service: General;  Laterality: Left;   COLONOSCOPY  2016   IR IMAGING GUIDED PORT INSERTION  11/03/2023   POLYPECTOMY     WISDOM TOOTH EXTRACTION       A IV Location/Drains/Wounds Patient Lines/Drains/Airways Status     Active Line/Drains/Airways     Name Placement date Placement time Site Days   Implanted Port 11/03/23 Right Chest 11/03/23  1158  Chest  38   Peripheral IV 12/11/23 20 G 1" Left Antecubital 12/11/23  1549  Antecubital  less than 1   Peripheral IV 12/11/23 22 G 1" Posterior;Right Hand 12/11/23  1617  Hand  less than 1            Intake/Output Last 24 hours  Intake/Output Summary (Last 24 hours) at 12/11/2023 2021 Last data filed at 12/11/2023 2019 Gross per 24 hour  Intake 443.75 ml  Output --  Net 443.75 ml    Labs/Imaging Results for orders placed or performed during the hospital encounter of 12/11/23 (from the past 48 hours)  Comprehensive metabolic panel     Status: Abnormal  Collection Time: 12/11/23  3:54 PM  Result Value Ref Range   Sodium 133 (L) 135 - 145 mmol/L   Potassium 3.8 3.5 - 5.1 mmol/L   Chloride 101 98 - 111 mmol/L   CO2 21 (L) 22 - 32 mmol/L   Glucose, Bld 115 (H) 70 - 99 mg/dL    Comment: Glucose reference range applies only to samples taken after fasting for at least 8 hours.   BUN 12 8 - 23 mg/dL   Creatinine, Ser 2.95 0.44 - 1.00 mg/dL   Calcium 9.1 8.9 - 28.4 mg/dL   Total Protein 7.5 6.5 - 8.1 g/dL   Albumin 3.7 3.5 - 5.0 g/dL   AST 76 (H) 15 - 41 U/L   ALT 54 (H) 0 - 44 U/L   Alkaline Phosphatase  241 (H) 38 - 126 U/L   Total Bilirubin 0.8 <1.2 mg/dL   GFR, Estimated >13 >24 mL/min    Comment: (NOTE) Calculated using the CKD-EPI Creatinine Equation (2021)    Anion gap 11 5 - 15    Comment: Performed at [facility], [address] [place], [place] [address]  CBC with Differential     Status: Abnormal   Collection Time: 12/11/23  3:54 PM  Result Value Ref Range   WBC 3.1 (L) 4.0 - 10.5 K/uL   RBC 5.01 3.87 - 5.11 MIL/uL   Hemoglobin 12.6 12.0 - 15.0 g/dL   HCT 72.5 36.6 - 44.0 %   MCV 77.6 (L) 80.0 - 100.0 fL   MCH 25.1 (L) 26.0 - 34.0 pg   MCHC 32.4 30.0 - 36.0 g/dL   RDW 34.7 (H) 42.5 - 95.6 %   Platelets 292 150 - 400 K/uL   nRBC 0.0 0.0 - 0.2 %   Neutrophils Relative % 41 %   Neutro Abs 1.3 (L) 1.7 - 7.7 K/uL   Lymphocytes Relative 50 %   Lymphs Abs 1.5 0.7 - 4.0 K/uL   Monocytes Relative 7 %   Monocytes Absolute 0.2 0.1 - 1.0 K/uL   Eosinophils Relative 2 %   Eosinophils Absolute 0.1 0.0 - 0.5 K/uL   Basophils Relative 0 %   Basophils Absolute 0.0 0.0 - 0.1 K/uL   Immature Granulocytes 0 %   Abs Immature Granulocytes 0.01 0.00 - 0.07 K/uL    Comment: Performed at [facility], [address] [place], [place] [address]  Protime-INR     Status: Abnormal   Collection Time: 12/11/23  3:54 PM  Result Value Ref Range   Prothrombin Time 15.5 (H) 11.4 - 15.2 seconds   INR 1.2 0.8 - 1.2    Comment: (NOTE) INR goal varies based on device and disease states. Performed at [facility], [address], [place], [place] [address]   APTT     Status: None   Collection Time: 12/11/23  3:54 PM  Result Value Ref Range   aPTT 28 24 - 36 seconds    Comment: Performed at [facility], [address], [place], [place] [address]  I-Stat Lactic Acid, ED     Status: None   Collection Time: 12/11/23  4:11 PM  Result Value Ref Range   Lactic Acid, Venous 1.3 0.5 - 1.9 mmol/L   DG Chest Port 1  View Result Date: 12/11/2023 CLINICAL DATA:  Questionable sepsis - evaluate for abnormality. Suspected CT port-A-cath infection. No fever. EXAM: PORTABLE CHEST 1 VIEW COMPARISON:  10/26/2023. FINDINGS: Bilateral lung fields are clear.  Bilateral costophrenic angles are clear. Stable cardio-mediastinal silhouette. No acute osseous abnormalities. The soft tissues are within normal limits. Right-sided CT Port-A-Cath is seen with its tip overlying the cavoatrial junction region. IMPRESSION: No active disease. Electronically Signed   By: Jules Schick M.D.   On: 12/11/2023 18:00    Pending Labs Unresulted Labs (From admission, onward)     Start     Ordered   12/11/23 1521  Blood Culture (routine x 2)  (Undifferentiated presentation (screening labs and basic nursing orders))  BLOOD CULTURE X 2,   STAT      12/11/23 1521            Vitals/Pain Today's Vitals   12/11/23 1530 12/11/23 1803 12/11/23 1808 12/11/23 1934  BP: (!) 122/90 132/86    Pulse: 66 76    Resp: 16 17    Temp:    97.8 F (36.6 C)  TempSrc:    Oral  SpO2: 99% 99%    Weight:   80.7 kg   Height:   5' 1.5" (1.562 m)   PainSc:        Isolation Precautions No active isolations  Medications Medications  ceFEPIme (MAXIPIME) 2 g in sodium chloride 0.9 % 100 mL IVPB (has no administration in time range)  vancomycin (VANCOREADY) IVPB 500 mg/100 mL (has no administration in time range)  acetaminophen (TYLENOL) tablet 650 mg (has no administration in time range)    Or  acetaminophen (TYLENOL) suppository 650 mg (has no administration in time range)  senna-docusate (Senokot-S) tablet 1 tablet (has no administration in time range)  ondansetron (ZOFRAN) tablet 4 mg (has no administration in time range)    Or  ondansetron (ZOFRAN) injection 4 mg (has no administration in time range)  apixaban (ELIQUIS) tablet 5 mg (has no administration in time range)  losartan (COZAAR) tablet 25 mg (has no administration in time range)   ceFEPIme (MAXIPIME) 2 g in sodium chloride 0.9 % 100 mL IVPB (0 g Intravenous Stopped 12/11/23 1645)  vancomycin (VANCOCIN) IVPB 1000 mg/200 mL premix (0 mg Intravenous Stopped 12/11/23 1756)    And  vancomycin (VANCOREADY) IVPB 750 mg/150 mL (0 mg Intravenous Stopped 12/11/23 2019)  lidocaine-EPINEPHrine (XYLOCAINE W/EPI) 1 %-1:100000 (with pres) injection 20 mL (10 mLs Intradermal Given 12/11/23 1653)    Mobility walks     Focused Assessments     R Recommendations: See Admitting Provider Note  Report given to:   Additional Notes:

## 2023-12-11 NOTE — Plan of Care (Signed)

## 2023-12-12 ENCOUNTER — Encounter (HOSPITAL_COMMUNITY): Payer: Self-pay | Admitting: Student

## 2023-12-12 ENCOUNTER — Encounter: Payer: Self-pay | Admitting: Hematology

## 2023-12-12 DIAGNOSIS — C251 Malignant neoplasm of body of pancreas: Secondary | ICD-10-CM | POA: Diagnosis not present

## 2023-12-12 DIAGNOSIS — C259 Malignant neoplasm of pancreas, unspecified: Secondary | ICD-10-CM

## 2023-12-12 DIAGNOSIS — C787 Secondary malignant neoplasm of liver and intrahepatic bile duct: Secondary | ICD-10-CM | POA: Diagnosis not present

## 2023-12-12 DIAGNOSIS — T80219A Unspecified infection due to central venous catheter, initial encounter: Secondary | ICD-10-CM | POA: Diagnosis not present

## 2023-12-12 DIAGNOSIS — Z86711 Personal history of pulmonary embolism: Secondary | ICD-10-CM

## 2023-12-12 DIAGNOSIS — E669 Obesity, unspecified: Secondary | ICD-10-CM

## 2023-12-12 LAB — BLOOD CULTURE ID PANEL (REFLEXED) - BCID2

## 2023-12-12 MED ORDER — SODIUM CHLORIDE 0.9% FLUSH
3.0000 mL | Freq: Two times a day (BID) | INTRAVENOUS | Status: DC
  Administered 2023-12-12 – 2023-12-13 (×3): 3 mL via INTRAVENOUS

## 2023-12-12 MED ORDER — LORATADINE 10 MG PO TABS
10.0000 mg | ORAL_TABLET | Freq: Every day | ORAL | Status: DC
Start: 2023-12-13 — End: 2023-12-14
  Administered 2023-12-13 – 2023-12-14 (×2): 10 mg via ORAL
  Filled 2023-12-12 (×2): qty 1

## 2023-12-12 MED ORDER — HYDROMORPHONE HCL 1 MG/ML IJ SOLN: 0.5000 mg | Freq: Four times a day (QID) | INTRAMUSCULAR | Status: DC | PRN

## 2023-12-12 NOTE — Progress Notes (Signed)
PHARMACY - PHYSICIAN COMMUNICATION CRITICAL VALUE ALERT - BLOOD CULTURE IDENTIFICATION (BCID)  Ashley Clark is an 62 y.o. female who presented to [facility] on 12/11/2023 with a chief complaint of an infected port. After receiving cycle 3 of her chemo treatment, she was found to have purulent drainage from her port site--pt reports tenderness at the site but denies fevers/chills.   Assessment: 1/4 bottles growing GPC, BCID identifying as staph species  Name of physician (or Provider) Contacted: Dr. Imogene Burn  Current antibiotics:  Vancomycin 500mg  IV q12hrs Cefepime 2g IV q8hrs   Changes to prescribed antibiotics recommended:  Patient is on recommended antibiotics - No changes needed  Results for orders placed or performed during the hospital encounter of 12/11/23  Blood Culture ID Panel (Reflexed) (Collected: 12/11/2023  3:54 PM)  Result Value Ref Range   Enterococcus faecalis NOT DETECTED NOT DETECTED   Enterococcus Faecium NOT DETECTED NOT DETECTED   Listeria monocytogenes NOT DETECTED NOT DETECTED   Staphylococcus species DETECTED (A) NOT DETECTED   Staphylococcus aureus (BCID) NOT DETECTED NOT DETECTED   Staphylococcus epidermidis NOT DETECTED NOT DETECTED   Staphylococcus lugdunensis NOT DETECTED NOT DETECTED   Streptococcus species NOT DETECTED NOT DETECTED   Streptococcus agalactiae NOT DETECTED NOT DETECTED   Streptococcus pneumoniae NOT DETECTED NOT DETECTED   Streptococcus pyogenes NOT DETECTED NOT DETECTED   A.calcoaceticus-baumannii NOT DETECTED NOT DETECTED   Bacteroides fragilis NOT DETECTED NOT DETECTED   Enterobacterales NOT DETECTED NOT DETECTED   Enterobacter cloacae complex NOT DETECTED NOT DETECTED   Escherichia coli NOT DETECTED NOT DETECTED   Klebsiella aerogenes NOT DETECTED NOT DETECTED   Klebsiella oxytoca NOT DETECTED NOT DETECTED   Klebsiella pneumoniae NOT DETECTED NOT DETECTED   Proteus species NOT DETECTED NOT DETECTED   Salmonella species NOT  DETECTED NOT DETECTED   Serratia marcescens NOT DETECTED NOT DETECTED   Haemophilus influenzae NOT DETECTED NOT DETECTED   Neisseria meningitidis NOT DETECTED NOT DETECTED   Pseudomonas aeruginosa NOT DETECTED NOT DETECTED   Stenotrophomonas maltophilia NOT DETECTED NOT DETECTED   Candida albicans NOT DETECTED NOT DETECTED   Candida auris NOT DETECTED NOT DETECTED   Candida glabrata NOT DETECTED NOT DETECTED   Candida krusei NOT DETECTED NOT DETECTED   Candida parapsilosis NOT DETECTED NOT DETECTED   Candida tropicalis NOT DETECTED NOT DETECTED   Cryptococcus neoformans/gattii NOT DETECTED NOT DETECTED    Cherylin Mylar, PharmD Clinical Pharmacist  12/14/202412:15 PM

## 2023-12-12 NOTE — Assessment & Plan Note (Signed)
12-13-2023 stable.

## 2023-12-12 NOTE — Assessment & Plan Note (Signed)
12-12-2023 s/p port-a-cath removal on 12-11-2023. Remains on IV cefepime/vanco pending blood cx.  12-13-2023 blood cx growing staph species. Remains on IV cefepime/vanco.

## 2023-12-12 NOTE — Consult Note (Signed)
[facility] ADMISSION NOTE  Patient Care Team: [name], [name] as PCP - General (Nurse Practitioner) [name], MD as Consulting Physician (Hematology and Oncology) [name], MD as Consulting Physician (Radiation Oncology) [name], MD as Consulting Physician (General Surgery) [name], MD as Consulting Physician (Hematology and Oncology)   ASSESSMENT & PLAN:  Ashley Clark 62 y.o. female with medical history significant of T2DM, HTN, left breast cancer in 2019 s/p left lumpectomy, and radiation, provoked PE on Eliquis, and a recently diagnosed pancreatic cancer with liver metastasis who was admitted for port infection.  Patient was in the infusion room yesterday for de access.  Pain locally at the port site starting on day 2.  She denies any fever, chills.  Discussed with patient, remain inpatient is critical at this time until results available from blood cultures to determine final antibiotics.  Infected Mediport Port was removed on 12/13 Currently on empiric antibiotics with IV vancomycin and cefepime Continue follow-up with blood cultures  Metastatic pancreatic cancer  Completed cycle 3 of NALIRIFOX. Received Udenyca on 12/13 Will add daily claritin daily  Neutropenia Mild, received Udenyca on 12/13 Monitor blood counts while inpatient  History of PE Diagnosis in November the setting of malignancy Continue apixaban 5 mg twice daily  Other medical conditions management per primary.  Discharge planning Follow-up blood cultures.  Continue empiric antibiotics.  All questions were answered.    Melven Sartorius, MD 12/12/2023 11:27 AM   CHIEF COMPLAINTS/PURPOSE OF ADMISSION port infection  HISTORY OF PRESENTING ILLNESS:  Ashley Clark 62 y.o. female with medical history significant of T2DM, HTN, left breast cancer in 2019 s/p left lumpectomy, and radiation, provoked PE on Eliquis, and a recently diagnosed pancreatic cancer with liver  metastasis who was admitted for port infection.  Patient was in the infusion room yesterday for de access.  Pus was draining from the site at the time.  Endorse there was pain around the port started on day 2 and continue on day 3.  She denies any fever, chills.  Port was removed on admission.  Per report, cloudy fluid noted from the pocket.  Blood cultures were drawn and results are pending.  Patient was placed on empiric IV vancomycin and cefepime.  Patient was afebrile overnight.  Overall feeling well.  Dressing in place.  No fever, chills.  She denies any nausea, vomiting, abdominal pain, diarrhea.  She denies any headaches or neck pain.  No neuropathy.   Summary of oncologic history as follows: Oncology History  Malignant neoplasm of overlapping sites of left breast in female, estrogen receptor positive (HCC)  08/04/2018 Initial Diagnosis   Screening mammogram detected left breast mass at 9:30 position 1.2 x 1.2 x 1.1 cm.  Closer to the nipple at 9:30 position 0.8 cm lesion span 3.6 cm and a 1.5 cm apart.  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 position 2 cm from nipple, at 1 cm from nipple PASH, lymph node benign, ER 90%, PR 95%, Ki-67 20%, HER-2 negative by IHC 1+, T1CN0 stage Ia AJCC 8   09/29/2018 Surgery   Left lumpectomy: Grade 3 IDC, 1.7 cm, margins negative, lymphovascular invasion present, 0/2 lymph nodes negative, T1c N0 stage Ia   10/06/2018 Cancer Staging   Staging form: Breast, AJCC 8th Edition - Pathologic: Stage IA (pT1c, pN0(sn), cM0, G3, ER+, PR+, HER2-) - Signed by [name], MD on  10/06/2018   10/22/2018 Oncotype testing   Oncotype score 13: Distant recurrence at 9 years with hormone therapy alone 4%   11/12/2018 - 12/27/2018 Radiation Therapy   Adjuvant radiation therapy   12/27/2018 -  Anti-estrogen oral therapy   Antiestrogen therapy with letrozole 2.5 mg daily   11/30/2023 Genetic  Testing   Negative genetic testing on the CancerNext-Expanded+RNAinsight panel.  VUS identified in KIT c.2083G>C and PMS2 c.-1C>A.  The report date is 11/30/2023.  The CancerNext-Expanded gene panel offered by [facility] and includes sequencing, rearrangement, and RNA analysis for the following 76 genes: AIP, ALK, APC, ATM, AXIN2, BAP1, BARD1, BMPR1A, BRCA1, BRCA2, BRIP1, CDC73, CDH1, CDK4, CDKN1B, CDKN2A, CEBPA, CHEK2, CTNNA1, DDX41, DICER1, ETV6, FH, FLCN, GATA2, LZTR1, MAX, MBD4, MEN1, MET, MLH1, MSH2, MSH3, MSH6, MUTYH, NF1, NF2, NTHL1, PALB2, PHOX2B, PMS2, POT1, PRKAR1A, PTCH1, PTEN, RAD51C, RAD51D, RB1, RET, RUNX1, SDHA, SDHAF2, SDHB, SDHC, SDHD, SMAD4, SMARCA4, SMARCB1, SMARCE1, STK11, SUFU, TMEM127, TP53, TSC1, TSC2, VHL, and WT1 (sequencing and deletion/duplication); EGFR, HOXB13, KIT, MITF, PDGFRA, POLD1, and POLE (sequencing only); EPCAM and GREM1 (deletion/duplication only).    Pancreatic cancer (HCC)  11/03/2023 Initial Diagnosis   Pancreatic cancer (HCC)   11/11/2023 -  Chemotherapy   Patient is on Treatment Plan : PANCREAS NALIRIFOX D1, 15 Q28D     11/30/2023 Genetic Testing   Negative genetic testing on the CancerNext-Expanded+RNAinsight panel.  VUS identified in KIT c.2083G>C and PMS2 c.-1C>A.  The report date is 11/30/2023.  The CancerNext-Expanded gene panel offered by [facility] and includes sequencing, rearrangement, and RNA analysis for the following 76 genes: AIP, ALK, APC, ATM, AXIN2, BAP1, BARD1, BMPR1A, BRCA1, BRCA2, BRIP1, CDC73, CDH1, CDK4, CDKN1B, CDKN2A, CEBPA, CHEK2, CTNNA1, DDX41, DICER1, ETV6, FH, FLCN, GATA2, LZTR1, MAX, MBD4, MEN1, MET, MLH1, MSH2, MSH3, MSH6, MUTYH, NF1, NF2, NTHL1, PALB2, PHOX2B, PMS2, POT1, PRKAR1A, PTCH1, PTEN, RAD51C, RAD51D, RB1, RET, RUNX1, SDHA, SDHAF2, SDHB, SDHC, SDHD, SMAD4, SMARCA4, SMARCB1, SMARCE1, STK11, SUFU, TMEM127, TP53, TSC1, TSC2, VHL, and WT1 (sequencing and deletion/duplication); EGFR, HOXB13, KIT, MITF, PDGFRA, POLD1, and  POLE (sequencing only); EPCAM and GREM1 (deletion/duplication only).      MEDICAL HISTORY:  Past Medical History:  Diagnosis Date   Acute pulmonary embolism (HCC) 10/29/2023   Allergy    Back pain    Cancer (HCC) 2019   left breast cancer-last radiation Dec.2019   Diabetes mellitus without complication [facility])    Medical history non-contributory    Personal history of radiation therapy    Port-A-Cath in place - removed 12-11-2023 due to port-a-cath infection. 11/13/2023   Prediabetes    Tiredness    Vitamin D deficiency     SURGICAL HISTORY: Past Surgical History:  Procedure Laterality Date   BREAST BIOPSY Left 08/04/2018   x3   BREAST LUMPECTOMY Left    09-29-18   BREAST LUMPECTOMY WITH RADIOACTIVE SEED AND SENTINEL LYMPH NODE BIOPSY Left 09/29/2018   Procedure: LEFT BREAST LUMPECTOMY WITH RADIOACTIVE SEED AND SENTINEL LYMPH NODE BIOPSY;  Surgeon: [name], MD;  Location: [facility];  Service: General;  Laterality: Left;   COLONOSCOPY  2016   IR IMAGING GUIDED PORT INSERTION  11/03/2023   POLYPECTOMY     WISDOM TOOTH EXTRACTION      SOCIAL HISTORY: Social History   Socioeconomic History   Marital status: Married    Spouse name: Not on file   Number of children: Not on file   Years of education: Not on file   Highest education level: Not on file  Occupational History   Not on file  Tobacco Use   Smoking status: Former    Current packs/day: 0.00    Types: Cigarettes    Quit date: 01/2015    Years since quitting: 8.8   Smokeless tobacco: Never   Tobacco comments:    3-4 cigs a day   Vaping Use   Vaping status: Never Used  Substance and Sexual Activity   Alcohol use: Not Currently    Alcohol/week: 4.0 standard drinks of alcohol    Types: 4 Glasses of wine per week   Drug use: No   Sexual activity: Not on file  Other Topics Concern   Not on file  Social History Narrative   Not on file   Social Drivers of Health   Financial Resource  Strain: Not on file  Food Insecurity: No Food Insecurity (12/11/2023)   Hunger Vital Sign    Worried About Running Out of Food in the Last Year: Never true    Ran Out of Food in the Last Year: Never true  Transportation Needs: No Transportation Needs (12/11/2023)   PRAPARE - Administrator, Civil Service (Medical): No    Lack of Transportation (Non-Medical): No  Physical Activity: Not on file  Stress: Not on file  Social Connections: Not on file  Intimate Partner Violence: Not At Risk (12/11/2023)   Humiliation, Afraid, Rape, and Kick questionnaire    Fear of Current or Ex-Partner: No    Emotionally Abused: No    Physically Abused: No    Sexually Abused: No    FAMILY HISTORY: Family History  Problem Relation Age of Onset   Breast cancer Mother 62   Colon polyps Mother    Colon cancer Neg Hx    Rectal cancer Neg Hx    Stomach cancer Neg Hx    Esophageal cancer Neg Hx     ALLERGIES:  is allergic to irinotecan liposome.  MEDICATIONS:  Current Facility-Administered Medications  Medication Dose Route Frequency Provider Last Rate Last Admin   acetaminophen (TYLENOL) tablet 650 mg  650 mg Oral Q6H PRN Steffanie Rainwater, MD   650 mg at 12/12/23 0813   Or   acetaminophen (TYLENOL) suppository 650 mg  650 mg Rectal Q6H PRN Steffanie Rainwater, MD       apixaban Everlene Balls) tablet 5 mg  5 mg Oral BID Steffanie Rainwater, MD   5 mg at 12/12/23 0814   ceFEPIme (MAXIPIME) 2 g in sodium chloride 0.9 % 100 mL IVPB  2 g Intravenous Q8H Steffanie Rainwater, MD 200 mL/hr at 12/12/23 0818 2 g at 12/12/23 0818   HYDROmorphone (DILAUDID) injection 0.5 mg  0.5 mg Intravenous Q6H PRN Steffanie Rainwater, MD       losartan (COZAAR) tablet 25 mg  25 mg Oral Daily Steffanie Rainwater, MD   25 mg at 12/12/23 0814   ondansetron (ZOFRAN) tablet 4 mg  4 mg Oral Q6H PRN Steffanie Rainwater, MD       Or   ondansetron (ZOFRAN) injection 4 mg  4 mg Intravenous Q6H PRN Steffanie Rainwater, MD        senna-docusate (Senokot-S) tablet 1 tablet  1 tablet Oral QHS PRN Steffanie Rainwater, MD       vancomycin (VANCOREADY) IVPB 500 mg/100 mL  500 mg Intravenous Q12H Steffanie Rainwater, MD 100 mL/hr at 12/12/23 0558 500 mg at 12/12/23 1610   Facility-Administered Medications Ordered in Other Encounters  Medication Dose  Route Frequency Provider Last Rate Last Admin   dextrose 5 % solution   Intravenous Continuous [name], MD 10 mL/hr at 11/25/23 1412 New Bag at 11/25/23 1412   heparin lock flush 100 unit/mL  500 Units Intracatheter Once PRN [name], MD       sodium chloride flush (NS) 0.9 % injection 10 mL  10 mL Intracatheter PRN [name], MD        REVIEW OF SYSTEMS:   All relevant systems were reviewed with the patient and are negative.  PHYSICAL EXAMINATION: ECOG PERFORMANCE STATUS: 0 - Asymptomatic  Vitals:   12/12/23 0437 12/12/23 0908  BP: 106/67 101/68  Pulse: 87 (!) 102  Resp: 16 16  Temp: 99.1 F (37.3 C) 98.4 F (36.9 C)  SpO2: 96% 99%   Filed Weights   12/11/23 1808  Weight: 178 lb (80.7 kg)    GENERAL:alert, no distress and comfortable SKIN: Port site covered in dressing. EYES:  sclera clear LUNGS: clear to auscultation and normal breathing effort.  No wheeze or rales HEART: regular rate & rhythm and no murmurs ABDOMEN:abdomen soft, non-tender and nondistended Musculoskeletal:  no lower extremity edema NEURO: alert with fluent speech  LABORATORY DATA:  I have reviewed the data as listed Lab Results  Component Value Date   WBC 3.1 (L) 12/11/2023   HGB 12.6 12/11/2023   HCT 38.9 12/11/2023   MCV 77.6 (L) 12/11/2023   PLT 292 12/11/2023   Recent Labs    10/29/23 1234 10/30/23 0721 11/25/23 0833 12/09/23 0905 12/11/23 1554  NA 130*   < > 131* 136 133*  K 3.7   < > 3.4* 3.2* 3.8  CL 93*   < > 92* 103 101  CO2 23   < > 30 23 21*  GLUCOSE 125*   < > 121* 180* 115*  BUN 6*   < > 10 7* 12  CREATININE 0.61   < > 0.50 0.52 0.62  CALCIUM 8.8*   <  > 9.4 9.0 9.1  GFRNONAA >60   < > >60 >60 >60  PROT 7.9   < > 7.0 7.2 7.5  ALBUMIN 3.8   < > 3.5 3.5 3.7  AST 31   < > 118* 45* 76*  ALT 20   < > 91* 42 54*  ALKPHOS 216*   < > 337* 253* 241*  BILITOT 0.8   < > 0.6 0.4 0.8  BILIDIR 0.2  --   --   --   --   IBILI 0.6  --   --   --   --    < > = values in this interval not displayed.    RADIOGRAPHIC STUDIES: I have personally reviewed the radiological images as listed and agreed with the findings in the report. DG Chest Port 1 View Result Date: 12/11/2023 CLINICAL DATA:  Questionable sepsis - evaluate for abnormality. Suspected CT port-A-cath infection. No fever. EXAM: PORTABLE CHEST 1 VIEW COMPARISON:  10/26/2023. FINDINGS: Bilateral lung fields are clear. Bilateral costophrenic angles are clear. Stable cardio-mediastinal silhouette. No acute osseous abnormalities. The soft tissues are within normal limits. Right-sided CT Port-A-Cath is seen with its tip overlying the cavoatrial junction region. IMPRESSION: No active disease. Electronically Signed   By: Jules Schick M.D.   On: 12/11/2023 18:00

## 2023-12-12 NOTE — Progress Notes (Signed)
PROGRESS NOTE    Ashley Clark  WUJ:[id number] DOB: 19-Oct-1961 DOA: 12/11/2023 PCP: [name], PA  Subjective: Pt seen and examined. Dr. Cherly Hensen with oncology joined me with pt interview. Port-a-cath removed yesterday.  Pt states she feels fine. Wants to go home but understands the reason why IV abx need to be continued for 72 hours. No complaints.   Hospital Course: HPI: Ashley Clark is a 62 y.o. female with medical history significant of T2DM, HTN, left breast cancer in 2019 s/p left lumpectomy, and radiation, provoked PE on Eliquis, and a recently diagnosed pancreatic cancer with liver metastasis who presented from the cancer center for evaluation of infected port. Patient presented to her cancer center earlier today for cycle 3 of her chemotherapy. After the treatment, they planned for port de access and as soon as the needle was removed from her port site, pus was draining from the site. Patient also endorsed pain around the port site but denied any fevers, chills, nausea, vomiting, abdominal pain or chest pain. She was advised to present to the ED for port removal and IV antibiotics.   ED course: Initial vitals showed temp 98.4, RR 18, HR 104, BP 105/72, SpO2 100% on room air Labs show sodium 133, bicarb 21, K+ 3.8, creatinine 0.62, AST/ALT 76/54, alk phos 241, bili 0.8, WBC 3.1, Hgb 12.6, platelet 292, PT/INR 15.5/1.2, lactic acid 1.3 CXR with no active disease IR was consulted for port removal Patient was started on IV vancomycin and cefepime  Significant Events: Admitted 12/11/2023 for infected port-a-cath   Significant Labs: Admission labs sodium 133, bicarb 21, K+ 3.8, creatinine 0.62, AST/ALT 76/54, alk phos 241, bili 0.8, WBC 3.1, Hgb 12.6, platelet 292, PT/INR 15.5/1.2, lactic acid 1.3   Significant Imaging Studies: Admission CXR with no active disease   Antibiotic Therapy: Anti-infectives (From admission, onward)    Start     Dose/Rate Route Frequency  Ordered Stop   12/12/23 0600  vancomycin (VANCOREADY) IVPB 500 mg/100 mL        500 mg 100 mL/hr over 60 Minutes Intravenous Every 12 hours 12/11/23 1747     12/12/23 0000  ceFEPIme (MAXIPIME) 2 g in sodium chloride 0.9 % 100 mL IVPB        2 g 200 mL/hr over 30 Minutes Intravenous Every 8 hours 12/11/23 1638     12/11/23 1630  vancomycin (VANCOREADY) IVPB 750 mg/150 mL       Placed in "And" Linked Group   750 mg 150 mL/hr over 60 Minutes Intravenous  Once 12/11/23 1530 12/11/23 2019   12/11/23 1600  vancomycin (VANCOCIN) IVPB 1000 mg/200 mL premix       Placed in "And" Linked Group   1,000 mg 200 mL/hr over 60 Minutes Intravenous  Once 12/11/23 1530 12/11/23 1756   12/11/23 1530  ceFEPIme (MAXIPIME) 2 g in sodium chloride 0.9 % 100 mL IVPB        2 g 200 mL/hr over 30 Minutes Intravenous  Once 12/11/23 1527 12/11/23 1645       Procedures: 12-11-2023 port-a-cath removed  Consultants: IR    Assessment and Plan: * Infected venous access port, initial encounter 12-12-2023 s/p port-a-cath removal on 12-11-2023. Remains on IV cefepime/vanco pending blood cx.  Pancreatic cancer (HCC) 12-12-2023 just completed a round of chemo last week. Oncology following.  Metastasis to liver [facility]) 12-12-2023 followed by oncology  History of pulmonary embolism Stable. Continue eliquis bid.  Obesity (BMI 30-39.9) BMI 33.09  Diabetes mellitus (HCC)  12-12-2023 stable.       DVT prophylaxis:  apixaban (ELIQUIS) tablet 5 mg     Code Status: Full Code Family Communication: no family at bedside. Pt is decisional Disposition Plan: return home Reason for continuing need for hospitalization: remains on IV Abx.  Objective: Vitals:   12/11/23 2050 12/12/23 0047 12/12/23 0437 12/12/23 0908  BP: 120/74 113/77 106/67 101/68  Pulse:  81 87 (!) 102  Resp: 16 16 16 16   Temp: 99.2 F (37.3 C)  99.1 F (37.3 C) 98.4 F (36.9 C)  TempSrc: Oral  Oral Oral  SpO2: 98% 94% 96% 99%  Weight:       Height:        Intake/Output Summary (Last 24 hours) at 12/12/2023 1314 Last data filed at 12/12/2023 0900 Gross per 24 hour  Intake 683.75 ml  Output --  Net 683.75 ml   Filed Weights   12/11/23 1808  Weight: 80.7 kg    Examination:  Physical Exam Vitals and nursing note reviewed.  Constitutional:      General: She is not in acute distress.    Appearance: She is not toxic-appearing or diaphoretic.  HENT:     Head: Normocephalic and atraumatic.     Nose: Nose normal.  Eyes:     General: No scleral icterus. Cardiovascular:     Rate and Rhythm: Normal rate and regular rhythm.  Pulmonary:     Effort: Pulmonary effort is normal.     Breath sounds: Normal breath sounds.  Abdominal:     General: Bowel sounds are normal. There is no distension.     Palpations: Abdomen is soft.  Skin:    General: Skin is warm and dry.     Capillary Refill: Capillary refill takes less than 2 seconds.     Comments: Prior port-a-cath site covered in bandage. Tender to touch.  Neurological:     General: No focal deficit present.     Mental Status: She is alert and oriented to person, place, and time.     Data Reviewed: I have personally reviewed following labs and imaging studies  CBC: Recent Labs  Lab 12/09/23 0905 12/11/23 1554  WBC 3.2* 3.1*  NEUTROABS 1.1* 1.3*  HGB 11.9* 12.6  HCT 36.4 38.9  MCV 76.6* 77.6*  PLT 260 292   Basic Metabolic Panel: Recent Labs  Lab 12/09/23 0905 12/11/23 1554  NA 136 133*  K 3.2* 3.8  CL 103 101  CO2 23 21*  GLUCOSE 180* 115*  BUN 7* 12  CREATININE 0.52 0.62  CALCIUM 9.0 9.1   GFR: Estimated Creatinine Clearance: 71.9 mL/min (by C-G formula based on SCr of 0.62 mg/dL). Liver Function Tests: Recent Labs  Lab 12/09/23 0905 12/11/23 1554  AST 45* 76*  ALT 42 54*  ALKPHOS 253* 241*  BILITOT 0.4 0.8  PROT 7.2 7.5  ALBUMIN 3.5 3.7   Coagulation Profile: Recent Labs  Lab 12/11/23 1554  INR 1.2   Sepsis Labs: Recent Labs   Lab 12/11/23 1611  LATICACIDVEN 1.3    Recent Results (from the past 240 hours)  Blood Culture (routine x 2)     Status: None (Preliminary result)   Collection Time: 12/11/23  3:54 PM   Specimen: BLOOD  Result Value Ref Range Status   Specimen Description   Final    BLOOD BLOOD LEFT ARM Performed at [facility], [address] [place], [place] [address]    Special Requests   Final  BOTTLES DRAWN AEROBIC AND ANAEROBIC Blood Culture results may not be optimal due to an inadequate volume of blood received in culture bottles Performed at [facility], [address], [place], [place] [address]    Culture  Setup Time   Final    GRAM POSITIVE COCCI ANAEROBIC BOTTLE ONLY CRITICAL RESULT CALLED TO, READ BACK BY AND VERIFIED WITH: [facility] SYDNEY DAVIS [id number] AT 1212 BY EC Performed at [facility], [address] [place], [place] [address]    Culture GRAM POSITIVE COCCI  Final   Report Status PENDING  Incomplete  Blood Culture ID Panel (Reflexed)     Status: Abnormal   Collection Time: 12/11/23  3:54 PM  Result Value Ref Range Status   Enterococcus faecalis NOT DETECTED NOT DETECTED Final   Enterococcus Faecium NOT DETECTED NOT DETECTED Final   Listeria monocytogenes NOT DETECTED NOT DETECTED Final   Staphylococcus species DETECTED (A) NOT DETECTED Final    Comment: CRITICAL RESULT CALLED TO, READ BACK BY AND VERIFIED WITH: [facility] SYDNEY DAVIS [id number] AT 1212 BY EC    Staphylococcus aureus (BCID) NOT DETECTED NOT DETECTED Final   Staphylococcus epidermidis NOT DETECTED NOT DETECTED Final   Staphylococcus lugdunensis NOT DETECTED NOT DETECTED Final   Streptococcus species NOT DETECTED NOT DETECTED Final   Streptococcus agalactiae NOT DETECTED NOT DETECTED Final   Streptococcus pneumoniae NOT DETECTED NOT DETECTED Final   Streptococcus pyogenes NOT DETECTED NOT DETECTED Final   A.calcoaceticus-baumannii NOT DETECTED NOT DETECTED  Final   Bacteroides fragilis NOT DETECTED NOT DETECTED Final   Enterobacterales NOT DETECTED NOT DETECTED Final   Enterobacter cloacae complex NOT DETECTED NOT DETECTED Final   Escherichia coli NOT DETECTED NOT DETECTED Final   Klebsiella aerogenes NOT DETECTED NOT DETECTED Final   Klebsiella oxytoca NOT DETECTED NOT DETECTED Final   Klebsiella pneumoniae NOT DETECTED NOT DETECTED Final   Proteus species NOT DETECTED NOT DETECTED Final   Salmonella species NOT DETECTED NOT DETECTED Final   Serratia marcescens NOT DETECTED NOT DETECTED Final   Haemophilus influenzae NOT DETECTED NOT DETECTED Final   Neisseria meningitidis NOT DETECTED NOT DETECTED Final   Pseudomonas aeruginosa NOT DETECTED NOT DETECTED Final   Stenotrophomonas maltophilia NOT DETECTED NOT DETECTED Final   Candida albicans NOT DETECTED NOT DETECTED Final   Candida auris NOT DETECTED NOT DETECTED Final   Candida glabrata NOT DETECTED NOT DETECTED Final   Candida krusei NOT DETECTED NOT DETECTED Final   Candida parapsilosis NOT DETECTED NOT DETECTED Final   Candida tropicalis NOT DETECTED NOT DETECTED Final   Cryptococcus neoformans/gattii NOT DETECTED NOT DETECTED Final    Comment: Performed at [facility], [address], [place], [place] [address]     Radiology Studies: IR REMOVAL TUN ACCESS W/ PORT W/O FL MOD SED Result Date: 12/12/2023 CLINICAL DATA:  Metastatic pancreatic carcinoma, status post port catheter placement 11/03/2023 by Dr. Miles Costain. Purulent discharge was noted around the needle access site today. On exam , the port is tender with swelling and redness over the septum. Patient is afebrile. EXAM: EXAM TUNNELED PORT CATHETER REMOVAL TECHNIQUE: Overlying skin prepped with chlorhexidine, draped in usual sterile fashion, infiltrated locally with 1% lidocaine. A small incision was made over the scar from previous placement. Moderate amount of cloudy blood tinged fluid spontaneously discharged from around the  port body out the incision. The port was dissected free from the underlying soft tissues and removed intact. Hemostasis was achieved. The port pocket was packed with  iodoform gauze and a sterile dressing applied. The patient tolerated the procedure well. COMPLICATIONS: COMPLICATIONS None immediate IMPRESSION: 1. Port pocket infection. Patient will follow-up early next week for dressing change. Electronically Signed   By: Corlis Leak M.D.   On: 12/12/2023 12:54   DG Chest Port 1 View Result Date: 12/11/2023 CLINICAL DATA:  Questionable sepsis - evaluate for abnormality. Suspected CT port-A-cath infection. No fever. EXAM: PORTABLE CHEST 1 VIEW COMPARISON:  10/26/2023. FINDINGS: Bilateral lung fields are clear. Bilateral costophrenic angles are clear. Stable cardio-mediastinal silhouette. No acute osseous abnormalities. The soft tissues are within normal limits. Right-sided CT Port-A-Cath is seen with its tip overlying the cavoatrial junction region. IMPRESSION: No active disease. Electronically Signed   By: Jules Schick M.D.   On: 12/11/2023 18:00    Scheduled Meds:  apixaban  5 mg Oral BID   [START ON 12/13/2023] loratadine  10 mg Oral Daily   losartan  25 mg Oral Daily   sodium chloride flush  3 mL Intravenous Q12H   Continuous Infusions:  ceFEPime (MAXIPIME) IV 2 g (12/12/23 0818)   vancomycin 500 mg (12/12/23 0558)     LOS: 1 day   Time spent: 40 minutes  Carollee Herter, DO  [facility]  12/12/2023, 1:14 PM

## 2023-12-12 NOTE — Assessment & Plan Note (Signed)
Stable. Continue eliquis bid.

## 2023-12-12 NOTE — Hospital Course (Signed)
HPI: Ashley Clark is a 62 y.o. female with medical history significant of T2DM, HTN, left breast cancer in 2019 s/p left lumpectomy, and radiation, provoked PE on Eliquis, and a recently diagnosed pancreatic cancer with liver metastasis who presented from the cancer center for evaluation of infected port. Patient presented to her cancer center earlier today for cycle 3 of her chemotherapy. After the treatment, they planned for port de access and as soon as the needle was removed from her port site, pus was draining from the site. Patient also endorsed pain around the port site but denied any fevers, chills, nausea, vomiting, abdominal pain or chest pain. She was advised to present to the ED for port removal and IV antibiotics.   ED course: Initial vitals showed temp 98.4, RR 18, HR 104, BP 105/72, SpO2 100% on room air Labs show sodium 133, bicarb 21, K+ 3.8, creatinine 0.62, AST/ALT 76/54, alk phos 241, bili 0.8, WBC 3.1, Hgb 12.6, platelet 292, PT/INR 15.5/1.2, lactic acid 1.3 CXR with no active disease IR was consulted for port removal Patient was started on IV vancomycin and cefepime  Significant Events: Admitted 12/11/2023 for infected port-a-cath   Significant Labs: Admission labs sodium 133, bicarb 21, K+ 3.8, creatinine 0.62, AST/ALT 76/54, alk phos 241, bili 0.8, WBC 3.1, Hgb 12.6, platelet 292, PT/INR 15.5/1.2, lactic acid 1.3   Significant Imaging Studies: Admission CXR with no active disease   Antibiotic Therapy: Anti-infectives (From admission, onward)    Start     Dose/Rate Route Frequency Ordered Stop   12/12/23 0600  vancomycin (VANCOREADY) IVPB 500 mg/100 mL        500 mg 100 mL/hr over 60 Minutes Intravenous Every 12 hours 12/11/23 1747     12/12/23 0000  ceFEPIme (MAXIPIME) 2 g in sodium chloride 0.9 % 100 mL IVPB        2 g 200 mL/hr over 30 Minutes Intravenous Every 8 hours 12/11/23 1638     12/11/23 1630  vancomycin (VANCOREADY) IVPB 750 mg/150 mL       Placed  in "And" Linked Group   750 mg 150 mL/hr over 60 Minutes Intravenous  Once 12/11/23 1530 12/11/23 2019   12/11/23 1600  vancomycin (VANCOCIN) IVPB 1000 mg/200 mL premix       Placed in "And" Linked Group   1,000 mg 200 mL/hr over 60 Minutes Intravenous  Once 12/11/23 1530 12/11/23 1756   12/11/23 1530  ceFEPIme (MAXIPIME) 2 g in sodium chloride 0.9 % 100 mL IVPB        2 g 200 mL/hr over 30 Minutes Intravenous  Once 12/11/23 1527 12/11/23 1645       Procedures: 12-11-2023 port-a-cath removed  Consultants: IR

## 2023-12-12 NOTE — Assessment & Plan Note (Signed)
12-13-2023 just completed a round of chemo last week. Oncology following.

## 2023-12-12 NOTE — Subjective & Objective (Signed)
Pt seen and examined. Dr. Cherly Hensen with oncology joined me with pt interview. Port-a-cath removed yesterday.  Pt states she feels fine. Wants to go home but understands the reason why IV abx need to be continued for 72 hours. No complaints.

## 2023-12-12 NOTE — Assessment & Plan Note (Signed)
12-13-2023 followed by oncology

## 2023-12-12 NOTE — Assessment & Plan Note (Signed)
BMI 33.09

## 2023-12-13 DIAGNOSIS — Z86711 Personal history of pulmonary embolism: Secondary | ICD-10-CM | POA: Diagnosis not present

## 2023-12-13 DIAGNOSIS — R7881 Bacteremia: Secondary | ICD-10-CM | POA: Diagnosis not present

## 2023-12-13 DIAGNOSIS — T80219A Unspecified infection due to central venous catheter, initial encounter: Secondary | ICD-10-CM | POA: Diagnosis not present

## 2023-12-13 DIAGNOSIS — C251 Malignant neoplasm of body of pancreas: Secondary | ICD-10-CM | POA: Diagnosis not present

## 2023-12-13 DIAGNOSIS — B958 Unspecified staphylococcus as the cause of diseases classified elsewhere: Secondary | ICD-10-CM

## 2023-12-13 DIAGNOSIS — C787 Secondary malignant neoplasm of liver and intrahepatic bile duct: Secondary | ICD-10-CM | POA: Diagnosis not present

## 2023-12-13 DIAGNOSIS — C259 Malignant neoplasm of pancreas, unspecified: Secondary | ICD-10-CM | POA: Diagnosis not present

## 2023-12-13 MED ORDER — SODIUM CHLORIDE 0.9% FLUSH
3.0000 mL | Freq: Two times a day (BID) | INTRAVENOUS | Status: DC
  Administered 2023-12-13 – 2023-12-14 (×2): 3 mL via INTRAVENOUS

## 2023-12-13 NOTE — Progress Notes (Signed)
Ashley Clark   DOB:July 12, 1961   QI#:[id number]    ASSESSMENT & PLAN:  Ashley Clark 62 y.o. female with medical history significant of T2DM, HTN, left breast cancer in 2019 s/p left lumpectomy, and radiation, provoked PE on Eliquis, and a recently diagnosed pancreatic cancer with liver metastasis who was admitted for port infection.  Patient was in the infusion room yesterday for de access.  Pain locally at the port site starting on day 2.  She denies any fever, chills.   Discussed with patient, remain inpatient is critical at this time until results available from blood cultures to determine final antibiotics.  Afebrile overnight and feeling well.  Will need to repeat blood cultures   Infected Mediport Port was removed on 12/13 Currently on empiric antibiotics with IV vancomycin and cefepime.  Staphylococcus reported but not finalized. Continue follow-up with blood cultures   Bacteremia Staphylococcus reported but not finalized.  Monitor repeat blood culture Continue empiric antibiotics Appreciate internal medicine management  Metastatic pancreatic cancer  Completed cycle 3 of NALIRIFOX. Received Udenyca on 12/13 Will continue daily claritin daily   Neutropenia Mild, received Udenyca on 12/13 Monitor blood counts while inpatient   History of PE Diagnosis in November the setting of malignancy Continue apixaban 5 mg twice daily   Other medical conditions management per primary.   Discharge planning Follow-up blood cultures.  Continue empiric antibiotics.   All questions were answered.   Discharge planning Pending culture results before determination of final antibiotics  All questions were answered.   Thank you for the consult. Will communicated with primary oncologist tomorrow.  Melven Sartorius, MD 12/13/2023 11:28 AM  Subjective:  Ashley Clark reports feeling well overall.  No fever, rigors or chills.  Mild pain over the port site.  She denies any discharge.  No  nausea, vomiting, abdominal pain or bowel changes.  Tolerating oral intake.  No difficulty with urination.  Objective:  Vitals:   12/12/23 2200 12/13/23 0611  BP: 109/77 (!) 122/93  Pulse: 91 80  Resp: 16 18  Temp: 99 F (37.2 C) 98.2 F (36.8 C)  SpO2: 95% 98%     Intake/Output Summary (Last 24 hours) at 12/13/2023 1128 Last data filed at 12/12/2023 1722 Gross per 24 hour  Intake 646.78 ml  Output --  Net 646.78 ml    GENERAL: alert, no distress and comfortable SKIN: skin color normal.  Dressing over the right chest wall previous port site EYES: normal, sclera clear NECK: supple.  No erythema LUNGS: No wheeze, rales and clear to auscultation bilaterally with normal breathing effort.  HEART: regular rate & rhythm  ABDOMEN: abdomen soft, non-tender and non-distended Musculoskeletal: no lower extremity edema NEURO: alert with fluent speech   Labs:  Recent Labs    10/29/23 1234 10/30/23 0721 11/25/23 0833 12/09/23 0905 12/11/23 1554  NA 130*   < > 131* 136 133*  K 3.7   < > 3.4* 3.2* 3.8  CL 93*   < > 92* 103 101  CO2 23   < > 30 23 21*  GLUCOSE 125*   < > 121* 180* 115*  BUN 6*   < > 10 7* 12  CREATININE 0.61   < > 0.50 0.52 0.62  CALCIUM 8.8*   < > 9.4 9.0 9.1  GFRNONAA >60   < > >60 >60 >60  PROT 7.9   < > 7.0 7.2 7.5  ALBUMIN 3.8   < > 3.5 3.5 3.7  AST 31   < >  118* 45* 76*  ALT 20   < > 91* 42 54*  ALKPHOS 216*   < > 337* 253* 241*  BILITOT 0.8   < > 0.6 0.4 0.8  BILIDIR 0.2  --   --   --   --   IBILI 0.6  --   --   --   --    < > = values in this interval not displayed.    Studies:  IR REMOVAL TUN ACCESS W/ PORT W/O FL MOD SED Result Date: 12/12/2023 CLINICAL DATA:  Metastatic pancreatic carcinoma, status post port catheter placement 11/03/2023 by Dr. Miles Costain. Purulent discharge was noted around the needle access site today. On exam , the port is tender with swelling and redness over the septum. Patient is afebrile. EXAM: EXAM TUNNELED PORT CATHETER  REMOVAL TECHNIQUE: Overlying skin prepped with chlorhexidine, draped in usual sterile fashion, infiltrated locally with 1% lidocaine. A small incision was made over the scar from previous placement. Moderate amount of cloudy blood tinged fluid spontaneously discharged from around the port body out the incision. The port was dissected free from the underlying soft tissues and removed intact. Hemostasis was achieved. The port pocket was packed with iodoform gauze and a sterile dressing applied. The patient tolerated the procedure well. COMPLICATIONS: COMPLICATIONS None immediate IMPRESSION: 1. Port pocket infection. Patient will follow-up early next week for dressing change. Electronically Signed   By: Corlis Leak M.D.   On: 12/12/2023 12:54   DG Chest Port 1 View Result Date: 12/11/2023 CLINICAL DATA:  Questionable sepsis - evaluate for abnormality. Suspected CT port-A-cath infection. No fever. EXAM: PORTABLE CHEST 1 VIEW COMPARISON:  10/26/2023. FINDINGS: Bilateral lung fields are clear. Bilateral costophrenic angles are clear. Stable cardio-mediastinal silhouette. No acute osseous abnormalities. The soft tissues are within normal limits. Right-sided CT Port-A-Cath is seen with its tip overlying the cavoatrial junction region. IMPRESSION: No active disease. Electronically Signed   By: Jules Schick M.D.   On: 12/11/2023 18:00

## 2023-12-13 NOTE — Plan of Care (Signed)

## 2023-12-13 NOTE — Assessment & Plan Note (Addendum)
12-13-2023 blood cx growing staph species, but NOT staph aureus.  Will get echo. Awaiting final ID. Will need ID consult when final ID come back.

## 2023-12-13 NOTE — Progress Notes (Signed)
PROGRESS NOTE    ANGELL BELVIN  OZD:[id number] DOB: March 10, 1961 DOA: 12/11/2023 PCP: [name], PA  Subjective: Pt seen and examined.   Blood cx growing staph species. No final ID yet. Pt otherwise feeling well. C/o of HA. Has not had coffee in 3 days. I went and got her a cup of coffee.   Hospital Course: HPI: Ashley Clark is a 62 y.o. female with medical history significant of T2DM, HTN, left breast cancer in 2019 s/p left lumpectomy, and radiation, provoked PE on Eliquis, and a recently diagnosed pancreatic cancer with liver metastasis who presented from the cancer center for evaluation of infected port. Patient presented to her cancer center earlier today for cycle 3 of her chemotherapy. After the treatment, they planned for port de access and as soon as the needle was removed from her port site, pus was draining from the site. Patient also endorsed pain around the port site but denied any fevers, chills, nausea, vomiting, abdominal pain or chest pain. She was advised to present to the ED for port removal and IV antibiotics.   ED course: Initial vitals showed temp 98.4, RR 18, HR 104, BP 105/72, SpO2 100% on room air Labs show sodium 133, bicarb 21, K+ 3.8, creatinine 0.62, AST/ALT 76/54, alk phos 241, bili 0.8, WBC 3.1, Hgb 12.6, platelet 292, PT/INR 15.5/1.2, lactic acid 1.3 CXR with no active disease IR was consulted for port removal Patient was started on IV vancomycin and cefepime  Significant Events: Admitted 12/11/2023 for infected port-a-cath   Significant Labs: Admission labs sodium 133, bicarb 21, K+ 3.8, creatinine 0.62, AST/ALT 76/54, alk phos 241, bili 0.8, WBC 3.1, Hgb 12.6, platelet 292, PT/INR 15.5/1.2, lactic acid 1.3   Significant Imaging Studies: Admission CXR with no active disease   Antibiotic Therapy: Anti-infectives (From admission, onward)    Start     Dose/Rate Route Frequency Ordered Stop   12/12/23 0600  vancomycin (VANCOREADY) IVPB 500  mg/100 mL        500 mg 100 mL/hr over 60 Minutes Intravenous Every 12 hours 12/11/23 1747     12/12/23 0000  ceFEPIme (MAXIPIME) 2 g in sodium chloride 0.9 % 100 mL IVPB        2 g 200 mL/hr over 30 Minutes Intravenous Every 8 hours 12/11/23 1638     12/11/23 1630  vancomycin (VANCOREADY) IVPB 750 mg/150 mL       Placed in "And" Linked Group   750 mg 150 mL/hr over 60 Minutes Intravenous  Once 12/11/23 1530 12/11/23 2019   12/11/23 1600  vancomycin (VANCOCIN) IVPB 1000 mg/200 mL premix       Placed in "And" Linked Group   1,000 mg 200 mL/hr over 60 Minutes Intravenous  Once 12/11/23 1530 12/11/23 1756   12/11/23 1530  ceFEPIme (MAXIPIME) 2 g in sodium chloride 0.9 % 100 mL IVPB        2 g 200 mL/hr over 30 Minutes Intravenous  Once 12/11/23 1527 12/11/23 1645       Procedures: 12-11-2023 port-a-cath removed  Consultants: IR    Assessment and Plan: * Infected venous access port, initial encounter 12-12-2023 s/p port-a-cath removal on 12-11-2023. Remains on IV cefepime/vanco pending blood cx.  12-13-2023 blood cx growing staph species. Remains on IV cefepime/vanco.  Bacteremia due to Staphylococcus 12-13-2023 blood cx growing staph species, but NOT staph aureus.  Will get echo. Awaiting final ID. Will need ID consult when final ID come back.  Pancreatic cancer (HCC) 12-13-2023  just completed a round of chemo last week. Oncology following.  Metastasis to liver [facility]) 12-13-2023 followed by oncology  History of pulmonary embolism Stable. Continue eliquis bid.  Obesity (BMI 30-39.9) BMI 33.09  Diabetes mellitus (HCC) 12-13-2023 stable.   DVT prophylaxis:  apixaban (ELIQUIS) tablet 5 mg     Code Status: Full Code Family Communication: no family at bedside. Pt is decisional Disposition Plan: return home Reason for continuing need for hospitalization: remains on IV Abx.  Objective: Vitals:   12/12/23 0908 12/12/23 1557 12/12/23 2200 12/13/23 0611  BP: 101/68  115/77 109/77 (!) 122/93  Pulse: (!) 102 87 91 80  Resp: 16 18 16 18   Temp: 98.4 F (36.9 C) 99.2 F (37.3 C) 99 F (37.2 C) 98.2 F (36.8 C)  TempSrc: Oral Oral Oral Oral  SpO2: 99% 100% 95% 98%  Weight:      Height:        Intake/Output Summary (Last 24 hours) at 12/13/2023 1338 Last data filed at 12/12/2023 1722 Gross per 24 hour  Intake 406.78 ml  Output --  Net 406.78 ml   Filed Weights   12/11/23 1808  Weight: 80.7 kg    Examination:  Physical Exam Vitals and nursing note reviewed.  Constitutional:      General: She is not in acute distress.    Appearance: She is not toxic-appearing or diaphoretic.  HENT:     Head: Normocephalic and atraumatic.     Nose: Nose normal.  Eyes:     General: No scleral icterus. Pulmonary:     Effort: Pulmonary effort is normal. No respiratory distress.  Abdominal:     General: There is no distension.  Skin:    Capillary Refill: Capillary refill takes less than 2 seconds.  Neurological:     Mental Status: She is alert and oriented to person, place, and time.     Data Reviewed: I have personally reviewed following labs and imaging studies  CBC: Recent Labs  Lab 12/09/23 0905 12/11/23 1554  WBC 3.2* 3.1*  NEUTROABS 1.1* 1.3*  HGB 11.9* 12.6  HCT 36.4 38.9  MCV 76.6* 77.6*  PLT 260 292   Basic Metabolic Panel: Recent Labs  Lab 12/09/23 0905 12/11/23 1554  NA 136 133*  K 3.2* 3.8  CL 103 101  CO2 23 21*  GLUCOSE 180* 115*  BUN 7* 12  CREATININE 0.52 0.62  CALCIUM 9.0 9.1   GFR: Estimated Creatinine Clearance: 71.9 mL/min (by C-G formula based on SCr of 0.62 mg/dL). Liver Function Tests: Recent Labs  Lab 12/09/23 0905 12/11/23 1554  AST 45* 76*  ALT 42 54*  ALKPHOS 253* 241*  BILITOT 0.4 0.8  PROT 7.2 7.5  ALBUMIN 3.5 3.7   Coagulation Profile: Recent Labs  Lab 12/11/23 1554  INR 1.2   Sepsis Labs: Recent Labs  Lab 12/11/23 1611  LATICACIDVEN 1.3    Recent Results (from the past 240  hours)  Blood Culture (routine x 2)     Status: None (Preliminary result)   Collection Time: 12/11/23  3:54 PM   Specimen: BLOOD  Result Value Ref Range Status   Specimen Description   Final    BLOOD BLOOD LEFT ARM Performed at [facility], [address], [place], [place] [address]    Special Requests   Final    BOTTLES DRAWN AEROBIC AND ANAEROBIC Blood Culture results may not be optimal due to an inadequate volume of blood received in culture bottles Performed at [facility], [address] [place], [place] [address]    Culture  Setup Time   Final    GRAM POSITIVE COCCI ANAEROBIC BOTTLE ONLY CRITICAL RESULT CALLED TO, READ BACK BY AND VERIFIED WITH: PHARMD SYDNEY DAVIS [id number] AT 1212 BY EC    Culture   Final    GRAM POSITIVE COCCI IDENTIFICATION TO FOLLOW Performed at [facility], [address] [place], [place] [address]    Report Status PENDING  Incomplete  Blood Culture (routine x 2)     Status: None (Preliminary result)   Collection Time: 12/11/23  3:54 PM   Specimen: BLOOD  Result Value Ref Range Status   Specimen Description   Final    BLOOD LEFT ANTECUBITAL Performed at [facility], [address], [place], [place] [address]    Special Requests   Final    BOTTLES DRAWN AEROBIC AND ANAEROBIC Blood Culture results may not be optimal due to an inadequate volume of blood received in culture bottles Performed at [facility], [address] [place], [place] [address]    Culture   Final    NO GROWTH 2 DAYS Performed at [facility], [address], [place], [place] [address]    Report Status PENDING  Incomplete  Blood Culture ID Panel (Reflexed)     Status: Abnormal   Collection Time: 12/11/23  3:54 PM  Result Value Ref Range Status   Enterococcus faecalis NOT DETECTED NOT DETECTED Final   Enterococcus Faecium NOT DETECTED NOT DETECTED Final   Listeria monocytogenes NOT  DETECTED NOT DETECTED Final   Staphylococcus species DETECTED (A) NOT DETECTED Final    Comment: CRITICAL RESULT CALLED TO, READ BACK BY AND VERIFIED WITH: PHARMD SYDNEY DAVIS [id number] AT 1212 BY EC    Staphylococcus aureus (BCID) NOT DETECTED NOT DETECTED Final   Staphylococcus epidermidis NOT DETECTED NOT DETECTED Final   Staphylococcus lugdunensis NOT DETECTED NOT DETECTED Final   Streptococcus species NOT DETECTED NOT DETECTED Final   Streptococcus agalactiae NOT DETECTED NOT DETECTED Final   Streptococcus pneumoniae NOT DETECTED NOT DETECTED Final   Streptococcus pyogenes NOT DETECTED NOT DETECTED Final   A.calcoaceticus-baumannii NOT DETECTED NOT DETECTED Final   Bacteroides fragilis NOT DETECTED NOT DETECTED Final   Enterobacterales NOT DETECTED NOT DETECTED Final   Enterobacter cloacae complex NOT DETECTED NOT DETECTED Final   Escherichia coli NOT DETECTED NOT DETECTED Final   Klebsiella aerogenes NOT DETECTED NOT DETECTED Final   Klebsiella oxytoca NOT DETECTED NOT DETECTED Final   Klebsiella pneumoniae NOT DETECTED NOT DETECTED Final   Proteus species NOT DETECTED NOT DETECTED Final   Salmonella species NOT DETECTED NOT DETECTED Final   Serratia marcescens NOT DETECTED NOT DETECTED Final   Haemophilus influenzae NOT DETECTED NOT DETECTED Final   Neisseria meningitidis NOT DETECTED NOT DETECTED Final   Pseudomonas aeruginosa NOT DETECTED NOT DETECTED Final   Stenotrophomonas maltophilia NOT DETECTED NOT DETECTED Final   Candida albicans NOT DETECTED NOT DETECTED Final   Candida auris NOT DETECTED NOT DETECTED Final   Candida glabrata NOT DETECTED NOT DETECTED Final   Candida krusei NOT DETECTED NOT DETECTED Final   Candida parapsilosis NOT DETECTED NOT DETECTED Final   Candida tropicalis NOT DETECTED NOT DETECTED Final   Cryptococcus neoformans/gattii NOT DETECTED NOT DETECTED Final    Comment: Performed at [facility], [address], [place], [place] [address]      Radiology Studies: IR REMOVAL TUN ACCESS W/ PORT  W/O FL MOD SED Result Date: 12/12/2023 CLINICAL DATA:  Metastatic pancreatic carcinoma, status post port catheter placement 11/03/2023 by Dr. Miles Costain. Purulent discharge was noted around the needle access site today. On exam , the port is tender with swelling and redness over the septum. Patient is afebrile. EXAM: EXAM TUNNELED PORT CATHETER REMOVAL TECHNIQUE: Overlying skin prepped with chlorhexidine, draped in usual sterile fashion, infiltrated locally with 1% lidocaine. A small incision was made over the scar from previous placement. Moderate amount of cloudy blood tinged fluid spontaneously discharged from around the port body out the incision. The port was dissected free from the underlying soft tissues and removed intact. Hemostasis was achieved. The port pocket was packed with iodoform gauze and a sterile dressing applied. The patient tolerated the procedure well. COMPLICATIONS: COMPLICATIONS None immediate IMPRESSION: 1. Port pocket infection. Patient will follow-up early next week for dressing change. Electronically Signed   By: Corlis Leak M.D.   On: 12/12/2023 12:54   DG Chest Port 1 View Result Date: 12/11/2023 CLINICAL DATA:  Questionable sepsis - evaluate for abnormality. Suspected CT port-A-cath infection. No fever. EXAM: PORTABLE CHEST 1 VIEW COMPARISON:  10/26/2023. FINDINGS: Bilateral lung fields are clear. Bilateral costophrenic angles are clear. Stable cardio-mediastinal silhouette. No acute osseous abnormalities. The soft tissues are within normal limits. Right-sided CT Port-A-Cath is seen with its tip overlying the cavoatrial junction region. IMPRESSION: No active disease. Electronically Signed   By: Jules Schick M.D.   On: 12/11/2023 18:00    Scheduled Meds:  apixaban  5 mg Oral BID   loratadine  10 mg Oral Daily   losartan  25 mg Oral Daily   sodium chloride flush  3 mL Intravenous Q12H   sodium chloride flush  3 mL  Intravenous Q12H   Continuous Infusions:  ceFEPime (MAXIPIME) IV 2 g (12/13/23 0913)   vancomycin 500 mg (12/13/23 0624)     LOS: 2 days   Time spent: 40 minutes  Carollee Herter, DO  [facility]  12/13/2023, 1:38 PM

## 2023-12-14 ENCOUNTER — Other Ambulatory Visit (HOSPITAL_COMMUNITY): Payer: Self-pay

## 2023-12-14 ENCOUNTER — Inpatient Hospital Stay (HOSPITAL_COMMUNITY): Payer: 59

## 2023-12-14 DIAGNOSIS — T80219A Unspecified infection due to central venous catheter, initial encounter: Secondary | ICD-10-CM | POA: Diagnosis not present

## 2023-12-14 DIAGNOSIS — E1169 Type 2 diabetes mellitus with other specified complication: Secondary | ICD-10-CM | POA: Diagnosis not present

## 2023-12-14 DIAGNOSIS — R7881 Bacteremia: Secondary | ICD-10-CM | POA: Diagnosis not present

## 2023-12-14 DIAGNOSIS — C251 Malignant neoplasm of body of pancreas: Secondary | ICD-10-CM | POA: Diagnosis not present

## 2023-12-14 LAB — CBC WITH DIFFERENTIAL/PLATELET
Abs Immature Granulocytes: 0.14 10*3/uL — ABNORMAL HIGH (ref 0.00–0.07)
Basophils Absolute: 0 10*3/uL (ref 0.0–0.1)
Basophils Relative: 0 %
Eosinophils Absolute: 0.1 10*3/uL (ref 0.0–0.5)
Eosinophils Relative: 0 %
HCT: 35.5 % — ABNORMAL LOW (ref 36.0–46.0)
Hemoglobin: 11.3 g/dL — ABNORMAL LOW (ref 12.0–15.0)
Immature Granulocytes: 1 %
Lymphocytes Relative: 11 %
Lymphs Abs: 1.3 10*3/uL (ref 0.7–4.0)
MCH: 24.9 pg — ABNORMAL LOW (ref 26.0–34.0)
MCHC: 31.8 g/dL (ref 30.0–36.0)
MCV: 78.2 fL — ABNORMAL LOW (ref 80.0–100.0)
Monocytes Absolute: 0.4 10*3/uL (ref 0.1–1.0)
Monocytes Relative: 4 %
Neutro Abs: 9.3 10*3/uL — ABNORMAL HIGH (ref 1.7–7.7)
Neutrophils Relative %: 84 %
Platelets: 227 10*3/uL (ref 150–400)
RBC: 4.54 MIL/uL (ref 3.87–5.11)
RDW: 17 % — ABNORMAL HIGH (ref 11.5–15.5)
WBC: 11.2 10*3/uL — ABNORMAL HIGH (ref 4.0–10.5)
nRBC: 0 % (ref 0.0–0.2)

## 2023-12-14 LAB — ECHOCARDIOGRAM COMPLETE
AR max vel: 1.55 cm2
AV Area VTI: 1.94 cm2
AV Area mean vel: 1.35 cm2
AV Mean grad: 4 mm[Hg]
AV Peak grad: 8.6 mm[Hg]
Ao pk vel: 1.47 m/s
Area-P 1/2: 2.91 cm2
Calc EF: 50.4 %
Height: 61.5 in
MV VTI: 1.91 cm2
S' Lateral: 2.6 cm
Single Plane A2C EF: 49.9 %
Single Plane A4C EF: 50.2 %
Weight: 2848 [oz_av]

## 2023-12-14 LAB — CULTURE, BLOOD (ROUTINE X 2)

## 2023-12-14 LAB — COMPREHENSIVE METABOLIC PANEL
ALT: 31 U/L (ref 0–44)
AST: 30 U/L (ref 15–41)
Albumin: 3.1 g/dL — ABNORMAL LOW (ref 3.5–5.0)
Alkaline Phosphatase: 238 U/L — ABNORMAL HIGH (ref 38–126)
Anion gap: 8 (ref 5–15)
BUN: 8 mg/dL (ref 8–23)
CO2: 22 mmol/L (ref 22–32)
Calcium: 8.8 mg/dL — ABNORMAL LOW (ref 8.9–10.3)
Chloride: 102 mmol/L (ref 98–111)
Creatinine, Ser: 0.45 mg/dL (ref 0.44–1.00)
GFR, Estimated: >60 mL/min (ref >60)
Glucose, Bld: 133 mg/dL — ABNORMAL HIGH (ref 70–99)
Potassium: 3 mmol/L — ABNORMAL LOW (ref 3.5–5.1)
Sodium: 132 mmol/L — ABNORMAL LOW (ref 135–145)
Total Bilirubin: 0.6 mg/dL (ref <1.2)
Total Protein: 6.6 g/dL (ref 6.5–8.1)

## 2023-12-14 LAB — CREATININE, SERUM
Creatinine, Ser: 0.43 mg/dL — ABNORMAL LOW (ref 0.44–1.00)
GFR, Estimated: >60 mL/min (ref >60)

## 2023-12-14 LAB — MAGNESIUM: Magnesium: 2.2 mg/dL (ref 1.7–2.4)

## 2023-12-14 MED ORDER — DOXYCYCLINE HYCLATE 100 MG PO TABS: 100.0000 mg | ORAL_TABLET | Freq: Two times a day (BID) | ORAL | Status: DC

## 2023-12-14 MED ORDER — CEFADROXIL 500 MG PO CAPS
1000.0000 mg | ORAL_CAPSULE | Freq: Two times a day (BID) | ORAL | 0 refills | Status: AC
  Filled 2023-12-14: qty 28, 7d supply, fill #0

## 2023-12-14 MED ORDER — POTASSIUM CHLORIDE CRYS ER 20 MEQ PO TBCR
40.0000 meq | EXTENDED_RELEASE_TABLET | Freq: Once | ORAL | Status: AC
  Administered 2023-12-14: 40 meq via ORAL
  Filled 2023-12-14: qty 2

## 2023-12-14 MED ORDER — POTASSIUM CHLORIDE CRYS ER 20 MEQ PO TBCR
20.0000 meq | EXTENDED_RELEASE_TABLET | Freq: Every day | ORAL | 0 refills | Status: DC
  Filled 2023-12-14: qty 2, 2d supply, fill #0

## 2023-12-14 MED ORDER — POTASSIUM CHLORIDE 10 MEQ/100ML IV SOLN
10.0000 meq | Freq: Once | INTRAVENOUS | Status: AC
  Administered 2023-12-14: 10 meq via INTRAVENOUS
  Filled 2023-12-14: qty 100

## 2023-12-14 MED ORDER — DOXYCYCLINE HYCLATE 100 MG PO TABS
100.0000 mg | ORAL_TABLET | Freq: Two times a day (BID) | ORAL | 0 refills | Status: AC
  Filled 2023-12-14: qty 14, 7d supply, fill #0

## 2023-12-14 NOTE — Progress Notes (Signed)
Pharmacy Antibiotic Note  Ashley Clark is a 62 y.o. female admitted on 12/11/2023 with infected venous access port/neutropenic.  Pharmacy has been consulted for vancomycin & cefepime dosing. 12/14/2023 Day #4 cefepime/vancomycin for infected port which was removed 12/11/23 SCr 0.43. Afebrile 1/4 blood culture bottles with Staph capitis which could be contaminant  Plan:  Continue Cefepime 2 gm IV q8hrs Continue Vancomycin 500 mg IV q12 for eAUC 480, Css min 14.8. SCr rounded up to 0.8, Vd 0.5 Consider Dc home on Bactrim +/- Augmentin    Height: 5' 1.5" (156.2 cm) Weight: 80.7 kg (178 lb) IBW/kg (Calculated) : 48.95  Temp (24hrs), Avg:98.6 F (37 C), Min:98.4 F (36.9 C), Max:98.9 F (37.2 C)  Recent Labs  Lab 12/09/23 0905 12/11/23 1554 12/11/23 1611 12/14/23 0536  WBC 3.2* 3.1*  --   --   CREATININE 0.52 0.62  --  0.43*  LATICACIDVEN  --   --  1.3  --     Estimated Creatinine Clearance: 71.9 mL/min (A) (by C-G formula based on SCr of 0.43 mg/dL (L)).    Allergies  Allergen Reactions   Irinotecan Liposome Shortness Of Breath and Other (See Comments)    ("Onivyde," given via IV) Pt had hypersensitivity rxn on 11/11/2023, see hypersensitivity rxn note for details. Pt had s/s of back pain, abdominal discomfort, SOB, flushing, and hypotension.     Antimicrobials this admission: 12/13 Vancomycin>> 12/13 cefepime>> Dose adjustments this admission:  Microbiology results: 12/13 BCx2: 1/4 bottles Staph  capitis  Thank you for allowing pharmacy to be a part of this patient's care.  Herby Abraham, Pharm.D Use secure chat for questions 12/14/2023 7:57 AM

## 2023-12-14 NOTE — Discharge Summary (Signed)
Physician Discharge Summary   Patient: Ashley Clark MRN: [id number] DOB: 08-11-1961  Admit date:     12/11/2023  Discharge date: 12/14/23  Discharge Physician: Meredeth Ide   PCP: [name], PA   Recommendations at discharge:   Follow-up PCP on 12/20, to check potassium level  Discharge Diagnoses: Principal Problem:   Infected venous access port, initial encounter Active Problems:   Bacteremia due to Staphylococcus   Pancreatic cancer (HCC)   Diabetes mellitus (HCC)   Obesity (BMI 30-39.9)   History of pulmonary embolism   Metastasis to liver [facility])  Resolved Problems:   * No resolved hospital problems. *  Hospital Course:  HPI: Ashley Clark is a 62 y.o. female with medical history significant of T2DM, HTN, left breast cancer in 2019 s/p left lumpectomy, and radiation, provoked PE on Eliquis, and a recently diagnosed pancreatic cancer with liver metastasis who presented from the cancer center for evaluation of infected port. Patient presented to her cancer center earlier today for cycle 3 of her chemotherapy. After the treatment, they planned for port de access and as soon as the needle was removed from her port site, pus was draining from the site. Patient also endorsed pain around the port site but denied any fevers, chills, nausea, vomiting, abdominal pain or chest pain. She was advised to present to the ED for port removal and IV antibiotics.   ED course: Initial vitals showed temp 98.4, RR 18, HR 104, BP 105/72, SpO2 100% on room air Labs show sodium 133, bicarb 21, K+ 3.8, creatinine 0.62, AST/ALT 76/54, alk phos 241, bili 0.8, WBC 3.1, Hgb 12.6, platelet 292, PT/INR 15.5/1.2, lactic acid 1.3 CXR with no active disease IR was consulted for port removal Patient was started on IV vancomycin and cefepime  Significant Events: Admitted 12/11/2023 for infected port-a-cath   Significant Labs: Admission labs sodium 133, bicarb 21, K+ 3.8, creatinine 0.62, AST/ALT  76/54, alk phos 241, bili 0.8, WBC 3.1, Hgb 12.6, platelet 292, PT/INR 15.5/1.2, lactic acid 1.3   Significant Imaging Studies: Admission CXR with no active disease   Antibiotic Therapy:  Procedures: 12-11-2023 port-a-cath removed  Consultants: IR  Assessment and Plan:   Infected venous access port, initial encounter 12-12-2023 s/p port-a-cath removal on 12-11-2023.  -Started on vancomycin and cefepime  -Blood cultures grew Staph capitis in 1 of 2 sets of blood culture, discussed with ID, no need of IV antibiotics  -Will discharge on cefadroxil and doxycycline for 7 days  Bacteremia due to Staphylococcus capitis -Discussed with ID, 1 out of 2 sets of blood cultures positive for Staphylococcus capitis -No need of IV antibiotics -Will discharge antibiotics as above -2D echo obtained did not show any vegetations on the valve, TEE not recommended per ID   Pancreatic cancer (HCC) 12-13-2023 just completed a round of chemo last week. Oncology following.  Metastasis to liver [facility]) 12-13-2023 followed by oncology  History of pulmonary embolism Stable. Continue eliquis bid.  Obesity (BMI 30-39.9) BMI 33.09  Diabetes mellitus (HCC) 12-13-2023 stable.  Hypokalemia -Potassium is 3.0 -Refused IV potassium supplementation as it was burning in her hand -Will give 1 dose of K-Dur 40 mEq p.o. x 1 -Discharge home on K-Dur 20 milligrams p.o. daily for 2 days -Recommend to follow-up PCP in 5 days to check potassium level in the office       Consultants:  Procedures performed: Oncology Disposition: Home Diet recommendation:  Discharge Diet Orders (From admission, onward)     Start  Ordered   12/14/23 0000  Diet - low sodium heart healthy        12/14/23 1407           Regular diet DISCHARGE MEDICATION: Allergies as of 12/14/2023       Reactions   Irinotecan Liposome Shortness Of Breath, Other (See Comments)   ("Onivyde," given via IV) Pt had hypersensitivity  rxn on 11/11/2023, see hypersensitivity rxn note for details. Pt had s/s of back pain, abdominal discomfort, SOB, flushing, and hypotension.         Medication List     TAKE these medications    cefadroxil 500 MG capsule Commonly known as: DURICEF Take 2 capsules (1,000 mg total) by mouth 2 (two) times daily for 7 days.   cetirizine 10 MG chewable tablet Commonly known as: ZYRTEC Chew 10 mg by mouth daily.   doxycycline 100 MG tablet Commonly known as: VIBRA-TABS Take 1 tablet (100 mg total) by mouth 2 (two) times daily for 7 days.   Eliquis 5 MG Tabs tablet Generic drug: apixaban Take 1 tablet (5 mg total) by mouth 2 (two) times daily.   lidocaine-prilocaine cream Commonly known as: EMLA Apply to affected area once What changed:  how much to take how to take this when to take this additional instructions   losartan 25 MG tablet Commonly known as: COZAAR Take 1 tablet (25 mg total) by mouth daily for kidney protection.   ondansetron 8 MG tablet Commonly known as: ZOFRAN Take 1 tablet (8 mg total) by mouth every 8 (eight) hours as needed for nausea or vomiting. Start on the third day after irinotecan   potassium chloride SA 20 MEQ tablet Commonly known as: KLOR-CON M Take 1 tablet (20 mEq total) by mouth daily for 2 days. Start taking on: December 15, 2023   prochlorperazine 10 MG tablet Commonly known as: COMPAZINE Take 1 tablet (10 mg total) by mouth every 6 (six) hours as needed for nausea or vomiting.        Discharge Exam: Filed Weights   12/11/23 1808  Weight: 80.7 kg   General-appears in no acute distress Heart-S1-S2, regular, no murmur auscultated Lungs-clear to auscultation bilaterally, no wheezing or crackles auscultated Abdomen-soft, nontender, no organomegaly Extremities-no edema in the lower extremities Neuro-alert, oriented x3, no focal deficit noted  Condition at discharge: good  The results of significant diagnostics from this  hospitalization (including imaging, microbiology, ancillary and laboratory) are listed below for reference.   Imaging Studies: ECHOCARDIOGRAM COMPLETE Result Date: 12/14/2023    ECHOCARDIOGRAM REPORT   Patient Name:   Ashley Clark Date of Exam: 12/14/2023 Medical Rec #:  [id number]        Height:       61.5 in Accession #:    [id number]       Weight:       178.0 lb Date of Birth:  [birth year]        BSA:          1.808 m Patient Age:    62 years         BP:           101/61 mmHg Patient Gender: F                HR:           76 bpm. Exam Location:  Inpatient Procedure: 2D Echo, Cardiac Doppler and Color Doppler Indications:    Bacteremia  History:  Patient has prior history of Echocardiogram examinations, most                 recent 10/30/2023. Risk Factors:Hypertension, Diabetes and                 Dyslipidemia.  Sonographer:    Vern Claude Referring Phys: [id number] ERIC CHEN IMPRESSIONS  1. Left ventricular ejection fraction, by estimation, is 60 to 65%. The left ventricle has normal function. The left ventricle has no regional wall motion abnormalities. Left ventricular diastolic parameters are consistent with Grade I diastolic dysfunction (impaired relaxation).  2. Right ventricular systolic function is normal. The right ventricular size is normal.  3. The mitral valve is normal in structure. Trivial mitral valve regurgitation. No evidence of mitral stenosis.  4. The aortic valve was not well visualized. Aortic valve regurgitation is not visualized. No aortic stenosis is present.  5. The inferior vena cava is normal in size with greater than 50% respiratory variability, suggesting right atrial pressure of 3 mmHg. Conclusion(s)/Recommendation(s): Very limited images due to poor sound wave transmission. Valves not seen well but look grossly normal. However, if clinical suspicion for endocarditis is high would recommend TEE to further evalaute. FINDINGS  Left Ventricle: Left ventricular ejection fraction,  by estimation, is 60 to 65%. The left ventricle has normal function. The left ventricle has no regional wall motion abnormalities. The left ventricular internal cavity size was normal in size. There is  no left ventricular hypertrophy. Left ventricular diastolic parameters are consistent with Grade I diastolic dysfunction (impaired relaxation). Right Ventricle: The right ventricular size is normal. No increase in right ventricular wall thickness. Right ventricular systolic function is normal. Left Atrium: Left atrial size was normal in size. Right Atrium: Right atrial size was normal in size. Pericardium: There is no evidence of pericardial effusion. Mitral Valve: The mitral valve is normal in structure. Trivial mitral valve regurgitation. No evidence of mitral valve stenosis. MV peak gradient, 2.3 mmHg. The mean mitral valve gradient is 1.0 mmHg. Tricuspid Valve: The tricuspid valve is normal in structure. Tricuspid valve regurgitation is trivial. No evidence of tricuspid stenosis. Aortic Valve: The aortic valve was not well visualized. Aortic valve regurgitation is not visualized. No aortic stenosis is present. Aortic valve mean gradient measures 4.0 mmHg. Aortic valve peak gradient measures 8.6 mmHg. Aortic valve area, by VTI measures 1.94 cm. Pulmonic Valve: The pulmonic valve was not well visualized. Pulmonic valve regurgitation is not visualized. No evidence of pulmonic stenosis. Aorta: The aortic root is normal in size and structure. Venous: The inferior vena cava is normal in size with greater than 50% respiratory variability, suggesting right atrial pressure of 3 mmHg. IAS/Shunts: No atrial level shunt detected by color flow Doppler.  LEFT VENTRICLE PLAX 2D LVIDd:         3.60 cm      Diastology LVIDs:         2.60 cm      LV e' medial:    7.07 cm/s LV PW:         0.70 cm      LV E/e' medial:  8.3 LV IVS:        0.70 cm      LV e' lateral:   11.50 cm/s LVOT diam:     1.80 cm      LV E/e' lateral: 5.1 LV  SV:         41 LV SV Index:   23 LVOT Area:  2.54 cm  LV Volumes (MOD) LV vol d, MOD A2C: 92.9 ml LV vol d, MOD A4C: 115.0 ml LV vol s, MOD A2C: 46.5 ml LV vol s, MOD A4C: 57.3 ml LV SV MOD A2C:     46.4 ml LV SV MOD A4C:     115.0 ml LV SV MOD BP:      53.9 ml RIGHT VENTRICLE             IVC RV Basal diam:  2.90 cm     IVC diam: 1.50 cm RV Mid diam:    2.10 cm RV S prime:     17.50 cm/s TAPSE (M-mode): 2.5 cm LEFT ATRIUM             Index        RIGHT ATRIUM          Index LA diam:        2.40 cm 1.33 cm/m   RA Area:     7.53 cm LA Vol (A2C):   26.3 ml 14.54 ml/m  RA Volume:   10.30 ml 5.70 ml/m LA Vol (A4C):   30.4 ml 16.81 ml/m LA Biplane Vol: 30.1 ml 16.64 ml/m  AORTIC VALVE                    PULMONIC VALVE AV Area (Vmax):    1.55 cm     PV Vmax:       0.83 m/s AV Area (Vmean):   1.35 cm     PV Peak grad:  2.8 mmHg AV Area (VTI):     1.94 cm AV Vmax:           147.00 cm/s AV Vmean:          92.200 cm/s AV VTI:            0.213 m AV Peak Grad:      8.6 mmHg AV Mean Grad:      4.0 mmHg LVOT Vmax:         89.50 cm/s LVOT Vmean:        49.000 cm/s LVOT VTI:          0.162 m LVOT/AV VTI ratio: 0.76  AORTA Ao Root diam: 2.80 cm Ao Asc diam:  2.90 cm MITRAL VALVE MV Area (PHT): 2.91 cm    SHUNTS MV Area VTI:   1.91 cm    Systemic VTI:  0.16 m MV Peak grad:  2.3 mmHg    Systemic Diam: 1.80 cm MV Mean grad:  1.0 mmHg MV Vmax:       0.76 m/s MV Vmean:      50.0 cm/s MV Decel Time: 261 msec MV E velocity: 58.80 cm/s MV A velocity: 76.50 cm/s MV E/A ratio:  0.77 Arvilla Meres MD Electronically signed by Arvilla Meres MD Signature Date/Time: 12/14/2023/12:11:59 PM    Final    IR REMOVAL TUN ACCESS W/ PORT W/O FL MOD SED Result Date: 12/12/2023 CLINICAL DATA:  Metastatic pancreatic carcinoma, status post port catheter placement 11/03/2023 by Dr. Miles Costain. Purulent discharge was noted around the needle access site today. On exam , the port is tender with swelling and redness over the septum. Patient is  afebrile. EXAM: EXAM TUNNELED PORT CATHETER REMOVAL TECHNIQUE: Overlying skin prepped with chlorhexidine, draped in usual sterile fashion, infiltrated locally with 1% lidocaine. A small incision was made over the scar from previous placement. Moderate amount of cloudy blood tinged fluid spontaneously discharged from around the port body  out the incision. The port was dissected free from the underlying soft tissues and removed intact. Hemostasis was achieved. The port pocket was packed with iodoform gauze and a sterile dressing applied. The patient tolerated the procedure well. COMPLICATIONS: COMPLICATIONS None immediate IMPRESSION: 1. Port pocket infection. Patient will follow-up early next week for dressing change. Electronically Signed   By: Corlis Leak M.D.   On: 12/12/2023 12:54   DG Chest Port 1 View Result Date: 12/11/2023 CLINICAL DATA:  Questionable sepsis - evaluate for abnormality. Suspected CT port-A-cath infection. No fever. EXAM: PORTABLE CHEST 1 VIEW COMPARISON:  10/26/2023. FINDINGS: Bilateral lung fields are clear. Bilateral costophrenic angles are clear. Stable cardio-mediastinal silhouette. No acute osseous abnormalities. The soft tissues are within normal limits. Right-sided CT Port-A-Cath is seen with its tip overlying the cavoatrial junction region. IMPRESSION: No active disease. Electronically Signed   By: Jules Schick M.D.   On: 12/11/2023 18:00    Microbiology: Results for orders placed or performed during the hospital encounter of 12/11/23  Blood Culture (routine x 2)     Status: Abnormal   Collection Time: 12/11/23  3:54 PM   Specimen: BLOOD  Result Value Ref Range Status   Specimen Description   Final    BLOOD BLOOD LEFT ARM Performed at [facility], [address], [place], [place] [address]    Special Requests   Final    BOTTLES DRAWN AEROBIC AND ANAEROBIC Blood Culture results may not be optimal due to an inadequate volume of blood received in  culture bottles Performed at [id number], [address], [place], [place] [address]    Culture  Setup Time   Final    GRAM POSITIVE COCCI ANAEROBIC BOTTLE ONLY CRITICAL RESULT CALLED TO, READ BACK BY AND VERIFIED WITH: PHARMD SYDNEY DAVIS [id number] AT 1212 BY EC    Culture (A)  Final    STAPHYLOCOCCUS CAPITIS THE SIGNIFICANCE OF ISOLATING THIS ORGANISM FROM A SINGLE SET OF BLOOD CULTURES WHEN MULTIPLE SETS ARE DRAWN IS UNCERTAIN. PLEASE NOTIFY THE MICROBIOLOGY DEPARTMENT WITHIN ONE WEEK IF SPECIATION AND SENSITIVITIES ARE REQUIRED. Performed at [facility], [address], [place], [place] [address]    Report Status 12/14/2023 FINAL  Final  Blood Culture (routine x 2)     Status: None (Preliminary result)   Collection Time: 12/11/23  3:54 PM   Specimen: BLOOD  Result Value Ref Range Status   Specimen Description   Final    BLOOD LEFT ANTECUBITAL Performed at [facility], [address], [place], [place] [address]    Special Requests   Final    BOTTLES DRAWN AEROBIC AND ANAEROBIC Blood Culture results may not be optimal due to an inadequate volume of blood received in culture bottles Performed at [facility], [address], [place], [place] [address]    Culture   Final    NO GROWTH 3 DAYS Performed at [facility], [address], [place], [place] [address]    Report Status PENDING  Incomplete  Blood Culture ID Panel (Reflexed)     Status: Abnormal   Collection Time: 12/11/23  3:54 PM  Result Value Ref Range Status   Enterococcus faecalis NOT DETECTED NOT DETECTED Final   Enterococcus Faecium NOT DETECTED NOT DETECTED Final   Listeria monocytogenes NOT DETECTED NOT DETECTED Final   Staphylococcus species DETECTED (A) NOT DETECTED Final    Comment: CRITICAL RESULT CALLED TO, READ BACK BY AND VERIFIED  WITH: PHARMD SYDNEY DAVIS [id number] AT 1212 BY EC    Staphylococcus aureus (BCID) NOT DETECTED NOT DETECTED  Final   Staphylococcus epidermidis NOT DETECTED NOT DETECTED Final   Staphylococcus lugdunensis NOT DETECTED NOT DETECTED Final   Streptococcus species NOT DETECTED NOT DETECTED Final   Streptococcus agalactiae NOT DETECTED NOT DETECTED Final   Streptococcus pneumoniae NOT DETECTED NOT DETECTED Final   Streptococcus pyogenes NOT DETECTED NOT DETECTED Final   A.calcoaceticus-baumannii NOT DETECTED NOT DETECTED Final   Bacteroides fragilis NOT DETECTED NOT DETECTED Final   Enterobacterales NOT DETECTED NOT DETECTED Final   Enterobacter cloacae complex NOT DETECTED NOT DETECTED Final   Escherichia coli NOT DETECTED NOT DETECTED Final   Klebsiella aerogenes NOT DETECTED NOT DETECTED Final   Klebsiella oxytoca NOT DETECTED NOT DETECTED Final   Klebsiella pneumoniae NOT DETECTED NOT DETECTED Final   Proteus species NOT DETECTED NOT DETECTED Final   Salmonella species NOT DETECTED NOT DETECTED Final   Serratia marcescens NOT DETECTED NOT DETECTED Final   Haemophilus influenzae NOT DETECTED NOT DETECTED Final   Neisseria meningitidis NOT DETECTED NOT DETECTED Final   Pseudomonas aeruginosa NOT DETECTED NOT DETECTED Final   Stenotrophomonas maltophilia NOT DETECTED NOT DETECTED Final   Candida albicans NOT DETECTED NOT DETECTED Final   Candida auris NOT DETECTED NOT DETECTED Final   Candida glabrata NOT DETECTED NOT DETECTED Final   Candida krusei NOT DETECTED NOT DETECTED Final   Candida parapsilosis NOT DETECTED NOT DETECTED Final   Candida tropicalis NOT DETECTED NOT DETECTED Final   Cryptococcus neoformans/gattii NOT DETECTED NOT DETECTED Final    Comment: Performed at [facility], [address] [place], [place] [address]    Labs: CBC: Recent Labs  Lab 12/09/23 0905 12/11/23 1554 12/14/23 1017  WBC 3.2* 3.1* 11.2*  NEUTROABS 1.1* 1.3* 9.3*  HGB 11.9* 12.6 11.3*  HCT 36.4 38.9 35.5*  MCV 76.6* 77.6* 78.2*  PLT 260 292 227   Basic Metabolic Panel: Recent Labs  Lab  12/09/23 0905 12/11/23 1554 12/14/23 0536 12/14/23 1010 12/14/23 1017  NA 136 133*  --   --  132*  K 3.2* 3.8  --   --  3.0*  CL 103 101  --   --  102  CO2 23 21*  --   --  22  GLUCOSE 180* 115*  --   --  133*  BUN 7* 12  --   --  8  CREATININE 0.52 0.62 0.43*  --  0.45  CALCIUM 9.0 9.1  --   --  8.8*  MG  --   --   --  2.2  --    Liver Function Tests: Recent Labs  Lab 12/09/23 0905 12/11/23 1554 12/14/23 1017  AST 45* 76* 30  ALT 42 54* 31  ALKPHOS 253* 241* 238*  BILITOT 0.4 0.8 0.6  PROT 7.2 7.5 6.6  ALBUMIN 3.5 3.7 3.1*   CBG: No results for input(s): "GLUCAP" in the last 168 hours.  Discharge time spent: greater than 30 minutes.  Signed: Meredeth Ide, MD [facility] 12/14/2023

## 2023-12-14 NOTE — Progress Notes (Signed)
Ashley Clark   DOB:1961-04-02   WU#:[id number]      ASSESSMENT & PLAN:  Infected port, right anterior chest wall/Bacteremia -Patient admitted with infected port which was removed on 12/11/2023.  Staph detected. -Continue IV antibiotics as ordered -Afebrile, monitor fever curve -Patient reports that she has an appointment with IR for dressing management tomorrow 12/17, does not want staff here to change the dressing.  2.  Metastatic pancreatic cancer with liver mets -Diagnosed November 2024 -On chemotherapy, cycle 3 NALIRIFOX completed/Udenyca received 12/13. -Continue daily Claritin. -Next treatment scheduled 12/24/2023 per patient -Will schedule scan after cycle 5 or 6 of chemotherapy -Follow closely with Dr. Feng/medical oncology  3.  History of left breast CA -Diagnosed 2019 -Status post left lumpectomy in 2019 -Received radiation therapy.  Letrozole 2.5 mg daily. -Continue to monitor per protocol  Code Status Full   Discharge planning: To home when medically optimized  Subjective:  Patient states she came to the ED on Friday because of the pain at her port site.  States she never had a fever just the tenderness around the site.  Reports she is feeling much better today after port was removed and packed by IR.  Denies nausea, vomiting, other acute complaints.  Objective:  Vitals:   12/13/23 2123 12/14/23 0701  BP: 107/72 101/61  Pulse: 83 82  Resp: 18 20  Temp: 98.9 F (37.2 C) 98.4 F (36.9 C)  SpO2: 97% 100%     Intake/Output Summary (Last 24 hours) at 12/14/2023 1122 Last data filed at 12/14/2023 0149 Gross per 24 hour  Intake 831.67 ml  Output --  Net 831.67 ml     REVIEW OF SYSTEMS:   Constitutional: Denies fevers, chills or abnormal night sweats Eyes: Denies blurriness of vision, double vision or watery eyes Ears, nose, mouth, throat, and face: Denies mucositis or sore throat Respiratory: Denies cough, dyspnea or wheezes Cardiovascular: Denies  palpitation, chest discomfort or lower extremity swelling Gastrointestinal:  Denies nausea, heartburn or change in bowel habits Skin: Denies abnormal skin rashes Lymphatics: Denies new lymphadenopathy or easy bruising Neurological: Denies numbness, tingling or new weaknesses Behavioral/Psych: Mood is stable, no new changes  All other systems were reviewed with the patient and are negative.  PHYSICAL EXAMINATION: ECOG PERFORMANCE STATUS: 1 - Symptomatic but completely ambulatory  Vitals:   12/13/23 2123 12/14/23 0701  BP: 107/72 101/61  Pulse: 83 82  Resp: 18 20  Temp: 98.9 F (37.2 C) 98.4 F (36.9 C)  SpO2: 97% 100%   Filed Weights   12/11/23 1808  Weight: 178 lb (80.7 kg)    GENERAL: alert, no distress and comfortable SKIN: skin color, texture, turgor are normal, no rashes or significant lesions +dressing R ACW over site of POC removal EYES: normal, conjunctiva are pink and non-injected, sclera clear OROPHARYNX: no exudate, no erythema and lips, buccal mucosa, and tongue normal  NECK: supple, thyroid normal size, non-tender, without nodularity LYMPH: no palpable lymphadenopathy in the cervical, axillary or inguinal LUNGS: clear to auscultation and percussion with normal breathing effort HEART: regular rate & rhythm and no murmurs and no lower extremity edema ABDOMEN: abdomen soft, non-tender and normal bowel sounds MUSCULOSKELETAL: no cyanosis of digits and no clubbing  PSYCH: alert & oriented x 3 with fluent speech NEURO: no focal motor/sensory deficits   All questions were answered. The patient knows to call the clinic with any problems, questions or concerns.   The total time spent in the appointment was 30 minutes encounter with patient  including review of chart and various tests results, discussions about plan of care and coordination of care plan  Dawson Bills, NP 12/14/2023 11:22 AM    Labs Reviewed:  Lab Results  Component Value Date   WBC 11.2 (H)  12/14/2023   HGB 11.3 (L) 12/14/2023   HCT 35.5 (L) 12/14/2023   MCV 78.2 (L) 12/14/2023   PLT 227 12/14/2023   Recent Labs    10/29/23 1234 10/30/23 0721 11/25/23 0833 12/09/23 0905 12/11/23 1554 12/14/23 0536  NA 130*   < > 131* 136 133*  --   K 3.7   < > 3.4* 3.2* 3.8  --   CL 93*   < > 92* 103 101  --   CO2 23   < > 30 23 21*  --   GLUCOSE 125*   < > 121* 180* 115*  --   BUN 6*   < > 10 7* 12  --   CREATININE 0.61   < > 0.50 0.52 0.62 0.43*  CALCIUM 8.8*   < > 9.4 9.0 9.1  --   GFRNONAA >60   < > >60 >60 >60 >60  PROT 7.9   < > 7.0 7.2 7.5  --   ALBUMIN 3.8   < > 3.5 3.5 3.7  --   AST 31   < > 118* 45* 76*  --   ALT 20   < > 91* 42 54*  --   ALKPHOS 216*   < > 337* 253* 241*  --   BILITOT 0.8   < > 0.6 0.4 0.8  --   BILIDIR 0.2  --   --   --   --   --   IBILI 0.6  --   --   --   --   --    < > = values in this interval not displayed.    Studies Reviewed:  IR REMOVAL TUN ACCESS W/ PORT W/O FL MOD SED Result Date: 12/12/2023 CLINICAL DATA:  Metastatic pancreatic carcinoma, status post port catheter placement 11/03/2023 by Dr. Miles Costain. Purulent discharge was noted around the needle access site today. On exam , the port is tender with swelling and redness over the septum. Patient is afebrile. EXAM: EXAM TUNNELED PORT CATHETER REMOVAL TECHNIQUE: Overlying skin prepped with chlorhexidine, draped in usual sterile fashion, infiltrated locally with 1% lidocaine. A small incision was made over the scar from previous placement. Moderate amount of cloudy blood tinged fluid spontaneously discharged from around the port body out the incision. The port was dissected free from the underlying soft tissues and removed intact. Hemostasis was achieved. The port pocket was packed with iodoform gauze and a sterile dressing applied. The patient tolerated the procedure well. COMPLICATIONS: COMPLICATIONS None immediate IMPRESSION: 1. Port pocket infection. Patient will follow-up early next week for  dressing change. Electronically Signed   By: Corlis Leak M.D.   On: 12/12/2023 12:54   DG Chest Port 1 View Result Date: 12/11/2023 CLINICAL DATA:  Questionable sepsis - evaluate for abnormality. Suspected CT port-A-cath infection. No fever. EXAM: PORTABLE CHEST 1 VIEW COMPARISON:  10/26/2023. FINDINGS: Bilateral lung fields are clear. Bilateral costophrenic angles are clear. Stable cardio-mediastinal silhouette. No acute osseous abnormalities. The soft tissues are within normal limits. Right-sided CT Port-A-Cath is seen with its tip overlying the cavoatrial junction region. IMPRESSION: No active disease. Electronically Signed   By: Jules Schick M.D.   On: 12/11/2023 18:00

## 2023-12-14 NOTE — Progress Notes (Signed)
  Echocardiogram 2D Echocardiogram has been performed.  Ocie Doyne RDCS 12/14/2023, 12:09 PM

## 2023-12-15 ENCOUNTER — Telehealth: Payer: Self-pay

## 2023-12-15 ENCOUNTER — Ambulatory Visit: Payer: 59

## 2023-12-15 ENCOUNTER — Inpatient Hospital Stay (HOSPITAL_COMMUNITY): Admit: 2023-12-15 | Payer: 59

## 2023-12-15 ENCOUNTER — Other Ambulatory Visit (HOSPITAL_COMMUNITY): Payer: Self-pay | Admitting: Interventional Radiology

## 2023-12-15 ENCOUNTER — Ambulatory Visit (HOSPITAL_COMMUNITY)
Admission: RE | Admit: 2023-12-15 | Discharge: 2023-12-15 | Disposition: A | Discharge status: Home / Self Care | Payer: 59 | Source: Ambulatory Visit | Attending: Interventional Radiology | Admitting: Interventional Radiology

## 2023-12-15 ENCOUNTER — Ambulatory Visit: Payer: 59 | Admitting: Hematology

## 2023-12-15 ENCOUNTER — Encounter (HOSPITAL_COMMUNITY): Payer: Self-pay | Admitting: Radiology

## 2023-12-15 ENCOUNTER — Other Ambulatory Visit: Payer: 59

## 2023-12-15 ENCOUNTER — Other Ambulatory Visit (HOSPITAL_COMMUNITY): Payer: Self-pay

## 2023-12-15 DIAGNOSIS — B999 Unspecified infectious disease: Secondary | ICD-10-CM | POA: Diagnosis not present

## 2023-12-15 DIAGNOSIS — C251 Malignant neoplasm of body of pancreas: Secondary | ICD-10-CM

## 2023-12-15 HISTORY — PX: IR RADIOLOGIST EVAL & MGMT: IMG5224

## 2023-12-15 NOTE — Telephone Encounter (Signed)
Spoke with pt via telephone regarding PICC or Port placement by 12/24/2023.  Pt decided she would like to get a PICC since her previous port got infected.  Placed order for PICC placement.

## 2023-12-15 NOTE — Progress Notes (Signed)
Chief Complaint: Port site problem  Referring Physician(s): Hassell,Daniel  History of Present Illness: Ashley Clark is a 62 y.o. female presenting as scheduled follow up to VIR at [facility], for evaluation of right port site.   Ms [name] had port placed 11/03/23, and after 3 rounds of prescribed therapy, there was determination of infection.   She was referred to VIR for removal of the port 12/13, and the port was removed, packed with iodoform gauze. She was admitted over the weekend for bacteremia.  Blood cultures on 12/13 + for gram positive cocci, culture = staph capitis.   She received IV abx over the weekend, then [facility] 12/14/23.   Discharge notes reflect:  Infected venous access port, initial encounter 12-12-2023 s/p port-a-cath removal on 12-11-2023.  -Started on vancomycin and cefepime  -Blood cultures grew Staph capitis in 1 of 2 sets of blood culture, discussed with ID, no need of IV antibiotics  -Will discharge on cefadroxil and doxycycline for 7 days   Bacteremia due to Staphylococcus capitis -Discussed with ID, 1 out of 2 sets of blood cultures positive for Staphylococcus capitis -No need of IV antibiotics -Will discharge antibiotics as above -2D echo obtained did not show any vegetations on the valve, TEE not recommended per ID.   Currently she denies any recurrent fevers rigors chills.  She is making urine.  She is tolerating diet and denies N/V/D.   Past Medical History:  Diagnosis Date   Acute pulmonary embolism (HCC) 10/29/2023   Allergy    Back pain    Cancer (HCC) 2019   left breast cancer-last radiation Dec.2019   Diabetes mellitus without complication [facility])    Medical history non-contributory    Personal history of radiation therapy    Port-A-Cath in place - removed 12-11-2023 due to port-a-cath infection. 11/13/2023   Prediabetes    Tiredness    Vitamin D deficiency     Past Surgical History:  Procedure Laterality Date   BREAST BIOPSY Left  08/04/2018   x3   BREAST LUMPECTOMY Left    09-29-18   BREAST LUMPECTOMY WITH RADIOACTIVE SEED AND SENTINEL LYMPH NODE BIOPSY Left 09/29/2018   Procedure: LEFT BREAST LUMPECTOMY WITH RADIOACTIVE SEED AND SENTINEL LYMPH NODE BIOPSY;  Surgeon: [name], MD;  Location: [facility];  Service: General;  Laterality: Left;   COLONOSCOPY  2016   IR IMAGING GUIDED PORT INSERTION  11/03/2023   IR REMOVAL TUN ACCESS W/ PORT W/O FL MOD SED  12/11/2023   POLYPECTOMY     WISDOM TOOTH EXTRACTION      Allergies: Irinotecan liposome  Medications: Prior to Admission medications   Medication Sig Start Date End Date Taking? Authorizing Provider  apixaban (ELIQUIS) 5 MG TABS tablet Take 1 tablet (5 mg total) by mouth 2 (two) times daily. 11/25/23   Carlean Jews, NP  cefadroxil (DURICEF) 500 MG capsule Take 2 capsules (1,000 mg total) by mouth 2 (two) times daily for 7 days. 12/14/23 12/21/23  Meredeth Ide, MD  cetirizine (ZYRTEC) 10 MG chewable tablet Chew 10 mg by mouth daily.    [provider]  doxycycline (VIBRA-TABS) 100 MG tablet Take 1 tablet (100 mg total) by mouth 2 (two) times daily for 7 days. 12/14/23 12/21/23  Meredeth Ide, MD  lidocaine-prilocaine (EMLA) cream Apply to affected area once Patient taking differently: Apply 1 Application topically See admin instructions. Apply to affected area for port access 11/03/23   [name], MD  losartan (COZAAR)  25 MG tablet Take 1 tablet (25 mg total) by mouth daily for kidney protection. 06/26/22     ondansetron (ZOFRAN) 8 MG tablet Take 1 tablet (8 mg total) by mouth every 8 (eight) hours as needed for nausea or vomiting. Start on the third day after irinotecan 11/03/23   [name], MD  potassium chloride SA (KLOR-CON M) 20 MEQ tablet Take 1 tablet (20 mEq total) by mouth daily for 2 days. 12/15/23 12/17/23  Meredeth Ide, MD  prochlorperazine (COMPAZINE) 10 MG tablet Take 1 tablet (10 mg total) by mouth every 6 (six) hours  as needed for nausea or vomiting. 11/03/23   [name], MD     Family History  Problem Relation Age of Onset   Breast cancer Mother 68   Colon polyps Mother    Colon cancer Neg Hx    Rectal cancer Neg Hx    Stomach cancer Neg Hx    Esophageal cancer Neg Hx     Social History   Socioeconomic History   Marital status: Married    Spouse name: Not on file   Number of children: Not on file   Years of education: Not on file   Highest education level: Not on file  Occupational History   Not on file  Tobacco Use   Smoking status: Former    Current packs/day: 0.00    Types: Cigarettes    Quit date: 01/2015    Years since quitting: 8.8   Smokeless tobacco: Never   Tobacco comments:    3-4 cigs a day   Vaping Use   Vaping status: Never Used  Substance and Sexual Activity   Alcohol use: Not Currently    Alcohol/week: 4.0 standard drinks of alcohol    Types: 4 Glasses of wine per week   Drug use: No   Sexual activity: Not on file  Other Topics Concern   Not on file  Social History Narrative   Not on file   Social Drivers of Health   Financial Resource Strain: Not on file  Food Insecurity: No Food Insecurity (12/11/2023)   Hunger Vital Sign    Worried About Running Out of Food in the Last Year: Never true    Ran Out of Food in the Last Year: Never true  Transportation Needs: No Transportation Needs (12/11/2023)   PRAPARE - Administrator, Civil Service (Medical): No    Lack of Transportation (Non-Medical): No  Physical Activity: Not on file  Stress: Not on file  Social Connections: Not on file      Review of Systems: A 12 point ROS discussed and pertinent positives are indicated in the HPI above.  All other systems are negative.  Review of Systems  Vital Signs: LMP 03/12/2015     Physical Exam Port site is open, packed with iodoform gauze.  No purulent drainage.  No bleeding.  No significant redness.  No necrotic tissue.   Mallampati Score:      Imaging: ECHOCARDIOGRAM COMPLETE Result Date: 12/14/2023    ECHOCARDIOGRAM REPORT   Patient Name:   Ashley Clark Date of Exam: 12/14/2023 Medical Rec #:  [id number]        Height:       61.5 in Accession #:    [id number]       Weight:       178.0 lb Date of Birth:  [birth year]        BSA:  1.808 m Patient Age:    62 years         BP:           101/61 mmHg Patient Gender: F                HR:           76 bpm. Exam Location:  Inpatient Procedure: 2D Echo, Cardiac Doppler and Color Doppler Indications:    Bacteremia  History:        Patient has prior history of Echocardiogram examinations, most                 recent 10/30/2023. Risk Factors:Hypertension, Diabetes and                 Dyslipidemia.  Sonographer:    Vern Claude Referring Phys: [id number] ERIC CHEN IMPRESSIONS  1. Left ventricular ejection fraction, by estimation, is 60 to 65%. The left ventricle has normal function. The left ventricle has no regional wall motion abnormalities. Left ventricular diastolic parameters are consistent with Grade I diastolic dysfunction (impaired relaxation).  2. Right ventricular systolic function is normal. The right ventricular size is normal.  3. The mitral valve is normal in structure. Trivial mitral valve regurgitation. No evidence of mitral stenosis.  4. The aortic valve was not well visualized. Aortic valve regurgitation is not visualized. No aortic stenosis is present.  5. The inferior vena cava is normal in size with greater than 50% respiratory variability, suggesting right atrial pressure of 3 mmHg. Conclusion(s)/Recommendation(s): Very limited images due to poor sound wave transmission. Valves not seen well but look grossly normal. However, if clinical suspicion for endocarditis is high would recommend TEE to further evalaute. FINDINGS  Left Ventricle: Left ventricular ejection fraction, by estimation, is 60 to 65%. The left ventricle has normal function. The left ventricle has no regional wall motion  abnormalities. The left ventricular internal cavity size was normal in size. There is  no left ventricular hypertrophy. Left ventricular diastolic parameters are consistent with Grade I diastolic dysfunction (impaired relaxation). Right Ventricle: The right ventricular size is normal. No increase in right ventricular wall thickness. Right ventricular systolic function is normal. Left Atrium: Left atrial size was normal in size. Right Atrium: Right atrial size was normal in size. Pericardium: There is no evidence of pericardial effusion. Mitral Valve: The mitral valve is normal in structure. Trivial mitral valve regurgitation. No evidence of mitral valve stenosis. MV peak gradient, 2.3 mmHg. The mean mitral valve gradient is 1.0 mmHg. Tricuspid Valve: The tricuspid valve is normal in structure. Tricuspid valve regurgitation is trivial. No evidence of tricuspid stenosis. Aortic Valve: The aortic valve was not well visualized. Aortic valve regurgitation is not visualized. No aortic stenosis is present. Aortic valve mean gradient measures 4.0 mmHg. Aortic valve peak gradient measures 8.6 mmHg. Aortic valve area, by VTI measures 1.94 cm. Pulmonic Valve: The pulmonic valve was not well visualized. Pulmonic valve regurgitation is not visualized. No evidence of pulmonic stenosis. Aorta: The aortic root is normal in size and structure. Venous: The inferior vena cava is normal in size with greater than 50% respiratory variability, suggesting right atrial pressure of 3 mmHg. IAS/Shunts: No atrial level shunt detected by color flow Doppler.  LEFT VENTRICLE PLAX 2D LVIDd:         3.60 cm      Diastology LVIDs:         2.60 cm      LV e' medial:  7.07 cm/s LV PW:         0.70 cm      LV E/e' medial:  8.3 LV IVS:        0.70 cm      LV e' lateral:   11.50 cm/s LVOT diam:     1.80 cm      LV E/e' lateral: 5.1 LV SV:         41 LV SV Index:   23 LVOT Area:     2.54 cm  LV Volumes (MOD) LV vol d, MOD A2C: 92.9 ml LV vol d, MOD  A4C: 115.0 ml LV vol s, MOD A2C: 46.5 ml LV vol s, MOD A4C: 57.3 ml LV SV MOD A2C:     46.4 ml LV SV MOD A4C:     115.0 ml LV SV MOD BP:      53.9 ml RIGHT VENTRICLE             IVC RV Basal diam:  2.90 cm     IVC diam: 1.50 cm RV Mid diam:    2.10 cm RV S prime:     17.50 cm/s TAPSE (M-mode): 2.5 cm LEFT ATRIUM             Index        RIGHT ATRIUM          Index LA diam:        2.40 cm 1.33 cm/m   RA Area:     7.53 cm LA Vol (A2C):   26.3 ml 14.54 ml/m  RA Volume:   10.30 ml 5.70 ml/m LA Vol (A4C):   30.4 ml 16.81 ml/m LA Biplane Vol: 30.1 ml 16.64 ml/m  AORTIC VALVE                    PULMONIC VALVE AV Area (Vmax):    1.55 cm     PV Vmax:       0.83 m/s AV Area (Vmean):   1.35 cm     PV Peak grad:  2.8 mmHg AV Area (VTI):     1.94 cm AV Vmax:           147.00 cm/s AV Vmean:          92.200 cm/s AV VTI:            0.213 m AV Peak Grad:      8.6 mmHg AV Mean Grad:      4.0 mmHg LVOT Vmax:         89.50 cm/s LVOT Vmean:        49.000 cm/s LVOT VTI:          0.162 m LVOT/AV VTI ratio: 0.76  AORTA Ao Root diam: 2.80 cm Ao Asc diam:  2.90 cm MITRAL VALVE MV Area (PHT): 2.91 cm    SHUNTS MV Area VTI:   1.91 cm    Systemic VTI:  0.16 m MV Peak grad:  2.3 mmHg    Systemic Diam: 1.80 cm MV Mean grad:  1.0 mmHg MV Vmax:       0.76 m/s MV Vmean:      50.0 cm/s MV Decel Time: 261 msec MV E velocity: 58.80 cm/s MV A velocity: 76.50 cm/s MV E/A ratio:  0.77 Arvilla Meres MD Electronically signed by Arvilla Meres MD Signature Date/Time: 12/14/2023/12:11:59 PM    Final    IR REMOVAL TUN ACCESS W/ PORT W/O FL MOD SED Result Date: 12/12/2023 CLINICAL DATA:  Metastatic pancreatic carcinoma, status post port catheter placement 11/03/2023 by Dr. Miles Costain. Purulent discharge was noted around the needle access site today. On exam , the port is tender with swelling and redness over the septum. Patient is afebrile. EXAM: EXAM TUNNELED PORT CATHETER REMOVAL TECHNIQUE: Overlying skin prepped with chlorhexidine, draped in  usual sterile fashion, infiltrated locally with 1% lidocaine. A small incision was made over the scar from previous placement. Moderate amount of cloudy blood tinged fluid spontaneously discharged from around the port body out the incision. The port was dissected free from the underlying soft tissues and removed intact. Hemostasis was achieved. The port pocket was packed with iodoform gauze and a sterile dressing applied. The patient tolerated the procedure well. COMPLICATIONS: COMPLICATIONS None immediate IMPRESSION: 1. Port pocket infection. Patient will follow-up early next week for dressing change. Electronically Signed   By: Corlis Leak M.D.   On: 12/12/2023 12:54   DG Chest Port 1 View Result Date: 12/11/2023 CLINICAL DATA:  Questionable sepsis - evaluate for abnormality. Suspected CT port-A-cath infection. No fever. EXAM: PORTABLE CHEST 1 VIEW COMPARISON:  10/26/2023. FINDINGS: Bilateral lung fields are clear. Bilateral costophrenic angles are clear. Stable cardio-mediastinal silhouette. No acute osseous abnormalities. The soft tissues are within normal limits. Right-sided CT Port-A-Cath is seen with its tip overlying the cavoatrial junction region. IMPRESSION: No active disease. Electronically Signed   By: Jules Schick M.D.   On: 12/11/2023 18:00    Labs:  CBC: Recent Labs    11/25/23 0833 12/09/23 0905 12/11/23 1554 12/14/23 1017  WBC 3.4* 3.2* 3.1* 11.2*  HGB 12.6 11.9* 12.6 11.3*  HCT 38.0 36.4 38.9 35.5*  PLT 421* 260 292 227    COAGS: Recent Labs    11/02/23 0415 12/11/23 1554  INR 1.2 1.2  APTT  --  28    BMP: Recent Labs    11/25/23 0833 12/09/23 0905 12/11/23 1554 12/14/23 0536 12/14/23 1017  NA 131* 136 133*  --  132*  K 3.4* 3.2* 3.8  --  3.0*  CL 92* 103 101  --  102  CO2 30 23 21*  --  22  GLUCOSE 121* 180* 115*  --  133*  BUN 10 7* 12  --  8  CALCIUM 9.4 9.0 9.1  --  8.8*  CREATININE 0.50 0.52 0.62 0.43* 0.45  GFRNONAA >60 >60 >60 >60 >60     LIVER FUNCTION TESTS: Recent Labs    11/25/23 0833 12/09/23 0905 12/11/23 1554 12/14/23 1017  BILITOT 0.6 0.4 0.8 0.6  AST 118* 45* 76* 30  ALT 91* 42 54* 31  ALKPHOS 337* 253* 241* 238*  PROT 7.0 7.2 7.5 6.6  ALBUMIN 3.5 3.5 3.7 3.1*    TUMOR MARKERS: No results for input(s): "AFPTM", "CEA", "CA199", "CHROMGRNA" in the last 8760 hours.  Assessment and Plan:   Ms Wendel is 62 yo female with right internal jugular port site removed for infection, bacteremia.   She is doing well today, with no recurrent symptoms of fever.  Port site was repacked with hydrogel.    We will set appointment for this Friday for another visit/check.   Open right internal jugular port site  - closing with secondary intention - applied hydro-get today - plan to see her back this Friday for another assessment and re-treatment - dry dressing/tegaderm coverage  Infection/bacteremia - continue PO abx, on cefadroxil and doxycycline for 7 days  Pancreatic CA with IV access need: - patient reports next therapy is late December  12/26. She will potentially need PICC before then    Electronically Signed: Gilmer Mor 12/15/2023, 1:11 PM   I spent a total of    25 Minutes in face to face in clinical consultation, greater than 50% of which was counseling/coordinating care for port site infection, packing of the port site

## 2023-12-15 NOTE — Progress Notes (Signed)
Ashley Clark   DOB:1961-04-02   WU#:[id number]      ASSESSMENT & PLAN:  Infected port, right anterior chest wall/Bacteremia -Patient admitted with infected port which was removed on 12/11/2023.  Staph detected. -Continue IV antibiotics as ordered -Afebrile, monitor fever curve -Patient reports that she has an appointment with IR for dressing management tomorrow 12/17, does not want staff here to change the dressing.  2.  Metastatic pancreatic cancer with liver mets -Diagnosed November 2024 -On chemotherapy, cycle 3 NALIRIFOX completed/Udenyca received 12/13. -Continue daily Claritin. -Next treatment scheduled 12/24/2023 per patient -Will schedule scan after cycle 5 or 6 of chemotherapy -Follow closely with Dr. Feng/medical oncology  3.  History of left breast CA -Diagnosed 2019 -Status post left lumpectomy in 2019 -Received radiation therapy.  Letrozole 2.5 mg daily. -Continue to monitor per protocol  Code Status Full   Discharge planning: To home when medically optimized  Subjective:  Patient states she came to the ED on Friday because of the pain at her port site.  States she never had a fever just the tenderness around the site.  Reports she is feeling much better today after port was removed and packed by IR.  Denies nausea, vomiting, other acute complaints.  Objective:  Vitals:   12/13/23 2123 12/14/23 0701  BP: 107/72 101/61  Pulse: 83 82  Resp: 18 20  Temp: 98.9 F (37.2 C) 98.4 F (36.9 C)  SpO2: 97% 100%     Intake/Output Summary (Last 24 hours) at 12/14/2023 1122 Last data filed at 12/14/2023 0149 Gross per 24 hour  Intake 831.67 ml  Output --  Net 831.67 ml     REVIEW OF SYSTEMS:   Constitutional: Denies fevers, chills or abnormal night sweats Eyes: Denies blurriness of vision, double vision or watery eyes Ears, nose, mouth, throat, and face: Denies mucositis or sore throat Respiratory: Denies cough, dyspnea or wheezes Cardiovascular: Denies  palpitation, chest discomfort or lower extremity swelling Gastrointestinal:  Denies nausea, heartburn or change in bowel habits Skin: Denies abnormal skin rashes Lymphatics: Denies new lymphadenopathy or easy bruising Neurological: Denies numbness, tingling or new weaknesses Behavioral/Psych: Mood is stable, no new changes  All other systems were reviewed with the patient and are negative.  PHYSICAL EXAMINATION: ECOG PERFORMANCE STATUS: 1 - Symptomatic but completely ambulatory  Vitals:   12/13/23 2123 12/14/23 0701  BP: 107/72 101/61  Pulse: 83 82  Resp: 18 20  Temp: 98.9 F (37.2 C) 98.4 F (36.9 C)  SpO2: 97% 100%   Filed Weights   12/11/23 1808  Weight: 178 lb (80.7 kg)    GENERAL: alert, no distress and comfortable SKIN: skin color, texture, turgor are normal, no rashes or significant lesions +dressing R ACW over site of POC removal EYES: normal, conjunctiva are pink and non-injected, sclera clear OROPHARYNX: no exudate, no erythema and lips, buccal mucosa, and tongue normal  NECK: supple, thyroid normal size, non-tender, without nodularity LYMPH: no palpable lymphadenopathy in the cervical, axillary or inguinal LUNGS: clear to auscultation and percussion with normal breathing effort HEART: regular rate & rhythm and no murmurs and no lower extremity edema ABDOMEN: abdomen soft, non-tender and normal bowel sounds MUSCULOSKELETAL: no cyanosis of digits and no clubbing  PSYCH: alert & oriented x 3 with fluent speech NEURO: no focal motor/sensory deficits   All questions were answered. The patient knows to call the clinic with any problems, questions or concerns.   The total time spent in the appointment was 30 minutes encounter with patient  including review of chart and various tests results, discussions about plan of care and coordination of care plan  Dawson Bills, NP 12/14/2023 11:22 AM    Labs Reviewed:  Lab Results  Component Value Date   WBC 11.2 (H)  12/14/2023   HGB 11.3 (L) 12/14/2023   HCT 35.5 (L) 12/14/2023   MCV 78.2 (L) 12/14/2023   PLT 227 12/14/2023   Recent Labs    10/29/23 1234 10/30/23 0721 11/25/23 0833 12/09/23 0905 12/11/23 1554 12/14/23 0536  NA 130*   < > 131* 136 133*  --   K 3.7   < > 3.4* 3.2* 3.8  --   CL 93*   < > 92* 103 101  --   CO2 23   < > 30 23 21*  --   GLUCOSE 125*   < > 121* 180* 115*  --   BUN 6*   < > 10 7* 12  --   CREATININE 0.61   < > 0.50 0.52 0.62 0.43*  CALCIUM 8.8*   < > 9.4 9.0 9.1  --   GFRNONAA >60   < > >60 >60 >60 >60  PROT 7.9   < > 7.0 7.2 7.5  --   ALBUMIN 3.8   < > 3.5 3.5 3.7  --   AST 31   < > 118* 45* 76*  --   ALT 20   < > 91* 42 54*  --   ALKPHOS 216*   < > 337* 253* 241*  --   BILITOT 0.8   < > 0.6 0.4 0.8  --   BILIDIR 0.2  --   --   --   --   --   IBILI 0.6  --   --   --   --   --    < > = values in this interval not displayed.    Studies Reviewed:  IR REMOVAL TUN ACCESS W/ PORT W/O FL MOD SED Result Date: 12/12/2023 CLINICAL DATA:  Metastatic pancreatic carcinoma, status post port catheter placement 11/03/2023 by Dr. Miles Costain. Purulent discharge was noted around the needle access site today. On exam , the port is tender with swelling and redness over the septum. Patient is afebrile. EXAM: EXAM TUNNELED PORT CATHETER REMOVAL TECHNIQUE: Overlying skin prepped with chlorhexidine, draped in usual sterile fashion, infiltrated locally with 1% lidocaine. A small incision was made over the scar from previous placement. Moderate amount of cloudy blood tinged fluid spontaneously discharged from around the port body out the incision. The port was dissected free from the underlying soft tissues and removed intact. Hemostasis was achieved. The port pocket was packed with iodoform gauze and a sterile dressing applied. The patient tolerated the procedure well. COMPLICATIONS: COMPLICATIONS None immediate IMPRESSION: 1. Port pocket infection. Patient will follow-up early next week for  dressing change. Electronically Signed   By: Corlis Leak M.D.   On: 12/12/2023 12:54   DG Chest Port 1 View Result Date: 12/11/2023 CLINICAL DATA:  Questionable sepsis - evaluate for abnormality. Suspected CT port-A-cath infection. No fever. EXAM: PORTABLE CHEST 1 VIEW COMPARISON:  10/26/2023. FINDINGS: Bilateral lung fields are clear. Bilateral costophrenic angles are clear. Stable cardio-mediastinal silhouette. No acute osseous abnormalities. The soft tissues are within normal limits. Right-sided CT Port-A-Cath is seen with its tip overlying the cavoatrial junction region. IMPRESSION: No active disease. Electronically Signed   By: Jules Schick M.D.   On: 12/11/2023 18:00

## 2023-12-15 NOTE — Procedures (Signed)
Ashley Clark is 62 yo female with right internal jugular port site removed for infection, bacteremia.    She is doing well today, with no recurrent symptoms of fever.  Port site was repacked with hydrogel.     We will set appointment for this Friday for another visit/check.    Refer to Dr Kenna Gilbert note for more complete information

## 2023-12-16 ENCOUNTER — Other Ambulatory Visit (HOSPITAL_COMMUNITY): Payer: Self-pay

## 2023-12-16 LAB — CULTURE, BLOOD (ROUTINE X 2): Culture: NO GROWTH

## 2023-12-18 ENCOUNTER — Other Ambulatory Visit (HOSPITAL_COMMUNITY): Payer: Self-pay | Admitting: Interventional Radiology

## 2023-12-18 ENCOUNTER — Ambulatory Visit (HOSPITAL_COMMUNITY)
Admission: RE | Admit: 2023-12-18 | Discharge: 2023-12-18 | Disposition: A | Discharge status: Home / Self Care | Payer: 59 | Source: Ambulatory Visit | Attending: Interventional Radiology | Admitting: Interventional Radiology

## 2023-12-18 ENCOUNTER — Encounter (HOSPITAL_COMMUNITY): Payer: Self-pay

## 2023-12-18 DIAGNOSIS — B999 Unspecified infectious disease: Secondary | ICD-10-CM

## 2023-12-18 DIAGNOSIS — Z4800 Encounter for change or removal of nonsurgical wound dressing: Secondary | ICD-10-CM | POA: Diagnosis present

## 2023-12-18 HISTORY — PX: IR PATIENT EVAL TECH 0-60 MINS: IMG5564

## 2023-12-18 NOTE — Procedures (Signed)
Patient was seen in IR today for [facility] Removal Site Evaluation and Hydrogel application.  Bandage was removed and site was assessed.  Port site was cleaned, irrigated, and old hydrogel was removed.  New hydrogel was placed in port pocket and a bandage was placed over port site. Patient was instructed to continue keeping the site clean and dry and leave the bandage in place, and if she was to develop any symptoms of fever, chills, pain at site, or have any concerns, to please call IR. Patient is scheduled to return to IR on 12/24/2023 for another [facility] Removal Site evaluation and likely another application of Hydrogel.

## 2023-12-23 NOTE — Progress Notes (Signed)
Patient Care Team: [name], [name] as PCP - General (Nurse Practitioner) [name], MD as Consulting Physician (Hematology and Oncology) [name], MD as Consulting Physician (Radiation Oncology) [name], MD as Consulting Physician (General Surgery) [name], MD as Consulting Physician (Hematology and Oncology)  Clinic Day:  12/27/2023  Referring physician: [name], MD  ASSESSMENT & PLAN:   Assessment & Plan: Pancreatic cancer [facility]) \-stage IV with liver mets -diagnosed in 10/2023 -Patient has a history of breast cancer. She presents with dyspnea, chest discomfort, decreased appetite, and weight loss. She also reports new onset back pain. -CT showed a 2.9cm mass in pancreatic neck/body, and multiple liver mets, liver biopsy confirmed adenocarcinoma  -I reviewed the aggressive nature of pancreatic cancer, and the incurable nature of her disease due to diffuse liver metastasis. -she start first line chemo Nalirifox chemotherapy regimen on 11/12, with dose reduction for first cycle due to poor PS -Perform molecular testing on tumor tissue to identify potential targeted therapy options. -She underwent genetic testing and came back negative  -Was hospitalized from 12/13 through 12/14/2023 due to sepsis.  Port-A-Cath was removed and was Clark to be cause of infection.  Finished oral antibiotics on Monday, 12/21/2023. -PICC line placed today, 12/24/2023.  She is able to use this for chemotherapy today. -Today is cycle 2 day 15 of NALIRIFOX   Plan: Labs reviewed  -CBC showing WBC 6.0; Hgb 11.8; Hct 36.2; Plt 216; Anc 3.6 -CMP - K 3.6; glucose 118; BUN 7; Creatinine 0.51; eGFR > 60; Ca 9.7; AST 28; ALT 23; ALKP 218.   Reviewed hospitalization record.  Patient treated for sepsis and released on oral antibiotics which she has completed.  Port was removed.  PICC line placed today. Labs and patient condition are adequate for treatment today.  Proceed with cycle 2 day 15  NALIRIFOX.  Labs/flush, follow-up, and treatment in 2 weeks.  Scheduled 01/06/2024.  The patient understands the plans discussed today and is in agreement with them.  She knows to contact our office if she develops concerns prior to her next appointment.  I provided 25 minutes of face-to-face time during this encounter and > 50% was spent counseling as documented under my assessment and plan.    Carlean Jews, NP  [facility] - A DEPT OF [facility] [address] [place] [address] Dept: [phone] Dept Fax: [phone]   No orders of the defined types were placed in this encounter.     CHIEF COMPLAINT:  CC: Follow-up pancreatic cancer  Current Treatment:  NARILIFOX every 2 weeks  INTERVAL HISTORY:  Ashley Clark is here today for repeat clinical assessment.  Most recently seen by Dr. Bertis Ruddy on 12/11/2023.  Patient's chemotherapy regimen interrupted by neutropenia.  Was recommended to have port removal and IV antibiotics.  It was believed that port was source of infection.  She was hospitalized for 3 days, discharged on oral antibiotics.  Completed them on Monday, 12/21/2023.  She had PICC line placed this morning today she will have cycle 2 day 15 of NARILIFOX. She denies chest pain, chest pressure, or shortness of breath. She denies headaches or visual disturbances. She denies abdominal pain, nausea, vomiting, or changes in bowel or bladder habits.   She denies fevers or chills. She denies pain. Her appetite is good. Her weight has been stable.  I have reviewed the past medical history, past surgical history, social history and family history with  the patient and they are unchanged from previous note.  ALLERGIES:  is allergic to irinotecan liposome.  MEDICATIONS:  Current Outpatient Medications  Medication Sig Dispense Refill   apixaban (ELIQUIS) 5 MG TABS tablet Take 1 tablet (5 mg total) by mouth 2 (two) times  daily. 60 tablet 3   cetirizine (ZYRTEC) 10 MG chewable tablet Chew 10 mg by mouth daily.     lidocaine-prilocaine (EMLA) cream Apply to affected area once (Patient taking differently: Apply 1 Application topically See admin instructions. Apply to affected area for port access) 30 g 3   losartan (COZAAR) 25 MG tablet Take 1 tablet (25 mg total) by mouth daily for kidney protection. 90 tablet 3   ondansetron (ZOFRAN) 8 MG tablet Take 1 tablet (8 mg total) by mouth every 8 (eight) hours as needed for nausea or vomiting. Start on the third day after irinotecan 30 tablet 1   prochlorperazine (COMPAZINE) 10 MG tablet Take 1 tablet (10 mg total) by mouth every 6 (six) hours as needed for nausea or vomiting. 30 tablet 1   potassium chloride SA (KLOR-CON M) 20 MEQ tablet Take 1 tablet (20 mEq total) by mouth daily for 2 days. 2 tablet 0   No current facility-administered medications for this visit.   Facility-Administered Medications Ordered in Other Visits  Medication Dose Route Frequency Provider Last Rate Last Admin   heparin lock flush 100 unit/mL  500 Units Intracatheter Once PRN [name], MD       sodium chloride flush (NS) 0.9 % injection 10 mL  10 mL Intracatheter PRN [name], MD        HISTORY OF PRESENT ILLNESS:   Oncology History  Malignant neoplasm of overlapping sites of left breast in female, estrogen receptor positive (HCC)  08/04/2018 Initial Diagnosis   Screening mammogram detected left breast mass at 9:30 position 1.2 x 1.2 x 1.1 cm.  Closer to the nipple at 9:30 position 0.8 cm lesion span 3.6 cm and a 1.5 cm apart.  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 position 2 cm from nipple, at 1 cm from nipple PASH, lymph node benign, ER 90%, PR 95%, Ki-67 20%, HER-2 negative by IHC 1+, T1CN0 stage Ia AJCC 8   09/29/2018 Surgery   Left lumpectomy: Grade 3 IDC, 1.7 cm, margins negative, lymphovascular invasion  present, 0/2 lymph nodes negative, T1c N0 stage Ia   10/06/2018 Cancer Staging   Staging form: Breast, AJCC 8th Edition - Pathologic: Stage IA (pT1c, pN0(sn), cM0, G3, ER+, PR+, HER2-) - Signed by [name], MD on 10/06/2018   10/22/2018 Oncotype testing   Oncotype score 13: Distant recurrence at 9 years with hormone therapy alone 4%   11/12/2018 - 12/27/2018 Radiation Therapy   Adjuvant radiation therapy   12/27/2018 -  Anti-estrogen oral therapy   Antiestrogen therapy with letrozole 2.5 mg daily   11/30/2023 Genetic Testing   Negative genetic testing on the CancerNext-Expanded+RNAinsight panel.  VUS identified in KIT c.2083G>C and PMS2 c.-1C>A.  The report date is 11/30/2023.  The CancerNext-Expanded gene panel offered by [facility] and includes sequencing, rearrangement, and RNA analysis for the following 76 genes: AIP, ALK, APC, ATM, AXIN2, BAP1, BARD1, BMPR1A, BRCA1, BRCA2, BRIP1, CDC73, CDH1, CDK4, CDKN1B, CDKN2A, CEBPA, CHEK2, CTNNA1, DDX41, DICER1, ETV6, FH, FLCN, GATA2, LZTR1, MAX, MBD4, MEN1, MET, MLH1, MSH2, MSH3, MSH6, MUTYH, NF1, NF2, NTHL1, PALB2, PHOX2B, PMS2, POT1, PRKAR1A, PTCH1,  PTEN, RAD51C, RAD51D, RB1, RET, RUNX1, SDHA, SDHAF2, SDHB, SDHC, SDHD, SMAD4, SMARCA4, SMARCB1, SMARCE1, STK11, SUFU, TMEM127, TP53, TSC1, TSC2, VHL, and WT1 (sequencing and deletion/duplication); EGFR, HOXB13, KIT, MITF, PDGFRA, POLD1, and POLE (sequencing only); EPCAM and GREM1 (deletion/duplication only).    Pancreatic cancer (HCC)  11/03/2023 Initial Diagnosis   Pancreatic cancer (HCC)   11/11/2023 -  Chemotherapy   Patient is on Treatment Plan : PANCREAS NALIRIFOX D1, 15 Q28D     11/30/2023 Genetic Testing   Negative genetic testing on the CancerNext-Expanded+RNAinsight panel.  VUS identified in KIT c.2083G>C and PMS2 c.-1C>A.  The report date is 11/30/2023.  The CancerNext-Expanded gene panel offered by [facility] and includes sequencing, rearrangement, and RNA analysis for the  following 76 genes: AIP, ALK, APC, ATM, AXIN2, BAP1, BARD1, BMPR1A, BRCA1, BRCA2, BRIP1, CDC73, CDH1, CDK4, CDKN1B, CDKN2A, CEBPA, CHEK2, CTNNA1, DDX41, DICER1, ETV6, FH, FLCN, GATA2, LZTR1, MAX, MBD4, MEN1, MET, MLH1, MSH2, MSH3, MSH6, MUTYH, NF1, NF2, NTHL1, PALB2, PHOX2B, PMS2, POT1, PRKAR1A, PTCH1, PTEN, RAD51C, RAD51D, RB1, RET, RUNX1, SDHA, SDHAF2, SDHB, SDHC, SDHD, SMAD4, SMARCA4, SMARCB1, SMARCE1, STK11, SUFU, TMEM127, TP53, TSC1, TSC2, VHL, and WT1 (sequencing and deletion/duplication); EGFR, HOXB13, KIT, MITF, PDGFRA, POLD1, and POLE (sequencing only); EPCAM and GREM1 (deletion/duplication only).        REVIEW OF SYSTEMS:   Constitutional: Denies fevers, chills or abnormal weight loss Eyes: Denies blurriness of vision Ears, nose, mouth, throat, and face: Denies mucositis or sore throat Respiratory: Denies cough, dyspnea or wheezes Cardiovascular: Denies palpitation, chest discomfort or lower extremity swelling Gastrointestinal:  Denies nausea, heartburn or change in bowel habits Skin: Denies abnormal skin rashes Lymphatics: Denies new lymphadenopathy or easy bruising Neurological:Denies numbness, tingling or new weaknesses Behavioral/Psych: Mood is stable, no new changes  All other systems were reviewed with the patient and are negative.   VITALS:   Today's Vitals   12/24/23 1020 12/24/23 1024  BP: (!) 124/59   Pulse: 73   Resp: 16   Temp: 98.1 F (36.7 C)   TempSrc: Temporal   SpO2: 97%   Weight: 178 lb 8 oz (81 kg)   PainSc:  0-No pain   Body mass index is 33.18 kg/m.   Wt Readings from Last 3 Encounters:  12/24/23 178 lb 8 oz (81 kg)  12/11/23 178 lb (80.7 kg)  12/09/23 177 lb 8 oz (80.5 kg)    Body mass index is 33.18 kg/m.  Performance status (ECOG): 1 - Symptomatic but completely ambulatory  PHYSICAL EXAM:   GENERAL:alert, no distress and comfortable SKIN: skin color, texture, turgor are normal, no rashes or significant lesions EYES: normal,  Conjunctiva are pink and non-injected, sclera clear OROPHARYNX:no exudate, no erythema and lips, buccal mucosa, and tongue normal  NECK: supple, thyroid normal size, non-tender, without nodularity LYMPH:  no palpable lymphadenopathy in the cervical, axillary or inguinal LUNGS: clear to auscultation and percussion with normal breathing effort HEART: regular rate & rhythm and no murmurs and no lower extremity edema ABDOMEN:abdomen soft, non-tender and normal bowel sounds Musculoskeletal:no cyanosis of digits and no clubbing  NEURO: alert & oriented x 3 with fluent speech, no focal motor/sensory deficits  LABORATORY DATA:  I have reviewed the data as listed    Component Value Date/Time   NA 139 12/24/2023 0959   NA 136 06/10/2022 1152   K 3.6 12/24/2023 0959   CL 105 12/24/2023 0959   CO2 27 12/24/2023 0959   GLUCOSE 118 (H) 12/24/2023 0959   BUN 7 (L) 12/24/2023 5366  BUN 7 (L) 06/10/2022 1152   CREATININE 0.51 12/24/2023 0959   CALCIUM 9.7 12/24/2023 0959   PROT 6.9 12/24/2023 0959   PROT 7.6 06/10/2022 1152   ALBUMIN 3.9 12/24/2023 0959   ALBUMIN 4.6 06/10/2022 1152   AST 28 12/24/2023 0959   ALT 23 12/24/2023 0959   ALKPHOS 218 (H) 12/24/2023 0959   BILITOT 0.4 12/24/2023 0959   GFRNONAA >60 12/24/2023 0959   Lab Results  Component Value Date   WBC 6.0 12/24/2023   NEUTROABS 3.6 12/24/2023   HGB 11.8 (L) 12/24/2023   HCT 36.2 12/24/2023   MCV 75.9 (L) 12/24/2023   PLT 216 12/24/2023       RADIOGRAPHIC STUDIES: IR PICC PLACEMENT LEFT >5 YRS INC IMG GUIDE Result Date: 12/24/2023 INDICATION: Patient with history of metastatic pancreatic cancer and recent removal of right chest wall port a cath due to bacteremia; central venous access requested for chemotherapy EXAM: LEFT UPPER EXTREMITY PICC LINE PLACEMENT WITH ULTRASOUND AND FLUOROSCOPIC GUIDANCE MEDICATIONS: 4 ml  1% lidocaine to skin /SQ tissue ANESTHESIA/SEDATION: Local anesthetic was administered. FLUOROSCOPY:  Radiation Exposure Index (as provided by the fluoroscopic device): 2 mGy Kerma COMPLICATIONS: None immediate. PROCEDURE: The patient was advised of the possible risks and complications and agreed to undergo the procedure. The patient was then brought to the angiographic suite for the procedure. The left arm was prepped with chlorhexidine, draped in the usual sterile fashion using maximum barrier technique (cap and mask, sterile gown, sterile gloves, large sterile sheet, hand hygiene and cutaneous antisepsis) and infiltrated locally with 1% Lidocaine. Ultrasound demonstrated patency of the left brachial vein, and this was documented with an image. Under real-time ultrasound guidance, this vein was accessed with a 21 gauge micropuncture needle and image documentation was performed. A 0.018 wire was introduced in to the vein. Over this, a 5 [place] dual lumen power-injectable PICC was advanced to the lower SVC/right atrial junction. Fluoroscopy during the procedure and fluoro spot radiograph confirms appropriate catheter position. The catheter was flushed and covered with a sterile dressing. Catheter length: 44 cm IMPRESSION: Successful LEFT arm Power PICC line placement with ultrasound and fluoroscopic guidance. The tip of the catheter is positioned at the superior cavo-atrial junction. The catheter is ready for use. Performed by: Ashley Clark Electronically Signed   By: Ashley Clark M.D.   On: 12/24/2023 09:52   IR PATIENT EVAL TECH 0-60 MINS Result Date: 12/24/2023 Ashley Clark, RT     12/24/2023 10:09 AM Patient was seen in IR today for [facility] Removal Site Evaluation and Hydrogel application.  Bandage was removed and site was assessed.  Port site was cleaned, irrigated, and old hydrogel was removed.  New hydrogel was placed in port pocket and a bandage was placed over port site. Patient was instructed to continue keeping the site clean and dry and leave the bandage in place, and if she was to develop any  symptoms of fever, chills, pain at site, or have any concerns, to please call IR. Patient is scheduled to return to IR on Monday, 12/28/23 for another [facility] Removal Site evaluation and likely another application of Hydrogel.     IR PATIENT EVAL TECH 0-60 MINS Result Date: 12/18/2023 Ashley Clark, RT     12/18/2023 11:08 AM Patient was seen in IR today for [facility] Removal Site Evaluation and Hydrogel application.  Bandage was removed and site was assessed.  Port site was cleaned, irrigated, and old hydrogel was removed.  New hydrogel was placed  in port pocket and a bandage was placed over port site. Patient was instructed to continue keeping the site clean and dry and leave the bandage in place, and if she was to develop any symptoms of fever, chills, pain at site, or have any concerns, to please call IR. Patient is scheduled to return to IR on 12/24/2023 for another [facility] Removal Site evaluation and likely another application of Hydrogel.   IR Radiologist Eval & Mgmt Result Date: 12/15/2023 Arby Barrette     12/15/2023  1:53 PM  Ashley Clark is 62 yo female with right internal jugular port site removed for infection, bacteremia.  She is doing well today, with no recurrent symptoms of fever.  Port site was repacked with hydrogel.   We will set appointment for this Friday for another visit/check.  Refer to Dr Kenna Gilbert note for more complete information  ECHOCARDIOGRAM COMPLETE Result Date: 12/14/2023    ECHOCARDIOGRAM REPORT   Patient Name:   Ashley Clark Date of Exam: 12/14/2023 Medical Rec #:  [id number]        Height:       61.5 in Accession #:    [id number]       Weight:       178.0 lb Date of Birth:  15-Aug-1961        BSA:          1.808 m Patient Age:    62 years         BP:           101/61 mmHg Patient Gender: F                HR:           76 bpm. Exam Location:  Inpatient Procedure: 2D Echo, Cardiac Doppler and Color Doppler Indications:    Bacteremia  History:        Patient has prior  history of Echocardiogram examinations, most                 recent 10/30/2023. Risk Factors:Hypertension, Diabetes and                 Dyslipidemia.  Sonographer:    Vern Claude Referring Phys: [id number] ERIC CHEN IMPRESSIONS  1. Left ventricular ejection fraction, by estimation, is 60 to 65%. The left ventricle has normal function. The left ventricle has no regional wall motion abnormalities. Left ventricular diastolic parameters are consistent with Grade I diastolic dysfunction (impaired relaxation).  2. Right ventricular systolic function is normal. The right ventricular size is normal.  3. The mitral valve is normal in structure. Trivial mitral valve regurgitation. No evidence of mitral stenosis.  4. The aortic valve was not well visualized. Aortic valve regurgitation is not visualized. No aortic stenosis is present.  5. The inferior vena cava is normal in size with greater than 50% respiratory variability, suggesting right atrial pressure of 3 mmHg. Conclusion(s)/Recommendation(s): Very limited images due to poor sound wave transmission. Valves not seen well but look grossly normal. However, if clinical suspicion for endocarditis is high would recommend TEE to further evalaute. FINDINGS  Left Ventricle: Left ventricular ejection fraction, by estimation, is 60 to 65%. The left ventricle has normal function. The left ventricle has no regional wall motion abnormalities. The left ventricular internal cavity size was normal in size. There is  no left ventricular hypertrophy. Left ventricular diastolic parameters are consistent with Grade I diastolic dysfunction (impaired relaxation). Right Ventricle: The right ventricular size is normal.  No increase in right ventricular wall thickness. Right ventricular systolic function is normal. Left Atrium: Left atrial size was normal in size. Right Atrium: Right atrial size was normal in size. Pericardium: There is no evidence of pericardial effusion. Mitral Valve: The mitral valve  is normal in structure. Trivial mitral valve regurgitation. No evidence of mitral valve stenosis. MV peak gradient, 2.3 mmHg. The mean mitral valve gradient is 1.0 mmHg. Tricuspid Valve: The tricuspid valve is normal in structure. Tricuspid valve regurgitation is trivial. No evidence of tricuspid stenosis. Aortic Valve: The aortic valve was not well visualized. Aortic valve regurgitation is not visualized. No aortic stenosis is present. Aortic valve mean gradient measures 4.0 mmHg. Aortic valve peak gradient measures 8.6 mmHg. Aortic valve area, by VTI measures 1.94 cm. Pulmonic Valve: The pulmonic valve was not well visualized. Pulmonic valve regurgitation is not visualized. No evidence of pulmonic stenosis. Aorta: The aortic root is normal in size and structure. Venous: The inferior vena cava is normal in size with greater than 50% respiratory variability, suggesting right atrial pressure of 3 mmHg. IAS/Shunts: No atrial level shunt detected by color flow Doppler.  LEFT VENTRICLE PLAX 2D LVIDd:         3.60 cm      Diastology LVIDs:         2.60 cm      LV e' medial:    7.07 cm/s LV PW:         0.70 cm      LV E/e' medial:  8.3 LV IVS:        0.70 cm      LV e' lateral:   11.50 cm/s LVOT diam:     1.80 cm      LV E/e' lateral: 5.1 LV SV:         41 LV SV Index:   23 LVOT Area:     2.54 cm  LV Volumes (MOD) LV vol d, MOD A2C: 92.9 ml LV vol d, MOD A4C: 115.0 ml LV vol s, MOD A2C: 46.5 ml LV vol s, MOD A4C: 57.3 ml LV SV MOD A2C:     46.4 ml LV SV MOD A4C:     115.0 ml LV SV MOD BP:      53.9 ml RIGHT VENTRICLE             IVC RV Basal diam:  2.90 cm     IVC diam: 1.50 cm RV Mid diam:    2.10 cm RV S prime:     17.50 cm/s TAPSE (M-mode): 2.5 cm LEFT ATRIUM             Index        RIGHT ATRIUM          Index LA diam:        2.40 cm 1.33 cm/m   RA Area:     7.53 cm LA Vol (A2C):   26.3 ml 14.54 ml/m  RA Volume:   10.30 ml 5.70 ml/m LA Vol (A4C):   30.4 ml 16.81 ml/m LA Biplane Vol: 30.1 ml 16.64 ml/m  AORTIC  VALVE                    PULMONIC VALVE AV Area (Vmax):    1.55 cm     PV Vmax:       0.83 m/s AV Area (Vmean):   1.35 cm     PV Peak grad:  2.8 mmHg AV Area (VTI):  1.94 cm AV Vmax:           147.00 cm/s AV Vmean:          92.200 cm/s AV VTI:            0.213 m AV Peak Grad:      8.6 mmHg AV Mean Grad:      4.0 mmHg LVOT Vmax:         89.50 cm/s LVOT Vmean:        49.000 cm/s LVOT VTI:          0.162 m LVOT/AV VTI ratio: 0.76  AORTA Ao Root diam: 2.80 cm Ao Asc diam:  2.90 cm MITRAL VALVE MV Area (PHT): 2.91 cm    SHUNTS MV Area VTI:   1.91 cm    Systemic VTI:  0.16 m MV Peak grad:  2.3 mmHg    Systemic Diam: 1.80 cm MV Mean grad:  1.0 mmHg MV Vmax:       0.76 m/s MV Vmean:      50.0 cm/s MV Decel Time: 261 msec MV E velocity: 58.80 cm/s MV A velocity: 76.50 cm/s MV E/A ratio:  0.77 Arvilla Meres MD Electronically signed by Arvilla Meres MD Signature Date/Time: 12/14/2023/12:11:59 PM    Final    IR REMOVAL TUN ACCESS W/ PORT W/O FL MOD SED Result Date: 12/12/2023 CLINICAL DATA:  Metastatic pancreatic carcinoma, status post port catheter placement 11/03/2023 by Dr. Miles Costain. Purulent discharge was noted around the needle access site today. On exam , the port is tender with swelling and redness over the septum. Patient is afebrile. EXAM: EXAM TUNNELED PORT CATHETER REMOVAL TECHNIQUE: Overlying skin prepped with chlorhexidine, draped in usual sterile fashion, infiltrated locally with 1% lidocaine. A small incision was made over the scar from previous placement. Moderate amount of cloudy blood tinged fluid spontaneously discharged from around the port body out the incision. The port was dissected free from the underlying soft tissues and removed intact. Hemostasis was achieved. The port pocket was packed with iodoform gauze and a sterile dressing applied. The patient tolerated the procedure well. COMPLICATIONS: COMPLICATIONS None immediate IMPRESSION: 1. Port pocket infection. Patient will follow-up  early next week for dressing change. Electronically Signed   By: Corlis Leak M.D.   On: 12/12/2023 12:54   DG Chest Port 1 View Result Date: 12/11/2023 CLINICAL DATA:  Questionable sepsis - evaluate for abnormality. Suspected CT port-A-cath infection. No fever. EXAM: PORTABLE CHEST 1 VIEW COMPARISON:  10/26/2023. FINDINGS: Bilateral lung fields are clear. Bilateral costophrenic angles are clear. Stable cardio-mediastinal silhouette. No acute osseous abnormalities. The soft tissues are within normal limits. Right-sided CT Port-A-Cath is seen with its tip overlying the cavoatrial junction region. IMPRESSION: No active disease. Electronically Signed   By: Jules Schick M.D.   On: 12/11/2023 18:00

## 2023-12-23 NOTE — Assessment & Plan Note (Addendum)
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 She presents with dyspnea, chest discomfort, decreased appetite, and weight loss. She also reports new onset back pain. -CT showed a 2.9cm mass in pancreatic neck/body, and multiple liver mets, liver biopsy confirmed adenocarcinoma  -I reviewed the aggressive nature of pancreatic cancer, and the incurable nature of her disease due to diffuse liver metastasis. -she start first line chemo Nalirifox chemotherapy regimen on 11/12, with dose reduction for first cycle due to poor PS -Perform molecular testing on tumor tissue to identify potential targeted therapy options. -She underwent genetic testing and came back negative  -Was hospitalized from 12/13 through 12/14/2023 due to sepsis.  Port-A-Cath was removed and was found to be cause of infection.  Finished oral antibiotics on Monday, 12/21/2023. -PICC line placed today, 12/24/2023.  She is able to use this for chemotherapy today. -Today is cycle 2 day 15 of NALIRIFOX

## 2023-12-24 ENCOUNTER — Other Ambulatory Visit: Payer: Self-pay | Admitting: Hematology

## 2023-12-24 ENCOUNTER — Inpatient Hospital Stay (HOSPITAL_BASED_OUTPATIENT_CLINIC_OR_DEPARTMENT_OTHER): Admitting: Nurse Practitioner

## 2023-12-24 ENCOUNTER — Ambulatory Visit (HOSPITAL_COMMUNITY)
Admission: RE | Admit: 2023-12-24 | Discharge: 2023-12-24 | Disposition: A | Discharge status: Home / Self Care | Payer: 59 | Source: Ambulatory Visit | Attending: Hematology | Admitting: Hematology

## 2023-12-24 ENCOUNTER — Inpatient Hospital Stay

## 2023-12-24 ENCOUNTER — Other Ambulatory Visit (HOSPITAL_COMMUNITY): Payer: Self-pay | Admitting: Internal Medicine

## 2023-12-24 VITALS — BP 124/59 | HR 73 | Temp 98.1°F | Resp 16 | Wt 178.5 lb

## 2023-12-24 DIAGNOSIS — C251 Malignant neoplasm of body of pancreas: Secondary | ICD-10-CM

## 2023-12-24 DIAGNOSIS — C259 Malignant neoplasm of pancreas, unspecified: Secondary | ICD-10-CM | POA: Diagnosis not present

## 2023-12-24 DIAGNOSIS — T80219D Unspecified infection due to central venous catheter, subsequent encounter: Secondary | ICD-10-CM

## 2023-12-24 DIAGNOSIS — C50812 Malignant neoplasm of overlapping sites of left female breast: Secondary | ICD-10-CM

## 2023-12-24 HISTORY — PX: IR PATIENT EVAL TECH 0-60 MINS: IMG5564

## 2023-12-24 LAB — CBC WITH DIFFERENTIAL (CANCER CENTER ONLY)
Abs Immature Granulocytes: 0.07 10*3/uL (ref 0.00–0.07)
Basophils Absolute: 0 10*3/uL (ref 0.0–0.1)
Basophils Relative: 0 %
Eosinophils Absolute: 0.1 10*3/uL (ref 0.0–0.5)
Eosinophils Relative: 1 %
HCT: 36.2 % (ref 36.0–46.0)
Hemoglobin: 11.8 g/dL — ABNORMAL LOW (ref 12.0–15.0)
Immature Granulocytes: 1 %
Lymphocytes Relative: 26 %
Lymphs Abs: 1.6 10*3/uL (ref 0.7–4.0)
MCH: 24.7 pg — ABNORMAL LOW (ref 26.0–34.0)
MCHC: 32.6 g/dL (ref 30.0–36.0)
MCV: 75.9 fL — ABNORMAL LOW (ref 80.0–100.0)
Monocytes Absolute: 0.7 10*3/uL (ref 0.1–1.0)
Monocytes Relative: 12 %
Neutro Abs: 3.6 10*3/uL (ref 1.7–7.7)
Neutrophils Relative %: 60 %
Platelet Count: 216 10*3/uL (ref 150–400)
RBC: 4.77 MIL/uL (ref 3.87–5.11)
RDW: 18.9 % — ABNORMAL HIGH (ref 11.5–15.5)
WBC Count: 6 10*3/uL (ref 4.0–10.5)
nRBC: 0 % (ref 0.0–0.2)

## 2023-12-24 LAB — CMP (CANCER CENTER ONLY)
ALT: 23 U/L (ref 0–44)
AST: 28 U/L (ref 15–41)
Albumin: 3.9 g/dL (ref 3.5–5.0)
Alkaline Phosphatase: 218 U/L — ABNORMAL HIGH (ref 38–126)
Anion gap: 7 (ref 5–15)
BUN: 7 mg/dL — ABNORMAL LOW (ref 8–23)
CO2: 27 mmol/L (ref 22–32)
Calcium: 9.7 mg/dL (ref 8.9–10.3)
Chloride: 105 mmol/L (ref 98–111)
Creatinine: 0.51 mg/dL (ref 0.44–1.00)
GFR, Estimated: >60 mL/min (ref >60)
Glucose, Bld: 118 mg/dL — ABNORMAL HIGH (ref 70–99)
Potassium: 3.6 mmol/L (ref 3.5–5.1)
Sodium: 139 mmol/L (ref 135–145)
Total Bilirubin: 0.4 mg/dL (ref <1.2)
Total Protein: 6.9 g/dL (ref 6.5–8.1)

## 2023-12-24 MED ORDER — DIPHENHYDRAMINE HCL 50 MG/ML IJ SOLN
25.0000 mg | Freq: Once | INTRAMUSCULAR | Status: AC | PRN
Start: 2023-12-24 — End: 2023-12-24
  Administered 2023-12-24: 25 mg via INTRAVENOUS
  Filled 2023-12-24: qty 1

## 2023-12-24 MED ORDER — LIDOCAINE HCL 1 % IJ SOLN
INTRAMUSCULAR | Status: AC
  Filled 2023-12-24: qty 20

## 2023-12-24 MED ORDER — DEXAMETHASONE SODIUM PHOSPHATE 10 MG/ML IJ SOLN
10.0000 mg | Freq: Once | INTRAMUSCULAR | Status: AC
  Administered 2023-12-24: 10 mg via INTRAVENOUS
  Filled 2023-12-24: qty 1

## 2023-12-24 MED ORDER — ATROPINE SULFATE 1 MG/ML IV SOLN
0.4000 mg | Freq: Once | INTRAVENOUS | Status: AC
Start: 2023-12-24 — End: 2023-12-24
  Filled 2023-12-24: qty 1

## 2023-12-24 MED ORDER — SODIUM CHLORIDE 0.9% FLUSH
3.0000 mL | INTRAVENOUS | Status: DC | PRN
  Administered 2023-12-24: 3 mL

## 2023-12-24 MED ORDER — DEXTROSE 5 % IV SOLN
INTRAVENOUS | Status: DC
Start: 2023-12-24 — End: 2023-12-24

## 2023-12-24 MED ORDER — LIDOCAINE HCL 1 % IJ SOLN: 20.0000 mL | Freq: Once | INTRAMUSCULAR | Status: DC

## 2023-12-24 MED ORDER — DEXTROSE 5 % IV SOLN
400.0000 mg/m2 | Freq: Once | INTRAVENOUS | Status: AC
  Administered 2023-12-24: 788 mg via INTRAVENOUS
  Filled 2023-12-24: qty 17.5

## 2023-12-24 MED ORDER — HEPARIN SOD (PORK) LOCK FLUSH 100 UNIT/ML IV SOLN
500.0000 [IU] | Freq: Once | INTRAVENOUS | Status: DC
Start: 2023-12-24 — End: 2023-12-24

## 2023-12-24 MED ORDER — PALONOSETRON HCL INJECTION 0.25 MG/5ML
0.2500 mg | Freq: Once | INTRAVENOUS | Status: AC
Start: 2023-12-24 — End: 2023-12-24
  Administered 2023-12-24: 0.25 mg via INTRAVENOUS
  Filled 2023-12-24: qty 5

## 2023-12-24 MED ORDER — SODIUM CHLORIDE 0.9 % IV SOLN
2400.0000 mg/m2 | INTRAVENOUS | Status: DC
  Administered 2023-12-24: 5000 mg via INTRAVENOUS
  Filled 2023-12-24: qty 100

## 2023-12-24 MED ORDER — ATROPINE SULFATE 1 MG/ML IV SOLN
0.5000 mg | Freq: Once | INTRAVENOUS | Status: AC | PRN
  Administered 2023-12-24: 0.5 mg via INTRAVENOUS

## 2023-12-24 MED ORDER — SODIUM CHLORIDE 0.9% FLUSH
10.0000 mL | Freq: Once | INTRAVENOUS | Status: AC
  Administered 2023-12-24: 10 mL

## 2023-12-24 MED ORDER — OXALIPLATIN CHEMO INJECTION 100 MG/20ML
60.0000 mg/m2 | Freq: Once | INTRAVENOUS | Status: AC
Start: 2023-12-24 — End: 2023-12-24
  Administered 2023-12-24: 120 mg via INTRAVENOUS
  Filled 2023-12-24: qty 4

## 2023-12-24 MED ORDER — HEPARIN SOD (PORK) LOCK FLUSH 100 UNIT/ML IV SOLN
250.0000 [IU] | Freq: Once | INTRAVENOUS | Status: AC | PRN
  Administered 2023-12-24: 250 [IU]

## 2023-12-24 MED ORDER — FAMOTIDINE IN NACL 20-0.9 MG/50ML-% IV SOLN
20.0000 mg | Freq: Once | INTRAVENOUS | Status: AC
  Administered 2023-12-24: 20 mg via INTRAVENOUS
  Filled 2023-12-24: qty 50

## 2023-12-24 MED ORDER — IRINOTECAN HCL LIPOSOME CHEMO INJECTION 43 MG/10ML
50.0000 mg/m2 | INJECTION | Freq: Once | INTRAVENOUS | Status: AC
  Administered 2023-12-24: 98.9 mg via INTRAVENOUS
  Filled 2023-12-24: qty 23

## 2023-12-24 NOTE — Procedures (Signed)
Left DL brachial vein PICC placed. Length 44 cm. Tip SVC/RA junction. No immediate complications. Medication used- 1% lidocaine to skin/SQ tissue. Ok to use. EBL< 5 cc.

## 2023-12-24 NOTE — Procedures (Signed)
  Patient was seen in IR today for [facility] Removal Site Evaluation and Hydrogel application.  Bandage was removed and site was assessed.  Port site was cleaned, irrigated, and old hydrogel was removed.  New hydrogel was placed in port pocket and a bandage was placed over port site. Patient was instructed to continue keeping the site clean and dry and leave the bandage in place, and if she was to develop any symptoms of fever, chills, pain at site, or have any concerns, to please call IR. Patient is scheduled to return to IR on Monday, 12/28/23 for another [facility] Removal Site evaluation and likely another application of Hydrogel.

## 2023-12-24 NOTE — Patient Instructions (Signed)
[facility] - A DEPT OF [facility]  Discharge Instructions: Thank you for choosing [facility] to provide your oncology and hematology care.   If you have a lab appointment with the Cancer Center, please go directly to the Cancer Center and check in at the registration area.   Wear comfortable clothing and clothing appropriate for easy access to any Portacath or PICC line.   We strive to give you quality time with your provider. You may need to reschedule your appointment if you arrive late (15 or more minutes).  Arriving late affects you and other patients whose appointments are after yours.  Also, if you miss three or more appointments without notifying the office, you may be dismissed from the clinic at the provider's discretion.      For prescription refill requests, have your pharmacy contact our office and allow 72 hours for refills to be completed.    Today you received the following chemotherapy and/or immunotherapy agents: Irinotecan liposome, oxaliplatin, leucovorin, 5FU      To help prevent nausea and vomiting after your treatment, we encourage you to take your nausea medication as directed.  BELOW ARE SYMPTOMS THAT SHOULD BE REPORTED IMMEDIATELY: *FEVER GREATER THAN 100.4 F (38 C) OR HIGHER *CHILLS OR SWEATING *NAUSEA AND VOMITING THAT IS NOT CONTROLLED WITH YOUR NAUSEA MEDICATION *UNUSUAL SHORTNESS OF BREATH *UNUSUAL BRUISING OR BLEEDING *URINARY PROBLEMS (pain or burning when urinating, or frequent urination) *BOWEL PROBLEMS (unusual diarrhea, constipation, pain near the anus) TENDERNESS IN MOUTH AND THROAT WITH OR WITHOUT PRESENCE OF ULCERS (sore throat, sores in mouth, or a toothache) UNUSUAL RASH, SWELLING OR PAIN  UNUSUAL VAGINAL DISCHARGE OR ITCHING   Items with * indicate a potential emergency and should be followed up as soon as possible or go to the Emergency Department if any problems should occur.  Please show the  CHEMOTHERAPY ALERT CARD or IMMUNOTHERAPY ALERT CARD at check-in to the Emergency Department and triage nurse.  Should you have questions after your visit or need to cancel or reschedule your appointment, please contact [facility] - A DEPT OF [facility]  Dept: [phone]  and follow the prompts.  Office hours are 8:00 a.m. to 4:30 p.m. Monday - Friday. Please note that voicemails left after 4:00 p.m. may not be returned until the following business day.  We are closed weekends and major holidays. You have access to a nurse at all times for urgent questions. Please call the main number to the clinic Dept: [phone] and follow the prompts.   For any non-urgent questions, you may also contact your provider using MyChart. We now offer e-Visits for anyone 53 and older to request care online for non-urgent symptoms. For details visit [email or web address].   Also download the MyChart app! Go to the app store, search "MyChart", open the app, select [facility], and log in with your MyChart username and password.

## 2023-12-26 ENCOUNTER — Inpatient Hospital Stay

## 2023-12-26 VITALS — BP 120/85 | HR 76 | Temp 97.5°F | Resp 16

## 2023-12-26 DIAGNOSIS — Z5189 Encounter for other specified aftercare: Secondary | ICD-10-CM | POA: Diagnosis not present

## 2023-12-26 DIAGNOSIS — C251 Malignant neoplasm of body of pancreas: Secondary | ICD-10-CM

## 2023-12-26 DIAGNOSIS — C787 Secondary malignant neoplasm of liver and intrahepatic bile duct: Secondary | ICD-10-CM | POA: Diagnosis not present

## 2023-12-26 DIAGNOSIS — Z5111 Encounter for antineoplastic chemotherapy: Secondary | ICD-10-CM | POA: Diagnosis present

## 2023-12-26 MED ORDER — SODIUM CHLORIDE 0.9% FLUSH
10.0000 mL | INTRAVENOUS | Status: DC | PRN
  Administered 2023-12-26: 10 mL

## 2023-12-26 MED ORDER — PEGFILGRASTIM-CBQV 6 MG/0.6ML ~~LOC~~ SOSY
6.0000 mg | PREFILLED_SYRINGE | Freq: Once | SUBCUTANEOUS | Status: AC
Start: 2023-12-26 — End: 2023-12-26
  Administered 2023-12-26: 6 mg via SUBCUTANEOUS

## 2023-12-26 MED ORDER — HEPARIN SOD (PORK) LOCK FLUSH 100 UNIT/ML IV SOLN
250.0000 [IU] | Freq: Once | INTRAVENOUS | Status: AC | PRN
  Administered 2023-12-26: 250 [IU]

## 2023-12-27 ENCOUNTER — Encounter: Payer: Self-pay | Admitting: Nurse Practitioner

## 2023-12-27 ENCOUNTER — Encounter: Payer: Self-pay | Admitting: Hematology

## 2023-12-28 ENCOUNTER — Other Ambulatory Visit (HOSPITAL_COMMUNITY): Payer: Self-pay | Admitting: Internal Medicine

## 2023-12-28 ENCOUNTER — Encounter (HOSPITAL_COMMUNITY): Payer: Self-pay

## 2023-12-28 ENCOUNTER — Ambulatory Visit (HOSPITAL_COMMUNITY)
Admission: RE | Admit: 2023-12-28 | Discharge: 2023-12-28 | Disposition: A | Discharge status: Home / Self Care | Payer: 59 | Source: Ambulatory Visit | Attending: Interventional Radiology | Admitting: Interventional Radiology

## 2023-12-28 ENCOUNTER — Other Ambulatory Visit (HOSPITAL_COMMUNITY): Payer: Self-pay

## 2023-12-28 DIAGNOSIS — T80219D Unspecified infection due to central venous catheter, subsequent encounter: Secondary | ICD-10-CM | POA: Insufficient documentation

## 2023-12-28 DIAGNOSIS — Z4801 Encounter for change or removal of surgical wound dressing: Secondary | ICD-10-CM | POA: Diagnosis not present

## 2023-12-28 DIAGNOSIS — Y839 Surgical procedure, unspecified as the cause of abnormal reaction of the patient, or of later complication, without mention of misadventure at the time of the procedure: Secondary | ICD-10-CM | POA: Insufficient documentation

## 2023-12-28 HISTORY — PX: IR PATIENT EVAL TECH 0-60 MINS: IMG5564

## 2023-12-28 NOTE — Procedures (Signed)
   Patient was seen in IR today for [facility] Removal Site Evaluation and Hydrogel application.  Bandage was removed and site was assessed.  Port site was cleaned, irrigated, and old hydrogel was removed.  New hydrogel was placed in port pocket and a bandage was placed over port site. Patient was instructed to continue keeping the site clean and dry and leave the bandage in place, and if she was to develop any symptoms of fever, chills, pain at site, or have any concerns, to please call IR. Patient is scheduled to return to IR on Monday, 01/04/2024  for another [facility] Removal Site evaluation and likely another application of Hydrogel.

## 2023-12-29 ENCOUNTER — Ambulatory Visit: Payer: 59 | Admitting: Nurse Practitioner

## 2023-12-29 ENCOUNTER — Other Ambulatory Visit: Payer: 59

## 2023-12-29 ENCOUNTER — Ambulatory Visit: Payer: 59

## 2023-12-31 ENCOUNTER — Encounter: Payer: Self-pay | Admitting: Hematology

## 2024-01-04 ENCOUNTER — Other Ambulatory Visit (HOSPITAL_COMMUNITY): Payer: Self-pay | Admitting: Internal Medicine

## 2024-01-04 ENCOUNTER — Encounter (HOSPITAL_COMMUNITY): Payer: Self-pay

## 2024-01-04 ENCOUNTER — Ambulatory Visit (HOSPITAL_COMMUNITY)
Admission: RE | Admit: 2024-01-04 | Discharge: 2024-01-04 | Disposition: A | Discharge status: Home / Self Care | Payer: 59 | Source: Ambulatory Visit | Attending: Interventional Radiology | Admitting: Interventional Radiology

## 2024-01-04 DIAGNOSIS — T80219D Unspecified infection due to central venous catheter, subsequent encounter: Secondary | ICD-10-CM

## 2024-01-04 DIAGNOSIS — Z4801 Encounter for change or removal of surgical wound dressing: Secondary | ICD-10-CM | POA: Diagnosis not present

## 2024-01-04 DIAGNOSIS — Y839 Surgical procedure, unspecified as the cause of abnormal reaction of the patient, or of later complication, without mention of misadventure at the time of the procedure: Secondary | ICD-10-CM | POA: Insufficient documentation

## 2024-01-04 HISTORY — PX: IR PATIENT EVAL TECH 0-60 MINS: IMG5564

## 2024-01-04 NOTE — Assessment & Plan Note (Addendum)
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 She presents with dyspnea, chest discomfort, decreased appetite, and weight loss. She also reports new onset back pain. -CT showed a 2.9cm mass in pancreatic neck/body, and multiple liver mets, liver biopsy confirmed adenocarcinoma  -I reviewed the aggressive nature of pancreatic cancer, and the incurable nature of her disease due to diffuse liver metastasis. -she start first line chemo Nalirifox chemotherapy regimen on 11/12, with dose reduction for first cycle due to poor PS -Perform molecular testing on tumor tissue to identify potential targeted therapy options. -She underwent genetic testing and came back negative  -Was hospitalized from 12/13 through 12/14/2023 due to sepsis.  Port-A-Cath was removed and was found to be cause of infection.  Finished oral antibiotics on Monday, 12/21/2023. -PICC line placed today, 12/24/2023.  She is able to use this for chemotherapy today. -Today is cycle 3 day 1 of NALIRIFOX -She continues to have wound care from prior port via IR.  Wound is doing well and nearly healed.

## 2024-01-04 NOTE — Progress Notes (Signed)
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 She presents with dyspnea, chest discomfort, decreased appetite, and weight loss. She also reports new onset back pain. -CT showed a 2.9cm mass in pancreatic neck/body, and multiple liver mets, liver biopsy confirmed adenocarcinoma  -I reviewed the aggressive nature of pancreatic cancer, and the incurable nature of her disease due to diffuse liver metastasis. -she start first line chemo Nalirifox chemotherapy regimen on 11/12, with dose reduction for first cycle due to poor PS -Perform molecular testing on tumor tissue to identify potential targeted therapy options. -She underwent genetic testing and came back negative  -Was hospitalized from 12/13 through 12/14/2023 due to sepsis.  Port-A-Cath was removed and was found to be cause of infection.  Finished oral antibiotics on Monday, 12/21/2023. -PICC line placed today, 12/24/2023.  She is able to use this for chemotherapy today. -Today is cycle 3 day 1 of NALIRIFOX   Plan: Labs reviewed  -CBC showing WBC 10.1; Hgb 11.5; Hct 35.7; Plt 266; Anc 6.8 -CMP - K 3.8; glucose 126; BUN 9; Creatinine 0.51; eGFR > 60; Ca 9.4; AST 21; ALT 14; ALKP 206.   Patient condition and labs are stable for treatment today.  Proceed with cycle 3 day 1 NALIRIFOX. Continue with port wound care via IR as scheduled. Labs, follow-up, and treatment as currently scheduled.  The patient understands the plans discussed  today and is in agreement with them.  She knows to contact our office if she develops concerns prior to her next appointment.  I provided 20 minutes of face-to-face time during this encounter and > 50% was spent counseling as documented under my assessment and plan.    Powell FORBES Lessen, NP  [facility] - A DEPT OF [facility] [address] [place] [address] Dept: [phone] Dept Fax: [phone]   No orders of the defined types were placed in this encounter.     CHIEF COMPLAINT:  CC: Pancreatic  Current Treatment: Pancreatic NALIRIFOX  INTERVAL HISTORY:  Ashley Clark is here today for repeat clinical assessment.  She was last seen by myself on 12/24/2023.  Has PICC line placed for treatments.  Had port removed due to infection which caused sepsis.  Today is cycle 3 day 1 of NALIRIFOX. She denies chest pain, chest pressure, or shortness of breath. She denies headaches or visual disturbances. She denies abdominal pain, nausea, vomiting, or changes in bowel or bladder habits.  She reports some intermittent constipation.  She is taking MiraLAX daily which helps.  She denies neuropathy.  She denies fevers or chills. She denies pain. Her appetite is good. Her weight has been stable.  I have reviewed the past medical history, past surgical history, social history and family history with the patient and they are unchanged from previous note.  ALLERGIES:  is allergic to irinotecan  liposome.  MEDICATIONS:  Current Outpatient Medications  Medication Sig Dispense Refill   apixaban  (ELIQUIS ) 5 MG TABS tablet Take 1 tablet (5 mg total) by mouth 2 (  two) times daily. 60 tablet 3   cetirizine (ZYRTEC) 10 MG chewable tablet Chew 10 mg by mouth daily.     lidocaine -prilocaine  (EMLA ) cream Apply to affected area once (Patient taking differently: Apply 1 Application topically See admin instructions. Apply to affected area for port  access) 30 g 3   losartan  (COZAAR ) 25 MG tablet Take 1 tablet (25 mg total) by mouth daily for kidney protection. 90 tablet 3   ondansetron  (ZOFRAN ) 8 MG tablet Take 1 tablet (8 mg total) by mouth every 8 (eight) hours as needed for nausea or vomiting. Start on the third day after irinotecan  30 tablet 1   prochlorperazine  (COMPAZINE ) 10 MG tablet Take 1 tablet (10 mg total) by mouth every 6 (six) hours as needed for nausea or vomiting. 30 tablet 1   potassium chloride  SA (KLOR-CON  M) 20 MEQ tablet Take 1 tablet (20 mEq total) by mouth daily for 2 days. 2 tablet 0   No current facility-administered medications for this visit.   Facility-Administered Medications Ordered in Other Visits  Medication Dose Route Frequency Provider Last Rate Last Admin   heparin  lock flush 100 unit/mL  500 Units Intracatheter Once PRN [name], MD       sodium chloride  flush (NS) 0.9 % injection 10 mL  10 mL Intracatheter PRN [name], MD        HISTORY OF PRESENT ILLNESS:   Oncology History  Malignant neoplasm of overlapping sites of left breast in female, estrogen receptor positive (HCC)  08/04/2018 Initial Diagnosis   Screening mammogram detected left breast mass at 9:30 position 1.2 x 1.2 x 1.1 cm.  Closer to the nipple at 9:30 position 0.8 cm lesion span 3.6 cm and a 1.5 cm apart.  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 position 2 cm from nipple, at 1 cm from nipple PASH, lymph node benign, ER 90%, PR 95%, Ki-67 20%, HER-2 negative by IHC 1+, T1CN0 stage Ia AJCC 8   09/29/2018 Surgery   Left lumpectomy: Grade 3 IDC, 1.7 cm, margins negative, lymphovascular invasion present, 0/2 lymph nodes negative, T1c N0 stage Ia   10/06/2018 Cancer Staging   Staging form: Breast, AJCC 8th Edition - Pathologic: Stage IA (pT1c, pN0(sn), cM0, G3, ER+, PR+, HER2-) - Signed by [name], MD on 10/06/2018   10/22/2018 Oncotype testing   Oncotype  score 13: Distant recurrence at 9 years with hormone therapy alone 4%   11/12/2018 - 12/27/2018 Radiation Therapy   Adjuvant radiation therapy   12/27/2018 -  Anti-estrogen oral therapy   Antiestrogen therapy with letrozole  2.5 mg daily   11/30/2023 Genetic Testing   Negative genetic testing on the CancerNext-Expanded+RNAinsight panel.  VUS identified in KIT c.2083G>C and PMS2 c.-1C>A.  The report date is 11/30/2023.  The CancerNext-Expanded gene panel offered by [facility] and includes sequencing, rearrangement, and RNA analysis for the following 76 genes: AIP, ALK, APC, ATM, AXIN2, BAP1, BARD1, BMPR1A, BRCA1, BRCA2, BRIP1, CDC73, CDH1, CDK4, CDKN1B, CDKN2A, CEBPA, CHEK2, CTNNA1, DDX41, DICER1, ETV6, FH, FLCN, GATA2, LZTR1, MAX, MBD4, MEN1, MET, MLH1, MSH2, MSH3, MSH6, MUTYH, NF1, NF2, NTHL1, PALB2, PHOX2B, PMS2, POT1, PRKAR1A, PTCH1, PTEN, RAD51C, RAD51D, RB1, RET, RUNX1, SDHA, SDHAF2, SDHB, SDHC, SDHD, SMAD4, SMARCA4, SMARCB1, SMARCE1, STK11, SUFU, TMEM127, TP53, TSC1, TSC2, VHL, and WT1 (sequencing and deletion/duplication); EGFR, HOXB13, KIT, MITF, PDGFRA, POLD1, and POLE (sequencing only); EPCAM and GREM1 (deletion/duplication only).  Pancreatic cancer (HCC)  11/03/2023 Initial Diagnosis   Pancreatic cancer (HCC)   11/11/2023 -  Chemotherapy   Patient is on Treatment Plan : PANCREAS NALIRIFOX D1, 15 Q28D     11/30/2023 Genetic Testing   Negative genetic testing on the CancerNext-Expanded+RNAinsight panel.  VUS identified in KIT c.2083G>C and PMS2 c.-1C>A.  The report date is 11/30/2023.  The CancerNext-Expanded gene panel offered by [facility] and includes sequencing, rearrangement, and RNA analysis for the following 76 genes: AIP, ALK, APC, ATM, AXIN2, BAP1, BARD1, BMPR1A, BRCA1, BRCA2, BRIP1, CDC73, CDH1, CDK4, CDKN1B, CDKN2A, CEBPA, CHEK2, CTNNA1, DDX41, DICER1, ETV6, FH, FLCN, GATA2, LZTR1, MAX, MBD4, MEN1, MET, MLH1, MSH2, MSH3, MSH6, MUTYH, NF1, NF2, NTHL1, PALB2, PHOX2B,  PMS2, POT1, PRKAR1A, PTCH1, PTEN, RAD51C, RAD51D, RB1, RET, RUNX1, SDHA, SDHAF2, SDHB, SDHC, SDHD, SMAD4, SMARCA4, SMARCB1, SMARCE1, STK11, SUFU, TMEM127, TP53, TSC1, TSC2, VHL, and WT1 (sequencing and deletion/duplication); EGFR, HOXB13, KIT, MITF, PDGFRA, POLD1, and POLE (sequencing only); EPCAM and GREM1 (deletion/duplication only).        REVIEW OF SYSTEMS:   Constitutional: Denies fevers, chills or abnormal weight loss Eyes: Denies blurriness of vision Ears, nose, mouth, throat, and face: Denies mucositis or sore throat Respiratory: Denies cough, dyspnea or wheezes Cardiovascular: Denies palpitation, chest discomfort or lower extremity swelling Gastrointestinal:  Denies nausea, heartburn or change in bowel habits.  Has constipation which is intermittent.  Takes MiraLAX which helps. Skin: Denies abnormal skin rashes Lymphatics: Denies new lymphadenopathy or easy bruising Neurological:Denies numbness, tingling or new weaknesses Behavioral/Psych: Mood is stable, no new changes  All other systems were reviewed with the patient and are negative.   VITALS:   Today's Vitals   01/06/24 1025 01/06/24 1033  BP: 107/76   Pulse: 71   Resp: 16   Temp: 97.6 F (36.4 C)   TempSrc: Temporal   SpO2: 98%   Weight: 177 lb 4.8 oz (80.4 kg)   Height: 5' 1.5 (1.562 m)   PainSc:  0-No pain   Body mass index is 32.96 kg/m.   Wt Readings from Last 3 Encounters:  01/06/24 177 lb 4.8 oz (80.4 kg)  12/24/23 178 lb 8 oz (81 kg)  12/11/23 178 lb (80.7 kg)    Body mass index is 32.96 kg/m.  Performance status (ECOG): 1 - Symptomatic but completely ambulatory  PHYSICAL EXAM:   GENERAL:alert, no distress and comfortable SKIN: skin color, texture, turgor are normal, no rashes or significant lesions EYES: normal, Conjunctiva are pink and non-injected, sclera clear OROPHARYNX:no exudate, no erythema and lips, buccal mucosa, and tongue normal  NECK: supple, thyroid normal size, non-tender,  without nodularity LYMPH:  no palpable lymphadenopathy in the cervical, axillary or inguinal LUNGS: clear to auscultation and percussion with normal breathing effort HEART: regular rate & rhythm and no murmurs and no lower extremity edema ABDOMEN:abdomen soft, non-tender and normal bowel sounds Musculoskeletal:no cyanosis of digits and no clubbing  NEURO: alert & oriented x 3 with fluent speech, no focal motor/sensory deficits  LABORATORY DATA:  I have reviewed the data as listed   Lab Results  Component Value Date   WBC 10.1 01/06/2024   NEUTROABS 6.8 01/06/2024   HGB 11.5 (L) 01/06/2024   HCT 35.7 (L) 01/06/2024   MCV 77.6 (L) 01/06/2024   PLT 266 01/06/2024      RADIOGRAPHIC STUDIES: IR PATIENT EVAL TECH 0-60 MINS Result Date: 01/04/2024 Katha Harlene ORN, RT     01/04/2024 10:10 AM Patient was seen in IR today for [employer]  Evaluation and Hydrogel application.  Bandage was removed and site was assessed.  Port site was cleaned, irrigated, and old hydrogel was removed.  New hydrogel was placed in port pocket and a bandage was placed over port site. Patient was instructed to continue keeping the site clean and dry and leave the bandage in place, and if she was to develop any symptoms of fever, chills, pain at site, or have any concerns, to please call IR. Patient is scheduled to return to IR on Wednesday, 01/13/2024  for another [facility] Removal Site evaluation and likely another application of Hydrogel.   IR PATIENT EVAL TECH 0-60 MINS Result Date: 12/28/2023 Baldwin Andrea POUR, RT     12/28/2023  9:25 AM Patient was seen in IR today for [facility] Removal Site Evaluation and Hydrogel application.  Bandage was removed and site was assessed.  Port site was cleaned, irrigated, and old hydrogel was removed.  New hydrogel was placed in port pocket and a bandage was placed over port site. Patient was instructed to continue keeping the site clean and dry and leave the bandage in place, and if she  was to develop any symptoms of fever, chills, pain at site, or have any concerns, to please call IR. Patient is scheduled to return to IR on Monday, 01/04/2024  for another [facility] Removal Site evaluation and likely another application of Hydrogel.      IR PICC PLACEMENT LEFT >5 YRS INC IMG GUIDE Result Date: 12/24/2023 INDICATION: Patient with history of metastatic pancreatic cancer and recent removal of right chest wall port a cath due to bacteremia; central venous access requested for chemotherapy EXAM: LEFT UPPER EXTREMITY PICC LINE PLACEMENT WITH ULTRASOUND AND FLUOROSCOPIC GUIDANCE MEDICATIONS: 4 ml  1% lidocaine  to skin /SQ tissue ANESTHESIA/SEDATION: Local anesthetic was administered. FLUOROSCOPY: Radiation Exposure Index (as provided by the fluoroscopic device): 2 mGy Kerma COMPLICATIONS: None immediate. PROCEDURE: The patient was advised of the possible risks and complications and agreed to undergo the procedure. The patient was then brought to the angiographic suite for the procedure. The left arm was prepped with chlorhexidine , draped in the usual sterile fashion using maximum barrier technique (cap and mask, sterile gown, sterile gloves, large sterile sheet, hand hygiene and cutaneous antisepsis) and infiltrated locally with 1% Lidocaine . Ultrasound demonstrated patency of the left brachial vein, and this was documented with an image. Under real-time ultrasound guidance, this vein was accessed with a 21 gauge micropuncture needle and image documentation was performed. A 0.018 wire was introduced in to the vein. Over this, a 5 French dual lumen power-injectable PICC was advanced to the lower SVC/right atrial junction. Fluoroscopy during the procedure and fluoro spot radiograph confirms appropriate catheter position. The catheter was flushed and covered with a sterile dressing. Catheter length: 44 cm IMPRESSION: Successful LEFT arm Power PICC line placement with ultrasound and fluoroscopic guidance. The  tip of the catheter is positioned at the superior cavo-atrial junction. The catheter is ready for use. Performed by: Franky Kelsie RIGGERS Electronically Signed   By: Thom Hall M.D.   On: 12/24/2023 09:52   IR PATIENT EVAL TECH 0-60 MINS Result Date: 12/24/2023 Baldwin Andrea POUR, RT     12/24/2023 10:09 AM Patient was seen in IR today for [facility] Removal Site Evaluation and Hydrogel application.  Bandage was removed and site was assessed.  Port site was cleaned, irrigated, and old hydrogel was removed.  New hydrogel was placed in port pocket and a bandage was placed over port site. Patient was instructed  to continue keeping the site clean and dry and leave the bandage in place, and if she was to develop any symptoms of fever, chills, pain at site, or have any concerns, to please call IR. Patient is scheduled to return to IR on Monday, 12/28/23 for another [facility] Removal Site evaluation and likely another application of Hydrogel.     IR PATIENT EVAL TECH 0-60 MINS Result Date: 12/18/2023 Katha Harlene ORN, RT     12/18/2023 11:08 AM Patient was seen in IR today for [facility] Removal Site Evaluation and Hydrogel application.  Bandage was removed and site was assessed.  Port site was cleaned, irrigated, and old hydrogel was removed.  New hydrogel was placed in port pocket and a bandage was placed over port site. Patient was instructed to continue keeping the site clean and dry and leave the bandage in place, and if she was to develop any symptoms of fever, chills, pain at site, or have any concerns, to please call IR. Patient is scheduled to return to IR on 12/24/2023 for another [facility] Removal Site evaluation and likely another application of Hydrogel.   IR Radiologist Eval & Mgmt Result Date: 12/15/2023 Crawford Dorothyann POUR     12/15/2023  1:53 PM  Ms Omura is 63 yo female with right internal jugular port site removed for infection, bacteremia.  She is doing well today, with no recurrent symptoms of fever.  Port site  was repacked with hydrogel.   We will set appointment for this Friday for another visit/check.  Refer to Dr Madalynn note for more complete information  ECHOCARDIOGRAM COMPLETE Result Date: 12/14/2023    ECHOCARDIOGRAM REPORT   Patient Name:   Ashley Clark Date of Exam: 12/14/2023 Medical Rec #:  [id number]        Height:       61.5 in Accession #:    [id number]       Weight:       178.0 lb Date of Birth:  08-13-1961        BSA:          1.808 m Patient Age:    62 years         BP:           101/61 mmHg Patient Gender: F                HR:           76 bpm. Exam Location:  Inpatient Procedure: 2D Echo, Cardiac Doppler and Color Doppler Indications:    Bacteremia  History:        Patient has prior history of Echocardiogram examinations, most                 recent 10/30/2023. Risk Factors:Hypertension, Diabetes and                 Dyslipidemia.  Sonographer:    Sabrina Gentry Referring Phys: [id number] ERIC CHEN IMPRESSIONS  1. Left ventricular ejection fraction, by estimation, is 60 to 65%. The left ventricle has normal function. The left ventricle has no regional wall motion abnormalities. Left ventricular diastolic parameters are consistent with Grade I diastolic dysfunction (impaired relaxation).  2. Right ventricular systolic function is normal. The right ventricular size is normal.  3. The mitral valve is normal in structure. Trivial mitral valve regurgitation. No evidence of mitral stenosis.  4. The aortic valve was not well visualized. Aortic valve regurgitation is not visualized. No aortic stenosis is present.  5.  The inferior vena cava is normal in size with greater than 50% respiratory variability, suggesting right atrial pressure of 3 mmHg. Conclusion(s)/Recommendation(s): Very limited images due to poor sound wave transmission. Valves not seen well but look grossly normal. However, if clinical suspicion for endocarditis is high would recommend TEE to further evalaute. FINDINGS  Left Ventricle: Left  ventricular ejection fraction, by estimation, is 60 to 65%. The left ventricle has normal function. The left ventricle has no regional wall motion abnormalities. The left ventricular internal cavity size was normal in size. There is  no left ventricular hypertrophy. Left ventricular diastolic parameters are consistent with Grade I diastolic dysfunction (impaired relaxation). Right Ventricle: The right ventricular size is normal. No increase in right ventricular wall thickness. Right ventricular systolic function is normal. Left Atrium: Left atrial size was normal in size. Right Atrium: Right atrial size was normal in size. Pericardium: There is no evidence of pericardial effusion. Mitral Valve: The mitral valve is normal in structure. Trivial mitral valve regurgitation. No evidence of mitral valve stenosis. MV peak gradient, 2.3 mmHg. The mean mitral valve gradient is 1.0 mmHg. Tricuspid Valve: The tricuspid valve is normal in structure. Tricuspid valve regurgitation is trivial. No evidence of tricuspid stenosis. Aortic Valve: The aortic valve was not well visualized. Aortic valve regurgitation is not visualized. No aortic stenosis is present. Aortic valve mean gradient measures 4.0 mmHg. Aortic valve peak gradient measures 8.6 mmHg. Aortic valve area, by VTI measures 1.94 cm. Pulmonic Valve: The pulmonic valve was not well visualized. Pulmonic valve regurgitation is not visualized. No evidence of pulmonic stenosis. Aorta: The aortic root is normal in size and structure. Venous: The inferior vena cava is normal in size with greater than 50% respiratory variability, suggesting right atrial pressure of 3 mmHg. IAS/Shunts: No atrial level shunt detected by color flow Doppler.  LEFT VENTRICLE PLAX 2D LVIDd:         3.60 cm      Diastology LVIDs:         2.60 cm      LV e' medial:    7.07 cm/s LV PW:         0.70 cm      LV E/e' medial:  8.3 LV IVS:        0.70 cm      LV e' lateral:   11.50 cm/s LVOT diam:     1.80 cm       LV E/e' lateral: 5.1 LV SV:         41 LV SV Index:   23 LVOT Area:     2.54 cm  LV Volumes (MOD) LV vol d, MOD A2C: 92.9 ml LV vol d, MOD A4C: 115.0 ml LV vol s, MOD A2C: 46.5 ml LV vol s, MOD A4C: 57.3 ml LV SV MOD A2C:     46.4 ml LV SV MOD A4C:     115.0 ml LV SV MOD BP:      53.9 ml RIGHT VENTRICLE             IVC RV Basal diam:  2.90 cm     IVC diam: 1.50 cm RV Mid diam:    2.10 cm RV S prime:     17.50 cm/s TAPSE (M-mode): 2.5 cm LEFT ATRIUM             Index        RIGHT ATRIUM          Index LA diam:  2.40 cm 1.33 cm/m   RA Area:     7.53 cm LA Vol (A2C):   26.3 ml 14.54 ml/m  RA Volume:   10.30 ml 5.70 ml/m LA Vol (A4C):   30.4 ml 16.81 ml/m LA Biplane Vol: 30.1 ml 16.64 ml/m  AORTIC VALVE                    PULMONIC VALVE AV Area (Vmax):    1.55 cm     PV Vmax:       0.83 m/s AV Area (Vmean):   1.35 cm     PV Peak grad:  2.8 mmHg AV Area (VTI):     1.94 cm AV Vmax:           147.00 cm/s AV Vmean:          92.200 cm/s AV VTI:            0.213 m AV Peak Grad:      8.6 mmHg AV Mean Grad:      4.0 mmHg LVOT Vmax:         89.50 cm/s LVOT Vmean:        49.000 cm/s LVOT VTI:          0.162 m LVOT/AV VTI ratio: 0.76  AORTA Ao Root diam: 2.80 cm Ao Asc diam:  2.90 cm MITRAL VALVE MV Area (PHT): 2.91 cm    SHUNTS MV Area VTI:   1.91 cm    Systemic VTI:  0.16 m MV Peak grad:  2.3 mmHg    Systemic Diam: 1.80 cm MV Mean grad:  1.0 mmHg MV Vmax:       0.76 m/s MV Vmean:      50.0 cm/s MV Decel Time: 261 msec MV E velocity: 58.80 cm/s MV A velocity: 76.50 cm/s MV E/A ratio:  0.77 Toribio Fuel MD Electronically signed by Toribio Fuel MD Signature Date/Time: 12/14/2023/12:11:59 PM    Final    IR REMOVAL TUN ACCESS W/ PORT W/O FL MOD SED Result Date: 12/12/2023 CLINICAL DATA:  Metastatic pancreatic carcinoma, status post port catheter placement 11/03/2023 by Dr. Vanice. Purulent discharge was noted around the needle access site today. On exam , the port is tender with swelling and redness  over the septum. Patient is afebrile. EXAM: EXAM TUNNELED PORT CATHETER REMOVAL TECHNIQUE: Overlying skin prepped with chlorhexidine , draped in usual sterile fashion, infiltrated locally with 1% lidocaine . A small incision was made over the scar from previous placement. Moderate amount of cloudy blood tinged fluid spontaneously discharged from around the port body out the incision. The port was dissected free from the underlying soft tissues and removed intact. Hemostasis was achieved. The port pocket was packed with iodoform gauze and a sterile dressing applied. The patient tolerated the procedure well. COMPLICATIONS: COMPLICATIONS None immediate IMPRESSION: 1. Port pocket infection. Patient will follow-up early next week for dressing change. Electronically Signed   By: JONETTA Faes M.D.   On: 12/12/2023 12:54   DG Chest Port 1 View Result Date: 12/11/2023 CLINICAL DATA:  Questionable sepsis - evaluate for abnormality. Suspected CT port-A-cath infection. No fever. EXAM: PORTABLE CHEST 1 VIEW COMPARISON:  10/26/2023. FINDINGS: Bilateral lung fields are clear. Bilateral costophrenic angles are clear. Stable cardio-mediastinal silhouette. No acute osseous abnormalities. The soft tissues are within normal limits. Right-sided CT Port-A-Cath is seen with its tip overlying the cavoatrial junction region. IMPRESSION: No active disease. Electronically Signed   By: Ree Molt M.D.   On: 12/11/2023  18:00   

## 2024-01-04 NOTE — Procedures (Signed)
 Patient was seen in IR today for [facility] Removal Site Evaluation and Hydrogel application.  Bandage was removed and site was assessed.  Port site was cleaned, irrigated, and old hydrogel was removed.  New hydrogel was placed in port pocket and a bandage was placed over port site. Patient was instructed to continue keeping the site clean and dry and leave the bandage in place, and if she was to develop any symptoms of fever, chills, pain at site, or have any concerns, to please call IR. Patient is scheduled to return to IR on Wednesday, 01/13/2024  for another [facility] Removal Site evaluation and likely another application of Hydrogel.

## 2024-01-05 ENCOUNTER — Other Ambulatory Visit: Payer: 59

## 2024-01-05 ENCOUNTER — Ambulatory Visit: Payer: 59 | Admitting: Nurse Practitioner

## 2024-01-05 ENCOUNTER — Ambulatory Visit: Payer: 59

## 2024-01-06 ENCOUNTER — Inpatient Hospital Stay: Payer: 59

## 2024-01-06 ENCOUNTER — Inpatient Hospital Stay: Payer: 59 | Admitting: Dietician

## 2024-01-06 ENCOUNTER — Inpatient Hospital Stay: Payer: 59 | Attending: Hematology | Admitting: Nurse Practitioner

## 2024-01-06 ENCOUNTER — Encounter: Payer: Self-pay | Admitting: Hematology

## 2024-01-06 VITALS — BP 107/76 | HR 71 | Temp 97.6°F | Resp 16 | Ht 61.5 in | Wt 177.3 lb

## 2024-01-06 DIAGNOSIS — Z5189 Encounter for other specified aftercare: Secondary | ICD-10-CM | POA: Insufficient documentation

## 2024-01-06 DIAGNOSIS — C251 Malignant neoplasm of body of pancreas: Secondary | ICD-10-CM | POA: Insufficient documentation

## 2024-01-06 DIAGNOSIS — Z5111 Encounter for antineoplastic chemotherapy: Secondary | ICD-10-CM | POA: Insufficient documentation

## 2024-01-06 DIAGNOSIS — C50812 Malignant neoplasm of overlapping sites of left female breast: Secondary | ICD-10-CM

## 2024-01-06 DIAGNOSIS — C787 Secondary malignant neoplasm of liver and intrahepatic bile duct: Secondary | ICD-10-CM | POA: Insufficient documentation

## 2024-01-06 DIAGNOSIS — C259 Malignant neoplasm of pancreas, unspecified: Secondary | ICD-10-CM | POA: Diagnosis not present

## 2024-01-06 LAB — CBC WITH DIFFERENTIAL (CANCER CENTER ONLY)
Abs Immature Granulocytes: 0.23 10*3/uL — ABNORMAL HIGH (ref 0.00–0.07)
Basophils Absolute: 0 10*3/uL (ref 0.0–0.1)
Basophils Relative: 0 %
Eosinophils Absolute: 0.1 10*3/uL (ref 0.0–0.5)
Eosinophils Relative: 1 %
HCT: 35.7 % — ABNORMAL LOW (ref 36.0–46.0)
Hemoglobin: 11.5 g/dL — ABNORMAL LOW (ref 12.0–15.0)
Immature Granulocytes: 2 %
Lymphocytes Relative: 18 %
Lymphs Abs: 1.8 10*3/uL (ref 0.7–4.0)
MCH: 25 pg — ABNORMAL LOW (ref 26.0–34.0)
MCHC: 32.2 g/dL (ref 30.0–36.0)
MCV: 77.6 fL — ABNORMAL LOW (ref 80.0–100.0)
Monocytes Absolute: 1.1 10*3/uL — ABNORMAL HIGH (ref 0.1–1.0)
Monocytes Relative: 11 %
Neutro Abs: 6.8 10*3/uL (ref 1.7–7.7)
Neutrophils Relative %: 68 %
Platelet Count: 266 10*3/uL (ref 150–400)
RBC: 4.6 MIL/uL (ref 3.87–5.11)
RDW: 20.5 % — ABNORMAL HIGH (ref 11.5–15.5)
WBC Count: 10.1 10*3/uL (ref 4.0–10.5)
nRBC: 0 % (ref 0.0–0.2)

## 2024-01-06 LAB — CMP (CANCER CENTER ONLY)
ALT: 14 U/L (ref 0–44)
AST: 21 U/L (ref 15–41)
Albumin: 3.9 g/dL (ref 3.5–5.0)
Alkaline Phosphatase: 206 U/L — ABNORMAL HIGH (ref 38–126)
Anion gap: 7 (ref 5–15)
BUN: 9 mg/dL (ref 8–23)
CO2: 27 mmol/L (ref 22–32)
Calcium: 9.4 mg/dL (ref 8.9–10.3)
Chloride: 104 mmol/L (ref 98–111)
Creatinine: 0.51 mg/dL (ref 0.44–1.00)
GFR, Estimated: >60 mL/min (ref >60)
Glucose, Bld: 126 mg/dL — ABNORMAL HIGH (ref 70–99)
Potassium: 3.8 mmol/L (ref 3.5–5.1)
Sodium: 138 mmol/L (ref 135–145)
Total Bilirubin: 0.3 mg/dL (ref 0.0–1.2)
Total Protein: 6.9 g/dL (ref 6.5–8.1)

## 2024-01-06 MED ORDER — SODIUM CHLORIDE 0.9% FLUSH
10.0000 mL | Freq: Once | INTRAVENOUS | Status: AC
  Administered 2024-01-06: 10 mL

## 2024-01-06 MED ORDER — SODIUM CHLORIDE 0.9 % IV SOLN
2400.0000 mg/m2 | INTRAVENOUS | Status: DC
  Administered 2024-01-06: 5000 mg via INTRAVENOUS
  Filled 2024-01-06: qty 100

## 2024-01-06 MED ORDER — DEXTROSE 5 % IV SOLN
INTRAVENOUS | Status: DC
Start: 2024-01-06 — End: 2024-01-06

## 2024-01-06 MED ORDER — HEPARIN SOD (PORK) LOCK FLUSH 100 UNIT/ML IV SOLN
250.0000 [IU] | Freq: Once | INTRAVENOUS | Status: AC | PRN
  Administered 2024-01-06: 250 [IU]

## 2024-01-06 MED ORDER — FAMOTIDINE IN NACL 20-0.9 MG/50ML-% IV SOLN
20.0000 mg | Freq: Once | INTRAVENOUS | Status: AC
  Administered 2024-01-06: 20 mg via INTRAVENOUS
  Filled 2024-01-06: qty 50

## 2024-01-06 MED ORDER — SODIUM CHLORIDE 0.9 % IV SOLN
50.0000 mg/m2 | Freq: Once | INTRAVENOUS | Status: AC
  Administered 2024-01-06: 98.9 mg via INTRAVENOUS
  Filled 2024-01-06: qty 23

## 2024-01-06 MED ORDER — ATROPINE SULFATE 1 MG/ML IV SOLN: 0.4000 mg | Freq: Once | INTRAVENOUS | Status: AC

## 2024-01-06 MED ORDER — OXALIPLATIN CHEMO INJECTION 100 MG/20ML
60.0000 mg/m2 | Freq: Once | INTRAVENOUS | Status: AC
Start: 2024-01-06 — End: 2024-01-06
  Administered 2024-01-06: 120 mg via INTRAVENOUS
  Filled 2024-01-06: qty 4

## 2024-01-06 MED ORDER — SODIUM CHLORIDE 0.9% FLUSH
3.0000 mL | INTRAVENOUS | Status: DC | PRN
  Administered 2024-01-06: 3 mL

## 2024-01-06 MED ORDER — ATROPINE SULFATE 1 MG/ML IV SOLN
0.5000 mg | Freq: Once | INTRAVENOUS | Status: AC | PRN
  Administered 2024-01-06: 0.5 mg via INTRAVENOUS
  Filled 2024-01-06: qty 1

## 2024-01-06 MED ORDER — DEXTROSE 5 % IV SOLN
400.0000 mg/m2 | Freq: Once | INTRAVENOUS | Status: AC
  Administered 2024-01-06: 788 mg via INTRAVENOUS
  Filled 2024-01-06: qty 39.4

## 2024-01-06 MED ORDER — PALONOSETRON HCL INJECTION 0.25 MG/5ML
0.2500 mg | Freq: Once | INTRAVENOUS | Status: AC
  Administered 2024-01-06: 0.25 mg via INTRAVENOUS
  Filled 2024-01-06: qty 5

## 2024-01-06 MED ORDER — DEXAMETHASONE SODIUM PHOSPHATE 10 MG/ML IJ SOLN
10.0000 mg | Freq: Once | INTRAMUSCULAR | Status: AC
  Administered 2024-01-06: 10 mg via INTRAVENOUS
  Filled 2024-01-06: qty 1

## 2024-01-06 NOTE — Progress Notes (Signed)
 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 Folfirinox discontinued after first cycle due to reaction. She is currently receiving Nalirifox q28d.   12/13-12/16 admission with port infection s/p removal  Met with patient in infusion. She reports tolerating therapy well overall. Patient endorses a few days of fatigue and poor oral intake following treatment. This improves after pump disconnect. Cold sensitivity is lasting from one treatment to the next. This has not effected ability to eat and stay hydrated. Patient is drinking ~half of an Ensure which she adds to her coffee. She is agreeable to vanilla Ensure Complete during infusion. Patient denies nutrition impact symptoms at this time.   Medications: reviewed   Labs: reviewed   Anthropometrics: Wt 177 lb 4.8 oz today - stable  12/11 - 177 lb 8 oz 11/27 - 175 lb 6.4 oz    NUTRITION DIAGNOSIS: Unintentional wt loss - stable    INTERVENTION:  Recommend increasing Ensure to 3/day when appetite is low following treatment    MONITORING, EVALUATION, GOAL: wt trends, intake   NEXT VISIT: Wednesday February 5 during infusion

## 2024-01-06 NOTE — Patient Instructions (Signed)
 [facility] - A DEPT OF [facility]  Discharge Instructions: Thank you for choosing [facility] to provide your oncology and hematology care.   If you have a lab appointment with the Cancer Center, please go directly to the Cancer Center and check in at the registration area.   Wear comfortable clothing and clothing appropriate for easy access to any Portacath or PICC line.   We strive to give you quality time with your provider. You may need to reschedule your appointment if you arrive late (15 or more minutes).  Arriving late affects you and other patients whose appointments are after yours.  Also, if you miss three or more appointments without notifying the office, you may be dismissed from the clinic at the provider's discretion.      For prescription refill requests, have your pharmacy contact our office and allow 72 hours for refills to be completed.    Today you received the following chemotherapy and/or immunotherapy agents: Liposomal Irinotecan, Oxaliplatin, Leucovorin, Fluorouracil.       To help prevent nausea and vomiting after your treatment, we encourage you to take your nausea medication as directed.  BELOW ARE SYMPTOMS THAT SHOULD BE REPORTED IMMEDIATELY: *FEVER GREATER THAN 100.4 F (38 C) OR HIGHER *CHILLS OR SWEATING *NAUSEA AND VOMITING THAT IS NOT CONTROLLED WITH YOUR NAUSEA MEDICATION *UNUSUAL SHORTNESS OF BREATH *UNUSUAL BRUISING OR BLEEDING *URINARY PROBLEMS (pain or burning when urinating, or frequent urination) *BOWEL PROBLEMS (unusual diarrhea, constipation, pain near the anus) TENDERNESS IN MOUTH AND THROAT WITH OR WITHOUT PRESENCE OF ULCERS (sore throat, sores in mouth, or a toothache) UNUSUAL RASH, SWELLING OR PAIN  UNUSUAL VAGINAL DISCHARGE OR ITCHING   Items with * indicate a potential emergency and should be followed up as soon as possible or go to the Emergency Department if any problems should  occur.  Please show the CHEMOTHERAPY ALERT CARD or IMMUNOTHERAPY ALERT CARD at check-in to the Emergency Department and triage nurse.  Should you have questions after your visit or need to cancel or reschedule your appointment, please contact [facility] - [facility] DE[facility]  Dept: [phone]  and follow the prompts.  Office hours are 8:00 a.m. to 4:30 p.m. Monday - Friday. Please note that voicemails left after 4:00 p.m. may not be returned until the following business day.  We are closed weekends and major holidays. You have access to a nurse at all times for urgent questions. Please call the main number to the clinic Dept: [phone] and follow the prompts.   For any non-urgent questions, you may also contact your provider using MyChart. We now offer e-Visits for anyone 1 and older to request care online for non-urgent symptoms. For details visit [email or web address].   Also download the MyChart app! Go to the app store, search "MyChart", open the app, select [place], and log in with your MyChart username and password.

## 2024-01-08 ENCOUNTER — Inpatient Hospital Stay: Payer: 59

## 2024-01-08 ENCOUNTER — Encounter: Payer: Self-pay | Admitting: Hematology

## 2024-01-08 VITALS — BP 107/65 | HR 77 | Temp 97.8°F | Resp 16

## 2024-01-08 DIAGNOSIS — C251 Malignant neoplasm of body of pancreas: Secondary | ICD-10-CM

## 2024-01-08 DIAGNOSIS — Z5111 Encounter for antineoplastic chemotherapy: Secondary | ICD-10-CM | POA: Diagnosis not present

## 2024-01-08 MED ORDER — SODIUM CHLORIDE 0.9% FLUSH
10.0000 mL | INTRAVENOUS | Status: DC | PRN
Start: 2024-01-08 — End: 2024-01-08
  Administered 2024-01-08: 10 mL

## 2024-01-08 MED ORDER — PEGFILGRASTIM-CBQV 6 MG/0.6ML ~~LOC~~ SOSY
6.0000 mg | PREFILLED_SYRINGE | Freq: Once | SUBCUTANEOUS | Status: AC
Start: 2024-01-08 — End: 2024-01-08
  Administered 2024-01-08: 6 mg via SUBCUTANEOUS

## 2024-01-08 MED ORDER — HEPARIN SOD (PORK) LOCK FLUSH 100 UNIT/ML IV SOLN
500.0000 [IU] | Freq: Once | INTRAVENOUS | Status: AC | PRN
Start: 2024-01-08 — End: 2024-01-08
  Administered 2024-01-08: 500 [IU]

## 2024-01-10 ENCOUNTER — Encounter: Payer: Self-pay | Admitting: Nurse Practitioner

## 2024-01-10 ENCOUNTER — Encounter: Payer: Self-pay | Admitting: Hematology

## 2024-01-11 ENCOUNTER — Other Ambulatory Visit (HOSPITAL_COMMUNITY): Payer: 59

## 2024-01-11 DIAGNOSIS — C259 Malignant neoplasm of pancreas, unspecified: Secondary | ICD-10-CM | POA: Diagnosis not present

## 2024-01-13 ENCOUNTER — Encounter (HOSPITAL_COMMUNITY): Payer: Self-pay

## 2024-01-13 ENCOUNTER — Ambulatory Visit (HOSPITAL_COMMUNITY)
Admission: RE | Admit: 2024-01-13 | Discharge: 2024-01-13 | Disposition: A | Discharge status: Home / Self Care | Source: Ambulatory Visit | Attending: Radiology | Admitting: Radiology

## 2024-01-13 ENCOUNTER — Encounter (HOSPITAL_COMMUNITY): Payer: Self-pay | Admitting: Internal Medicine

## 2024-01-13 ENCOUNTER — Other Ambulatory Visit (HOSPITAL_COMMUNITY): Payer: Self-pay | Admitting: Internal Medicine

## 2024-01-13 DIAGNOSIS — T80219D Unspecified infection due to central venous catheter, subsequent encounter: Secondary | ICD-10-CM

## 2024-01-13 DIAGNOSIS — Z4801 Encounter for change or removal of surgical wound dressing: Secondary | ICD-10-CM | POA: Insufficient documentation

## 2024-01-13 DIAGNOSIS — Y839 Surgical procedure, unspecified as the cause of abnormal reaction of the patient, or of later complication, without mention of misadventure at the time of the procedure: Secondary | ICD-10-CM | POA: Insufficient documentation

## 2024-01-13 HISTORY — PX: IR PATIENT EVAL TECH 0-60 MINS: IMG5564

## 2024-01-13 NOTE — Procedures (Signed)
 Patient was seen in IR today for [facility] Removal Site Evaluation and Hydrogel application.  Bandage was removed and site was assessed.  Port site was cleaned, irrigated, and old hydrogel was removed.  New hydrogel was placed in port pocket and a bandage was placed over port site. Patient was instructed to continue keeping the site clean and dry and leave the bandage in place, and if she was to develop any symptoms of fever, chills, pain at site, or have any concerns, to please call IR. Patient is scheduled to return to IR on Friday, 01/22/2024 for another [facility] Removal Site evaluation and possibly another application of Hydrogel.

## 2024-01-19 NOTE — Assessment & Plan Note (Signed)
-  stage IV with liver mets -diagnosed in 10/2023 -Patient has a history of breast cancer. She presents with dyspnea, chest discomfort, decreased appetite, and weight loss. She also reports new onset back pain. -CT showed a 2.9cm mass in pancreatic neck/body, and multiple liver mets, liver biopsy confirmed adenocarcinoma  -I reviewed the aggressive nature of pancreatic cancer, and the incurable nature of her disease due to diffuse liver metastasis. -she start first line chemo Nalirifox chemotherapy regimen on 11/12, with dose reduction for first cycle due to poor PS -Perform molecular testing on tumor tissue to identify potential targeted therapy options. -She underwent genetic testing and came back negative

## 2024-01-20 ENCOUNTER — Inpatient Hospital Stay: Payer: 59 | Admitting: Hematology

## 2024-01-20 ENCOUNTER — Inpatient Hospital Stay: Payer: 59

## 2024-01-20 ENCOUNTER — Encounter: Payer: Self-pay | Admitting: Hematology

## 2024-01-20 VITALS — BP 105/72 | HR 85 | Temp 97.3°F | Resp 18 | Wt 175.0 lb

## 2024-01-20 DIAGNOSIS — C259 Malignant neoplasm of pancreas, unspecified: Secondary | ICD-10-CM | POA: Diagnosis not present

## 2024-01-20 DIAGNOSIS — Z5111 Encounter for antineoplastic chemotherapy: Secondary | ICD-10-CM | POA: Diagnosis not present

## 2024-01-20 DIAGNOSIS — C251 Malignant neoplasm of body of pancreas: Secondary | ICD-10-CM

## 2024-01-20 DIAGNOSIS — C50812 Malignant neoplasm of overlapping sites of left female breast: Secondary | ICD-10-CM

## 2024-01-20 LAB — CMP (CANCER CENTER ONLY)
ALT: 11 U/L (ref 0–44)
AST: 19 U/L (ref 15–41)
Albumin: 3.8 g/dL (ref 3.5–5.0)
Alkaline Phosphatase: 168 U/L — ABNORMAL HIGH (ref 38–126)
Anion gap: 7 (ref 5–15)
BUN: 7 mg/dL — ABNORMAL LOW (ref 8–23)
CO2: 28 mmol/L (ref 22–32)
Calcium: 9.5 mg/dL (ref 8.9–10.3)
Chloride: 103 mmol/L (ref 98–111)
Creatinine: 0.52 mg/dL (ref 0.44–1.00)
GFR, Estimated: >60 mL/min (ref >60)
Glucose, Bld: 121 mg/dL — ABNORMAL HIGH (ref 70–99)
Potassium: 3.3 mmol/L — ABNORMAL LOW (ref 3.5–5.1)
Sodium: 138 mmol/L (ref 135–145)
Total Bilirubin: 0.4 mg/dL (ref 0.0–1.2)
Total Protein: 6.8 g/dL (ref 6.5–8.1)

## 2024-01-20 LAB — CBC WITH DIFFERENTIAL (CANCER CENTER ONLY)
Abs Immature Granulocytes: 0.09 10*3/uL — ABNORMAL HIGH (ref 0.00–0.07)
Basophils Absolute: 0 10*3/uL (ref 0.0–0.1)
Basophils Relative: 0 %
Eosinophils Absolute: 0.1 10*3/uL (ref 0.0–0.5)
Eosinophils Relative: 1 %
HCT: 34.2 % — ABNORMAL LOW (ref 36.0–46.0)
Hemoglobin: 11.4 g/dL — ABNORMAL LOW (ref 12.0–15.0)
Immature Granulocytes: 1 %
Lymphocytes Relative: 20 %
Lymphs Abs: 1.6 10*3/uL (ref 0.7–4.0)
MCH: 25.6 pg — ABNORMAL LOW (ref 26.0–34.0)
MCHC: 33.3 g/dL (ref 30.0–36.0)
MCV: 76.9 fL — ABNORMAL LOW (ref 80.0–100.0)
Monocytes Absolute: 0.8 10*3/uL (ref 0.1–1.0)
Monocytes Relative: 10 %
Neutro Abs: 5.5 10*3/uL (ref 1.7–7.7)
Neutrophils Relative %: 68 %
Platelet Count: 251 10*3/uL (ref 150–400)
RBC: 4.45 MIL/uL (ref 3.87–5.11)
RDW: 21.4 % — ABNORMAL HIGH (ref 11.5–15.5)
WBC Count: 8.1 10*3/uL (ref 4.0–10.5)
nRBC: 0 % (ref 0.0–0.2)

## 2024-01-20 MED ORDER — DEXTROSE 5 % IV SOLN: INTRAVENOUS | Status: DC

## 2024-01-20 MED ORDER — OXALIPLATIN CHEMO INJECTION 100 MG/20ML
60.0000 mg/m2 | Freq: Once | INTRAVENOUS | Status: AC
  Administered 2024-01-20: 120 mg via INTRAVENOUS
  Filled 2024-01-20: qty 20

## 2024-01-20 MED ORDER — ATROPINE SULFATE 1 MG/ML IV SOLN
0.4000 mg | Freq: Once | INTRAVENOUS | Status: AC
Start: 2024-01-20 — End: 2024-01-20
  Administered 2024-01-20: 0.4 mg via INTRAVENOUS
  Filled 2024-01-20: qty 1

## 2024-01-20 MED ORDER — PALONOSETRON HCL INJECTION 0.25 MG/5ML
0.2500 mg | Freq: Once | INTRAVENOUS | Status: AC
  Administered 2024-01-20: 0.25 mg via INTRAVENOUS
  Filled 2024-01-20: qty 5

## 2024-01-20 MED ORDER — SODIUM CHLORIDE 0.9% FLUSH
3.0000 mL | INTRAVENOUS | Status: DC | PRN
Start: 2024-01-20 — End: 2024-01-20

## 2024-01-20 MED ORDER — LEUCOVORIN CALCIUM INJECTION 350 MG
400.0000 mg/m2 | Freq: Once | INTRAVENOUS | Status: AC
  Administered 2024-01-20: 788 mg via INTRAVENOUS
  Filled 2024-01-20: qty 25

## 2024-01-20 MED ORDER — IRINOTECAN HCL LIPOSOME CHEMO INJECTION 43 MG/10ML
50.0000 mg/m2 | INJECTION | Freq: Once | INTRAVENOUS | Status: AC
  Administered 2024-01-20: 98.9 mg via INTRAVENOUS
  Filled 2024-01-20: qty 23

## 2024-01-20 MED ORDER — SODIUM CHLORIDE 0.9% FLUSH
10.0000 mL | Freq: Once | INTRAVENOUS | Status: AC
  Administered 2024-01-20: 10 mL

## 2024-01-20 MED ORDER — DEXAMETHASONE SODIUM PHOSPHATE 10 MG/ML IJ SOLN
10.0000 mg | Freq: Once | INTRAMUSCULAR | Status: AC
  Administered 2024-01-20: 10 mg via INTRAVENOUS
  Filled 2024-01-20: qty 1

## 2024-01-20 MED ORDER — FLUOROURACIL CHEMO INJECTION 5 GM/100ML
2400.0000 mg/m2 | INTRAVENOUS | Status: DC
  Administered 2024-01-20: 5000 mg via INTRAVENOUS
  Filled 2024-01-20: qty 100

## 2024-01-20 MED ORDER — HEPARIN SOD (PORK) LOCK FLUSH 100 UNIT/ML IV SOLN: 250.0000 [IU] | Freq: Once | INTRAVENOUS | Status: DC | PRN

## 2024-01-20 MED ORDER — FAMOTIDINE IN NACL 20-0.9 MG/50ML-% IV SOLN
20.0000 mg | Freq: Once | INTRAVENOUS | Status: AC
Start: 2024-01-20 — End: 2024-01-20
  Administered 2024-01-20: 20 mg via INTRAVENOUS
  Filled 2024-01-20: qty 50

## 2024-01-20 NOTE — Patient Instructions (Signed)
[facility] - A DEPT OF [facility]  Discharge Instructions: Thank you for choosing [facility] to provide your oncology and hematology care.   If you have a lab appointment with the Cancer Center, please go directly to the Cancer Center and check in at the registration area.   Wear comfortable clothing and clothing appropriate for easy access to any Portacath or PICC line.   We strive to give you quality time with your provider. You may need to reschedule your appointment if you arrive late (15 or more minutes).  Arriving late affects you and other patients whose appointments are after yours.  Also, if you miss three or more appointments without notifying the office, you may be dismissed from the clinic at the provider's discretion.      For prescription refill requests, have your pharmacy contact our office and allow 72 hours for refills to be completed.    Today you received the following chemotherapy and/or immunotherapy agents: Leucovorin, Oxaliplatin, Irinotecan      To help prevent nausea and vomiting after your treatment, we encourage you to take your nausea medication as directed.  BELOW ARE SYMPTOMS THAT SHOULD BE REPORTED IMMEDIATELY: *FEVER GREATER THAN 100.4 F (38 C) OR HIGHER *CHILLS OR SWEATING *NAUSEA AND VOMITING THAT IS NOT CONTROLLED WITH YOUR NAUSEA MEDICATION *UNUSUAL SHORTNESS OF BREATH *UNUSUAL BRUISING OR BLEEDING *URINARY PROBLEMS (pain or burning when urinating, or frequent urination) *BOWEL PROBLEMS (unusual diarrhea, constipation, pain near the anus) TENDERNESS IN MOUTH AND THROAT WITH OR WITHOUT PRESENCE OF ULCERS (sore throat, sores in mouth, or a toothache) UNUSUAL RASH, SWELLING OR PAIN  UNUSUAL VAGINAL DISCHARGE OR ITCHING   Items with * indicate a potential emergency and should be followed up as soon as possible or go to the Emergency Department if any problems should occur.  Please show the CHEMOTHERAPY  ALERT CARD or IMMUNOTHERAPY ALERT CARD at check-in to the Emergency Department and triage nurse.  Should you have questions after your visit or need to cancel or reschedule your appointment, please contact [facility] - A DEPT OF [facility]  Dept: [phone]  and follow the prompts.  Office hours are 8:00 a.m. to 4:30 p.m. Monday - Friday. Please note that voicemails left after 4:00 p.m. may not be returned until the following business day.  We are closed weekends and major holidays. You have access to a nurse at all times for urgent questions. Please call the main number to the clinic Dept: [phone] and follow the prompts.   For any non-urgent questions, you may also contact your provider using MyChart. We now offer e-Visits for anyone 35 and older to request care online for non-urgent symptoms. For details visit [email or web address].   Also download the MyChart app! Go to the app store, search "MyChart", open the app, select [place], and log in with your MyChart username and password.

## 2024-01-20 NOTE — Progress Notes (Signed)
[facility]   Telephone:([phone]) [phone] Fax:(336) [phone]   Clinic Follow up Note   Patient Care Team: [name], PA as PCP - General (Nurse Practitioner) [name], MD as Consulting Physician (Hematology and Oncology) [name], MD as Consulting Physician (Radiation Oncology) [name], MD as Consulting Physician (General Surgery) [name], MD as Consulting Physician (Hematology and Oncology)  Date of Service:  01/20/2024  CHIEF COMPLAINT: f/u of pancreatic cancer  CURRENT THERAPY:  First-line chemotherapy NALIRINOX   Oncology History   Pancreatic cancer [facility]) -stage IV with liver mets -diagnosed in 10/2023 -Patient has a history of breast cancer. She presents with dyspnea, chest discomfort, decreased appetite, and weight loss. She also reports new onset back pain. -CT showed a 2.9cm mass in pancreatic neck/body, and multiple liver mets, liver biopsy confirmed adenocarcinoma  -I reviewed the aggressive nature of pancreatic cancer, and the incurable nature of her disease due to diffuse liver metastasis. -she start first line chemo Nalirifox chemotherapy regimen on 11/12, with dose reduction for first cycle due to poor PS -Perform molecular testing on tumor tissue to identify potential targeted therapy options. -She underwent genetic testing and came back negative    Assessment and Plan    Pancreatic Cancer Undergoing sixth cycle of chemotherapy. Managed constipation with MiraLAX, nausea with Compazine, and headaches with Tylenol. Significant side effects from the last shot, including headache and general achiness, led to holding the shot for this cycle. Blood counts, kidney, and liver functions are normal. Tumor markers to be checked every four weeks. Previous port infection managed with a PICC line. Discussed infection risks and need for weekly dressing changes and flushes. - Order CT scan of the abdomen and pelvis with contrast in 1-2 weeks -  Proceed with chemotherapy today - Check tumor markers every four weeks - Arrange weekly PICC line dressing changes and flushes - Schedule next chemotherapy session in two weeks  PICC Line Management PICC line in place for chemotherapy. Prefers PICC line over a port due to ease of use and fewer complications. Requires weekly dressing changes and flushes to minimize infection risk. Discussed covering the PICC line during showers to prevent infection. - Arrange weekly PICC line dressing changes and flushes - Ensure PICC line is covered during showers  General Health Maintenance On Eliquis for anticoagulation without issues. Taking Claritin daily as recommended. - Continue Eliquis as prescribed - Continue daily Claritin  Plan -Lab reviewed, adequate for treatment, will proceed to cycle 6 chemo today -Restaging CT scan in 1 to 2 weeks - Follow up in two weeks for next chemotherapy session -Will schedule PICC line dressing changed weekly       SUMMARY OF ONCOLOGIC HISTORY: Oncology History  Malignant neoplasm of overlapping sites of left breast in female, estrogen receptor positive (HCC)  08/04/2018 Initial Diagnosis   Screening mammogram detected left breast mass at 9:30 position 1.2 x 1.2 x 1.1 cm.  Closer to the nipple at 9:30 position 0.8 cm lesion span 3.6 cm and a 1.5 cm apart.  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 position 2 cm from nipple, at 1 cm from nipple PASH, lymph node benign, ER 90%, PR 95%, Ki-67 20%, HER-2 negative by IHC 1+, T1CN0 stage Ia AJCC 8   09/29/2018 Surgery   Left lumpectomy: Grade 3 IDC, 1.7 cm, margins negative, lymphovascular invasion present, 0/2 lymph nodes negative, T1c N0 stage Ia  10/06/2018 Cancer Staging   Staging form: Breast, AJCC 8th Edition - Pathologic: Stage IA (pT1c, pN0(sn), cM0, G3, ER+, PR+, HER2-) - Signed by [name], MD on 10/06/2018   10/22/2018 Oncotype  testing   Oncotype score 13: Distant recurrence at 9 years with hormone therapy alone 4%   11/12/2018 - 12/27/2018 Radiation Therapy   Adjuvant radiation therapy   12/27/2018 -  Anti-estrogen oral therapy   Antiestrogen therapy with letrozole 2.5 mg daily   11/30/2023 Genetic Testing   Negative genetic testing on the CancerNext-Expanded+RNAinsight panel.  VUS identified in KIT c.2083G>C and PMS2 c.-1C>A.  The report date is 11/30/2023.  The CancerNext-Expanded gene panel offered by [facility] and includes sequencing, rearrangement, and RNA analysis for the following 76 genes: AIP, ALK, APC, ATM, AXIN2, BAP1, BARD1, BMPR1A, BRCA1, BRCA2, BRIP1, CDC73, CDH1, CDK4, CDKN1B, CDKN2A, CEBPA, CHEK2, CTNNA1, DDX41, DICER1, ETV6, FH, FLCN, GATA2, LZTR1, MAX, MBD4, MEN1, MET, MLH1, MSH2, MSH3, MSH6, MUTYH, NF1, NF2, NTHL1, PALB2, PHOX2B, PMS2, POT1, PRKAR1A, PTCH1, PTEN, RAD51C, RAD51D, RB1, RET, RUNX1, SDHA, SDHAF2, SDHB, SDHC, SDHD, SMAD4, SMARCA4, SMARCB1, SMARCE1, STK11, SUFU, TMEM127, TP53, TSC1, TSC2, VHL, and WT1 (sequencing and deletion/duplication); EGFR, HOXB13, KIT, MITF, PDGFRA, POLD1, and POLE (sequencing only); EPCAM and GREM1 (deletion/duplication only).    Pancreatic cancer (HCC)  11/03/2023 Initial Diagnosis   Pancreatic cancer (HCC)   11/11/2023 -  Chemotherapy   Patient is on Treatment Plan : PANCREAS NALIRIFOX D1, 15 Q28D     11/30/2023 Genetic Testing   Negative genetic testing on the CancerNext-Expanded+RNAinsight panel.  VUS identified in KIT c.2083G>C and PMS2 c.-1C>A.  The report date is 11/30/2023.  The CancerNext-Expanded gene panel offered by [facility] and includes sequencing, rearrangement, and RNA analysis for the following 76 genes: AIP, ALK, APC, ATM, AXIN2, BAP1, BARD1, BMPR1A, BRCA1, BRCA2, BRIP1, CDC73, CDH1, CDK4, CDKN1B, CDKN2A, CEBPA, CHEK2, CTNNA1, DDX41, DICER1, ETV6, FH, FLCN, GATA2, LZTR1, MAX, MBD4, MEN1, MET, MLH1, MSH2, MSH3, MSH6, MUTYH, NF1, NF2, NTHL1,  PALB2, PHOX2B, PMS2, POT1, PRKAR1A, PTCH1, PTEN, RAD51C, RAD51D, RB1, RET, RUNX1, SDHA, SDHAF2, SDHB, SDHC, SDHD, SMAD4, SMARCA4, SMARCB1, SMARCE1, STK11, SUFU, TMEM127, TP53, TSC1, TSC2, VHL, and WT1 (sequencing and deletion/duplication); EGFR, HOXB13, KIT, MITF, PDGFRA, POLD1, and POLE (sequencing only); EPCAM and GREM1 (deletion/duplication only).       Discussed the use of AI scribe software for clinical note transcription with the patient, who gave verbal consent to proceed.  History of Present Illness   Ashley Clark, a patient with pancreatic cancer, is currently on his sixth cycle of chemotherapy. He reports constipation and occasional nausea, which he manages with MiraLAX and Compazine respectively. He also experienced discomfort, including headaches and aches, from a shot he received. He has a PICC line in place for his treatment, which he prefers over a port due to ease of maneuverability. He has been managing well with the PICC line, but it needs to be flushed and the dressing changed weekly.         All other systems were reviewed with the patient and are negative.  MEDICAL HISTORY:  Past Medical History:  Diagnosis Date   Acute pulmonary embolism (HCC) 10/29/2023   Allergy    Back pain    Cancer (HCC) 2019   left breast cancer-last radiation Dec.2019   Diabetes mellitus without complication [facility])    Medical history non-contributory    Personal history of radiation therapy    Port-A-Cath in place - removed 12-11-2023 due to port-a-cath infection. 11/13/2023   Prediabetes  Tiredness    Vitamin D deficiency     SURGICAL HISTORY: Past Surgical History:  Procedure Laterality Date   BREAST BIOPSY Left 08/04/2018   x3   BREAST LUMPECTOMY Left    09-29-18   BREAST LUMPECTOMY WITH RADIOACTIVE SEED AND SENTINEL LYMPH NODE BIOPSY Left 09/29/2018   Procedure: LEFT BREAST LUMPECTOMY WITH RADIOACTIVE SEED AND SENTINEL LYMPH NODE BIOPSY;  Surgeon: [name], MD;  Location:  [facility];  Service: General;  Laterality: Left;   COLONOSCOPY  2016   IR IMAGING GUIDED PORT INSERTION  11/03/2023   IR PATIENT EVAL TECH 0-60 MINS  12/18/2023   IR PATIENT EVAL TECH 0-60 MINS  12/24/2023   IR PATIENT EVAL TECH 0-60 MINS  12/28/2023   IR PATIENT EVAL TECH 0-60 MINS  01/04/2024   IR PATIENT EVAL TECH 0-60 MINS  01/13/2024   IR RADIOLOGIST EVAL & MGMT  12/15/2023   IR REMOVAL TUN ACCESS W/ PORT W/O FL MOD SED  12/11/2023   POLYPECTOMY     WISDOM TOOTH EXTRACTION      I have reviewed the social history and family history with the patient and they are unchanged from previous note.  ALLERGIES:  is allergic to irinotecan liposome.  MEDICATIONS:  Current Outpatient Medications  Medication Sig Dispense Refill   apixaban (ELIQUIS) 5 MG TABS tablet Take 1 tablet (5 mg total) by mouth 2 (two) times daily. 60 tablet 3   cetirizine (ZYRTEC) 10 MG chewable tablet Chew 10 mg by mouth daily.     lidocaine-prilocaine (EMLA) cream Apply to affected area once (Patient taking differently: Apply 1 Application topically See admin instructions. Apply to affected area for port access) 30 g 3   ondansetron (ZOFRAN) 8 MG tablet Take 1 tablet (8 mg total) by mouth every 8 (eight) hours as needed for nausea or vomiting. Start on the third day after irinotecan 30 tablet 1   prochlorperazine (COMPAZINE) 10 MG tablet Take 1 tablet (10 mg total) by mouth every 6 (six) hours as needed for nausea or vomiting. 30 tablet 1   No current facility-administered medications for this visit.   Facility-Administered Medications Ordered in Other Visits  Medication Dose Route Frequency Provider Last Rate Last Admin   heparin lock flush 100 unit/mL  500 Units Intracatheter Once PRN [name], MD       sodium chloride flush (NS) 0.9 % injection 10 mL  10 mL Intracatheter PRN [name], MD        PHYSICAL EXAMINATION: ECOG PERFORMANCE STATUS: 1 - Symptomatic but completely ambulatory  Vitals:    01/20/24 1027  BP: 105/72  Pulse: 85  Resp: 18  Temp: (!) 97.3 F (36.3 C)  SpO2: 99%   Wt Readings from Last 3 Encounters:  01/20/24 175 lb (79.4 kg)  01/06/24 177 lb 4.8 oz (80.4 kg)  12/24/23 178 lb 8 oz (81 kg)     GENERAL:alert, no distress and comfortable SKIN: skin color, texture, turgor are normal, no rashes or significant lesions EYES: normal, Conjunctiva are pink and non-injected, sclera clear NECK: supple, thyroid normal size, non-tender, without nodularity LYMPH:  no palpable lymphadenopathy in the cervical, axillary  LUNGS: clear to auscultation and percussion with normal breathing effort HEART: regular rate & rhythm and no murmurs and no lower extremity edema ABDOMEN:abdomen soft, non-tender and normal bowel sounds Musculoskeletal:no cyanosis of digits and no clubbing  NEURO: alert & oriented x 3 with fluent speech, no focal motor/sensory deficits    LABORATORY DATA:  I have reviewed the data as listed    Latest Ref Rng & Units 01/20/2024    9:52 AM 01/06/2024    9:53 AM 12/24/2023    9:59 AM  CBC  WBC 4.0 - 10.5 K/uL 8.1  10.1  6.0   Hemoglobin 12.0 - 15.0 g/dL 25.4  27.0  62.3   Hematocrit 36.0 - 46.0 % 34.2  35.7  36.2   Platelets 150 - 400 K/uL 251  266  216         Latest Ref Rng & Units 01/20/2024    9:52 AM 01/06/2024    9:53 AM 12/24/2023    9:59 AM  CMP  Glucose 70 - 99 mg/dL 762  831  517   BUN 8 - 23 mg/dL 7  9  7    Creatinine 0.44 - 1.00 mg/dL 6.16  0.73  7.10   Sodium 135 - 145 mmol/L 138  138  139   Potassium 3.5 - 5.1 mmol/L 3.3  3.8  3.6   Chloride 98 - 111 mmol/L 103  104  105   CO2 22 - 32 mmol/L 28  27  27    Calcium 8.9 - 10.3 mg/dL 9.5  9.4  9.7   Total Protein 6.5 - 8.1 g/dL 6.8  6.9  6.9   Total Bilirubin 0.0 - 1.2 mg/dL 0.4  0.3  0.4   Alkaline Phos 38 - 126 U/L 168  206  218   AST 15 - 41 U/L 19  21  28    ALT 0 - 44 U/L 11  14  23        RADIOGRAPHIC STUDIES: I have personally reviewed the radiological images as listed  and agreed with the findings in the report. No results found.    Orders Placed This Encounter  Procedures   CT ABDOMEN PELVIS W CONTRAST    Standing Status:   Future    Expected Date:   01/27/2024    Expiration Date:   01/19/2025    If indicated for the ordered procedure, I authorize the administration of contrast media per Radiology protocol:   Yes    Does the patient have a contrast media/X-ray dye allergy?:   No    Preferred imaging location?:   [facility]    If indicated for the ordered procedure, I authorize the administration of oral contrast media per Radiology protocol:   Yes   Cancer antigen 19-9    Standing Status:   Standing    Number of Occurrences:   30    Expiration Date:   01/19/2025   All questions were answered. The patient knows to call the clinic with any problems, questions or concerns. No barriers to learning was detected. The total time spent in the appointment was 30 minutes.     [name], MD 01/20/2024

## 2024-01-22 ENCOUNTER — Inpatient Hospital Stay: Payer: 59

## 2024-01-22 ENCOUNTER — Ambulatory Visit (HOSPITAL_COMMUNITY)
Admission: RE | Admit: 2024-01-22 | Discharge: 2024-01-22 | Disposition: A | Discharge status: Home / Self Care | Payer: 59 | Source: Ambulatory Visit | Attending: Radiology | Admitting: Radiology

## 2024-01-22 ENCOUNTER — Encounter (HOSPITAL_COMMUNITY): Payer: Self-pay

## 2024-01-22 VITALS — BP 98/65 | HR 103 | Temp 98.8°F | Resp 16

## 2024-01-22 DIAGNOSIS — Z5111 Encounter for antineoplastic chemotherapy: Secondary | ICD-10-CM | POA: Diagnosis not present

## 2024-01-22 DIAGNOSIS — C50812 Malignant neoplasm of overlapping sites of left female breast: Secondary | ICD-10-CM

## 2024-01-22 DIAGNOSIS — T80219D Unspecified infection due to central venous catheter, subsequent encounter: Secondary | ICD-10-CM

## 2024-01-22 HISTORY — PX: IR PATIENT EVAL TECH 0-60 MINS: IMG5564

## 2024-01-22 MED ORDER — SODIUM CHLORIDE 0.9% FLUSH
10.0000 mL | Freq: Once | INTRAVENOUS | Status: AC
  Administered 2024-01-22: 10 mL

## 2024-01-22 MED ORDER — HEPARIN SOD (PORK) LOCK FLUSH 100 UNIT/ML IV SOLN
500.0000 [IU] | Freq: Once | INTRAVENOUS | Status: AC
  Administered 2024-01-22: 500 [IU]

## 2024-01-22 NOTE — Procedures (Signed)
Patient seen in IR today, 01/22/2024, for [facility] Removal Site Evaluation following multiple hydrogel application visits.  Patient's bandage was removed and site was assessed.  Port Removal Site looked to be healing very well and closing up well, per K. Ashley Royalty, PA, another hydrogel application was not necessary at this time.  Patient's port removal site was then dressed with a regular band-aid, and patient was instructed to continue to keep site clean and dry, and to call IR if there are any concerns or issues with her Port Removal Site.

## 2024-01-22 NOTE — Progress Notes (Signed)
Interventional Radiology Brief Note:  Patient assessed in Radiology today for Port site eval.  She has been keeping her procedure site clean and dry.  She notes no drainage or purulence.  Overall site healing well, without issues.  Unable to express any Hydrogel during visit.  Wound bed clean and dry. No true "pocket" remains and the wound bed is closing appropriately.  No additional application of hydrogel needed at this time. Routine site care. Patient verbalizes understanding.   Loyce Dys, MS RD PA-C

## 2024-01-29 ENCOUNTER — Other Ambulatory Visit: Payer: Self-pay

## 2024-01-29 ENCOUNTER — Ambulatory Visit (HOSPITAL_COMMUNITY)
Admission: RE | Admit: 2024-01-29 | Discharge: 2024-01-29 | Disposition: A | Discharge status: Home / Self Care | Payer: 59 | Source: Ambulatory Visit | Attending: Hematology | Admitting: Hematology

## 2024-01-29 DIAGNOSIS — C787 Secondary malignant neoplasm of liver and intrahepatic bile duct: Secondary | ICD-10-CM | POA: Diagnosis not present

## 2024-01-29 DIAGNOSIS — E119 Type 2 diabetes mellitus without complications: Secondary | ICD-10-CM | POA: Diagnosis not present

## 2024-01-29 DIAGNOSIS — C251 Malignant neoplasm of body of pancreas: Secondary | ICD-10-CM | POA: Insufficient documentation

## 2024-01-29 DIAGNOSIS — K8689 Other specified diseases of pancreas: Secondary | ICD-10-CM | POA: Diagnosis not present

## 2024-01-29 DIAGNOSIS — C259 Malignant neoplasm of pancreas, unspecified: Secondary | ICD-10-CM | POA: Diagnosis not present

## 2024-01-29 DIAGNOSIS — K760 Fatty (change of) liver, not elsewhere classified: Secondary | ICD-10-CM | POA: Diagnosis not present

## 2024-01-29 MED ORDER — IOHEXOL 300 MG/ML  SOLN
100.0000 mL | Freq: Once | INTRAMUSCULAR | Status: AC | PRN
  Administered 2024-01-29: 100 mL via INTRAVENOUS

## 2024-02-01 ENCOUNTER — Telehealth: Payer: Self-pay

## 2024-02-01 NOTE — Telephone Encounter (Signed)
Notified patient that her [employer] documents had been completed and faxed back to company. Fax confirmation received. Copy of documents placed up front for pick up at next visit.

## 2024-02-02 NOTE — Assessment & Plan Note (Addendum)
-  stage IV with liver mets -diagnosed in 10/2023 -Patient has a history of breast cancer. She presents with dyspnea, chest discomfort, decreased appetite, and weight loss. She also reports new onset back pain. -CT showed a 2.9cm mass in pancreatic neck/body, and multiple liver mets, liver biopsy confirmed adenocarcinoma  -I reviewed the aggressive nature of pancreatic cancer, and the incurable nature of her disease due to diffuse liver metastasis. -she start first line chemo Nalirifox chemotherapy regimen on 11/12, with dose reduction for first cycle due to poor PS -Perform molecular testing on tumor tissue to identify potential targeted therapy options. -She underwent genetic testing and came back negative  -01/29/2024 -restaging CT CAP showed diminished size of hypodense mass of the pancreatic neck and diminished size and hypodensity of numerous liver metastases. There is a new pseudocirrhotic capsular retraction. All of these findings are consistent with treatment response. There is unchanged focal effacement and occlusion of the portal confluence. There is unchanged retroperitoneal lymph nodes.  -02/03/2024 -today is cycle 4 day 1 NALIRIFOX. Add Udenyca  to day of pump d/c -plan to continue current chemotherapy for additional 3 months and then repeat restaging CT CAP.  --consider cutting back or dropping oxaliplatin  if continued positive response.

## 2024-02-02 NOTE — Progress Notes (Addendum)
 Patient Care Team: [name], [name], [name] as PCP - General (Nurse Practitioner) [name], MD as Consulting Physician (Hematology and Oncology) [name], MD as Consulting Physician (Radiation Oncology) [name], MD as Consulting Physician (General Surgery) [name], MD as Consulting Physician (Hematology and Oncology)  Clinic Day:  02/03/2024  Referring physician: [name], MD  ASSESSMENT & PLAN:   Assessment & Plan: Pancreatic cancer [facility]) -stage IV with liver mets -diagnosed in 10/2023 -Patient has a history of breast cancer. She presents with dyspnea, chest discomfort, decreased appetite, and weight loss. She also reports new onset back pain. -CT showed a 2.9cm mass in pancreatic neck/body, and multiple liver mets, liver biopsy confirmed adenocarcinoma  -I reviewed the aggressive nature of pancreatic cancer, and the incurable nature of her disease due to diffuse liver metastasis. -she start first line chemo Nalirifox chemotherapy regimen on 11/12, with dose reduction for first cycle due to poor PS -Perform molecular testing on tumor tissue to identify potential targeted therapy options. -She underwent genetic testing and came back negative  -01/29/2024 -restaging CT CAP showed diminished size of hypodense mass of the pancreatic neck and diminished size and hypodensity of numerous liver metastases. There is a new pseudocirrhotic capsular retraction. All of these findings are consistent with treatment response. There is unchanged focal effacement and occlusion of the portal confluence. There is unchanged retroperitoneal lymph nodes.  -02/03/2024 -today is cycle 4 day 1 NALIRIFOX. Add Udenyca  to day of pump d/c -plan to continue current chemotherapy for additional 3 months and then repeat restaging CT CAP.  --consider cutting back or dropping oxaliplatin  if continued positive response.   Plan: The patient was seen along with Dr. Lanny today. Labs reviewed  -CBC showing WBC 2.9;  Hgb 10.5; Hct 32.8; Plt 214; Anc 1.5 -CMP - K 3.5; glucose 115; BUN 7; Creatinine 0.63; eGFR >60; Ca 9.3; AST 18; ALT 11; ALKP 150.   -reviewed results of CT CAP with the patient and her husband. There is diminished size of hypodense mass of the pancreatic neck and diminished size and hypodensity of numerous liver metastases. There is a new pseudocirrhotic capsular retraction. All of these findings are consistent with treatment response. There is unchanged focal effacement and occlusion of the portal confluence. There is unchanged retroperitoneal lymph nodes.  Plan to continue treatment without adjustments for the next 3 months and repeat CT CAP for restaging. Labs and patient presentation are satisfactory for treatment today.  Proceed with Cycle 4 day 1 NALIRIFOX Add udenyca  back to Day 3 and 17 of each cycle during pump D/C. She knows to take claritin  and tylenol  to reduce pain related to this injection.  Reached out to scheduling to ensure that patient is scheduled for weekly PICC line flushes and dressing changes. Currently, these are only set up for the days of treatment.  Labs/flush/dressing change, follow up, and treatment in 2 weeks as scheduled.  The patient understands the plans discussed today and is in agreement with them.  She knows to contact our office if she develops concerns prior to her next appointment.  I provided 25 minutes of face-to-face time during this encounter and > 50% was spent counseling as documented under my assessment and plan.    Powell FORBES Lessen, NP  [facility] - A DEPT OF [facility] [address] [place] [address] Dept: [phone] Dept Fax: [phone]   No orders of the defined types were placed  in this encounter.     CHIEF COMPLAINT:  CC: Pancreatic cancer  Current Treatment: First-line chemotherapy NALIRIFOX  INTERVAL HISTORY:  Ashley Clark is here today for repeat clinical  assessment.  Was last seen by Dr. Lanny on 01/20/2024.  Restaging CT CAP done 01/29/2024. There is diminished size of hypodense mass of the pancreatic neck and diminished size and hypodensity of numerous liver metastases. There is a new pseudocirrhotic capsular retraction. All of these findings are consistent with treatment response. There is unchanged focal effacement and occlusion of the portal confluence. There is unchanged retroperitoneal lymph nodes.  She reports moderate fatigue.  Gradually, this is getting worse.  She states appetite has improved.  Generally improves as she gets closer to next chemotherapy treatment.  She has been getting constipated.  Drinking 4 to 5 ounces of apple juice every day seems to help that.  Denies nausea and vomiting.  Recently noticed some darkening of the skin of her fingers and hands.  Denies rashes, lesions, or dryness.  Reports getting significant headache and joint pain when she gets GSF injection. She denies chest pain, chest pressure, or shortness of breath. She denies headaches or visual disturbances. Her weight has increased 2 pounds over last 2 weeks .  I have reviewed the past medical history, past surgical history, social history and family history with the patient and they are unchanged from previous note.  ALLERGIES:  is allergic to irinotecan  liposome.  MEDICATIONS:  Current Outpatient Medications  Medication Sig Dispense Refill   apixaban  (ELIQUIS ) 5 MG TABS tablet Take 1 tablet (5 mg total) by mouth 2 (two) times daily. 60 tablet 3   cetirizine (ZYRTEC) 10 MG chewable tablet Chew 10 mg by mouth daily.     lidocaine -prilocaine  (EMLA ) cream Apply to affected area once (Patient taking differently: Apply 1 Application topically See admin instructions. Apply to affected area for port access) 30 g 3   ondansetron  (ZOFRAN ) 8 MG tablet Take 1 tablet (8 mg total) by mouth every 8 (eight) hours as needed for nausea or vomiting. Start on the third day after  irinotecan  30 tablet 1   prochlorperazine  (COMPAZINE ) 10 MG tablet Take 1 tablet (10 mg total) by mouth every 6 (six) hours as needed for nausea or vomiting. 30 tablet 1   No current facility-administered medications for this visit.   Facility-Administered Medications Ordered in Other Visits  Medication Dose Route Frequency Provider Last Rate Last Admin   atropine  injection 0.4 mg  0.4 mg Intravenous Once [name], MD       dextrose  5 % solution   Intravenous Continuous [name], MD 10 mL/hr at 02/03/24 1124 New Bag at 02/03/24 1124   fluorouracil  (ADRUCIL ) 5,000 mg in sodium chloride  0.9 % 150 mL chemo infusion  2,400 mg/m2 (Treatment Plan Recorded) Intravenous 1 day or 1 dose [name], MD       heparin  lock flush 100 unit/mL  500 Units Intracatheter Once PRN [name], MD       heparin  lock flush 100 unit/mL  500 Units Intracatheter Once PRN [name], MD       leucovorin  788 mg in dextrose  5 % 250 mL infusion  400 mg/m2 (Treatment Plan Recorded) Intravenous Once [name], MD 145 mL/hr at 02/03/24 1431 788 mg at 02/03/24 1431   oxaliplatin  (ELOXATIN ) 120 mg in dextrose  5 % 500 mL chemo infusion  60 mg/m2 (Treatment Plan Recorded) Intravenous Once [name], MD 262 mL/hr at 02/03/24 1430 120 mg at 02/03/24  1430   sodium chloride  flush (NS) 0.9 % injection 10 mL  10 mL Intracatheter PRN [name], MD       sodium chloride  flush (NS) 0.9 % injection 10 mL  10 mL Intracatheter PRN [name], MD        HISTORY OF PRESENT ILLNESS:   Oncology History  Malignant neoplasm of overlapping sites of left breast in female, estrogen receptor positive (HCC)  08/04/2018 Initial Diagnosis   Screening mammogram detected left breast mass at 9:30 position 1.2 x 1.2 x 1.1 cm.  Closer to the nipple at 9:30 position 0.8 cm lesion span 3.6 cm and a 1.5 cm apart.  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 position 2 cm from nipple, at 1 cm  from nipple PASH, lymph node benign, ER 90%, PR 95%, Ki-67 20%, HER-2 negative by IHC 1+, T1CN0 stage Ia AJCC 8   09/29/2018 Surgery   Left lumpectomy: Grade 3 IDC, 1.7 cm, margins negative, lymphovascular invasion present, 0/2 lymph nodes negative, T1c N0 stage Ia   10/06/2018 Cancer Staging   Staging form: Breast, AJCC 8th Edition - Pathologic: Stage IA (pT1c, pN0(sn), cM0, G3, ER+, PR+, HER2-) - Signed by [name], MD on 10/06/2018   10/22/2018 Oncotype testing   Oncotype score 13: Distant recurrence at 9 years with hormone therapy alone 4%   11/12/2018 - 12/27/2018 Radiation Therapy   Adjuvant radiation therapy   12/27/2018 -  Anti-estrogen oral therapy   Antiestrogen therapy with letrozole  2.5 mg daily   11/30/2023 Genetic Testing   Negative genetic testing on the CancerNext-Expanded+RNAinsight panel.  VUS identified in KIT c.2083G>C and PMS2 c.-1C>A.  The report date is 11/30/2023.  The CancerNext-Expanded gene panel offered by [facility] and includes sequencing, rearrangement, and RNA analysis for the following 76 genes: AIP, ALK, APC, ATM, AXIN2, BAP1, BARD1, BMPR1A, BRCA1, BRCA2, BRIP1, CDC73, CDH1, CDK4, CDKN1B, CDKN2A, CEBPA, CHEK2, CTNNA1, DDX41, DICER1, ETV6, FH, FLCN, GATA2, LZTR1, MAX, MBD4, MEN1, MET, MLH1, MSH2, MSH3, MSH6, MUTYH, NF1, NF2, NTHL1, PALB2, PHOX2B, PMS2, POT1, PRKAR1A, PTCH1, PTEN, RAD51C, RAD51D, RB1, RET, RUNX1, SDHA, SDHAF2, SDHB, SDHC, SDHD, SMAD4, SMARCA4, SMARCB1, SMARCE1, STK11, SUFU, TMEM127, TP53, TSC1, TSC2, VHL, and WT1 (sequencing and deletion/duplication); EGFR, HOXB13, KIT, MITF, PDGFRA, POLD1, and POLE (sequencing only); EPCAM and GREM1 (deletion/duplication only).    Pancreatic cancer (HCC)  11/03/2023 Initial Diagnosis   Pancreatic cancer (HCC)   11/11/2023 -  Chemotherapy   Patient is on Treatment Plan : PANCREAS NALIRIFOX D1, 15 Q28D     11/30/2023 Genetic Testing   Negative genetic testing on the CancerNext-Expanded+RNAinsight panel.   VUS identified in KIT c.2083G>C and PMS2 c.-1C>A.  The report date is 11/30/2023.  The CancerNext-Expanded gene panel offered by [facility] and includes sequencing, rearrangement, and RNA analysis for the following 76 genes: AIP, ALK, APC, ATM, AXIN2, BAP1, BARD1, BMPR1A, BRCA1, BRCA2, BRIP1, CDC73, CDH1, CDK4, CDKN1B, CDKN2A, CEBPA, CHEK2, CTNNA1, DDX41, DICER1, ETV6, FH, FLCN, GATA2, LZTR1, MAX, MBD4, MEN1, MET, MLH1, MSH2, MSH3, MSH6, MUTYH, NF1, NF2, NTHL1, PALB2, PHOX2B, PMS2, POT1, PRKAR1A, PTCH1, PTEN, RAD51C, RAD51D, RB1, RET, RUNX1, SDHA, SDHAF2, SDHB, SDHC, SDHD, SMAD4, SMARCA4, SMARCB1, SMARCE1, STK11, SUFU, TMEM127, TP53, TSC1, TSC2, VHL, and WT1 (sequencing and deletion/duplication); EGFR, HOXB13, KIT, MITF, PDGFRA, POLD1, and POLE (sequencing only); EPCAM and GREM1 (deletion/duplication only).    01/29/2024 Imaging   CT abdomen and pelvis with contrast  IMPRESSION: 1.  Diminished size of a hypodense mass of the pancreatic neck, consistent with treatment response. 2. Diminished size and hypodensity of numerous liver metastases, with new overlying pseudocirrhotic capsular retraction, consistent with treatment response. 3. 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 4. Unchanged prominent, nonspecific retroperitoneal lymph nodes.  5. Underlying hepatic steatosis.       REVIEW OF SYSTEMS:   Constitutional: Denies fevers, chills or abnormal weight loss Eyes: Denies blurriness of vision Ears, nose, mouth, throat, and face: Denies mucositis or sore throat Respiratory: Denies cough, dyspnea or wheezes Cardiovascular: Denies palpitation, chest discomfort or lower extremity swelling Gastrointestinal:  Denies nausea, heartburn or change in bowel habits Skin: Denies abnormal skin rashes Lymphatics: Denies new lymphadenopathy or easy bruising Neurological:Denies numbness, tingling or new weaknesses Behavioral/Psych: Mood is stable, no new changes  All other  systems were reviewed with the patient and are negative.   VITALS:   Today's Vitals   02/03/24 1023 02/03/24 1034  BP: 122/79   Pulse: 63   Resp: 18   Temp: 98 F (36.7 C)   TempSrc: Temporal   SpO2: 100%   Weight: 177 lb 11.2 oz (80.6 kg)   PainSc:  0-No pain   Body mass index is 33.03 kg/m.   Wt Readings from Last 3 Encounters:  02/03/24 177 lb 11.2 oz (80.6 kg)  01/20/24 175 lb (79.4 kg)  01/06/24 177 lb 4.8 oz (80.4 kg)    Body mass index is 33.03 kg/m.  Performance status (ECOG): 1 - Symptomatic but completely ambulatory  PHYSICAL EXAM:   GENERAL:alert, no distress and comfortable SKIN: skin color, texture, turgor are normal, no rashes or significant lesions EYES: normal, Conjunctiva are pink and non-injected, sclera clear OROPHARYNX:no exudate, no erythema and lips, buccal mucosa, and tongue normal  NECK: supple, thyroid normal size, non-tender, without nodularity LYMPH:  no palpable lymphadenopathy in the cervical, axillary or inguinal LUNGS: clear to auscultation and percussion with normal breathing effort HEART: regular rate & rhythm and no murmurs and no lower extremity edema ABDOMEN:abdomen soft, non-tender and normal bowel sounds Musculoskeletal:no cyanosis of digits and no clubbing  NEURO: alert & oriented x 3 with fluent speech, no focal motor/sensory deficits  LABORATORY DATA:  I have reviewed the data as listed    Component Value Date/Time   NA 138 02/03/2024 0942   NA 136 06/10/2022 1152   K 3.5 02/03/2024 0942   CL 105 02/03/2024 0942   CO2 27 02/03/2024 0942   GLUCOSE 114 (H) 02/03/2024 0942   BUN 7 (L) 02/03/2024 0942   BUN 7 (L) 06/10/2022 1152   CREATININE 0.63 02/03/2024 0942   CALCIUM  9.3 02/03/2024 0942   PROT 6.6 02/03/2024 0942   PROT 7.6 06/10/2022 1152   ALBUMIN 3.8 02/03/2024 0942   ALBUMIN 4.6 06/10/2022 1152   AST 18 02/03/2024 0942   ALT 11 02/03/2024 0942   ALKPHOS 150 (H) 02/03/2024 0942   BILITOT 0.4 02/03/2024 0942    GFRNONAA >60 02/03/2024 0942   Lab Results  Component Value Date   WBC 2.9 (L) 02/03/2024   NEUTROABS 1.5 (L) 02/03/2024   HGB 10.5 (L) 02/03/2024   HCT 32.8 (L) 02/03/2024   MCV 80.6 02/03/2024   PLT 214 02/03/2024     RADIOGRAPHIC STUDIES: CT ABDOMEN PELVIS W CONTRAST Result Date: 02/03/2024 CLINICAL DATA:  Metastatic pancreatic cancer, assess treatment response * Tracking Code: BO * EXAM: CT ABDOMEN AND PELVIS WITH CONTRAST TECHNIQUE: Multidetector CT imaging of the abdomen and pelvis was  performed using the standard protocol following bolus administration of intravenous contrast. RADIATION DOSE REDUCTION: This exam was performed according to the departmental dose-optimization program which includes automated exposure control, adjustment of the mA and/or kV according to patient size and/or use of iterative reconstruction technique. CONTRAST:  OMNIPAQUE  IOHEXOL  300 MG/ML  SOLN COMPARISON:  10/29/2023 FINDINGS: Lower chest: No acute abnormality. Hepatobiliary: Diminished size and hypodensity of numerous liver metastases, with new overlying pseudocirrhotic capsular retraction. Underlying hepatic steatosis. Index lesion of the anterior left lobe of the liver, hepatic segment II/III measures 1.5 x 1.2 cm, previously 3.2 x 2.6 cm (series 2, image 38). Index lesion of the posterior right lobe of the liver, hepatic segment VI/VII measures 1.6 x 1.2 cm, previously 2.9 x 2.4 cm (series 2, image 51). No gallstones, gallbladder wall thickening, or biliary dilatation. Pancreas: Diminished size of a hypodense mass of the pancreatic neck, measuring 2.1 x 1.5 cm, previously 3.0 x 2.6 cm when measured similarly (series 2, image 57). Mild atrophy and pancreatic ductal dilatation distally. Spleen: Normal in size without significant abnormality. Adrenals/Urinary Tract: Adrenal glands are unremarkable. Kidneys are normal, without renal calculi, solid lesion, or hydronephrosis. Bladder is unremarkable.  Stomach/Bowel: Stomach is within normal limits. Appendix appears normal. No evidence of bowel wall thickening, distention, or inflammatory changes. Vascular/Lymphatic: Scattered aortic atherosclerosis. Unchanged focal effacement and occlusion of the portal confluence (series 11, image 75). Unchanged prominent, nonspecific retroperitoneal lymph nodes (series eleven, image 117). Reproductive: No mass or other significant abnormality. Other: No abdominal wall hernia or abnormality. No ascites. Musculoskeletal: No acute or significant osseous findings. IMPRESSION: 1. Diminished size of a hypodense mass of the pancreatic neck, consistent with treatment response. 2. Diminished size and hypodensity of numerous liver metastases, with new overlying pseudocirrhotic capsular retraction, consistent with treatment response. 3. 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 4. Unchanged prominent, nonspecific retroperitoneal lymph nodes. 5. Underlying hepatic steatosis. Aortic Atherosclerosis (ICD10-I70.0). Electronically Signed   By: Marolyn JONETTA Jaksch M.D.   On: 02/03/2024 10:09   IR PATIENT EVAL TECH 0-60 MINS Result Date: 01/22/2024 Katha Harlene ORN, RT     01/22/2024  1:25 PM Patient seen in IR today, 01/22/2024, for [facility] Removal Site Evaluation following multiple hydrogel application visits.  Patient's bandage was removed and site was assessed.  Port Removal Site looked to be healing very well and closing up well, per K. Alvia, PA, another hydrogel application was not necessary at this time.  Patient's port removal site was then dressed with a regular band-aid, and patient was instructed to continue to keep site clean and dry, and to call IR if there are any concerns or issues with her Port Removal Site.   IR PATIENT EVAL TECH 0-60 MINS Result Date: 01/13/2024 Katha Harlene ORN, RT     01/13/2024  8:39 AM Patient was seen in IR today for [facility] Removal Site Evaluation and Hydrogel application.  Bandage was  removed and site was assessed.  Port site was cleaned, irrigated, and old hydrogel was removed.  New hydrogel was placed in port pocket and a bandage was placed over port site. Patient was instructed to continue keeping the site clean and dry and leave the bandage in place, and if she was to develop any symptoms of fever, chills, pain at site, or have any concerns, to please call IR. Patient is scheduled to return to IR on Friday, 01/22/2024 for another [facility] Removal Site evaluation and possibly another application of Hydrogel.   Addendum I  have seen the patient, examined her. I agree with the assessment and and plan and have edited the notes.   I personally reviewed her restaging CT scan from January 29, 2024, which showed excellent partial response in both pancreatic cancer and liver metastasis, no other new lesions.  I reviewed with patient and her husband.  Will continue current therapy, we discussed dose reduction on oxaliplatin  or stop oxaliplatin  after additional 2 to 3 months of treatment if she develops worsening neuropathy.  All questions were answered, I spent a total of 25 minutes for her visit today, more than 50% time on face-to-face counseling.  Ashley Clark  02/03/2024

## 2024-02-03 ENCOUNTER — Inpatient Hospital Stay: Payer: 59 | Attending: Hematology

## 2024-02-03 ENCOUNTER — Inpatient Hospital Stay: Payer: 59 | Admitting: Dietician

## 2024-02-03 ENCOUNTER — Inpatient Hospital Stay: Payer: 59

## 2024-02-03 ENCOUNTER — Inpatient Hospital Stay (HOSPITAL_BASED_OUTPATIENT_CLINIC_OR_DEPARTMENT_OTHER): Payer: 59 | Admitting: Nurse Practitioner

## 2024-02-03 VITALS — BP 122/79 | HR 63 | Temp 98.0°F | Resp 18 | Wt 177.7 lb

## 2024-02-03 DIAGNOSIS — Z5111 Encounter for antineoplastic chemotherapy: Secondary | ICD-10-CM | POA: Insufficient documentation

## 2024-02-03 DIAGNOSIS — C787 Secondary malignant neoplasm of liver and intrahepatic bile duct: Secondary | ICD-10-CM | POA: Insufficient documentation

## 2024-02-03 DIAGNOSIS — C259 Malignant neoplasm of pancreas, unspecified: Secondary | ICD-10-CM | POA: Diagnosis not present

## 2024-02-03 DIAGNOSIS — C251 Malignant neoplasm of body of pancreas: Secondary | ICD-10-CM

## 2024-02-03 DIAGNOSIS — Z5189 Encounter for other specified aftercare: Secondary | ICD-10-CM | POA: Insufficient documentation

## 2024-02-03 DIAGNOSIS — Z170 Estrogen receptor positive status [ER+]: Secondary | ICD-10-CM

## 2024-02-03 LAB — CMP (CANCER CENTER ONLY)
ALT: 11 U/L (ref 0–44)
AST: 18 U/L (ref 15–41)
Albumin: 3.8 g/dL (ref 3.5–5.0)
Alkaline Phosphatase: 150 U/L — ABNORMAL HIGH (ref 38–126)
Anion gap: 6 (ref 5–15)
BUN: 7 mg/dL — ABNORMAL LOW (ref 8–23)
CO2: 27 mmol/L (ref 22–32)
Calcium: 9.3 mg/dL (ref 8.9–10.3)
Chloride: 105 mmol/L (ref 98–111)
Creatinine: 0.63 mg/dL (ref 0.44–1.00)
GFR, Estimated: >60 mL/min (ref >60)
Glucose, Bld: 114 mg/dL — ABNORMAL HIGH (ref 70–99)
Potassium: 3.5 mmol/L (ref 3.5–5.1)
Sodium: 138 mmol/L (ref 135–145)
Total Bilirubin: 0.4 mg/dL (ref 0.0–1.2)
Total Protein: 6.6 g/dL (ref 6.5–8.1)

## 2024-02-03 LAB — CBC WITH DIFFERENTIAL (CANCER CENTER ONLY)
Abs Immature Granulocytes: 0.01 10*3/uL (ref 0.00–0.07)
Basophils Absolute: 0 10*3/uL (ref 0.0–0.1)
Basophils Relative: 1 %
Eosinophils Absolute: 0.1 10*3/uL (ref 0.0–0.5)
Eosinophils Relative: 2 %
HCT: 32.8 % — ABNORMAL LOW (ref 36.0–46.0)
Hemoglobin: 10.5 g/dL — ABNORMAL LOW (ref 12.0–15.0)
Immature Granulocytes: 0 %
Lymphocytes Relative: 27 %
Lymphs Abs: 0.8 10*3/uL (ref 0.7–4.0)
MCH: 25.8 pg — ABNORMAL LOW (ref 26.0–34.0)
MCHC: 32 g/dL (ref 30.0–36.0)
MCV: 80.6 fL (ref 80.0–100.0)
Monocytes Absolute: 0.6 10*3/uL (ref 0.1–1.0)
Monocytes Relative: 20 %
Neutro Abs: 1.5 10*3/uL — ABNORMAL LOW (ref 1.7–7.7)
Neutrophils Relative %: 50 %
Platelet Count: 214 10*3/uL (ref 150–400)
RBC: 4.07 MIL/uL (ref 3.87–5.11)
RDW: 20.7 % — ABNORMAL HIGH (ref 11.5–15.5)
WBC Count: 2.9 10*3/uL — ABNORMAL LOW (ref 4.0–10.5)
nRBC: 0 % (ref 0.0–0.2)

## 2024-02-03 MED ORDER — ATROPINE SULFATE 1 MG/ML IV SOLN: 0.4000 mg | Freq: Once | INTRAVENOUS | Status: DC

## 2024-02-03 MED ORDER — DEXAMETHASONE SODIUM PHOSPHATE 10 MG/ML IJ SOLN
10.0000 mg | Freq: Once | INTRAMUSCULAR | Status: AC
  Administered 2024-02-03: 10 mg via INTRAVENOUS
  Filled 2024-02-03: qty 1

## 2024-02-03 MED ORDER — FAMOTIDINE IN NACL 20-0.9 MG/50ML-% IV SOLN
20.0000 mg | Freq: Once | INTRAVENOUS | Status: AC
  Administered 2024-02-03: 20 mg via INTRAVENOUS
  Filled 2024-02-03: qty 50

## 2024-02-03 MED ORDER — PALONOSETRON HCL INJECTION 0.25 MG/5ML
0.2500 mg | Freq: Once | INTRAVENOUS | Status: AC
  Administered 2024-02-03: 0.25 mg via INTRAVENOUS
  Filled 2024-02-03: qty 5

## 2024-02-03 MED ORDER — LEUCOVORIN CALCIUM INJECTION 350 MG
400.0000 mg/m2 | Freq: Once | INTRAVENOUS | Status: AC
  Administered 2024-02-03: 788 mg via INTRAVENOUS
  Filled 2024-02-03: qty 39.4

## 2024-02-03 MED ORDER — SODIUM CHLORIDE 0.9 % IV SOLN
2400.0000 mg/m2 | INTRAVENOUS | Status: DC
  Administered 2024-02-03: 5000 mg via INTRAVENOUS
  Filled 2024-02-03: qty 100

## 2024-02-03 MED ORDER — DEXTROSE 5 % IV SOLN
INTRAVENOUS | Status: DC
Start: 2024-02-03 — End: 2024-02-03

## 2024-02-03 MED ORDER — SODIUM CHLORIDE 0.9% FLUSH: 10.0000 mL | INTRAVENOUS | Status: DC | PRN

## 2024-02-03 MED ORDER — SODIUM CHLORIDE 0.9 % IV SOLN
50.0000 mg/m2 | Freq: Once | INTRAVENOUS | Status: AC
  Administered 2024-02-03: 98.9 mg via INTRAVENOUS
  Filled 2024-02-03: qty 23

## 2024-02-03 MED ORDER — HEPARIN SOD (PORK) LOCK FLUSH 100 UNIT/ML IV SOLN: 500.0000 [IU] | Freq: Once | INTRAVENOUS | Status: DC | PRN

## 2024-02-03 MED ORDER — ATROPINE SULFATE 1 MG/ML IV SOLN
0.5000 mg | Freq: Once | INTRAVENOUS | Status: AC | PRN
  Administered 2024-02-03: 0.5 mg via INTRAVENOUS
  Filled 2024-02-03: qty 1

## 2024-02-03 MED ORDER — OXALIPLATIN CHEMO INJECTION 100 MG/20ML
60.0000 mg/m2 | Freq: Once | INTRAVENOUS | Status: AC
  Administered 2024-02-03: 120 mg via INTRAVENOUS
  Filled 2024-02-03: qty 20

## 2024-02-03 MED ORDER — SODIUM CHLORIDE 0.9% FLUSH
10.0000 mL | Freq: Once | INTRAVENOUS | Status: AC
Start: 2024-02-03 — End: 2024-02-03
  Administered 2024-02-03: 10 mL

## 2024-02-03 MED ORDER — HEPARIN SOD (PORK) LOCK FLUSH 100 UNIT/ML IV SOLN
500.0000 [IU] | Freq: Once | INTRAVENOUS | Status: AC
Start: 2024-02-03 — End: 2024-02-03
  Administered 2024-02-03: 500 [IU]

## 2024-02-03 NOTE — Patient Instructions (Signed)
 [facility] - A DEPT OF [facility]  Discharge Instructions: Thank you for choosing [facility] to provide your oncology and hematology care.   If you have a lab appointment with the Cancer Center, please go directly to the Cancer Center and check in at the registration area.   Wear comfortable clothing and clothing appropriate for easy access to any Portacath or PICC line.   We strive to give you quality time with your provider. You may need to reschedule your appointment if you arrive late (15 or more minutes).  Arriving late affects you and other patients whose appointments are after yours.  Also, if you miss three or more appointments without notifying the office, you may be dismissed from the clinic at the provider's discretion.      For prescription refill requests, have your pharmacy contact our office and allow 72 hours for refills to be completed.    Today you received the following chemotherapy and/or immunotherapy agents: Leucovorin, Oxaliplatin, Irinotecan      To help prevent nausea and vomiting after your treatment, we encourage you to take your nausea medication as directed.  BELOW ARE SYMPTOMS THAT SHOULD BE REPORTED IMMEDIATELY: *FEVER GREATER THAN 100.4 F (38 C) OR HIGHER *CHILLS OR SWEATING *NAUSEA AND VOMITING THAT IS NOT CONTROLLED WITH YOUR NAUSEA MEDICATION *UNUSUAL SHORTNESS OF BREATH *UNUSUAL BRUISING OR BLEEDING *URINARY PROBLEMS (pain or burning when urinating, or frequent urination) *BOWEL PROBLEMS (unusual diarrhea, constipation, pain near the anus) TENDERNESS IN MOUTH AND THROAT WITH OR WITHOUT PRESENCE OF ULCERS (sore throat, sores in mouth, or a toothache) UNUSUAL RASH, SWELLING OR PAIN  UNUSUAL VAGINAL DISCHARGE OR ITCHING   Items with * indicate a potential emergency and should be followed up as soon as possible or go to the Emergency Department if any problems should occur.  Please show the CHEMOTHERAPY  ALERT CARD or IMMUNOTHERAPY ALERT CARD at check-in to the Emergency Department and triage nurse.  Should you have questions after your visit or need to cancel or reschedule your appointment, please contact [facility] - A DEPT [facility]  Dept: [phone]  and follow the prompts.  Office hours are 8:00 a.m. to 4:30 p.m. Monday - Friday. Please note that voicemails left after 4:00 p.m. may not be returned until the following business day.  We are closed weekends and major holidays. You have access to a nurse at all times for urgent questions. Please call the main number to the clinic Dept: [phone] and follow the prompts.   For any non-urgent questions, you may also contact your provider using MyChart. We now offer e-Visits for anyone 35 and older to request care online for non-urgent symptoms. For details visit [email or web address].   Also download the MyChart app! Go to the app store, search "MyChart", open the app, select [facility], and log in with your MyChart username and password.

## 2024-02-03 NOTE — Progress Notes (Signed)
 Nutrition Follow-up:    Patient with stage IV pancreatic cancer with diffuse liver metastasis. Folfirinox discontinued after first cycle due to reaction. She is currently receiving Nalirifox q28d.    12/13-12/16 admission with port infection s/p removal  Met with patient in infusion. She reports doing well overall. Patient shares that cancer has responded well to treatment after review of images with Dr. Lanny today. She is thrilled. Patient reports despite altered taste and decreased appetite, she is pushing nutrition and maintaining weight. Patient denies nausea, vomiting, diarrhea, constipation.    Medications: reviewed   Labs: BUN 7  Anthropometrics: Wt 177 lb 4.8 oz today   1/22 - 175 lb  1/8 - 177 lb 4.8 oz  12/26 - 178 lb 8 oz    NUTRITION DIAGNOSIS: Unintentional wt loss - stable    INTERVENTION:  Continue strategies for increasing calories and protein with small frequent meals/snacks   MONITORING, EVALUATION, GOAL: wt trends, intake   NEXT VISIT: Wednesday March 5 during infusion

## 2024-02-05 ENCOUNTER — Inpatient Hospital Stay: Payer: 59

## 2024-02-05 VITALS — BP 124/86 | HR 78 | Temp 99.1°F | Resp 16

## 2024-02-05 DIAGNOSIS — Z5189 Encounter for other specified aftercare: Secondary | ICD-10-CM | POA: Diagnosis not present

## 2024-02-05 DIAGNOSIS — C251 Malignant neoplasm of body of pancreas: Secondary | ICD-10-CM

## 2024-02-05 DIAGNOSIS — C787 Secondary malignant neoplasm of liver and intrahepatic bile duct: Secondary | ICD-10-CM | POA: Diagnosis not present

## 2024-02-05 DIAGNOSIS — Z5111 Encounter for antineoplastic chemotherapy: Secondary | ICD-10-CM | POA: Diagnosis not present

## 2024-02-05 LAB — CANCER ANTIGEN 19-9: CA 19-9: 2765 U/mL — ABNORMAL HIGH (ref 0–35)

## 2024-02-05 MED ORDER — PEGFILGRASTIM-CBQV 6 MG/0.6ML ~~LOC~~ SOSY
6.0000 mg | PREFILLED_SYRINGE | Freq: Once | SUBCUTANEOUS | Status: AC
  Administered 2024-02-05: 6 mg via SUBCUTANEOUS
  Filled 2024-02-05: qty 0.6

## 2024-02-05 MED ORDER — HEPARIN SOD (PORK) LOCK FLUSH 100 UNIT/ML IV SOLN
500.0000 [IU] | Freq: Once | INTRAVENOUS | Status: AC | PRN
  Administered 2024-02-05: 500 [IU]

## 2024-02-05 MED ORDER — SODIUM CHLORIDE 0.9% FLUSH
10.0000 mL | INTRAVENOUS | Status: DC | PRN
Start: 2024-02-05 — End: 2024-02-05
  Administered 2024-02-05: 10 mL

## 2024-02-08 ENCOUNTER — Other Ambulatory Visit (HOSPITAL_COMMUNITY): Payer: Self-pay

## 2024-02-08 ENCOUNTER — Other Ambulatory Visit: Payer: Self-pay

## 2024-02-08 ENCOUNTER — Encounter: Payer: Self-pay | Admitting: Hematology

## 2024-02-11 ENCOUNTER — Other Ambulatory Visit: Payer: Self-pay

## 2024-02-11 DIAGNOSIS — C259 Malignant neoplasm of pancreas, unspecified: Secondary | ICD-10-CM | POA: Diagnosis not present

## 2024-02-15 ENCOUNTER — Other Ambulatory Visit: Payer: Self-pay

## 2024-02-16 ENCOUNTER — Other Ambulatory Visit: Payer: Self-pay

## 2024-02-16 DIAGNOSIS — C251 Malignant neoplasm of body of pancreas: Secondary | ICD-10-CM

## 2024-02-16 NOTE — Assessment & Plan Note (Signed)
-  stage IV with liver mets -diagnosed in 10/2023 -Patient has a history of breast cancer. She presents with dyspnea, chest discomfort, decreased appetite, and weight loss. She also reports new onset back pain. -CT showed a 2.9cm mass in pancreatic neck/body, and multiple liver mets, liver biopsy confirmed adenocarcinoma  -I reviewed the aggressive nature of pancreatic cancer, and the incurable nature of her disease due to diffuse liver metastasis. -she start first line chemo Nalirifox chemotherapy regimen on 11/12, with dose reduction for first cycle due to poor PS -Perform molecular testing on tumor tissue to identify potential targeted therapy options. -She underwent genetic testing and came back negative

## 2024-02-17 ENCOUNTER — Ambulatory Visit (HOSPITAL_COMMUNITY)
Admission: RE | Admit: 2024-02-17 | Discharge: 2024-02-17 | Disposition: A | Discharge status: Home / Self Care | Source: Ambulatory Visit | Attending: Physician Assistant | Admitting: Physician Assistant

## 2024-02-17 ENCOUNTER — Other Ambulatory Visit: Payer: Self-pay | Admitting: Physician Assistant

## 2024-02-17 ENCOUNTER — Inpatient Hospital Stay (HOSPITAL_BASED_OUTPATIENT_CLINIC_OR_DEPARTMENT_OTHER): Payer: 59 | Admitting: Hematology

## 2024-02-17 ENCOUNTER — Inpatient Hospital Stay: Payer: 59

## 2024-02-17 ENCOUNTER — Encounter: Payer: Self-pay | Admitting: Hematology

## 2024-02-17 ENCOUNTER — Other Ambulatory Visit: Payer: Self-pay

## 2024-02-17 VITALS — BP 101/62 | HR 80 | Temp 97.5°F | Resp 17 | Wt 174.2 lb

## 2024-02-17 DIAGNOSIS — Z5111 Encounter for antineoplastic chemotherapy: Secondary | ICD-10-CM | POA: Diagnosis not present

## 2024-02-17 DIAGNOSIS — C787 Secondary malignant neoplasm of liver and intrahepatic bile duct: Secondary | ICD-10-CM | POA: Diagnosis not present

## 2024-02-17 DIAGNOSIS — C251 Malignant neoplasm of body of pancreas: Secondary | ICD-10-CM

## 2024-02-17 DIAGNOSIS — Z170 Estrogen receptor positive status [ER+]: Secondary | ICD-10-CM

## 2024-02-17 DIAGNOSIS — Z5189 Encounter for other specified aftercare: Secondary | ICD-10-CM | POA: Diagnosis not present

## 2024-02-17 LAB — CMP (CANCER CENTER ONLY)
ALT: 14 U/L (ref 0–44)
AST: 22 U/L (ref 15–41)
Albumin: 3.9 g/dL (ref 3.5–5.0)
Alkaline Phosphatase: 191 U/L — ABNORMAL HIGH (ref 38–126)
Anion gap: 8 (ref 5–15)
BUN: 6 mg/dL — ABNORMAL LOW (ref 8–23)
CO2: 26 mmol/L (ref 22–32)
Calcium: 9.3 mg/dL (ref 8.9–10.3)
Chloride: 105 mmol/L (ref 98–111)
Creatinine: 0.57 mg/dL (ref 0.44–1.00)
GFR, Estimated: >60 mL/min (ref >60)
Glucose, Bld: 144 mg/dL — ABNORMAL HIGH (ref 70–99)
Potassium: 3.2 mmol/L — ABNORMAL LOW (ref 3.5–5.1)
Sodium: 139 mmol/L (ref 135–145)
Total Bilirubin: 0.3 mg/dL (ref 0.0–1.2)
Total Protein: 6.7 g/dL (ref 6.5–8.1)

## 2024-02-17 LAB — CBC WITH DIFFERENTIAL (CANCER CENTER ONLY)
Abs Immature Granulocytes: 0.04 10*3/uL (ref 0.00–0.07)
Basophils Absolute: 0 10*3/uL (ref 0.0–0.1)
Basophils Relative: 1 %
Eosinophils Absolute: 0 10*3/uL (ref 0.0–0.5)
Eosinophils Relative: 1 %
HCT: 36.7 % (ref 36.0–46.0)
Hemoglobin: 11.8 g/dL — ABNORMAL LOW (ref 12.0–15.0)
Immature Granulocytes: 1 %
Lymphocytes Relative: 26 %
Lymphs Abs: 1.1 10*3/uL (ref 0.7–4.0)
MCH: 26.5 pg (ref 26.0–34.0)
MCHC: 32.2 g/dL (ref 30.0–36.0)
MCV: 82.3 fL (ref 80.0–100.0)
Monocytes Absolute: 0.6 10*3/uL (ref 0.1–1.0)
Monocytes Relative: 14 %
Neutro Abs: 2.4 10*3/uL (ref 1.7–7.7)
Neutrophils Relative %: 57 %
Platelet Count: 177 10*3/uL (ref 150–400)
RBC: 4.46 MIL/uL (ref 3.87–5.11)
RDW: 20.1 % — ABNORMAL HIGH (ref 11.5–15.5)
WBC Count: 4.2 10*3/uL (ref 4.0–10.5)
nRBC: 0 % (ref 0.0–0.2)

## 2024-02-17 LAB — MISCELLANEOUS TEST

## 2024-02-17 MED ORDER — SODIUM CHLORIDE 0.9% FLUSH
10.0000 mL | Freq: Once | INTRAVENOUS | Status: AC
  Administered 2024-02-17: 10 mL

## 2024-02-17 MED ORDER — LIDOCAINE HCL 1 % IJ SOLN
20.0000 mL | Freq: Once | INTRAMUSCULAR | Status: AC
  Administered 2024-02-17: 10 mL

## 2024-02-17 MED ORDER — ALTEPLASE 2 MG IJ SOLR
2.0000 mg | Freq: Once | INTRAMUSCULAR | Status: AC
  Administered 2024-02-17: 2 mg
  Filled 2024-02-17: qty 2

## 2024-02-17 MED ORDER — LIDOCAINE HCL 1 % IJ SOLN
INTRAMUSCULAR | Status: AC
  Filled 2024-02-17: qty 20

## 2024-02-17 MED ORDER — HEPARIN SOD (PORK) LOCK FLUSH 100 UNIT/ML IV SOLN
INTRAVENOUS | Status: AC
  Filled 2024-02-17: qty 5

## 2024-02-17 MED ORDER — SODIUM CHLORIDE 0.9% FLUSH
10.0000 mL | Freq: Once | INTRAVENOUS | Status: AC
Start: 2024-02-17 — End: 2024-02-17
  Administered 2024-02-17: 10 mL

## 2024-02-17 MED ORDER — HEPARIN SOD (PORK) LOCK FLUSH 100 UNIT/ML IV SOLN: 500.0000 [IU] | Freq: Once | INTRAVENOUS | Status: DC

## 2024-02-17 MED ORDER — HEPARIN SOD (PORK) LOCK FLUSH 100 UNIT/ML IV SOLN
500.0000 [IU] | Freq: Once | INTRAVENOUS | Status: AC
  Administered 2024-02-17: 500 [IU] via INTRAVENOUS

## 2024-02-17 NOTE — Progress Notes (Signed)
[facility]   [phone] [phone]   Clinic Follow up Note   Patient Care Team: [name], PA as PCP - General (Nurse Practitioner) [name], MD as Consulting Physician (Hematology and Oncology) [name], MD as Consulting Physician (Radiation Oncology) [name], MD as Consulting Physician (General Surgery) [name], MD as Consulting Physician (Hematology and Oncology)  Date of Service:  02/17/2024  CHIEF COMPLAINT: f/u of pancreatic cancer  CURRENT THERAPY:  This line chemotherapy NALIRIFOX   Oncology History   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 She presents with dyspnea, chest discomfort, decreased appetite, and weight loss. She also reports new onset back pain. -CT showed a 2.9cm mass in pancreatic neck/body, and multiple liver mets, liver biopsy confirmed adenocarcinoma  -I reviewed the aggressive nature of pancreatic cancer, and the incurable nature of her disease due to diffuse liver metastasis. -she start first line chemo Nalirifox chemotherapy regimen on 11/12, with dose reduction for first cycle due to poor PS -Perform molecular testing on tumor tissue to identify potential targeted therapy options. -She underwent genetic testing and came back negative     Assessment and Plan    Metastatic Pancreatic Cancer Undergoing chemotherapy with good tolerance. Main concern is mild neuropathy from oxaliplatin, rated 5/10, without significant functional impairment. Discussed continuing chemotherapy as long as effective and tolerable. Plan to reduce oxaliplatin dose to manage neuropathy. Treatment with oxaliplatin typically does not exceed six months. Likely to stop oxaliplatin after the next scan. - Proceed with chemotherapy today. - Reduce oxaliplatin dose. - Schedule next chemotherapy for March 5th. - Schedule dressing changes for March 12th  and March 26th.  Chemotherapy-Induced Neuropathy Mild tingling neuropathy, rated 5/10, intermittent and not significantly impairing daily activities. Discussed potential for worsening neuropathy and difficulty in reversing it once established. Decision made to reduce oxaliplatin dose while monitoring symptoms. - Reduce oxaliplatin dose. - Monitor neuropathy symptoms and adjust treatment as necessary.  General Health Maintenance Managing appointments and treatment schedule well. Discussed need to reschedule PICC line dressing changes to align with treatment days. - Reschedule PICC line dressing change to Thursday. - Ensure future dressing changes are scheduled appropriately.  Follow-up - Update FMLA paperwork to extend disability leave. - Confirm appointments with scheduling department.     Plan -Lab reviewed, adequate for treatment, will proceed chemotherapy today with further dose reduction on oxaliplatin due to her neuropathy -Continue chemotherapy and follow-up every 2 weeks    SUMMARY OF ONCOLOGIC HISTORY: Oncology History  Malignant neoplasm of overlapping sites of left breast in female, estrogen receptor positive (HCC)  08/04/2018 Initial Diagnosis   Screening mammogram detected left breast mass at 9:30 position 1.2 x 1.2 x 1.1 cm.  Closer to the nipple at 9:30 position 0.8 cm lesion span 3.6 cm and a 1.5 cm apart.  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 position 2 cm from nipple, at 1 cm from nipple PASH, lymph node benign, ER 90%, PR 95%, Ki-67 20%, HER-2 negative by IHC 1+, T1CN0 stage Ia AJCC 8   09/29/2018 Surgery   Left lumpectomy: Grade 3 IDC, 1.7 cm, margins negative, lymphovascular invasion present, 0/2 lymph nodes negative, T1c N0 stage Ia   10/06/2018 Cancer Staging   Staging form: Breast, AJCC 8th Edition - Pathologic: Stage IA (pT1c, pN0(sn), cM0, G3, ER+, PR+, HER2-) -  Signed by [name], MD on 10/06/2018   10/22/2018 Oncotype testing   Oncotype score 13: Distant recurrence at 9 years with hormone therapy alone 4%   11/12/2018 - 12/27/2018 Radiation Therapy   Adjuvant radiation therapy   12/27/2018 -  Anti-estrogen oral therapy   Antiestrogen therapy with letrozole 2.5 mg daily   11/30/2023 Genetic Testing   Negative genetic testing on the CancerNext-Expanded+RNAinsight panel.  VUS identified in KIT c.2083G>C and PMS2 c.-1C>A.  The report date is 11/30/2023.  The CancerNext-Expanded gene panel offered by [facility] and includes sequencing, rearrangement, and RNA analysis for the following 76 genes: AIP, ALK, APC, ATM, AXIN2, BAP1, BARD1, BMPR1A, BRCA1, BRCA2, BRIP1, CDC73, CDH1, CDK4, CDKN1B, CDKN2A, CEBPA, CHEK2, CTNNA1, DDX41, DICER1, ETV6, FH, FLCN, GATA2, LZTR1, MAX, MBD4, MEN1, MET, MLH1, MSH2, MSH3, MSH6, MUTYH, NF1, NF2, NTHL1, PALB2, PHOX2B, PMS2, POT1, PRKAR1A, PTCH1, PTEN, RAD51C, RAD51D, RB1, RET, RUNX1, SDHA, SDHAF2, SDHB, SDHC, SDHD, SMAD4, SMARCA4, SMARCB1, SMARCE1, STK11, SUFU, TMEM127, TP53, TSC1, TSC2, VHL, and WT1 (sequencing and deletion/duplication); EGFR, HOXB13, KIT, MITF, PDGFRA, POLD1, and POLE (sequencing only); EPCAM and GREM1 (deletion/duplication only).    Pancreatic cancer (HCC)  11/03/2023 Initial Diagnosis   Pancreatic cancer (HCC)   11/11/2023 -  Chemotherapy   Patient is on Treatment Plan : PANCREAS NALIRIFOX D1, 15 Q28D     11/30/2023 Genetic Testing   Negative genetic testing on the CancerNext-Expanded+RNAinsight panel.  VUS identified in KIT c.2083G>C and PMS2 c.-1C>A.  The report date is 11/30/2023.  The CancerNext-Expanded gene panel offered by [facility] and includes sequencing, rearrangement, and RNA analysis for the following 76 genes: AIP, ALK, APC, ATM, AXIN2, BAP1, BARD1, BMPR1A, BRCA1, BRCA2, BRIP1, CDC73, CDH1, CDK4, CDKN1B, CDKN2A, CEBPA, CHEK2, CTNNA1, DDX41, DICER1, ETV6, FH, FLCN, GATA2, LZTR1, MAX, MBD4, MEN1, MET,  MLH1, MSH2, MSH3, MSH6, MUTYH, NF1, NF2, NTHL1, PALB2, PHOX2B, PMS2, POT1, PRKAR1A, PTCH1, PTEN, RAD51C, RAD51D, RB1, RET, RUNX1, SDHA, SDHAF2, SDHB, SDHC, SDHD, SMAD4, SMARCA4, SMARCB1, SMARCE1, STK11, SUFU, TMEM127, TP53, TSC1, TSC2, VHL, and WT1 (sequencing and deletion/duplication); EGFR, HOXB13, KIT, MITF, PDGFRA, POLD1, and POLE (sequencing only); EPCAM and GREM1 (deletion/duplication only).    01/29/2024 Imaging   CT abdomen and pelvis with contrast  IMPRESSION: 1. Diminished size of a hypodense mass of the pancreatic neck, consistent with treatment response. 2. Diminished size and hypodensity of numerous liver metastases, with new overlying pseudocirrhotic capsular retraction, consistent with treatment response. 3. 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 4. Unchanged prominent, nonspecific retroperitoneal lymph nodes.  5. Underlying hepatic steatosis.      Discussed the use of AI scribe software for clinical note transcription with the patient, who gave verbal consent to proceed.  History of Present Illness   The patient, a 63 year old female with metastatic pancreatic cancer, presents for a follow-up visit. She reports no problems with the last cycle of chemotherapy. She has a PICC line in place, but there have been scheduling issues with the dressing changes. She has started to experience neuropathy, described as a tingling sensation, which she rates as a 5 out of 10 in severity. The neuropathy is not constant but comes and goes. She has not experienced any issues with dropping things or loss of sensation. She is also dealing with issues related to her disability end date and is considering whether to extend it due to the fatigue associated with ongoing chemotherapy.         All other systems were reviewed with the patient and are negative.  MEDICAL HISTORY:  Past Medical History:  Diagnosis Date   Acute pulmonary embolism (HCC) 10/29/2023   Allergy     Back pain    Cancer (HCC) 2019   left breast cancer-last radiation Dec.2019   Diabetes mellitus without complication [facility])    Medical history non-contributory    Personal history of radiation therapy    Port-A-Cath in place - removed 12-11-2023 due to port-a-cath infection. 11/13/2023   Prediabetes    Tiredness    Vitamin D deficiency     SURGICAL HISTORY: Past Surgical History:  Procedure Laterality Date   BREAST BIOPSY Left 08/04/2018   x3   BREAST LUMPECTOMY Left    09-29-18   BREAST LUMPECTOMY WITH RADIOACTIVE SEED AND SENTINEL LYMPH NODE BIOPSY Left 09/29/2018   Procedure: LEFT BREAST LUMPECTOMY WITH RADIOACTIVE SEED AND SENTINEL LYMPH NODE BIOPSY;  Surgeon: [name], MD;  Location:  SURGERY CENTER;  Service: General;  Laterality: Left;   COLONOSCOPY  2016   IR IMAGING GUIDED PORT INSERTION  11/03/2023   IR PATIENT EVAL TECH 0-60 MINS  12/18/2023   IR PATIENT EVAL TECH 0-60 MINS  12/24/2023   IR PATIENT EVAL TECH 0-60 MINS  12/28/2023   IR PATIENT EVAL TECH 0-60 MINS  01/04/2024   IR PATIENT EVAL TECH 0-60 MINS  01/13/2024   IR PATIENT EVAL TECH 0-60 MINS  01/22/2024   IR RADIOLOGIST EVAL & MGMT  12/15/2023   IR REMOVAL TUN ACCESS W/ PORT W/O FL MOD SED  12/11/2023   POLYPECTOMY     WISDOM TOOTH EXTRACTION      I have reviewed the social history and family history with the patient and they are unchanged from previous note.  ALLERGIES:  is allergic to irinotecan liposome.  MEDICATIONS:  Current Outpatient Medications  Medication Sig Dispense Refill   apixaban (ELIQUIS) 5 MG TABS tablet Take 1 tablet (5 mg total) by mouth 2 (two) times daily. 60 tablet 3   cetirizine (ZYRTEC) 10 MG chewable tablet Chew 10 mg by mouth daily.     lidocaine-prilocaine (EMLA) cream Apply to affected area once (Patient taking differently: Apply 1 Application topically See admin instructions. Apply to affected area for port access) 30 g 3   ondansetron (ZOFRAN) 8 MG tablet Take 1  tablet (8 mg total) by mouth every 8 (eight) hours as needed for nausea or vomiting. Start on the third day after irinotecan 30 tablet 1   prochlorperazine (COMPAZINE) 10 MG tablet Take 1 tablet (10 mg total) by mouth every 6 (six) hours as needed for nausea or vomiting. 30 tablet 1   No current facility-administered medications for this visit.   Facility-Administered Medications Ordered in Other Visits  Medication Dose Route Frequency Provider Last Rate Last Admin   heparin lock flush 100 unit/mL  500 Units Intracatheter Once PRN [name], MD       heparin lock flush 100 unit/mL  500 Units Intracatheter Once [name], MD       sodium chloride flush (NS) 0.9 % injection 10 mL  10 mL Intracatheter PRN [name], MD        PHYSICAL EXAMINATION: ECOG PERFORMANCE STATUS: 1 - Symptomatic but completely ambulatory  Vitals:   02/17/24 1024  BP: 101/62  Pulse: 80  Resp: 17  Temp: (!) 97.5 F (36.4 C)  SpO2: 100%   Wt Readings from Last 3 Encounters:  02/17/24 174 lb 3.2 oz (79 kg)  02/03/24 177 lb 11.2 oz (80.6 kg)  01/20/24 175  lb (79.4 kg)     GENERAL:alert, no distress and comfortable SKIN: skin color, texture, turgor are normal, no rashes or significant lesions EYES: normal, Conjunctiva are pink and non-injected, sclera clear NECK: supple, thyroid normal size, non-tender, without nodularity LYMPH:  no palpable lymphadenopathy in the cervical, axillary  LUNGS: clear to auscultation and percussion with normal breathing effort HEART: regular rate & rhythm and no murmurs and no lower extremity edema ABDOMEN:abdomen soft, non-tender and normal bowel sounds Musculoskeletal:no cyanosis of digits and no clubbing  NEURO: alert & oriented x 3 with fluent speech, no focal motor/sensory deficits    LABORATORY DATA:  I have reviewed the data as listed    Latest Ref Rng & Units 02/17/2024    9:28 AM 02/03/2024    9:42 AM 01/20/2024    9:52 AM  CBC  WBC 4.0 - 10.5 K/uL 4.2  2.9  8.1    Hemoglobin 12.0 - 15.0 g/dL 16.1  09.6  04.5   Hematocrit 36.0 - 46.0 % 36.7  32.8  34.2   Platelets 150 - 400 K/uL 177  214  251         Latest Ref Rng & Units 02/17/2024    9:28 AM 02/03/2024    9:42 AM 01/20/2024    9:52 AM  CMP  Glucose 70 - 99 mg/dL 409  811  914   BUN 8 - 23 mg/dL 6  7  7    Creatinine 0.44 - 1.00 mg/dL 7.82  9.56  2.13   Sodium 135 - 145 mmol/L 139  138  138   Potassium 3.5 - 5.1 mmol/L 3.2  3.5  3.3   Chloride 98 - 111 mmol/L 105  105  103   CO2 22 - 32 mmol/L 26  27  28    Calcium 8.9 - 10.3 mg/dL 9.3  9.3  9.5   Total Protein 6.5 - 8.1 g/dL 6.7  6.6  6.8   Total Bilirubin 0.0 - 1.2 mg/dL 0.3  0.4  0.4   Alkaline Phos 38 - 126 U/L 191  150  168   AST 15 - 41 U/L 22  18  19    ALT 0 - 44 U/L 14  11  11        RADIOGRAPHIC STUDIES: I have personally reviewed the radiological images as listed and agreed with the findings in the report. No results found.    Orders Placed This Encounter  Procedures   CBC with Differential (Cancer Center Only)    Standing Status:   Future    Expected Date:   03/30/2024    Expiration Date:   03/30/2025   CMP (Cancer Center only)    Standing Status:   Future    Expected Date:   03/30/2024    Expiration Date:   03/30/2025   CBC with Differential (Cancer Center Only)    Standing Status:   Future    Expected Date:   04/13/2024    Expiration Date:   04/13/2025   CMP (Cancer Center only)    Standing Status:   Future    Expected Date:   04/13/2024    Expiration Date:   04/13/2025   All questions were answered. The patient knows to call the clinic with any problems, questions or concerns. No barriers to learning was detected. The total time spent in the appointment was 25 minutes.     [name], MD 02/17/2024

## 2024-02-18 ENCOUNTER — Inpatient Hospital Stay: Payer: 59

## 2024-02-18 VITALS — BP 108/62 | HR 72 | Temp 98.2°F | Resp 18

## 2024-02-18 DIAGNOSIS — C259 Malignant neoplasm of pancreas, unspecified: Secondary | ICD-10-CM | POA: Diagnosis not present

## 2024-02-18 DIAGNOSIS — C787 Secondary malignant neoplasm of liver and intrahepatic bile duct: Secondary | ICD-10-CM | POA: Diagnosis not present

## 2024-02-18 DIAGNOSIS — Z5189 Encounter for other specified aftercare: Secondary | ICD-10-CM | POA: Diagnosis not present

## 2024-02-18 DIAGNOSIS — Z5111 Encounter for antineoplastic chemotherapy: Secondary | ICD-10-CM | POA: Diagnosis not present

## 2024-02-18 DIAGNOSIS — C251 Malignant neoplasm of body of pancreas: Secondary | ICD-10-CM | POA: Diagnosis not present

## 2024-02-18 MED ORDER — ATROPINE SULFATE 1 MG/ML IV SOLN
0.4000 mg | Freq: Once | INTRAVENOUS | Status: AC
  Administered 2024-02-18: 0.4 mg via INTRAVENOUS
  Filled 2024-02-18: qty 1

## 2024-02-18 MED ORDER — PALONOSETRON HCL INJECTION 0.25 MG/5ML
0.2500 mg | Freq: Once | INTRAVENOUS | Status: AC
  Administered 2024-02-18: 0.25 mg via INTRAVENOUS
  Filled 2024-02-18: qty 5

## 2024-02-18 MED ORDER — DEXTROSE 5 % IV SOLN: INTRAVENOUS | Status: DC

## 2024-02-18 MED ORDER — DEXTROSE 5 % IV SOLN
50.0000 mg/m2 | Freq: Once | INTRAVENOUS | Status: AC
  Administered 2024-02-18: 100 mg via INTRAVENOUS
  Filled 2024-02-18: qty 20

## 2024-02-18 MED ORDER — DEXAMETHASONE SODIUM PHOSPHATE 10 MG/ML IJ SOLN
10.0000 mg | Freq: Once | INTRAMUSCULAR | Status: AC
  Administered 2024-02-18: 10 mg via INTRAVENOUS
  Filled 2024-02-18: qty 1

## 2024-02-18 MED ORDER — SODIUM CHLORIDE 0.9 % IV SOLN
50.0000 mg/m2 | Freq: Once | INTRAVENOUS | Status: AC
  Administered 2024-02-18: 98.9 mg via INTRAVENOUS
  Filled 2024-02-18: qty 23

## 2024-02-18 MED ORDER — FAMOTIDINE IN NACL 20-0.9 MG/50ML-% IV SOLN
20.0000 mg | Freq: Once | INTRAVENOUS | Status: AC
  Administered 2024-02-18: 20 mg via INTRAVENOUS
  Filled 2024-02-18: qty 50

## 2024-02-18 MED ORDER — SODIUM CHLORIDE 0.9 % IV SOLN
2400.0000 mg/m2 | INTRAVENOUS | Status: DC
  Administered 2024-02-18: 5000 mg via INTRAVENOUS
  Filled 2024-02-18: qty 100

## 2024-02-18 MED ORDER — LEUCOVORIN CALCIUM INJECTION 350 MG
400.0000 mg/m2 | Freq: Once | INTRAVENOUS | Status: AC
  Administered 2024-02-18: 788 mg via INTRAVENOUS
  Filled 2024-02-18: qty 17.5

## 2024-02-18 NOTE — Patient Instructions (Signed)
 [facility] - A DEPT OF [facility]  Discharge Instructions: Thank you for choosing [facility] to provide your oncology and hematology care.   If you have a lab appointment with the Cancer Center, please go directly to the Cancer Center and check in at the registration area.   Wear comfortable clothing and clothing appropriate for easy access to any Portacath or PICC line.   We strive to give you quality time with your provider. You may need to reschedule your appointment if you arrive late (15 or more minutes).  Arriving late affects you and other patients whose appointments are after yours.  Also, if you miss three or more appointments without notifying the office, you may be dismissed from the clinic at the provider's discretion.      For prescription refill requests, have your pharmacy contact our office and allow 72 hours for refills to be completed.    Today you received the following chemotherapy and/or immunotherapy agents: Leucovorin, Oxaliplatin, Irinotecan      To help prevent nausea and vomiting after your treatment, we encourage you to take your nausea medication as directed.  BELOW ARE SYMPTOMS THAT SHOULD BE REPORTED IMMEDIATELY: *FEVER GREATER THAN 100.4 F (38 C) OR HIGHER *CHILLS OR SWEATING *NAUSEA AND VOMITING THAT IS NOT CONTROLLED WITH YOUR NAUSEA MEDICATION *UNUSUAL SHORTNESS OF BREATH *UNUSUAL BRUISING OR BLEEDING *URINARY PROBLEMS (pain or burning when urinating, or frequent urination) *BOWEL PROBLEMS (unusual diarrhea, constipation, pain near the anus) TENDERNESS IN MOUTH AND THROAT WITH OR WITHOUT PRESENCE OF ULCERS (sore throat, sores in mouth, or a toothache) UNUSUAL RASH, SWELLING OR PAIN  UNUSUAL VAGINAL DISCHARGE OR ITCHING   Items with * indicate a potential emergency and should be followed up as soon as possible or go to the Emergency Department if any problems should occur.  Please show the CHEMOTHERAPY  ALERT CARD or IMMUNOTHERAPY ALERT CARD at check-in to the Emergency Department and triage nurse.  Should you have questions after your visit or need to cancel or reschedule your appointment, please contact [facility] - A DEPT [facility]  Dept: [phone]  and follow the prompts.  Office hours are 8:00 a.m. to 4:30 p.m. Monday - Friday. Please note that voicemails left after 4:00 p.m. may not be returned until the following business day.  We are closed weekends and major holidays. You have access to a nurse at all times for urgent questions. Please call the main number to the clinic Dept: [phone] and follow the prompts.   For any non-urgent questions, you may also contact your provider using MyChart. We now offer e-Visits for anyone 35 and older to request care online for non-urgent symptoms. For details visit [email or web address].   Also download the MyChart app! Go to the app store, search "MyChart", open the app, select [facility], and log in with your MyChart username and password.

## 2024-02-19 ENCOUNTER — Other Ambulatory Visit: Payer: Self-pay

## 2024-02-19 ENCOUNTER — Inpatient Hospital Stay: Payer: 59

## 2024-02-20 ENCOUNTER — Inpatient Hospital Stay: Payer: 59

## 2024-02-20 ENCOUNTER — Other Ambulatory Visit (HOSPITAL_COMMUNITY): Payer: Self-pay

## 2024-02-20 VITALS — BP 118/79 | HR 82 | Temp 98.5°F | Resp 16

## 2024-02-20 DIAGNOSIS — C787 Secondary malignant neoplasm of liver and intrahepatic bile duct: Secondary | ICD-10-CM | POA: Diagnosis not present

## 2024-02-20 DIAGNOSIS — Z5111 Encounter for antineoplastic chemotherapy: Secondary | ICD-10-CM | POA: Diagnosis not present

## 2024-02-20 DIAGNOSIS — C251 Malignant neoplasm of body of pancreas: Secondary | ICD-10-CM | POA: Diagnosis not present

## 2024-02-20 DIAGNOSIS — Z5189 Encounter for other specified aftercare: Secondary | ICD-10-CM | POA: Diagnosis not present

## 2024-02-20 MED ORDER — PEGFILGRASTIM-CBQV 6 MG/0.6ML ~~LOC~~ SOSY
6.0000 mg | PREFILLED_SYRINGE | Freq: Once | SUBCUTANEOUS | Status: AC
  Administered 2024-02-20: 6 mg via SUBCUTANEOUS
  Filled 2024-02-20: qty 0.6

## 2024-02-20 MED ORDER — SODIUM CHLORIDE 0.9% FLUSH
10.0000 mL | INTRAVENOUS | Status: DC | PRN
  Administered 2024-02-20: 10 mL

## 2024-02-20 MED ORDER — HEPARIN SOD (PORK) LOCK FLUSH 100 UNIT/ML IV SOLN
500.0000 [IU] | Freq: Once | INTRAVENOUS | Status: AC | PRN
  Administered 2024-02-20: 500 [IU]

## 2024-02-24 ENCOUNTER — Encounter

## 2024-02-25 ENCOUNTER — Inpatient Hospital Stay: Payer: 59

## 2024-02-25 DIAGNOSIS — Z5189 Encounter for other specified aftercare: Secondary | ICD-10-CM | POA: Diagnosis not present

## 2024-02-25 DIAGNOSIS — Z5111 Encounter for antineoplastic chemotherapy: Secondary | ICD-10-CM | POA: Diagnosis not present

## 2024-02-25 DIAGNOSIS — C251 Malignant neoplasm of body of pancreas: Secondary | ICD-10-CM | POA: Diagnosis not present

## 2024-02-25 DIAGNOSIS — C787 Secondary malignant neoplasm of liver and intrahepatic bile duct: Secondary | ICD-10-CM | POA: Diagnosis not present

## 2024-02-25 DIAGNOSIS — C50812 Malignant neoplasm of overlapping sites of left female breast: Secondary | ICD-10-CM

## 2024-02-25 MED ORDER — HEPARIN SOD (PORK) LOCK FLUSH 100 UNIT/ML IV SOLN
500.0000 [IU] | Freq: Once | INTRAVENOUS | Status: AC
  Administered 2024-02-25: 500 [IU]

## 2024-02-25 MED ORDER — SODIUM CHLORIDE 0.9% FLUSH
10.0000 mL | Freq: Once | INTRAVENOUS | Status: AC
  Administered 2024-02-25: 10 mL

## 2024-02-28 ENCOUNTER — Other Ambulatory Visit: Payer: Self-pay | Admitting: Nurse Practitioner

## 2024-02-28 DIAGNOSIS — C251 Malignant neoplasm of body of pancreas: Secondary | ICD-10-CM

## 2024-02-28 NOTE — Progress Notes (Unsigned)
 Patient Care Team: [name], [name] as PCP - General (Nurse Practitioner) [name], MD as Consulting Physician (Hematology and Oncology) [name], MD as Consulting Physician (Radiation Oncology) [name], MD as Consulting Physician (General Surgery) [name], MD as Consulting Physician (Hematology and Oncology)  Clinic Day:  03/02/2024  Referring physician: [name], MD  ASSESSMENT & PLAN:   Assessment & Plan: Pancreatic cancer [facility]) -stage IV with liver mets -diagnosed in 10/2023 -Patient has a history of breast cancer. She presents with dyspnea, chest discomfort, decreased appetite, and weight loss. She also reports new onset back pain. -CT showed a 2.9cm mass in pancreatic neck/body, and multiple liver mets, liver biopsy confirmed adenocarcinoma  -I reviewed the aggressive nature of pancreatic cancer, and the incurable nature of her disease due to diffuse liver metastasis. -she start first line chemo Nalirifox chemotherapy regimen on 11/12, with dose reduction for first cycle due to poor PS -Perform molecular testing on tumor tissue to identify potential targeted therapy options. -She underwent genetic testing and came back negative  -Dose reduced oxaliplatin to 50 mg/m at cycle 4 due to progressive neuropathy. -03/02/2024 -cycle 5 day 1 of first-line chemotherapy NALIRIFOX with reduced dose oxaliplatin at 50 mg/m. Continue weekly PICC line dressing changes and flush. Continue with chemotherapy every 2 weeks.   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 She reports neuropathy has stabilized, no better, but now worse.  Will continue with current dose oxaliplatin. Offered trial of gabapentin to take as needed for neuropathy.  She declined for now but understands she can change her mind and we can do trial at any point.   Plan:  Labs reviewed. -CBC and CMP are adequate for  treatment today. -CA 19-9 is pending. Proceed with cycle 5 day 1 chemotherapy NALIRIFOX oxaliplatin dose at 50 mg/m. Will meet with nutritionist today during chemotherapy treatment. Continue with PICC line flush with dressing changes weekly as scheduled. Labs/flush, follow-up, and treatment as scheduled in 2 weeks.  The patient understands the plans discussed today and is in agreement with them.  She knows to contact our office if she develops concerns prior to her next appointment.  I provided 25 minutes of face-to-face time during this encounter and > 50% was spent counseling as documented under my assessment and plan.    Ashley Jews, NP  [facility] - A DEPT OF [facility] [address] [place] [address] Dept: [phone] Dept Fax: [phone]   Orders Placed This Encounter  Procedures   CBC with Differential (Cancer Center Only)    Standing Status:   Future    Expected Date:   04/27/2024    Expiration Date:   04/27/2025   CMP (Cancer Center only)    Standing Status:   Future    Expected Date:   04/27/2024    Expiration Date:   04/27/2025   CBC with Differential (Cancer Center Only)    Standing Status:   Future    Expected Date:   05/11/2024    Expiration Date:   05/11/2025   CMP (Cancer Center only)    Standing Status:   Future    Expected Date:   05/11/2024    Expiration Date:   05/11/2025   CBC with Differential (Cancer Center Only)    Standing Status:   Future    Expected Date:   05/25/2024  Expiration Date:   05/25/2025   CMP (Cancer Center only)    Standing Status:   Future    Expected Date:   05/25/2024    Expiration Date:   05/25/2025   CBC with Differential (Cancer Center Only)    Standing Status:   Future    Expected Date:   06/08/2024    Expiration Date:   06/08/2025   CMP (Cancer Center only)    Standing Status:   Future    Expected Date:   06/08/2024    Expiration Date:   06/08/2025    CBC with Differential (Cancer Center Only)    Standing Status:   Future    Expected Date:   06/22/2024    Expiration Date:   06/22/2025   CMP (Cancer Center only)    Standing Status:   Future    Expected Date:   06/22/2024    Expiration Date:   06/22/2025   CBC with Differential (Cancer Center Only)    Standing Status:   Future    Expected Date:   07/06/2024    Expiration Date:   07/06/2025   CMP (Cancer Center only)    Standing Status:   Future    Expected Date:   07/06/2024    Expiration Date:   07/06/2025      CHIEF COMPLAINT:  CC: Follow-up of pancreatic cancer  Current Treatment: First-line chemotherapy NALIRIFOX  INTERVAL HISTORY:  Ashley Clark is here today for repeat clinical assessment.  She was last seen by Dr. Mosetta Putt on 02/17/2024.  She did have reduced dose of oxaliplatin on that date due to progressing neuropathy.  Oxaliplatin now dosed at 50 mg/m.  Neuropathy is unchanged, but not worse.  Currently not interfering with normal daily activities.  States that today she is feeling kind of "blah."  Feels like this might be related to the weather.  Continues to have weekly PICC line flushes with dressing changes.  Today is cycle 5 day 1 of chemotherapy.  She denies chest pain, chest pressure, or shortness of breath. She denies headaches or visual disturbances. She denies abdominal pain, nausea, vomiting, or changes in bowel or bladder habits.  .She denies fevers or chills. She denies pain. Her appetite is good. Her weight has been stable.  I have reviewed the past medical history, past surgical history, social history and family history with the patient and they are unchanged from previous note.  ALLERGIES:  is allergic to irinotecan liposome.  MEDICATIONS:  Current Outpatient Medications  Medication Sig Dispense Refill   apixaban (ELIQUIS) 5 MG TABS tablet Take 1 tablet (5 mg total) by mouth 2 (two) times daily. 60 tablet 3   cetirizine (ZYRTEC) 10 MG chewable tablet Chew 10 mg by mouth  daily.     lidocaine-prilocaine (EMLA) cream Apply to affected area once (Patient taking differently: Apply 1 Application topically See admin instructions. Apply to affected area for port access) 30 g 3   ondansetron (ZOFRAN) 8 MG tablet Take 1 tablet (8 mg total) by mouth every 8 (eight) hours as needed for nausea or vomiting. Start on the third day after irinotecan 30 tablet 1   prochlorperazine (COMPAZINE) 10 MG tablet Take 1 tablet (10 mg total) by mouth every 6 (six) hours as needed for nausea or vomiting. 30 tablet 1   No current facility-administered medications for this visit.   Facility-Administered Medications Ordered in Other Visits  Medication Dose Route Frequency Provider Last Rate Last Admin   dextrose 5 % solution  Intravenous Continuous [name], MD 10 mL/hr at 03/02/24 1138 New Bag at 03/02/24 1138   fluorouracil (ADRUCIL) 5,000 mg in sodium chloride 0.9 % 150 mL chemo infusion  2,400 mg/m2 (Treatment Plan Recorded) Intravenous 1 day or 1 dose [name], MD       heparin lock flush 100 unit/mL  500 Units Intracatheter Once PRN [name], MD       irinotecan LIPOSOME (ONIVYDE) 98.9 mg in sodium chloride 0.9 % 500 mL chemo infusion  50 mg/m2 (Treatment Plan Recorded) Intravenous Once [name], MD 349 mL/hr at 03/02/24 1259 98.9 mg at 03/02/24 1259   leucovorin 788 mg in dextrose 5 % 250 mL infusion  400 mg/m2 (Treatment Plan Recorded) Intravenous Once [name], MD       oxaliplatin (ELOXATIN) 100 mg in dextrose 5 % 500 mL chemo infusion  50 mg/m2 (Treatment Plan Recorded) Intravenous Once [name], MD       sodium chloride flush (NS) 0.9 % injection 10 mL  10 mL Intracatheter PRN [name], MD        HISTORY OF PRESENT ILLNESS:   Oncology History  Malignant neoplasm of overlapping sites of left breast in female, estrogen receptor positive (HCC)  08/04/2018 Initial Diagnosis   Screening mammogram detected left breast mass at 9:30 position 1.2 x 1.2 x 1.1 cm.  Closer to the  nipple at 9:30 position 0.8 cm lesion span 3.6 cm and a 1.5 cm apart.  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 position 2 cm from nipple, at 1 cm from nipple PASH, lymph node benign, ER 90%, PR 95%, Ki-67 20%, HER-2 negative by IHC 1+, T1CN0 stage Ia AJCC 8   09/29/2018 Surgery   Left lumpectomy: Grade 3 IDC, 1.7 cm, margins negative, lymphovascular invasion present, 0/2 lymph nodes negative, T1c N0 stage Ia   10/06/2018 Cancer Staging   Staging form: Breast, AJCC 8th Edition - Pathologic: Stage IA (pT1c, pN0(sn), cM0, G3, ER+, PR+, HER2-) - Signed by [name], MD on 10/06/2018   10/22/2018 Oncotype testing   Oncotype score 13: Distant recurrence at 9 years with hormone therapy alone 4%   11/12/2018 - 12/27/2018 Radiation Therapy   Adjuvant radiation therapy   12/27/2018 -  Anti-estrogen oral therapy   Antiestrogen therapy with letrozole 2.5 mg daily   11/30/2023 Genetic Testing   Negative genetic testing on the CancerNext-Expanded+RNAinsight panel.  VUS identified in KIT c.2083G>C and PMS2 c.-1C>A.  The report date is 11/30/2023.  The CancerNext-Expanded gene panel offered by [facility] and includes sequencing, rearrangement, and RNA analysis for the following 76 genes: AIP, ALK, APC, ATM, AXIN2, BAP1, BARD1, BMPR1A, BRCA1, BRCA2, BRIP1, CDC73, CDH1, CDK4, CDKN1B, CDKN2A, CEBPA, CHEK2, CTNNA1, DDX41, DICER1, ETV6, FH, FLCN, GATA2, LZTR1, MAX, MBD4, MEN1, MET, MLH1, MSH2, MSH3, MSH6, MUTYH, NF1, NF2, NTHL1, PALB2, PHOX2B, PMS2, POT1, PRKAR1A, PTCH1, PTEN, RAD51C, RAD51D, RB1, RET, RUNX1, SDHA, SDHAF2, SDHB, SDHC, SDHD, SMAD4, SMARCA4, SMARCB1, SMARCE1, STK11, SUFU, TMEM127, TP53, TSC1, TSC2, VHL, and WT1 (sequencing and deletion/duplication); EGFR, HOXB13, KIT, MITF, PDGFRA, POLD1, and POLE (sequencing only); EPCAM and GREM1 (deletion/duplication only).    Pancreatic cancer (HCC)  11/03/2023 Initial Diagnosis    Pancreatic cancer (HCC)   11/11/2023 -  Chemotherapy   Patient is on Treatment Plan : PANCREAS NALIRIFOX D1, 15 Q28D     11/30/2023 Genetic Testing   Negative genetic testing on the CancerNext-Expanded+RNAinsight panel.  VUS identified in KIT c.2083G>C and PMS2 c.-1C>A.  The report date is 11/30/2023.  The CancerNext-Expanded gene panel offered by [facility] and includes sequencing, rearrangement, and RNA analysis for the following 76 genes: AIP, ALK, APC, ATM, AXIN2, BAP1, BARD1, BMPR1A, BRCA1, BRCA2, BRIP1, CDC73, CDH1, CDK4, CDKN1B, CDKN2A, CEBPA, CHEK2, CTNNA1, DDX41, DICER1, ETV6, FH, FLCN, GATA2, LZTR1, MAX, MBD4, MEN1, MET, MLH1, MSH2, MSH3, MSH6, MUTYH, NF1, NF2, NTHL1, PALB2, PHOX2B, PMS2, POT1, PRKAR1A, PTCH1, PTEN, RAD51C, RAD51D, RB1, RET, RUNX1, SDHA, SDHAF2, SDHB, SDHC, SDHD, SMAD4, SMARCA4, SMARCB1, SMARCE1, STK11, SUFU, TMEM127, TP53, TSC1, TSC2, VHL, and WT1 (sequencing and deletion/duplication); EGFR, HOXB13, KIT, MITF, PDGFRA, POLD1, and POLE (sequencing only); EPCAM and GREM1 (deletion/duplication only).    01/29/2024 Imaging   CT abdomen and pelvis with contrast  IMPRESSION: 1. Diminished size of a hypodense mass of the pancreatic neck, consistent with treatment response. 2. Diminished size and hypodensity of numerous liver metastases, with new overlying pseudocirrhotic capsular retraction, consistent with treatment response. 3. 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 4. Unchanged prominent, nonspecific retroperitoneal lymph nodes.  5. Underlying hepatic steatosis.       REVIEW OF SYSTEMS:   Constitutional: Denies fevers, chills or abnormal weight loss. Fatigue  Eyes: Denies blurriness of vision Ears, nose, mouth, throat, and face: Denies mucositis or sore throat Respiratory: Denies cough, dyspnea or wheezes Cardiovascular: Denies palpitation, chest discomfort or lower extremity swelling Gastrointestinal: Has some nausea on first few days  following chemotherapy.  Manages with Compazine. Skin: Denies abnormal skin rashes Lymphatics: Denies new lymphadenopathy or easy bruising Neurological:Denies numbness, tingling or new weaknesses.  Stable neuropathy. Behavioral/Psych: Mood is stable, no new changes  All other systems were reviewed with the patient and are negative.   VITALS:   Today's Vitals   03/02/24 1120  BP: 112/60  Pulse: 85  Resp: 17  Temp: 98 F (36.7 C)  SpO2: 97%  Weight: 174 lb 3.2 oz (79 kg)  PainSc: 0-No pain   Body mass index is 32.38 kg/m.   Wt Readings from Last 3 Encounters:  03/02/24 174 lb 3.2 oz (79 kg)  02/17/24 174 lb 3.2 oz (79 kg)  02/03/24 177 lb 11.2 oz (80.6 kg)    Body mass index is 32.38 kg/m.  Performance status (ECOG): 1 - Symptomatic but completely ambulatory  PHYSICAL EXAM:   GENERAL:alert, no distress and comfortable SKIN: skin color, texture, turgor are normal, no rashes or significant lesions EYES: normal, Conjunctiva are pink and non-injected, sclera clear OROPHARYNX:no exudate, no erythema and lips, buccal mucosa, and tongue normal  NECK: supple, thyroid normal size, non-tender, without nodularity LYMPH:  no palpable lymphadenopathy in the cervical, axillary or inguinal LUNGS: clear to auscultation and percussion with normal breathing effort HEART: regular rate & rhythm and no murmurs and no lower extremity edema ABDOMEN:abdomen soft, non-tender and normal bowel sounds Musculoskeletal:no cyanosis of digits and no clubbing  NEURO: alert & oriented x 3 with fluent speech, no focal motor/sensory deficits  LABORATORY DATA:  I have reviewed the data as listed    Component Value Date/Time   NA 138 03/02/2024 0957   NA 136 06/10/2022 1152   K 3.4 (L) 03/02/2024 0957   CL 104 03/02/2024 0957   CO2 27 03/02/2024 0957   GLUCOSE 188 (H) 03/02/2024 0957   BUN 5 (L) 03/02/2024 0957   BUN 7 (L) 06/10/2022 1152   CREATININE 0.54 03/02/2024 0957   CALCIUM 9.0  03/02/2024 0957   PROT 6.9 03/02/2024 0957  PROT 7.6 06/10/2022 1152   ALBUMIN 3.8 03/02/2024 0957   ALBUMIN 4.6 06/10/2022 1152   AST 20 03/02/2024 0957   ALT 11 03/02/2024 0957   ALKPHOS 185 (H) 03/02/2024 0957   BILITOT 0.3 03/02/2024 0957   GFRNONAA >60 03/02/2024 0957    Lab Results  Component Value Date   WBC 6.9 03/02/2024   NEUTROABS 4.5 03/02/2024   HGB 12.1 03/02/2024   HCT 37.7 03/02/2024   MCV 85.5 03/02/2024   PLT 211 03/02/2024    RADIOGRAPHIC STUDIES: IR PICC PLACEMENT RIGHT >5 YRS INC IMG GUIDE Result Date: 02/17/2024 INDICATION: Previously placed left upper extremity PICC line accidentally removed. Request replacement. EXAM: ULTRASOUND AND FLUOROSCOPIC GUIDED LEFT UPPER EXTREMITY PICC LINE INSERTION MEDICATIONS: 1% plain lidocaine, 1 mL CONTRAST:  None FLUOROSCOPY TIME:  Radiation exposure index: (3  MGy) COMPLICATIONS: None immediate. TECHNIQUE: The procedure, risks, benefits, and alternatives were explained to the patient and informed written consent was obtained. A timeout was performed prior to the initiation of the procedure. The left upper extremity was prepped with chlorhexidine in a sterile fashion, and a sterile drape was applied covering the operative field. Maximum barrier sterile technique with sterile gowns and gloves were used for the procedure. A timeout was performed prior to the initiation of the procedure. Local anesthesia was provided with 1% lidocaine. Under direct ultrasound guidance, the brachial vein was accessed with a micropuncture kit after the overlying soft tissues were anesthetized with 1% lidocaine. After the overlying soft tissues were anesthetized, a small venotomy incision was created and a micropuncture kit was utilized to access the left brachial vein. Real-time ultrasound guidance was utilized for vascular access including the acquisition of a permanent ultrasound image documenting patency of the accessed vessel. A guidewire was advanced  to the level of the superior caval-atrial junction for measurement purposes and the PICC line was cut to length. A peel-away sheath was placed and a 44 cm, 5 [place], dual lumen was inserted to level of the superior caval-atrial junction. A post procedure spot fluoroscopic was obtained. The catheter easily aspirated and flushed and was secured in place. A dressing was placed. The patient tolerated the procedure well without immediate post procedural complication. FINDINGS: After catheter placement, the tip lies within the superior cavoatrial junction. The catheter aspirates and flushes normally and is ready for immediate use. IMPRESSION: Successful ultrasound and fluoroscopic guided placement of a left brachial vein approach, 44 cm, 5 French, dual lumen PICC with tip at the superior caval-atrial junction. The PICC line is ready for immediate use. Procedure performed by Brayton El PA-C and supervised by Dr. Marliss Coots Electronically Signed   By: Marliss Coots M.D.   On: 02/17/2024 16:26

## 2024-02-28 NOTE — Assessment & Plan Note (Signed)
-  stage IV with liver mets -diagnosed in 10/2023 -Patient has a history of breast cancer. She presents with dyspnea, chest discomfort, decreased appetite, and weight loss. She also reports new onset back pain. -CT showed a 2.9cm mass in pancreatic neck/body, and multiple liver mets, liver biopsy confirmed adenocarcinoma  -I reviewed the aggressive nature of pancreatic cancer, and the incurable nature of her disease due to diffuse liver metastasis. -she start first line chemo Nalirifox chemotherapy regimen on 11/12, with dose reduction for first cycle due to poor PS -Perform molecular testing on tumor tissue to identify potential targeted therapy options. -She underwent genetic testing and came back negative  -Dose reduced oxaliplatin to 50 mg/m at cycle 4 due to progressive neuropathy. -03/02/2024 -cycle 5 day 1 of first-line chemotherapy NALIRIFOX with reduced dose oxaliplatin at 50 mg/m. Continue weekly PICC line dressing changes and flush. Continue with chemotherapy every 2 weeks.

## 2024-02-29 ENCOUNTER — Encounter: Payer: Self-pay | Admitting: Hematology

## 2024-03-02 ENCOUNTER — Encounter: Payer: Self-pay | Admitting: Nurse Practitioner

## 2024-03-02 ENCOUNTER — Inpatient Hospital Stay (HOSPITAL_BASED_OUTPATIENT_CLINIC_OR_DEPARTMENT_OTHER): Payer: 59 | Admitting: Nurse Practitioner

## 2024-03-02 ENCOUNTER — Inpatient Hospital Stay: Payer: 59 | Attending: Hematology

## 2024-03-02 ENCOUNTER — Inpatient Hospital Stay: Payer: 59

## 2024-03-02 ENCOUNTER — Inpatient Hospital Stay: Admitting: Dietician

## 2024-03-02 ENCOUNTER — Encounter: Payer: Self-pay | Admitting: Hematology

## 2024-03-02 VITALS — BP 112/60 | HR 85 | Temp 98.0°F | Resp 17 | Wt 174.2 lb

## 2024-03-02 DIAGNOSIS — C787 Secondary malignant neoplasm of liver and intrahepatic bile duct: Secondary | ICD-10-CM | POA: Insufficient documentation

## 2024-03-02 DIAGNOSIS — Z5111 Encounter for antineoplastic chemotherapy: Secondary | ICD-10-CM | POA: Insufficient documentation

## 2024-03-02 DIAGNOSIS — C251 Malignant neoplasm of body of pancreas: Secondary | ICD-10-CM | POA: Insufficient documentation

## 2024-03-02 DIAGNOSIS — Z5189 Encounter for other specified aftercare: Secondary | ICD-10-CM | POA: Insufficient documentation

## 2024-03-02 DIAGNOSIS — C259 Malignant neoplasm of pancreas, unspecified: Secondary | ICD-10-CM | POA: Diagnosis not present

## 2024-03-02 DIAGNOSIS — Z170 Estrogen receptor positive status [ER+]: Secondary | ICD-10-CM

## 2024-03-02 DIAGNOSIS — C50812 Malignant neoplasm of overlapping sites of left female breast: Secondary | ICD-10-CM | POA: Diagnosis not present

## 2024-03-02 LAB — CMP (CANCER CENTER ONLY)
ALT: 11 U/L (ref 0–44)
AST: 20 U/L (ref 15–41)
Albumin: 3.8 g/dL (ref 3.5–5.0)
Alkaline Phosphatase: 185 U/L — ABNORMAL HIGH (ref 38–126)
Anion gap: 7 (ref 5–15)
BUN: 5 mg/dL — ABNORMAL LOW (ref 8–23)
CO2: 27 mmol/L (ref 22–32)
Calcium: 9 mg/dL (ref 8.9–10.3)
Chloride: 104 mmol/L (ref 98–111)
Creatinine: 0.54 mg/dL (ref 0.44–1.00)
GFR, Estimated: >60 mL/min (ref >60)
Glucose, Bld: 188 mg/dL — ABNORMAL HIGH (ref 70–99)
Potassium: 3.4 mmol/L — ABNORMAL LOW (ref 3.5–5.1)
Sodium: 138 mmol/L (ref 135–145)
Total Bilirubin: 0.3 mg/dL (ref 0.0–1.2)
Total Protein: 6.9 g/dL (ref 6.5–8.1)

## 2024-03-02 LAB — CBC WITH DIFFERENTIAL (CANCER CENTER ONLY)
Abs Immature Granulocytes: 0.1 10*3/uL — ABNORMAL HIGH (ref 0.00–0.07)
Basophils Absolute: 0 10*3/uL (ref 0.0–0.1)
Basophils Relative: 0 %
Eosinophils Absolute: 0 10*3/uL (ref 0.0–0.5)
Eosinophils Relative: 0 %
HCT: 37.7 % (ref 36.0–46.0)
Hemoglobin: 12.1 g/dL (ref 12.0–15.0)
Immature Granulocytes: 2 %
Lymphocytes Relative: 20 %
Lymphs Abs: 1.4 10*3/uL (ref 0.7–4.0)
MCH: 27.4 pg (ref 26.0–34.0)
MCHC: 32.1 g/dL (ref 30.0–36.0)
MCV: 85.5 fL (ref 80.0–100.0)
Monocytes Absolute: 0.9 10*3/uL (ref 0.1–1.0)
Monocytes Relative: 13 %
Neutro Abs: 4.5 10*3/uL (ref 1.7–7.7)
Neutrophils Relative %: 65 %
Platelet Count: 211 10*3/uL (ref 150–400)
RBC: 4.41 MIL/uL (ref 3.87–5.11)
RDW: 18.6 % — ABNORMAL HIGH (ref 11.5–15.5)
WBC Count: 6.9 10*3/uL (ref 4.0–10.5)
nRBC: 0 % (ref 0.0–0.2)

## 2024-03-02 MED ORDER — FAMOTIDINE IN NACL 20-0.9 MG/50ML-% IV SOLN
20.0000 mg | Freq: Once | INTRAVENOUS | Status: AC
  Administered 2024-03-02: 20 mg via INTRAVENOUS
  Filled 2024-03-02: qty 50

## 2024-03-02 MED ORDER — PALONOSETRON HCL INJECTION 0.25 MG/5ML
0.2500 mg | Freq: Once | INTRAVENOUS | Status: AC
  Administered 2024-03-02: 0.25 mg via INTRAVENOUS
  Filled 2024-03-02: qty 5

## 2024-03-02 MED ORDER — HEPARIN SOD (PORK) LOCK FLUSH 100 UNIT/ML IV SOLN
500.0000 [IU] | Freq: Once | INTRAVENOUS | Status: AC
  Administered 2024-03-02: 500 [IU]

## 2024-03-02 MED ORDER — DEXAMETHASONE SODIUM PHOSPHATE 10 MG/ML IJ SOLN
10.0000 mg | Freq: Once | INTRAMUSCULAR | Status: AC
Start: 2024-03-02 — End: 2024-03-02
  Administered 2024-03-02: 10 mg via INTRAVENOUS
  Filled 2024-03-02: qty 1

## 2024-03-02 MED ORDER — LEUCOVORIN CALCIUM INJECTION 350 MG
400.0000 mg/m2 | Freq: Once | INTRAMUSCULAR | Status: AC
  Administered 2024-03-02: 788 mg via INTRAVENOUS
  Filled 2024-03-02: qty 17.5

## 2024-03-02 MED ORDER — DEXTROSE 5 % IV SOLN: INTRAVENOUS | Status: DC

## 2024-03-02 MED ORDER — ATROPINE SULFATE 1 MG/ML IV SOLN
0.4000 mg | Freq: Once | INTRAVENOUS | Status: AC
  Administered 2024-03-02: 0.4 mg via INTRAVENOUS
  Filled 2024-03-02: qty 1

## 2024-03-02 MED ORDER — SODIUM CHLORIDE 0.9% FLUSH
10.0000 mL | Freq: Once | INTRAVENOUS | Status: AC
  Administered 2024-03-02: 10 mL

## 2024-03-02 MED ORDER — SODIUM CHLORIDE 0.9 % IV SOLN
2400.0000 mg/m2 | INTRAVENOUS | Status: DC
  Administered 2024-03-02: 5000 mg via INTRAVENOUS
  Filled 2024-03-02: qty 100

## 2024-03-02 MED ORDER — SODIUM CHLORIDE 0.9 % IV SOLN
50.0000 mg/m2 | Freq: Once | INTRAVENOUS | Status: AC
  Administered 2024-03-02: 98.9 mg via INTRAVENOUS
  Filled 2024-03-02: qty 23

## 2024-03-02 MED ORDER — DEXTROSE 5 % IV SOLN
50.0000 mg/m2 | Freq: Once | INTRAVENOUS | Status: AC
  Administered 2024-03-02: 100 mg via INTRAVENOUS
  Filled 2024-03-02: qty 20

## 2024-03-02 NOTE — Progress Notes (Signed)
 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 Folfirinox discontinued after first cycle due to reaction. She is currently receiving Nalirifox q28d.    Met with patient in infusion. She reports having no appetite, but is pushing nutrition and maintaining weight. Patient reports new taste for sweets. Yesterday patient had hibachi rice and veggies for lunch. Recalls cheeseburger, fries and strawberry shortcake for dinner. Pt drinking good amount of water with assistance of orange flavored Crystal Light. She has intermittent nausea. Compazine works well as needed. States she felt a little nauseas this morning. Patient drinks small glass of apple juice every 2 days for constipation.    Medications: reviewed   Labs: glucose 188, K 3.4, BUN 5  Anthropometrics: Wt 174 lb 3.2 oz today stable   2/19 - 174 lb 3.2 oz    NUTRITION DIAGNOSIS: Unintended wt loss - stable   INTERVENTION:  Encourage high protein snacks in between meals    MONITORING, EVALUATION, GOAL: wt trends, intake   NEXT VISIT: To be scheduled as needed with treatment

## 2024-03-02 NOTE — Patient Instructions (Signed)
 [facility] - A DEPT OF [facility]  Discharge Instructions: Thank you for choosing [facility] to provide your oncology and hematology care.   If you have a lab appointment with the Cancer Center, please go directly to the Cancer Center and check in at the registration area.   Wear comfortable clothing and clothing appropriate for easy access to any Portacath or PICC line.   We strive to give you quality time with your provider. You may need to reschedule your appointment if you arrive late (15 or more minutes).  Arriving late affects you and other patients whose appointments are after yours.  Also, if you miss three or more appointments without notifying the office, you may be dismissed from the clinic at the provider's discretion.      For prescription refill requests, have your pharmacy contact our office and allow 72 hours for refills to be completed.    Today you received the following chemotherapy and/or immunotherapy agents: Leucovorin, Oxaliplatin, Irinotecan      To help prevent nausea and vomiting after your treatment, we encourage you to take your nausea medication as directed.  BELOW ARE SYMPTOMS THAT SHOULD BE REPORTED IMMEDIATELY: *FEVER GREATER THAN 100.4 F (38 C) OR HIGHER *CHILLS OR SWEATING *NAUSEA AND VOMITING THAT IS NOT CONTROLLED WITH YOUR NAUSEA MEDICATION *UNUSUAL SHORTNESS OF BREATH *UNUSUAL BRUISING OR BLEEDING *URINARY PROBLEMS (pain or burning when urinating, or frequent urination) *BOWEL PROBLEMS (unusual diarrhea, constipation, pain near the anus) TENDERNESS IN MOUTH AND THROAT WITH OR WITHOUT PRESENCE OF ULCERS (sore throat, sores in mouth, or a toothache) UNUSUAL RASH, SWELLING OR PAIN  UNUSUAL VAGINAL DISCHARGE OR ITCHING   Items with * indicate a potential emergency and should be followed up as soon as possible or go to the Emergency Department if any problems should occur.  Please show the CHEMOTHERAPY  ALERT CARD or IMMUNOTHERAPY ALERT CARD at check-in to the Emergency Department and triage nurse.  Should you have questions after your visit or need to cancel or reschedule your appointment, please contact [facility] - A DEPT [facility]  Dept: [phone]  and follow the prompts.  Office hours are 8:00 a.m. to 4:30 p.m. Monday - Friday. Please note that voicemails left after 4:00 p.m. may not be returned until the following business day.  We are closed weekends and major holidays. You have access to a nurse at all times for urgent questions. Please call the main number to the clinic Dept: [phone] and follow the prompts.   For any non-urgent questions, you may also contact your provider using MyChart. We now offer e-Visits for anyone 35 and older to request care online for non-urgent symptoms. For details visit [email or web address].   Also download the MyChart app! Go to the app store, search "MyChart", open the app, select [facility], and log in with your MyChart username and password.

## 2024-03-03 ENCOUNTER — Encounter: Payer: Self-pay | Admitting: Nurse Practitioner

## 2024-03-03 LAB — CANCER ANTIGEN 19-9: CA 19-9: 1839 U/mL — ABNORMAL HIGH (ref 0–35)

## 2024-03-04 ENCOUNTER — Inpatient Hospital Stay: Payer: 59

## 2024-03-04 VITALS — BP 114/86 | HR 81 | Resp 16

## 2024-03-04 DIAGNOSIS — C251 Malignant neoplasm of body of pancreas: Secondary | ICD-10-CM

## 2024-03-04 DIAGNOSIS — C50812 Malignant neoplasm of overlapping sites of left female breast: Secondary | ICD-10-CM

## 2024-03-04 DIAGNOSIS — Z5111 Encounter for antineoplastic chemotherapy: Secondary | ICD-10-CM | POA: Diagnosis not present

## 2024-03-04 MED ORDER — PEGFILGRASTIM-CBQV 6 MG/0.6ML ~~LOC~~ SOSY
6.0000 mg | PREFILLED_SYRINGE | Freq: Once | SUBCUTANEOUS | Status: AC
  Administered 2024-03-04: 6 mg via SUBCUTANEOUS
  Filled 2024-03-04: qty 0.6

## 2024-03-04 MED ORDER — SODIUM CHLORIDE 0.9% FLUSH
10.0000 mL | Freq: Once | INTRAVENOUS | Status: AC
  Administered 2024-03-04: 10 mL

## 2024-03-04 MED ORDER — HEPARIN SOD (PORK) LOCK FLUSH 100 UNIT/ML IV SOLN
500.0000 [IU] | Freq: Once | INTRAVENOUS | Status: AC
  Administered 2024-03-04: 500 [IU]

## 2024-03-09 ENCOUNTER — Inpatient Hospital Stay

## 2024-03-09 DIAGNOSIS — C251 Malignant neoplasm of body of pancreas: Secondary | ICD-10-CM

## 2024-03-09 DIAGNOSIS — Z5111 Encounter for antineoplastic chemotherapy: Secondary | ICD-10-CM | POA: Diagnosis not present

## 2024-03-09 MED ORDER — SODIUM CHLORIDE 0.9% FLUSH
10.0000 mL | INTRAVENOUS | Status: DC | PRN
Start: 2024-03-09 — End: 2024-03-09
  Administered 2024-03-09: 10 mL

## 2024-03-09 MED ORDER — HEPARIN SOD (PORK) LOCK FLUSH 100 UNIT/ML IV SOLN
500.0000 [IU] | Freq: Once | INTRAVENOUS | Status: AC | PRN
  Administered 2024-03-09: 500 [IU]

## 2024-03-10 DIAGNOSIS — C259 Malignant neoplasm of pancreas, unspecified: Secondary | ICD-10-CM | POA: Diagnosis not present

## 2024-03-10 LAB — CANCER ANTIGEN 19-9: CA 19-9: 1506 U/mL — ABNORMAL HIGH (ref 0–35)

## 2024-03-15 NOTE — Assessment & Plan Note (Signed)
-  stage IV with liver mets -diagnosed in 10/2023 -Patient has a history of breast cancer. She presents with dyspnea, chest discomfort, decreased appetite, and weight loss. She also reports new onset back pain. -CT showed a 2.9cm mass in pancreatic neck/body, and multiple liver mets, liver biopsy confirmed adenocarcinoma  -I reviewed the aggressive nature of pancreatic cancer, and the incurable nature of her disease due to diffuse liver metastasis. -she start first line chemo Nalirifox chemotherapy regimen on 11/12, with dose reduction for first cycle due to poor PS -Perform molecular testing on tumor tissue to identify potential targeted therapy options. -She underwent genetic testing and came back negative

## 2024-03-16 ENCOUNTER — Inpatient Hospital Stay

## 2024-03-16 ENCOUNTER — Encounter: Payer: Self-pay | Admitting: Hematology

## 2024-03-16 ENCOUNTER — Inpatient Hospital Stay (HOSPITAL_BASED_OUTPATIENT_CLINIC_OR_DEPARTMENT_OTHER): Admitting: Hematology

## 2024-03-16 ENCOUNTER — Other Ambulatory Visit: Payer: Self-pay

## 2024-03-16 VITALS — BP 110/70 | HR 77 | Temp 98.0°F | Resp 17 | Ht 61.5 in | Wt 171.9 lb

## 2024-03-16 DIAGNOSIS — C251 Malignant neoplasm of body of pancreas: Secondary | ICD-10-CM

## 2024-03-16 DIAGNOSIS — C259 Malignant neoplasm of pancreas, unspecified: Secondary | ICD-10-CM | POA: Diagnosis not present

## 2024-03-16 DIAGNOSIS — Z5111 Encounter for antineoplastic chemotherapy: Secondary | ICD-10-CM | POA: Diagnosis not present

## 2024-03-16 DIAGNOSIS — Z170 Estrogen receptor positive status [ER+]: Secondary | ICD-10-CM

## 2024-03-16 LAB — CMP (CANCER CENTER ONLY)
ALT: 12 U/L (ref 0–44)
AST: 22 U/L (ref 15–41)
Albumin: 3.8 g/dL (ref 3.5–5.0)
Alkaline Phosphatase: 184 U/L — ABNORMAL HIGH (ref 38–126)
Anion gap: 5 (ref 5–15)
BUN: <5 mg/dL — ABNORMAL LOW (ref 8–23)
CO2: 30 mmol/L (ref 22–32)
Calcium: 9.1 mg/dL (ref 8.9–10.3)
Chloride: 104 mmol/L (ref 98–111)
Creatinine: 0.54 mg/dL (ref 0.44–1.00)
GFR, Estimated: >60 mL/min (ref >60)
Glucose, Bld: 125 mg/dL — ABNORMAL HIGH (ref 70–99)
Potassium: 3.4 mmol/L — ABNORMAL LOW (ref 3.5–5.1)
Sodium: 139 mmol/L (ref 135–145)
Total Bilirubin: 0.3 mg/dL (ref 0.0–1.2)
Total Protein: 6.7 g/dL (ref 6.5–8.1)

## 2024-03-16 LAB — CBC WITH DIFFERENTIAL (CANCER CENTER ONLY)
Abs Immature Granulocytes: 0.11 10*3/uL — ABNORMAL HIGH (ref 0.00–0.07)
Basophils Absolute: 0 10*3/uL (ref 0.0–0.1)
Basophils Relative: 1 %
Eosinophils Absolute: 0.1 10*3/uL (ref 0.0–0.5)
Eosinophils Relative: 1 %
HCT: 37.3 % (ref 36.0–46.0)
Hemoglobin: 11.9 g/dL — ABNORMAL LOW (ref 12.0–15.0)
Immature Granulocytes: 2 %
Lymphocytes Relative: 19 %
Lymphs Abs: 1.2 10*3/uL (ref 0.7–4.0)
MCH: 27.4 pg (ref 26.0–34.0)
MCHC: 31.9 g/dL (ref 30.0–36.0)
MCV: 85.9 fL (ref 80.0–100.0)
Monocytes Absolute: 0.9 10*3/uL (ref 0.1–1.0)
Monocytes Relative: 14 %
Neutro Abs: 4.3 10*3/uL (ref 1.7–7.7)
Neutrophils Relative %: 63 %
Platelet Count: 169 10*3/uL (ref 150–400)
RBC: 4.34 MIL/uL (ref 3.87–5.11)
RDW: 17.3 % — ABNORMAL HIGH (ref 11.5–15.5)
WBC Count: 6.6 10*3/uL (ref 4.0–10.5)
nRBC: 0 % (ref 0.0–0.2)

## 2024-03-16 MED ORDER — SODIUM CHLORIDE 0.9 % IV SOLN
2400.0000 mg/m2 | INTRAVENOUS | Status: DC
  Administered 2024-03-16: 5000 mg via INTRAVENOUS
  Filled 2024-03-16: qty 100

## 2024-03-16 MED ORDER — HEPARIN SOD (PORK) LOCK FLUSH 100 UNIT/ML IV SOLN
500.0000 [IU] | Freq: Once | INTRAVENOUS | Status: AC
  Administered 2024-03-16: 500 [IU]

## 2024-03-16 MED ORDER — SODIUM CHLORIDE 0.9 % IV SOLN
50.0000 mg/m2 | Freq: Once | INTRAVENOUS | Status: AC
  Administered 2024-03-16: 98.9 mg via INTRAVENOUS
  Filled 2024-03-16: qty 23

## 2024-03-16 MED ORDER — SODIUM CHLORIDE 0.9% FLUSH
10.0000 mL | Freq: Once | INTRAVENOUS | Status: AC
  Administered 2024-03-16: 10 mL

## 2024-03-16 MED ORDER — ATROPINE SULFATE 1 MG/ML IV SOLN
0.4000 mg | Freq: Once | INTRAVENOUS | Status: AC
  Filled 2024-03-16: qty 1

## 2024-03-16 MED ORDER — SODIUM CHLORIDE 0.9% FLUSH: 3.0000 mL | INTRAVENOUS | Status: DC | PRN

## 2024-03-16 MED ORDER — HEPARIN SOD (PORK) LOCK FLUSH 100 UNIT/ML IV SOLN: 250.0000 [IU] | Freq: Once | INTRAVENOUS | Status: DC | PRN

## 2024-03-16 MED ORDER — OXALIPLATIN CHEMO INJECTION 100 MG/20ML
40.0000 mg/m2 | Freq: Once | INTRAVENOUS | Status: AC
  Administered 2024-03-16: 80 mg via INTRAVENOUS
  Filled 2024-03-16: qty 16

## 2024-03-16 MED ORDER — FAMOTIDINE IN NACL 20-0.9 MG/50ML-% IV SOLN
20.0000 mg | Freq: Once | INTRAVENOUS | Status: AC
  Administered 2024-03-16: 20 mg via INTRAVENOUS
  Filled 2024-03-16: qty 50

## 2024-03-16 MED ORDER — DEXTROSE 5 % IV SOLN: INTRAVENOUS | Status: DC

## 2024-03-16 MED ORDER — ATROPINE SULFATE 1 MG/ML IV SOLN
0.5000 mg | Freq: Once | INTRAVENOUS | Status: AC | PRN
  Administered 2024-03-16: 0.5 mg via INTRAVENOUS

## 2024-03-16 MED ORDER — DEXAMETHASONE SODIUM PHOSPHATE 10 MG/ML IJ SOLN
10.0000 mg | Freq: Once | INTRAMUSCULAR | Status: AC
  Administered 2024-03-16: 10 mg via INTRAVENOUS
  Filled 2024-03-16: qty 1

## 2024-03-16 MED ORDER — PALONOSETRON HCL INJECTION 0.25 MG/5ML
0.2500 mg | Freq: Once | INTRAVENOUS | Status: AC
  Administered 2024-03-16: 0.25 mg via INTRAVENOUS
  Filled 2024-03-16: qty 5

## 2024-03-16 MED ORDER — DEXTROSE 5 % IV SOLN
400.0000 mg/m2 | Freq: Once | INTRAVENOUS | Status: AC
  Administered 2024-03-16: 788 mg via INTRAVENOUS
  Filled 2024-03-16: qty 17.5

## 2024-03-16 NOTE — Progress Notes (Signed)
 Per Dr. Mosetta Putt, ok to proceed with 5-FU dose of 5g with pt BSA=1.84, oxali dose-reduced to 40mg /m2 d/t worsening neuropathy, and oxali removed from subsequent cycles.  Drusilla Kanner, PharmD, MBA

## 2024-03-16 NOTE — Patient Instructions (Signed)
 [facility] - A DEPT OF [facility]  Discharge Instructions: Thank you for choosing [facility] to provide your oncology and hematology care.   If you have a lab appointment with the Cancer Center, please go directly to the Cancer Center and check in at the registration area.   Wear comfortable clothing and clothing appropriate for easy access to any Portacath or PICC line.   We strive to give you quality time with your provider. You may need to reschedule your appointment if you arrive late (15 or more minutes).  Arriving late affects you and other patients whose appointments are after yours.  Also, if you miss three or more appointments without notifying the office, you may be dismissed from the clinic at the provider's discretion.      For prescription refill requests, have your pharmacy contact our office and allow 72 hours for refills to be completed.    Today you received the following chemotherapy and/or immunotherapy agents: Irinotecan liposome, Oxaliplatin, Leucovorin, 5FU      To help prevent nausea and vomiting after your treatment, we encourage you to take your nausea medication as directed.  BELOW ARE SYMPTOMS THAT SHOULD BE REPORTED IMMEDIATELY: *FEVER GREATER THAN 100.4 F (38 C) OR HIGHER *CHILLS OR SWEATING *NAUSEA AND VOMITING THAT IS NOT CONTROLLED WITH YOUR NAUSEA MEDICATION *UNUSUAL SHORTNESS OF BREATH *UNUSUAL BRUISING OR BLEEDING *URINARY PROBLEMS (pain or burning when urinating, or frequent urination) *BOWEL PROBLEMS (unusual diarrhea, constipation, pain near the anus) TENDERNESS IN MOUTH AND THROAT WITH OR WITHOUT PRESENCE OF ULCERS (sore throat, sores in mouth, or a toothache) UNUSUAL RASH, SWELLING OR PAIN  UNUSUAL VAGINAL DISCHARGE OR ITCHING   Items with * indicate a potential emergency and should be followed up as soon as possible or go to the Emergency Department if any problems should occur.  Please show the  CHEMOTHERAPY ALERT CARD or IMMUNOTHERAPY ALERT CARD at check-in to the Emergency Department and triage nurse.  Should you have questions after your visit or need to cancel or reschedule your appointment, please contact [facility] - A DEPT [facility]  Dept: [phone]  and follow the prompts.  Office hours are 8:00 a.m. to 4:30 p.m. Monday - Friday. Please note that voicemails left after 4:00 p.m. may not be returned until the following business day.  We are closed weekends and major holidays. You have access to a nurse at all times for urgent questions. Please call the main number to the clinic Dept: [phone] and follow the prompts.   For any non-urgent questions, you may also contact your provider using MyChart. We now offer e-Visits for anyone 51 and older to request care online for non-urgent symptoms. For details visit [email or web address].   Also download the MyChart app! Go to the app store, search "MyChart", open the app, select [place], and log in with your MyChart username and password.

## 2024-03-16 NOTE — Progress Notes (Signed)
 [facility]   Telephone:(336) [phone] Fax:(336) [phone]   Clinic Follow up Note   Patient Care Team: [name], PA as PCP - General (Nurse Practitioner) [name], MD as Consulting Physician (Hematology and Oncology) [name], MD as Consulting Physician (Radiation Oncology) [name], MD as Consulting Physician (General Surgery) [name], MD as Consulting Physician (Hematology and Oncology)  Date of Service:  03/16/2024  CHIEF COMPLAINT: f/u of pancreatic cancer  CURRENT THERAPY:  NALIRIFOX every 2 weeks  Oncology History   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 She presents with dyspnea, chest discomfort, decreased appetite, and weight loss. She also reports new onset back pain. -CT showed a 2.9cm mass in pancreatic neck/body, and multiple liver mets, liver biopsy confirmed adenocarcinoma  -I reviewed the aggressive nature of pancreatic cancer, and the incurable nature of her disease due to diffuse liver metastasis. -she start first line chemo Nalirifox chemotherapy regimen on 11/12, with dose reduction for first cycle due to poor PS -Perform molecular testing on tumor tissue to identify potential targeted therapy options. -She underwent genetic testing and came back negative    Assessment and Plan    Pancreatic cancer Currently on cycle six of chemotherapy for pancreatic cancer, having started treatment in November and completed nine cycles. Last scan in February showed a positive response with tumor markers decreasing from 24,000 to 1,500. Considering discontinuation of oxaliplatin due to neuropathy, while continuing liposomal irinotecan and 5-FU pump, which have less neuropathic effects. - Continue chemotherapy regimen with adjustments - Repeat CT scan in early May - Ensure lab work is drawn with each chemotherapy session  Chemotherapy-induced peripheral  neuropathy Experiencing worsening neuropathy, with numbness and tingling in toes, rated 6-7/10 in severity, likely due to oxaliplatin. Significant enough to warrant medication adjustment. Plan to discontinue oxaliplatin next cycle and reduce dose today. - Reduce oxaliplatin dose today - Discontinue oxaliplatin in the next cycle - Consider starting gabapentin for neuropathy management  PICC line management PICC line in place for chemotherapy administration with no reported issues. Dressing changes are performed weekly as required. - Continue weekly PICC line dressing changes - Ensure PICC line care during next week's visit  Insomnia Experiencing chronic insomnia predating cancer treatment, compensating with daytime naps. - Consider trial of melatonin for sleep improvement  Plan -Lab reviewed, adequate for treatment, will proceed to cycle 6 chemo today, with further dose reduction on oxaliplatin due to neuropathy, and plan to remove oxaliplatin from treatment from next cycle and increase liposomal irinotecan dose to 70 mg/m from next cycle.  - Schedule next chemotherapy session for April 2 - Follow up in two weeks  -Will order NexGen or sequencing Tempus on her previous liver biopsy.        SUMMARY OF ONCOLOGIC HISTORY: Oncology History  Malignant neoplasm of overlapping sites of left breast in female, estrogen receptor positive (HCC)  08/04/2018 Initial Diagnosis   Screening mammogram detected left breast mass at 9:30 position 1.2 x 1.2 x 1.1 cm.  Closer to the nipple at 9:30 position 0.8 cm lesion span 3.6 cm and a 1.5 cm apart.  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 position 2 cm from nipple, at 1 cm from nipple PASH, lymph node benign, ER 90%, PR 95%, Ki-67 20%, HER-2 negative by IHC 1+, T1CN0 stage Ia AJCC 8  09/29/2018 Surgery   Left lumpectomy: Grade 3 IDC, 1.7 cm, margins negative, lymphovascular  invasion present, 0/2 lymph nodes negative, T1c N0 stage Ia   10/06/2018 Cancer Staging   Staging form: Breast, AJCC 8th Edition - Pathologic: Stage IA (pT1c, pN0(sn), cM0, G3, ER+, PR+, HER2-) - Signed by [name], MD on 10/06/2018   10/22/2018 Oncotype testing   Oncotype score 13: Distant recurrence at 9 years with hormone therapy alone 4%   11/12/2018 - 12/27/2018 Radiation Therapy   Adjuvant radiation therapy   12/27/2018 -  Anti-estrogen oral therapy   Antiestrogen therapy with letrozole 2.5 mg daily   11/30/2023 Genetic Testing   Negative genetic testing on the CancerNext-Expanded+RNAinsight panel.  VUS identified in KIT c.2083G>C and PMS2 c.-1C>A.  The report date is 11/30/2023.  The CancerNext-Expanded gene panel offered by [facility] and includes sequencing, rearrangement, and RNA analysis for the following 76 genes: AIP, ALK, APC, ATM, AXIN2, BAP1, BARD1, BMPR1A, BRCA1, BRCA2, BRIP1, CDC73, CDH1, CDK4, CDKN1B, CDKN2A, CEBPA, CHEK2, CTNNA1, DDX41, DICER1, ETV6, FH, FLCN, GATA2, LZTR1, MAX, MBD4, MEN1, MET, MLH1, MSH2, MSH3, MSH6, MUTYH, NF1, NF2, NTHL1, PALB2, PHOX2B, PMS2, POT1, PRKAR1A, PTCH1, PTEN, RAD51C, RAD51D, RB1, RET, RUNX1, SDHA, SDHAF2, SDHB, SDHC, SDHD, SMAD4, SMARCA4, SMARCB1, SMARCE1, STK11, SUFU, TMEM127, TP53, TSC1, TSC2, VHL, and WT1 (sequencing and deletion/duplication); EGFR, HOXB13, KIT, MITF, PDGFRA, POLD1, and POLE (sequencing only); EPCAM and GREM1 (deletion/duplication only).    Pancreatic cancer (HCC)  11/03/2023 Initial Diagnosis   Pancreatic cancer (HCC)   11/11/2023 -  Chemotherapy   Patient is on Treatment Plan : PANCREAS NALIRIFOX D1, 15 Q28D     11/30/2023 Genetic Testing   Negative genetic testing on the CancerNext-Expanded+RNAinsight panel.  VUS identified in KIT c.2083G>C and PMS2 c.-1C>A.  The report date is 11/30/2023.  The CancerNext-Expanded gene panel offered by [facility] and includes sequencing, rearrangement, and RNA analysis for  the following 76 genes: AIP, ALK, APC, ATM, AXIN2, BAP1, BARD1, BMPR1A, BRCA1, BRCA2, BRIP1, CDC73, CDH1, CDK4, CDKN1B, CDKN2A, CEBPA, CHEK2, CTNNA1, DDX41, DICER1, ETV6, FH, FLCN, GATA2, LZTR1, MAX, MBD4, MEN1, MET, MLH1, MSH2, MSH3, MSH6, MUTYH, NF1, NF2, NTHL1, PALB2, PHOX2B, PMS2, POT1, PRKAR1A, PTCH1, PTEN, RAD51C, RAD51D, RB1, RET, RUNX1, SDHA, SDHAF2, SDHB, SDHC, SDHD, SMAD4, SMARCA4, SMARCB1, SMARCE1, STK11, SUFU, TMEM127, TP53, TSC1, TSC2, VHL, and WT1 (sequencing and deletion/duplication); EGFR, HOXB13, KIT, MITF, PDGFRA, POLD1, and POLE (sequencing only); EPCAM and GREM1 (deletion/duplication only).    01/29/2024 Imaging   CT abdomen and pelvis with contrast  IMPRESSION: 1. Diminished size of a hypodense mass of the pancreatic neck, consistent with treatment response. 2. Diminished size and hypodensity of numerous liver metastases, with new overlying pseudocirrhotic capsular retraction, consistent with treatment response. 3. 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 4. Unchanged prominent, nonspecific retroperitoneal lymph nodes.  5. Underlying hepatic steatosis.      Discussed the use of AI scribe software for clinical note transcription with the patient, who gave verbal consent to proceed.  History of Present Illness   The patient, a 63 year old female with a history of pancreatic cancer, presents for a follow-up visit. She is currently undergoing chemotherapy and reports worsening neuropathy, characterized by both numbness and tingling in her toes. She rates the severity of the tingling as a 6 or 7 out of 10. The patient also reports difficulty sleeping, which she attributes to her pre-existing sleep issues rather than the neuropathy. She manages her lack of sleep by taking naps during the day. She has not  tried melatonin for her sleep issues. The patient also reports feeling tired after chemotherapy but does not report any other side effects. She has a PICC  line, which she reports is functioning well and is being cared for properly.         All other systems were reviewed with the patient and are negative.  MEDICAL HISTORY:  Past Medical History:  Diagnosis Date   Acute pulmonary embolism (HCC) 10/29/2023   Allergy    Back pain    Cancer (HCC) 2019   left breast cancer-last radiation Dec.2019   Diabetes mellitus without complication [facility])    Medical history non-contributory    Personal history of radiation therapy    Port-A-Cath in place - removed 12-11-2023 due to port-a-cath infection. 11/13/2023   Prediabetes    Tiredness    Vitamin D deficiency     SURGICAL HISTORY: Past Surgical History:  Procedure Laterality Date   BREAST BIOPSY Left 08/04/2018   x3   BREAST LUMPECTOMY Left    09-29-18   BREAST LUMPECTOMY WITH RADIOACTIVE SEED AND SENTINEL LYMPH NODE BIOPSY Left 09/29/2018   Procedure: LEFT BREAST LUMPECTOMY WITH RADIOACTIVE SEED AND SENTINEL LYMPH NODE BIOPSY;  Surgeon: [name], MD;  Location: [facility];  Service: General;  Laterality: Left;   COLONOSCOPY  2016   IR IMAGING GUIDED PORT INSERTION  11/03/2023   IR PATIENT EVAL TECH 0-60 MINS  12/18/2023   IR PATIENT EVAL TECH 0-60 MINS  12/24/2023   IR PATIENT EVAL TECH 0-60 MINS  12/28/2023   IR PATIENT EVAL TECH 0-60 MINS  01/04/2024   IR PATIENT EVAL TECH 0-60 MINS  01/13/2024   IR PATIENT EVAL TECH 0-60 MINS  01/22/2024   IR RADIOLOGIST EVAL & MGMT  12/15/2023   IR REMOVAL TUN ACCESS W/ PORT W/O FL MOD SED  12/11/2023   POLYPECTOMY     WISDOM TOOTH EXTRACTION      I have reviewed the social history and family history with the patient and they are unchanged from previous note.  ALLERGIES:  is allergic to irinotecan liposome.  MEDICATIONS:  Current Outpatient Medications  Medication Sig Dispense Refill   apixaban (ELIQUIS) 5 MG TABS tablet Take 1 tablet (5 mg total) by mouth 2 (two) times daily. 60 tablet 3   cetirizine (ZYRTEC) 10 MG  chewable tablet Chew 10 mg by mouth daily.     lidocaine-prilocaine (EMLA) cream Apply to affected area once (Patient taking differently: Apply 1 Application topically See admin instructions. Apply to affected area for port access) 30 g 3   ondansetron (ZOFRAN) 8 MG tablet Take 1 tablet (8 mg total) by mouth every 8 (eight) hours as needed for nausea or vomiting. Start on the third day after irinotecan 30 tablet 1   prochlorperazine (COMPAZINE) 10 MG tablet Take 1 tablet (10 mg total) by mouth every 6 (six) hours as needed for nausea or vomiting. 30 tablet 1   No current facility-administered medications for this visit.   Facility-Administered Medications Ordered in Other Visits  Medication Dose Route Frequency Provider Last Rate Last Admin   dextrose 5 % solution   Intravenous Continuous [name], MD   Stopped at 03/16/24 1610   fluorouracil (ADRUCIL) 5,000 mg in sodium chloride 0.9 % 150 mL chemo infusion  2,400 mg/m2 (Treatment Plan Recorded) Intravenous 1 day or 1 dose [name], MD   Infusion Verify at 03/16/24 1623   heparin lock flush 100 unit/mL  500 Units Intracatheter Once PRN  [name], MD       heparin lock flush 100 unit/mL  250 Units Intracatheter Once PRN [name], MD       sodium chloride flush (NS) 0.9 % injection 10 mL  10 mL Intracatheter PRN [name], MD       sodium chloride flush (NS) 0.9 % injection 3 mL  3 mL Intracatheter PRN [name], MD        PHYSICAL EXAMINATION: ECOG PERFORMANCE STATUS: 1 - Symptomatic but completely ambulatory  Vitals:   03/16/24 0953  BP: 110/70  Pulse: 77  Resp: 17  Temp: 98 F (36.7 C)  SpO2: 96%   Wt Readings from Last 3 Encounters:  03/16/24 171 lb 14.4 oz (78 kg)  03/02/24 174 lb 3.2 oz (79 kg)  02/17/24 174 lb 3.2 oz (79 kg)     GENERAL:alert, no distress and comfortable SKIN: skin color, texture, turgor are normal, no rashes or significant lesions EYES: normal, Conjunctiva are pink and non-injected, sclera clear NECK:  supple, thyroid normal size, non-tender, without nodularity LYMPH:  no palpable lymphadenopathy in the cervical, axillary  LUNGS: clear to auscultation and percussion with normal breathing effort HEART: regular rate & rhythm and no murmurs and no lower extremity edema ABDOMEN:abdomen soft, non-tender and normal bowel sounds Musculoskeletal:no cyanosis of digits and no clubbing  NEURO: alert & oriented x 3 with fluent speech, no focal motor/sensory deficits   LABORATORY DATA:  I have reviewed the data as listed    Latest Ref Rng & Units 03/16/2024   10:33 AM 03/02/2024    9:57 AM 02/17/2024    9:28 AM  CBC  WBC 4.0 - 10.5 K/uL 6.6  6.9  4.2   Hemoglobin 12.0 - 15.0 g/dL 64.4  03.4  74.2   Hematocrit 36.0 - 46.0 % 37.3  37.7  36.7   Platelets 150 - 400 K/uL 169  211  177         Latest Ref Rng & Units 03/16/2024   10:33 AM 03/02/2024    9:57 AM 02/17/2024    9:28 AM  CMP  Glucose 70 - 99 mg/dL 595  638  756   BUN 8 - 23 mg/dL 5  5  6    Creatinine 0.44 - 1.00 mg/dL 4.33  2.95  1.88   Sodium 135 - 145 mmol/L 139  138  139   Potassium 3.5 - 5.1 mmol/L 3.4  3.4  3.2   Chloride 98 - 111 mmol/L 104  104  105   CO2 22 - 32 mmol/L 30  27  26    Calcium 8.9 - 10.3 mg/dL 9.1  9.0  9.3   Total Protein 6.5 - 8.1 g/dL 6.7  6.9  6.7   Total Bilirubin 0.0 - 1.2 mg/dL 0.3  0.3  0.3   Alkaline Phos 38 - 126 U/L 184  185  191   AST 15 - 41 U/L 22  20  22    ALT 0 - 44 U/L 12  11  14        RADIOGRAPHIC STUDIES: I have personally reviewed the radiological images as listed and agreed with the findings in the report. No results found.    Orders Placed This Encounter  Procedures   CBC with Differential (Cancer Center Only)    Standing Status:   Future    Expected Date:   03/16/2024    Expiration Date:   03/16/2025   CMP (Cancer Center only)    Standing Status:  Future    Expected Date:   03/16/2024    Expiration Date:   03/16/2025   CBC with Differential (Cancer Center Only)    Standing  Status:   Future    Number of Occurrences:   1    Expected Date:   03/16/2024    Expiration Date:   03/16/2025   CMP (Cancer Center only)    Standing Status:   Future    Number of Occurrences:   1    Expected Date:   03/16/2024    Expiration Date:   03/16/2025   All questions were answered. The patient knows to call the clinic with any problems, questions or concerns. No barriers to learning was detected. The total time spent in the appointment was 40 minutes.     [name], MD 03/16/2024

## 2024-03-17 ENCOUNTER — Encounter: Payer: Self-pay | Admitting: Hematology

## 2024-03-17 ENCOUNTER — Other Ambulatory Visit (HOSPITAL_COMMUNITY): Payer: Self-pay

## 2024-03-17 ENCOUNTER — Other Ambulatory Visit: Payer: Self-pay

## 2024-03-17 NOTE — Progress Notes (Signed)
 Udenyca changed to Neulasta due to insurance preference per PA team.  Drusilla Kanner, PharmD, MBA

## 2024-03-17 NOTE — Progress Notes (Signed)
 Confirmed with PA team that pt's insurance prefers Neulasta. Tx plan updated.

## 2024-03-18 ENCOUNTER — Other Ambulatory Visit: Payer: Self-pay

## 2024-03-18 ENCOUNTER — Inpatient Hospital Stay

## 2024-03-18 VITALS — BP 109/73 | HR 78 | Temp 98.8°F | Resp 17

## 2024-03-18 DIAGNOSIS — C251 Malignant neoplasm of body of pancreas: Secondary | ICD-10-CM

## 2024-03-18 DIAGNOSIS — Z5111 Encounter for antineoplastic chemotherapy: Secondary | ICD-10-CM | POA: Diagnosis not present

## 2024-03-18 DIAGNOSIS — Z170 Estrogen receptor positive status [ER+]: Secondary | ICD-10-CM

## 2024-03-18 MED ORDER — SODIUM CHLORIDE 0.9% FLUSH: 10.0000 mL | INTRAVENOUS | Status: DC | PRN

## 2024-03-18 MED ORDER — HEPARIN SOD (PORK) LOCK FLUSH 100 UNIT/ML IV SOLN: 500.0000 [IU] | Freq: Once | INTRAVENOUS | Status: DC | PRN

## 2024-03-18 MED ORDER — HEPARIN SOD (PORK) LOCK FLUSH 100 UNIT/ML IV SOLN
500.0000 [IU] | Freq: Once | INTRAVENOUS | Status: AC
  Administered 2024-03-18: 500 [IU]

## 2024-03-18 MED ORDER — PEGFILGRASTIM INJECTION 6 MG/0.6ML ~~LOC~~
6.0000 mg | PREFILLED_SYRINGE | Freq: Once | SUBCUTANEOUS | Status: AC
  Administered 2024-03-18: 6 mg via SUBCUTANEOUS
  Filled 2024-03-18: qty 0.6

## 2024-03-18 MED ORDER — SODIUM CHLORIDE 0.9% FLUSH
10.0000 mL | Freq: Once | INTRAVENOUS | Status: AC
Start: 2024-03-18 — End: 2024-03-18
  Administered 2024-03-18: 10 mL

## 2024-03-21 ENCOUNTER — Other Ambulatory Visit: Payer: Self-pay

## 2024-03-23 ENCOUNTER — Inpatient Hospital Stay

## 2024-03-23 DIAGNOSIS — Z170 Estrogen receptor positive status [ER+]: Secondary | ICD-10-CM

## 2024-03-23 DIAGNOSIS — Z5111 Encounter for antineoplastic chemotherapy: Secondary | ICD-10-CM | POA: Diagnosis not present

## 2024-03-23 DIAGNOSIS — C251 Malignant neoplasm of body of pancreas: Secondary | ICD-10-CM

## 2024-03-23 LAB — CMP (CANCER CENTER ONLY)
ALT: 15 U/L (ref 0–44)
AST: 20 U/L (ref 15–41)
Albumin: 4 g/dL (ref 3.5–5.0)
Alkaline Phosphatase: 220 U/L — ABNORMAL HIGH (ref 38–126)
Anion gap: 7 (ref 5–15)
BUN: 6 mg/dL — ABNORMAL LOW (ref 8–23)
CO2: 27 mmol/L (ref 22–32)
Calcium: 9.1 mg/dL (ref 8.9–10.3)
Chloride: 103 mmol/L (ref 98–111)
Creatinine: 0.47 mg/dL (ref 0.44–1.00)
GFR, Estimated: >60 mL/min (ref >60)
Glucose, Bld: 141 mg/dL — ABNORMAL HIGH (ref 70–99)
Potassium: 3.4 mmol/L — ABNORMAL LOW (ref 3.5–5.1)
Sodium: 137 mmol/L (ref 135–145)
Total Bilirubin: 0.4 mg/dL (ref 0.0–1.2)
Total Protein: 7 g/dL (ref 6.5–8.1)

## 2024-03-23 LAB — CBC WITH DIFFERENTIAL (CANCER CENTER ONLY)
Abs Immature Granulocytes: 0.08 10*3/uL — ABNORMAL HIGH (ref 0.00–0.07)
Basophils Absolute: 0 10*3/uL (ref 0.0–0.1)
Basophils Relative: 1 %
Eosinophils Absolute: 0 10*3/uL (ref 0.0–0.5)
Eosinophils Relative: 0 %
HCT: 36.5 % (ref 36.0–46.0)
Hemoglobin: 11.8 g/dL — ABNORMAL LOW (ref 12.0–15.0)
Immature Granulocytes: 1 %
Lymphocytes Relative: 15 %
Lymphs Abs: 1.1 10*3/uL (ref 0.7–4.0)
MCH: 27.4 pg (ref 26.0–34.0)
MCHC: 32.3 g/dL (ref 30.0–36.0)
MCV: 84.9 fL (ref 80.0–100.0)
Monocytes Absolute: 0.7 10*3/uL (ref 0.1–1.0)
Monocytes Relative: 10 %
Neutro Abs: 5.3 10*3/uL (ref 1.7–7.7)
Neutrophils Relative %: 73 %
Platelet Count: 114 10*3/uL — ABNORMAL LOW (ref 150–400)
RBC: 4.3 MIL/uL (ref 3.87–5.11)
RDW: 16.8 % — ABNORMAL HIGH (ref 11.5–15.5)
WBC Count: 7.2 10*3/uL (ref 4.0–10.5)
nRBC: 0 % (ref 0.0–0.2)

## 2024-03-23 MED ORDER — HEPARIN SOD (PORK) LOCK FLUSH 100 UNIT/ML IV SOLN
500.0000 [IU] | Freq: Once | INTRAVENOUS | Status: AC
Start: 2024-03-23 — End: 2024-03-23
  Administered 2024-03-23: 500 [IU]

## 2024-03-23 MED ORDER — SODIUM CHLORIDE 0.9% FLUSH
10.0000 mL | Freq: Once | INTRAVENOUS | Status: AC
Start: 2024-03-23 — End: 2024-03-23
  Administered 2024-03-23: 10 mL

## 2024-03-24 ENCOUNTER — Encounter: Payer: Self-pay | Admitting: Hematology

## 2024-03-29 NOTE — Assessment & Plan Note (Signed)
-  stage IV with liver mets, MSS, KRAS G12R mutation (+) -diagnosed in 10/2023 -Patient has a history of breast cancer. She presents with dyspnea, chest discomfort, decreased appetite, and weight loss. She also reports new onset back pain. -CT showed a 2.9cm mass in pancreatic neck/body, and multiple liver mets, liver biopsy confirmed adenocarcinoma  -I reviewed the aggressive nature of pancreatic cancer, and the incurable nature of her disease due to diffuse liver metastasis. -she start first line chemo Nalirifox chemotherapy regimen on 11/12, with dose reduction for first cycle due to poor PS -Performed molecular testing on tumor tissue to identify potential targeted therapy options, unfortunately no actionable mutations detected  -She underwent genetic testing and came back negative

## 2024-03-29 NOTE — Addendum Note (Signed)
 Encounter addended by: Edward Qualia on: 03/29/2024 12:57 PM  Actions taken: Imaging Exam ended

## 2024-03-30 ENCOUNTER — Inpatient Hospital Stay

## 2024-03-30 ENCOUNTER — Inpatient Hospital Stay (HOSPITAL_BASED_OUTPATIENT_CLINIC_OR_DEPARTMENT_OTHER): Admitting: Hematology

## 2024-03-30 ENCOUNTER — Other Ambulatory Visit (HOSPITAL_COMMUNITY): Payer: Self-pay

## 2024-03-30 ENCOUNTER — Inpatient Hospital Stay: Attending: Hematology

## 2024-03-30 VITALS — BP 105/76 | HR 81 | Temp 97.7°F | Resp 17 | Wt 169.2 lb

## 2024-03-30 DIAGNOSIS — C259 Malignant neoplasm of pancreas, unspecified: Secondary | ICD-10-CM | POA: Diagnosis not present

## 2024-03-30 DIAGNOSIS — C251 Malignant neoplasm of body of pancreas: Secondary | ICD-10-CM | POA: Diagnosis not present

## 2024-03-30 DIAGNOSIS — C787 Secondary malignant neoplasm of liver and intrahepatic bile duct: Secondary | ICD-10-CM | POA: Diagnosis not present

## 2024-03-30 DIAGNOSIS — Z5189 Encounter for other specified aftercare: Secondary | ICD-10-CM | POA: Insufficient documentation

## 2024-03-30 DIAGNOSIS — Z5111 Encounter for antineoplastic chemotherapy: Secondary | ICD-10-CM | POA: Insufficient documentation

## 2024-03-30 DIAGNOSIS — Z170 Estrogen receptor positive status [ER+]: Secondary | ICD-10-CM

## 2024-03-30 LAB — CBC WITH DIFFERENTIAL (CANCER CENTER ONLY)
Abs Immature Granulocytes: 0.05 10*3/uL (ref 0.00–0.07)
Basophils Absolute: 0 10*3/uL (ref 0.0–0.1)
Basophils Relative: 0 %
Eosinophils Absolute: 0 10*3/uL (ref 0.0–0.5)
Eosinophils Relative: 1 %
HCT: 36.8 % (ref 36.0–46.0)
Hemoglobin: 11.9 g/dL — ABNORMAL LOW (ref 12.0–15.0)
Immature Granulocytes: 1 %
Lymphocytes Relative: 21 %
Lymphs Abs: 1.2 10*3/uL (ref 0.7–4.0)
MCH: 27.5 pg (ref 26.0–34.0)
MCHC: 32.3 g/dL (ref 30.0–36.0)
MCV: 85 fL (ref 80.0–100.0)
Monocytes Absolute: 0.9 10*3/uL (ref 0.1–1.0)
Monocytes Relative: 17 %
Neutro Abs: 3.3 10*3/uL (ref 1.7–7.7)
Neutrophils Relative %: 60 %
Platelet Count: 161 10*3/uL (ref 150–400)
RBC: 4.33 MIL/uL (ref 3.87–5.11)
RDW: 16.7 % — ABNORMAL HIGH (ref 11.5–15.5)
WBC Count: 5.5 10*3/uL (ref 4.0–10.5)
nRBC: 0 % (ref 0.0–0.2)

## 2024-03-30 LAB — CMP (CANCER CENTER ONLY)
ALT: 11 U/L (ref 0–44)
AST: 18 U/L (ref 15–41)
Albumin: 3.8 g/dL (ref 3.5–5.0)
Alkaline Phosphatase: 209 U/L — ABNORMAL HIGH (ref 38–126)
Anion gap: 5 (ref 5–15)
BUN: 5 mg/dL — ABNORMAL LOW (ref 8–23)
CO2: 27 mmol/L (ref 22–32)
Calcium: 9 mg/dL (ref 8.9–10.3)
Chloride: 105 mmol/L (ref 98–111)
Creatinine: 0.75 mg/dL (ref 0.44–1.00)
GFR, Estimated: >60 mL/min (ref >60)
Glucose, Bld: 152 mg/dL — ABNORMAL HIGH (ref 70–99)
Potassium: 3.6 mmol/L (ref 3.5–5.1)
Sodium: 137 mmol/L (ref 135–145)
Total Bilirubin: 0.4 mg/dL (ref 0.0–1.2)
Total Protein: 6.8 g/dL (ref 6.5–8.1)

## 2024-03-30 MED ORDER — GABAPENTIN 100 MG PO CAPS
100.0000 mg | ORAL_CAPSULE | Freq: Three times a day (TID) | ORAL | 1 refills | Status: DC
  Filled 2024-03-30: qty 90, 30d supply, fill #0

## 2024-03-30 MED ORDER — PALONOSETRON HCL INJECTION 0.25 MG/5ML
0.2500 mg | Freq: Once | INTRAVENOUS | Status: AC
  Administered 2024-03-30: 0.25 mg via INTRAVENOUS
  Filled 2024-03-30: qty 5

## 2024-03-30 MED ORDER — DEXAMETHASONE SODIUM PHOSPHATE 10 MG/ML IJ SOLN
10.0000 mg | Freq: Once | INTRAMUSCULAR | Status: AC
  Administered 2024-03-30: 10 mg via INTRAVENOUS
  Filled 2024-03-30: qty 1

## 2024-03-30 MED ORDER — FAMOTIDINE IN NACL 20-0.9 MG/50ML-% IV SOLN
20.0000 mg | Freq: Once | INTRAVENOUS | Status: AC
  Administered 2024-03-30: 20 mg via INTRAVENOUS
  Filled 2024-03-30: qty 50

## 2024-03-30 MED ORDER — SODIUM CHLORIDE 0.9% FLUSH
10.0000 mL | Freq: Once | INTRAVENOUS | Status: AC
Start: 2024-03-30 — End: 2024-03-30
  Administered 2024-03-30: 10 mL

## 2024-03-30 MED ORDER — DEXTROSE 5 % IV SOLN: INTRAVENOUS | Status: DC

## 2024-03-30 MED ORDER — LEUCOVORIN CALCIUM INJECTION 350 MG
400.0000 mg/m2 | Freq: Once | INTRAMUSCULAR | Status: AC
  Administered 2024-03-30: 788 mg via INTRAVENOUS
  Filled 2024-03-30: qty 25

## 2024-03-30 MED ORDER — APIXABAN 5 MG PO TABS
5.0000 mg | ORAL_TABLET | Freq: Two times a day (BID) | ORAL | 5 refills | Status: DC
Start: 2024-03-30 — End: 2024-08-31
  Filled 2024-03-30 – 2024-04-25 (×2): qty 60, 30d supply, fill #0
  Filled 2024-04-25: qty 180, 90d supply, fill #0
  Filled 2024-04-25 (×2): qty 60, 30d supply, fill #0
  Filled 2024-05-31 – 2024-06-01 (×3): qty 60, 30d supply, fill #1
  Filled 2024-07-11: qty 60, 30d supply, fill #2
  Filled 2024-08-16: qty 60, 30d supply, fill #3

## 2024-03-30 MED ORDER — SODIUM CHLORIDE 0.9 % IV SOLN
70.0000 mg/m2 | Freq: Once | INTRAVENOUS | Status: AC
  Administered 2024-03-30: 137.6 mg via INTRAVENOUS
  Filled 2024-03-30: qty 32

## 2024-03-30 MED ORDER — ATROPINE SULFATE 1 MG/ML IV SOLN
0.5000 mg | Freq: Once | INTRAVENOUS | Status: AC | PRN
  Administered 2024-03-30: 0.5 mg via INTRAVENOUS
  Filled 2024-03-30: qty 1

## 2024-03-30 MED ORDER — SODIUM CHLORIDE 0.9 % IV SOLN
4500.0000 mg | INTRAVENOUS | Status: DC
  Administered 2024-03-30: 4500 mg via INTRAVENOUS
  Filled 2024-03-30: qty 90

## 2024-03-30 MED ORDER — ATROPINE SULFATE 1 MG/ML IV SOLN: 0.4000 mg | Freq: Once | INTRAVENOUS | Status: DC

## 2024-03-30 NOTE — Progress Notes (Signed)
 Ok to adjust 5FU based on today's BSA per Dr Mosetta Putt 24000mg /m2 = 4500mg 

## 2024-03-30 NOTE — Patient Instructions (Signed)
 [facility] - A DEPT OF [facility]  Discharge Instructions: Thank you for choosing [facility] to provide your oncology and hematology care.   If you have a lab appointment with the Cancer Center, please go directly to the Cancer Center and check in at the registration area.   Wear comfortable clothing and clothing appropriate for easy access to any Portacath or PICC line.   We strive to give you quality time with your provider. You may need to reschedule your appointment if you arrive late (15 or more minutes).  Arriving late affects you and other patients whose appointments are after yours.  Also, if you miss three or more appointments without notifying the office, you may be dismissed from the clinic at the provider's discretion.      For prescription refill requests, have your pharmacy contact our office and allow 72 hours for refills to be completed.    Today you received the following chemotherapy and/or immunotherapy agents irinotecan liposomal, leucovorin      To help prevent nausea and vomiting after your treatment, we encourage you to take your nausea medication as directed.  BELOW ARE SYMPTOMS THAT SHOULD BE REPORTED IMMEDIATELY: *FEVER GREATER THAN 100.4 F (38 C) OR HIGHER *CHILLS OR SWEATING *NAUSEA AND VOMITING THAT IS NOT CONTROLLED WITH YOUR NAUSEA MEDICATION *UNUSUAL SHORTNESS OF BREATH *UNUSUAL BRUISING OR BLEEDING *URINARY PROBLEMS (pain or burning when urinating, or frequent urination) *BOWEL PROBLEMS (unusual diarrhea, constipation, pain near the anus) TENDERNESS IN MOUTH AND THROAT WITH OR WITHOUT PRESENCE OF ULCERS (sore throat, sores in mouth, or a toothache) UNUSUAL RASH, SWELLING OR PAIN  UNUSUAL VAGINAL DISCHARGE OR ITCHING   Items with * indicate a potential emergency and should be followed up as soon as possible or go to the Emergency Department if any problems should occur.  Please show the CHEMOTHERAPY ALERT  CARD or IMMUNOTHERAPY ALERT CARD at check-in to the Emergency Department and triage nurse.  Should you have questions after your visit or need to cancel or reschedule your appointment, please contact [facility] - A DEPT OF [facility]  Dept: [phone]  and follow the prompts.  Office hours are 8:00 a.m. to 4:30 p.m. Monday - Friday. Please note that voicemails left after 4:00 p.m. may not be returned until the following business day.  We are closed weekends and major holidays. You have access to a nurse at all times for urgent questions. Please call the main number to the clinic Dept: [phone] and follow the prompts.   For any non-urgent questions, you may also contact your provider using MyChart. We now offer e-Visits for anyone 34 and older to request care online for non-urgent symptoms. For details visit [email or web address].   Also download the MyChart app! Go to the app store, search "MyChart", open the app, select , and log in with your MyChart username and password.

## 2024-03-30 NOTE — Progress Notes (Signed)
 [facility]   [phone] [phone]   Clinic Follow up Note   Patient Care Team: [name], PA as PCP - General (Nurse Practitioner) [name], MD as Consulting Physician (Hematology and Oncology) [name], MD as Consulting Physician (Radiation Oncology) [name], MD as Consulting Physician (General Surgery) [name], MD as Consulting Physician (Hematology and Oncology)  Date of Service:  03/30/2024  CHIEF COMPLAINT: f/u of pancreatic cancer  CURRENT THERAPY:  First-line chemotherapy NALIRIFOX   Oncology History   Pancreatic cancer (HCC) -stage IV with liver mets, MSS, KRAS G12R mutation (+) -diagnosed in 10/2023 -Patient has a history of breast cancer. She presents with dyspnea, chest discomfort, decreased appetite, and weight loss. She also reports new onset back pain. -CT showed a 2.9cm mass in pancreatic neck/body, and multiple liver mets, liver biopsy confirmed adenocarcinoma  -I reviewed the aggressive nature of pancreatic cancer, and the incurable nature of her disease due to diffuse liver metastasis. -she start first line chemo Nalirifox chemotherapy regimen on 11/12, with dose reduction for first cycle due to poor PS -Performed molecular testing on tumor tissue to identify potential targeted therapy options, unfortunately no actionable mutations detected  -She underwent genetic testing and came back negative    Assessment and Plan    Pancreatic cancer She has pancreatic cancer with KRAS G12R and TP53 mutations. No FDA-approved drugs are available for these mutations. The tumor mutation burden is low, and MSI is stable, indicating immunotherapy is unlikely to be effective. She is experiencing weight loss and poor appetite, likely due to chemotherapy side effects. The Tempus test was conducted to identify potential mutations for targeted therapy, and informed consent was obtained for the insurance appeal process. 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 Gabapentin is being initiated to manage symptoms. The main side effect of gabapentin is drowsiness. She is advised to start dosing at night to minimize daytime drowsiness and gradually increase the dose as tolerated. - Initiate gabapentin at 100 mg at night, gradually increasing to 300 mg as tolerated - Educate on potential side effects, including drowsiness, and advise to start dosing at night  Anticoagulation management On Eliquis (apixaban) 5 mg twice daily for anticoagulation. Requires a refill of her prescription. - Prescribe a one-month supply of Eliquis with five refills  Plan -Lab reviewed, adequate for treatment, will proceed with chemotherapy today and continue every 2 weeks -Follow-up in 2 weeks before next cycle chemo - Advise to schedule CT scan for the week of April 23rd, CT scan ordered - Discuss potential for a chemotherapy break around the Fourth of July         SUMMARY OF ONCOLOGIC HISTORY: Oncology History  Malignant neoplasm of overlapping sites of left breast in female, estrogen receptor positive (HCC)  08/04/2018 Initial Diagnosis   Screening mammogram detected left breast mass at 9:30 position 1.2 x 1.2 x 1.1 cm.  Closer to the nipple at 9:30 position 0.8 cm lesion span 3.6 cm and a 1.5 cm apart.  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 position 2 cm from nipple, at 1 cm from nipple PASH, lymph node benign, ER 90%, PR 95%, Ki-67 20%, HER-2 negative  by IHC 1+, T1CN0 stage Ia AJCC 8   09/29/2018 Surgery   Left lumpectomy: Grade 3 IDC, 1.7 cm, margins negative, lymphovascular invasion present, 0/2 lymph nodes  negative, T1c N0 stage Ia   10/06/2018 Cancer Staging   Staging form: Breast, AJCC 8th Edition - Pathologic: Stage IA (pT1c, pN0(sn), cM0, G3, ER+, PR+, HER2-) - Signed by [name], MD on 10/06/2018   10/22/2018 Oncotype testing   Oncotype score 13: Distant recurrence at 9 years with hormone therapy alone 4%   11/12/2018 - 12/27/2018 Radiation Therapy   Adjuvant radiation therapy   12/27/2018 -  Anti-estrogen oral therapy   Antiestrogen therapy with letrozole 2.5 mg daily   11/30/2023 Genetic Testing   Negative genetic testing on the CancerNext-Expanded+RNAinsight panel.  VUS identified in KIT c.2083G>C and PMS2 c.-1C>A.  The report date is 11/30/2023.  The CancerNext-Expanded gene panel offered by [facility] and includes sequencing, rearrangement, and RNA analysis for the following 76 genes: AIP, ALK, APC, ATM, AXIN2, BAP1, BARD1, BMPR1A, BRCA1, BRCA2, BRIP1, CDC73, CDH1, CDK4, CDKN1B, CDKN2A, CEBPA, CHEK2, CTNNA1, DDX41, DICER1, ETV6, FH, FLCN, GATA2, LZTR1, MAX, MBD4, MEN1, MET, MLH1, MSH2, MSH3, MSH6, MUTYH, NF1, NF2, NTHL1, PALB2, PHOX2B, PMS2, POT1, PRKAR1A, PTCH1, PTEN, RAD51C, RAD51D, RB1, RET, RUNX1, SDHA, SDHAF2, SDHB, SDHC, SDHD, SMAD4, SMARCA4, SMARCB1, SMARCE1, STK11, SUFU, TMEM127, TP53, TSC1, TSC2, VHL, and WT1 (sequencing and deletion/duplication); EGFR, HOXB13, KIT, MITF, PDGFRA, POLD1, and POLE (sequencing only); EPCAM and GREM1 (deletion/duplication only).    Pancreatic cancer (HCC)  11/03/2023 Initial Diagnosis   Pancreatic cancer (HCC)   11/11/2023 -  Chemotherapy   Patient is on Treatment Plan : PANCREAS NALIRIFOX D1, 15 Q28D     11/30/2023 Genetic Testing   Negative genetic testing on the CancerNext-Expanded+RNAinsight panel.  VUS identified in KIT c.2083G>C and PMS2 c.-1C>A.  The report date is 11/30/2023.  The CancerNext-Expanded gene panel offered by [facility] and includes sequencing, rearrangement, and RNA analysis for the following 76 genes: AIP, ALK,  APC, ATM, AXIN2, BAP1, BARD1, BMPR1A, BRCA1, BRCA2, BRIP1, CDC73, CDH1, CDK4, CDKN1B, CDKN2A, CEBPA, CHEK2, CTNNA1, DDX41, DICER1, ETV6, FH, FLCN, GATA2, LZTR1, MAX, MBD4, MEN1, MET, MLH1, MSH2, MSH3, MSH6, MUTYH, NF1, NF2, NTHL1, PALB2, PHOX2B, PMS2, POT1, PRKAR1A, PTCH1, PTEN, RAD51C, RAD51D, RB1, RET, RUNX1, SDHA, SDHAF2, SDHB, SDHC, SDHD, SMAD4, SMARCA4, SMARCB1, SMARCE1, STK11, SUFU, TMEM127, TP53, TSC1, TSC2, VHL, and WT1 (sequencing and deletion/duplication); EGFR, HOXB13, KIT, MITF, PDGFRA, POLD1, and POLE (sequencing only); EPCAM and GREM1 (deletion/duplication only).    01/29/2024 Imaging   CT abdomen and pelvis with contrast  IMPRESSION: 1. Diminished size of a hypodense mass of the pancreatic neck, consistent with treatment response. 2. Diminished size and hypodensity of numerous liver metastases, with new overlying pseudocirrhotic capsular retraction, consistent with treatment response. 3. 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 4. Unchanged prominent, nonspecific retroperitoneal lymph nodes.  5. Underlying hepatic steatosis.      Discussed the use of AI scribe software for clinical note transcription with the patient, who gave verbal consent to proceed.  History of Present Illness   Ashley Clark, a 63 year old female with a known diagnosis of pancreatic cancer, presents for a routine follow-up. She reports feeling tired but otherwise does not feel sick. She mentions that she is due for a refill of her Eliquis medication and needs a new prescription for gabapentin for her neuropathy. She is starting gabapentin for the first time and has been advised to start with a low dose and gradually increase it.  She also mentions that she has lost some weight and has a poor appetite, which she attributes to the chemotherapy altering her taste. She is also planning for her upcoming CT scan and is trying to coordinate her treatment schedule with her vacation plans.          All other systems were reviewed with the patient and are negative.  MEDICAL HISTORY:  Past Medical History:  Diagnosis Date   Acute pulmonary embolism (HCC) 10/29/2023   Allergy    Back pain    Cancer (HCC) 2019   left breast cancer-last radiation Dec.2019   Diabetes mellitus without complication [facility])    Medical history non-contributory    Personal history of radiation therapy    Port-A-Cath in place - removed 12-11-2023 due to port-a-cath infection. 11/13/2023   Prediabetes    Tiredness    Vitamin D deficiency     SURGICAL HISTORY: Past Surgical History:  Procedure Laterality Date   BREAST BIOPSY Left 08/04/2018   x3   BREAST LUMPECTOMY Left    09-29-18   BREAST LUMPECTOMY WITH RADIOACTIVE SEED AND SENTINEL LYMPH NODE BIOPSY Left 09/29/2018   Procedure: LEFT BREAST LUMPECTOMY WITH RADIOACTIVE SEED AND SENTINEL LYMPH NODE BIOPSY;  Surgeon: [name], MD;  Location: [facility];  Service: General;  Laterality: Left;   COLONOSCOPY  2016   IR IMAGING GUIDED PORT INSERTION  11/03/2023   IR PATIENT EVAL TECH 0-60 MINS  12/18/2023   IR PATIENT EVAL TECH 0-60 MINS  12/24/2023   IR PATIENT EVAL TECH 0-60 MINS  12/28/2023   IR PATIENT EVAL TECH 0-60 MINS  01/04/2024   IR PATIENT EVAL TECH 0-60 MINS  01/13/2024   IR PATIENT EVAL TECH 0-60 MINS  01/22/2024   IR RADIOLOGIST EVAL & MGMT  12/15/2023   IR REMOVAL TUN ACCESS W/ PORT W/O FL MOD SED  12/11/2023   POLYPECTOMY     WISDOM TOOTH EXTRACTION      I have reviewed the social history and family history with the patient and they are unchanged from previous note.  ALLERGIES:  is allergic to irinotecan liposome.  MEDICATIONS:  Current Outpatient Medications  Medication Sig Dispense Refill   gabapentin (NEURONTIN) 100 MG capsule Take 1 capsule (100 mg total) by mouth 3 (three) times daily. 90 capsule 1   apixaban (ELIQUIS) 5 MG TABS tablet Take 1 tablet (5 mg total) by mouth 2 (two) times daily. 60 tablet 5    cetirizine (ZYRTEC) 10 MG chewable tablet Chew 10 mg by mouth daily.     lidocaine-prilocaine (EMLA) cream Apply to affected area once (Patient taking differently: Apply 1 Application topically See admin instructions. Apply to affected area for port access) 30 g 3   ondansetron (ZOFRAN) 8 MG tablet Take 1 tablet (8 mg total) by mouth every 8 (eight) hours as needed for nausea or vomiting. Start on the third day after irinotecan 30 tablet 1   prochlorperazine (COMPAZINE) 10 MG tablet Take 1 tablet (10 mg total) by mouth every 6 (six) hours as needed for nausea or vomiting. 30 tablet 1   No current facility-administered medications for this visit.   Facility-Administered Medications Ordered in Other Visits  Medication Dose Route Frequency Provider Last Rate Last Admin   atropine injection 0.4 mg  0.4 mg Intravenous Once [name], MD       dextrose 5 % solution   Intravenous Continuous [name], MD       fluorouracil (ADRUCIL) 4,500 mg in  sodium chloride 0.9 % 60 mL chemo infusion  4,500 mg Intravenous 1 day or 1 dose [name], MD   Infusion Verify at 03/30/24 1418   heparin lock flush 100 unit/mL  500 Units Intracatheter Once PRN [name], MD       sodium chloride flush (NS) 0.9 % injection 10 mL  10 mL Intracatheter PRN [name], MD        PHYSICAL EXAMINATION: ECOG PERFORMANCE STATUS: 1 - Symptomatic but completely ambulatory  Vitals:   03/30/24 0938  BP: 105/76  Pulse: 81  Resp: 17  Temp: 97.7 F (36.5 C)  SpO2: 100%   Wt Readings from Last 3 Encounters:  03/30/24 169 lb 3.2 oz (76.7 kg)  03/16/24 171 lb 14.4 oz (78 kg)  03/02/24 174 lb 3.2 oz (79 kg)     GENERAL:alert, no distress and comfortable SKIN: skin color, texture, turgor are normal, no rashes or significant lesions EYES: normal, Conjunctiva are pink and non-injected, sclera clear NECK: supple, thyroid normal size, non-tender, without nodularity LYMPH:  no palpable lymphadenopathy in the cervical, axillary   LUNGS: clear to auscultation and percussion with normal breathing effort HEART: regular rate & rhythm and no murmurs and no lower extremity edema ABDOMEN:abdomen soft, non-tender and normal bowel sounds Musculoskeletal:no cyanosis of digits and no clubbing  NEURO: alert & oriented x 3 with fluent speech, no focal motor/sensory deficits   LABORATORY DATA:  I have reviewed the data as listed    Latest Ref Rng & Units 03/30/2024    8:58 AM 03/23/2024    2:23 PM 03/16/2024   10:33 AM  CBC  WBC 4.0 - 10.5 K/uL 5.5  7.2  6.6   Hemoglobin 12.0 - 15.0 g/dL 16.1  09.6  04.5   Hematocrit 36.0 - 46.0 % 36.8  36.5  37.3   Platelets 150 - 400 K/uL 161  114  169         Latest Ref Rng & Units 03/30/2024    8:58 AM 03/23/2024    2:23 PM 03/16/2024   10:33 AM  CMP  Glucose 70 - 99 mg/dL 409  811  914   BUN 8 - 23 mg/dL 5  6  <5   Creatinine 7.82 - 1.00 mg/dL 9.56  2.13  0.86   Sodium 135 - 145 mmol/L 137  137  139   Potassium 3.5 - 5.1 mmol/L 3.6  3.4  3.4   Chloride 98 - 111 mmol/L 105  103  104   CO2 22 - 32 mmol/L 27  27  30    Calcium 8.9 - 10.3 mg/dL 9.0  9.1  9.1   Total Protein 6.5 - 8.1 g/dL 6.8  7.0  6.7   Total Bilirubin 0.0 - 1.2 mg/dL 0.4  0.4  0.3   Alkaline Phos 38 - 126 U/L 209  220  184   AST 15 - 41 U/L 18  20  22    ALT 0 - 44 U/L 11  15  12        RADIOGRAPHIC STUDIES: I have personally reviewed the radiological images as listed and agreed with the findings in the report. No results found.    Orders Placed This Encounter  Procedures   CT CHEST ABDOMEN PELVIS W CONTRAST    Standing Status:   Future    Expected Date:   04/20/2024    Expiration Date:   03/30/2025    If indicated for the ordered procedure, I authorize the administration of contrast  media per Radiology protocol:   Yes    Does the patient have a contrast media/X-ray dye allergy?:   No    Preferred imaging location?:   [facility]    If indicated for the ordered procedure, I authorize the  administration of oral contrast media per Radiology protocol:   Yes   All questions were answered. The patient knows to call the clinic with any problems, questions or concerns. No barriers to learning was detected. The total time spent in the appointment was 25 minutes.     [name], MD 03/30/2024

## 2024-03-31 ENCOUNTER — Other Ambulatory Visit: Payer: Self-pay

## 2024-03-31 DIAGNOSIS — C251 Malignant neoplasm of body of pancreas: Secondary | ICD-10-CM

## 2024-04-01 ENCOUNTER — Inpatient Hospital Stay

## 2024-04-01 ENCOUNTER — Ambulatory Visit

## 2024-04-01 ENCOUNTER — Encounter

## 2024-04-01 ENCOUNTER — Other Ambulatory Visit: Payer: Self-pay

## 2024-04-01 VITALS — BP 104/73 | HR 69 | Resp 16

## 2024-04-01 DIAGNOSIS — C251 Malignant neoplasm of body of pancreas: Secondary | ICD-10-CM | POA: Diagnosis not present

## 2024-04-01 DIAGNOSIS — C787 Secondary malignant neoplasm of liver and intrahepatic bile duct: Secondary | ICD-10-CM | POA: Diagnosis not present

## 2024-04-01 DIAGNOSIS — Z5189 Encounter for other specified aftercare: Secondary | ICD-10-CM | POA: Diagnosis not present

## 2024-04-01 DIAGNOSIS — Z5111 Encounter for antineoplastic chemotherapy: Secondary | ICD-10-CM | POA: Diagnosis not present

## 2024-04-01 DIAGNOSIS — C50812 Malignant neoplasm of overlapping sites of left female breast: Secondary | ICD-10-CM

## 2024-04-01 MED ORDER — SODIUM CHLORIDE 0.9% FLUSH
10.0000 mL | Freq: Once | INTRAVENOUS | Status: AC
  Administered 2024-04-01: 10 mL

## 2024-04-01 MED ORDER — HEPARIN SOD (PORK) LOCK FLUSH 100 UNIT/ML IV SOLN
500.0000 [IU] | Freq: Once | INTRAVENOUS | Status: AC
  Administered 2024-04-01: 500 [IU]

## 2024-04-01 MED ORDER — PEGFILGRASTIM INJECTION 6 MG/0.6ML ~~LOC~~
6.0000 mg | PREFILLED_SYRINGE | Freq: Once | SUBCUTANEOUS | Status: AC
  Administered 2024-04-01: 6 mg via SUBCUTANEOUS
  Filled 2024-04-01: qty 0.6

## 2024-04-02 ENCOUNTER — Other Ambulatory Visit: Payer: Self-pay

## 2024-04-06 ENCOUNTER — Other Ambulatory Visit: Payer: Self-pay

## 2024-04-06 ENCOUNTER — Inpatient Hospital Stay

## 2024-04-06 DIAGNOSIS — C251 Malignant neoplasm of body of pancreas: Secondary | ICD-10-CM | POA: Diagnosis not present

## 2024-04-06 DIAGNOSIS — Z5189 Encounter for other specified aftercare: Secondary | ICD-10-CM | POA: Diagnosis not present

## 2024-04-06 DIAGNOSIS — Z5111 Encounter for antineoplastic chemotherapy: Secondary | ICD-10-CM | POA: Diagnosis not present

## 2024-04-06 DIAGNOSIS — C787 Secondary malignant neoplasm of liver and intrahepatic bile duct: Secondary | ICD-10-CM | POA: Diagnosis not present

## 2024-04-06 DIAGNOSIS — C50812 Malignant neoplasm of overlapping sites of left female breast: Secondary | ICD-10-CM

## 2024-04-06 MED ORDER — SODIUM CHLORIDE 0.9% FLUSH
10.0000 mL | Freq: Once | INTRAVENOUS | Status: AC
  Administered 2024-04-06: 10 mL

## 2024-04-06 MED ORDER — HEPARIN SOD (PORK) LOCK FLUSH 100 UNIT/ML IV SOLN
500.0000 [IU] | Freq: Once | INTRAVENOUS | Status: AC
  Administered 2024-04-06: 500 [IU]

## 2024-04-08 LAB — MISCELLANEOUS TEST

## 2024-04-10 DIAGNOSIS — C259 Malignant neoplasm of pancreas, unspecified: Secondary | ICD-10-CM | POA: Diagnosis not present

## 2024-04-10 DIAGNOSIS — C50812 Malignant neoplasm of overlapping sites of left female breast: Secondary | ICD-10-CM | POA: Diagnosis not present

## 2024-04-12 ENCOUNTER — Other Ambulatory Visit: Payer: Self-pay

## 2024-04-12 NOTE — Assessment & Plan Note (Signed)
-  stage IV with liver mets, MSS, KRAS G12R mutation (+) -diagnosed in 10/2023 -Patient has a history of breast cancer. She presents with dyspnea, chest discomfort, decreased appetite, and weight loss. She also reports new onset back pain. -CT showed a 2.9cm mass in pancreatic neck/body, and multiple liver mets, liver biopsy confirmed adenocarcinoma  -I reviewed the aggressive nature of pancreatic cancer, and the incurable nature of her disease due to diffuse liver metastasis. -she start first line chemo Nalirifox chemotherapy regimen on 11/12, with dose reduction for first cycle due to poor PS -Performed molecular testing on tumor tissue to identify potential targeted therapy options, unfortunately no actionable mutations detected  -She underwent genetic testing and came back negative

## 2024-04-13 ENCOUNTER — Inpatient Hospital Stay

## 2024-04-13 ENCOUNTER — Inpatient Hospital Stay (HOSPITAL_BASED_OUTPATIENT_CLINIC_OR_DEPARTMENT_OTHER): Admitting: Hematology

## 2024-04-13 VITALS — BP 128/78 | HR 68 | Temp 97.6°F | Resp 20 | Ht 61.5 in | Wt 171.8 lb

## 2024-04-13 DIAGNOSIS — C251 Malignant neoplasm of body of pancreas: Secondary | ICD-10-CM

## 2024-04-13 DIAGNOSIS — Z5111 Encounter for antineoplastic chemotherapy: Secondary | ICD-10-CM | POA: Diagnosis not present

## 2024-04-13 DIAGNOSIS — C259 Malignant neoplasm of pancreas, unspecified: Secondary | ICD-10-CM | POA: Diagnosis not present

## 2024-04-13 DIAGNOSIS — Z170 Estrogen receptor positive status [ER+]: Secondary | ICD-10-CM

## 2024-04-13 DIAGNOSIS — Z5189 Encounter for other specified aftercare: Secondary | ICD-10-CM | POA: Diagnosis not present

## 2024-04-13 DIAGNOSIS — C787 Secondary malignant neoplasm of liver and intrahepatic bile duct: Secondary | ICD-10-CM | POA: Diagnosis not present

## 2024-04-13 LAB — CMP (CANCER CENTER ONLY)
ALT: 11 U/L (ref 0–44)
AST: 19 U/L (ref 15–41)
Albumin: 3.8 g/dL (ref 3.5–5.0)
Alkaline Phosphatase: 215 U/L — ABNORMAL HIGH (ref 38–126)
Anion gap: 6 (ref 5–15)
BUN: 9 mg/dL (ref 8–23)
CO2: 29 mmol/L (ref 22–32)
Calcium: 9.2 mg/dL (ref 8.9–10.3)
Chloride: 106 mmol/L (ref 98–111)
Creatinine: 0.79 mg/dL (ref 0.44–1.00)
GFR, Estimated: >60 mL/min
Glucose, Bld: 114 mg/dL — ABNORMAL HIGH (ref 70–99)
Potassium: 3.5 mmol/L (ref 3.5–5.1)
Sodium: 141 mmol/L (ref 135–145)
Total Bilirubin: 0.3 mg/dL (ref 0.0–1.2)
Total Protein: 6.6 g/dL (ref 6.5–8.1)

## 2024-04-13 LAB — CBC WITH DIFFERENTIAL (CANCER CENTER ONLY)
Abs Immature Granulocytes: 0.1 K/uL — ABNORMAL HIGH (ref 0.00–0.07)
Basophils Absolute: 0 K/uL (ref 0.0–0.1)
Basophils Relative: 0 %
Eosinophils Absolute: 0.1 K/uL (ref 0.0–0.5)
Eosinophils Relative: 1 %
HCT: 36.4 % (ref 36.0–46.0)
Hemoglobin: 12 g/dL (ref 12.0–15.0)
Immature Granulocytes: 2 %
Lymphocytes Relative: 20 %
Lymphs Abs: 1.3 K/uL (ref 0.7–4.0)
MCH: 27.6 pg (ref 26.0–34.0)
MCHC: 33 g/dL (ref 30.0–36.0)
MCV: 83.7 fL (ref 80.0–100.0)
Monocytes Absolute: 0.8 K/uL (ref 0.1–1.0)
Monocytes Relative: 12 %
Neutro Abs: 4.4 K/uL (ref 1.7–7.7)
Neutrophils Relative %: 65 %
Platelet Count: 186 K/uL (ref 150–400)
RBC: 4.35 MIL/uL (ref 3.87–5.11)
RDW: 16.2 % — ABNORMAL HIGH (ref 11.5–15.5)
WBC Count: 6.7 K/uL (ref 4.0–10.5)
nRBC: 0 % (ref 0.0–0.2)

## 2024-04-13 MED ORDER — DEXAMETHASONE SODIUM PHOSPHATE 10 MG/ML IJ SOLN
10.0000 mg | Freq: Once | INTRAMUSCULAR | Status: AC
Start: 2024-04-13 — End: 2024-04-13
  Administered 2024-04-13: 10 mg via INTRAVENOUS
  Filled 2024-04-13: qty 1

## 2024-04-13 MED ORDER — LEUCOVORIN CALCIUM INJECTION 350 MG
400.0000 mg/m2 | Freq: Once | INTRAVENOUS | Status: AC
Start: 2024-04-13 — End: 2024-04-13
  Administered 2024-04-13: 788 mg via INTRAVENOUS
  Filled 2024-04-13: qty 25

## 2024-04-13 MED ORDER — SODIUM CHLORIDE 0.9 % IV SOLN: INTRAVENOUS | Status: DC

## 2024-04-13 MED ORDER — SODIUM CHLORIDE 0.9% FLUSH
10.0000 mL | Freq: Once | INTRAVENOUS | Status: AC
  Administered 2024-04-13: 10 mL

## 2024-04-13 MED ORDER — DEXTROSE 5 % IV SOLN: INTRAVENOUS | Status: DC

## 2024-04-13 MED ORDER — ATROPINE SULFATE 1 MG/ML IV SOLN
0.4000 mg | Freq: Once | INTRAVENOUS | Status: AC
  Administered 2024-04-13: 0.4 mg via INTRAVENOUS
  Filled 2024-04-13: qty 1

## 2024-04-13 MED ORDER — FAMOTIDINE IN NACL 20-0.9 MG/50ML-% IV SOLN
20.0000 mg | Freq: Once | INTRAVENOUS | Status: AC
  Administered 2024-04-13: 20 mg via INTRAVENOUS
  Filled 2024-04-13: qty 50

## 2024-04-13 MED ORDER — SODIUM CHLORIDE 0.9 % IV SOLN
70.0000 mg/m2 | Freq: Once | INTRAVENOUS | Status: AC
  Administered 2024-04-13: 137.6 mg via INTRAVENOUS
  Filled 2024-04-13: qty 32

## 2024-04-13 MED ORDER — SODIUM CHLORIDE 0.9 % IV SOLN
4500.0000 mg | INTRAVENOUS | Status: DC
  Administered 2024-04-13: 4500 mg via INTRAVENOUS
  Filled 2024-04-13: qty 90

## 2024-04-13 MED ORDER — PALONOSETRON HCL INJECTION 0.25 MG/5ML
0.2500 mg | Freq: Once | INTRAVENOUS | Status: AC
  Administered 2024-04-13: 0.25 mg via INTRAVENOUS
  Filled 2024-04-13: qty 5

## 2024-04-13 NOTE — Progress Notes (Signed)
 [facility]   Telephone:([phone] Fax:([phone]) [phone]   Clinic Follow up Note   Patient Care Team: [name], [name] as PCP - General (Nurse Practitioner) [name], MD as Consulting Physician (Hematology and Oncology) [name], MD as Consulting Physician (Radiation Oncology) [name], MD as Consulting Physician (General Surgery) [name], MD as Consulting Physician (Hematology and Oncology)  Date of Service:  04/13/2024  CHIEF COMPLAINT: f/u of pancreatic cancer  CURRENT THERAPY:  FOLFIRI, oxaliplatin stopped due to neuropathy  Oncology History   Pancreatic cancer (HCC) -stage IV with liver mets, MSS, KRAS G12R mutation (+) -diagnosed in 10/2023 -Patient has a history of breast cancer. She presents with dyspnea, chest discomfort, decreased appetite, and weight loss. She also reports new onset back pain. -CT showed a 2.9cm mass in pancreatic neck/body, and multiple liver mets, liver biopsy confirmed adenocarcinoma  -I reviewed the aggressive nature of pancreatic cancer, and the incurable nature of her disease due to diffuse liver metastasis. -she start first line chemo Nalirifox chemotherapy regimen on 11/12, with dose reduction for first cycle due to poor PS -Performed molecular testing on tumor tissue to identify potential targeted therapy options, unfortunately no actionable mutations detected  -She underwent genetic testing and came back negative    Assessment & Plan Pancreatic cancer Currently receiving Irinotecan and Avanafil after discontinuation of Oxaliplatin due to neuropathy. Reports improvement in overall well-being since stopping Oxaliplatin. A scan is scheduled for April 23 to evaluate treatment response. Continuation of the current regimen is planned if the scan indicates a good response or stable disease. Treatment adjustments will be made in the event of disease progression. - Continue Irinotecan and 5-fu - Order scan for April 23  to evaluate treatment response - Adjust treatment plan based on scan results  Chemotherapy-induced peripheral neuropathy Experiencing numbness and tingling in fingers and toes, exacerbated postprandially. Symptoms are residual effects of Oxaliplatin. Currently on gabapentin for tingling, with potential dose increase at night to reduce drowsiness. B12 supplementation and exercises are advised to enhance circulation and nerve regeneration. - Continue gabapentin, 2 capsules in the evening and 1 in the morning - Increase gabapentin dose if tingling persists, especially at night - Recommend B12 supplementation and exercises  Constipation Constipation managed with Miralax and apple juice. No chemotherapy-induced diarrhea reported. - Continue Miralax and apple juice  Plan - Lab reviewed, adequate for treatment, will proceed for Thad Ranger today - She is scheduled for restaging CT on April 23 - Follow-up in 2 weeks before next cycle chemo and review CT scan findings     SUMMARY OF ONCOLOGIC HISTORY: Oncology History  Malignant neoplasm of overlapping sites of left breast in female, estrogen receptor positive (HCC)  08/04/2018 Initial Diagnosis   Screening mammogram detected left breast mass at 9:30 position 1.2 x 1.2 x 1.1 cm.  Closer to the nipple at 9:30 position 0.8 cm lesion span 3.6 cm and a 1.5 cm apart.  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 position 2 cm from nipple, at 1 cm from nipple PASH, lymph node benign, ER 90%, PR 95%, Ki-67 20%, HER-2 negative by IHC 1+, T1CN0 stage Ia AJCC 8   09/29/2018 Surgery   Left lumpectomy: Grade 3 IDC, 1.7 cm, margins negative, lymphovascular invasion present, 0/2 lymph nodes negative, T1c N0 stage Ia   10/06/2018 Cancer Staging   Staging form: Breast, AJCC 8th Edition - Pathologic: Stage IA (  pT1c, pN0(sn), cM0, G3, ER+, PR+, HER2-) - Signed by [name], MD on 10/06/2018    10/22/2018 Oncotype testing   Oncotype score 13: Distant recurrence at 9 years with hormone therapy alone 4%   11/12/2018 - 12/27/2018 Radiation Therapy   Adjuvant radiation therapy   12/27/2018 -  Anti-estrogen oral therapy   Antiestrogen therapy with letrozole 2.5 mg daily   11/30/2023 Genetic Testing   Negative genetic testing on the CancerNext-Expanded+RNAinsight panel.  VUS identified in KIT c.2083G>C and PMS2 c.-1C>A.  The report date is 11/30/2023.  The CancerNext-Expanded gene panel offered by [facility] and includes sequencing, rearrangement, and RNA analysis for the following 76 genes: AIP, ALK, APC, ATM, AXIN2, BAP1, BARD1, BMPR1A, BRCA1, BRCA2, BRIP1, CDC73, CDH1, CDK4, CDKN1B, CDKN2A, CEBPA, CHEK2, CTNNA1, DDX41, DICER1, ETV6, FH, FLCN, GATA2, LZTR1, MAX, MBD4, MEN1, MET, MLH1, MSH2, MSH3, MSH6, MUTYH, NF1, NF2, NTHL1, PALB2, PHOX2B, PMS2, POT1, PRKAR1A, PTCH1, PTEN, RAD51C, RAD51D, RB1, RET, RUNX1, SDHA, SDHAF2, SDHB, SDHC, SDHD, SMAD4, SMARCA4, SMARCB1, SMARCE1, STK11, SUFU, TMEM127, TP53, TSC1, TSC2, VHL, and WT1 (sequencing and deletion/duplication); EGFR, HOXB13, KIT, MITF, PDGFRA, POLD1, and POLE (sequencing only); EPCAM and GREM1 (deletion/duplication only).    Pancreatic cancer (HCC)  11/03/2023 Initial Diagnosis   Pancreatic cancer (HCC)   11/11/2023 -  Chemotherapy   Patient is on Treatment Plan : PANCREAS NALIRIFOX D1, 15 Q28D     11/30/2023 Genetic Testing   Negative genetic testing on the CancerNext-Expanded+RNAinsight panel.  VUS identified in KIT c.2083G>C and PMS2 c.-1C>A.  The report date is 11/30/2023.  The CancerNext-Expanded gene panel offered by [name] and includes sequencing, rearrangement, and RNA analysis for the following 76 genes: AIP, ALK, APC, ATM, AXIN2, BAP1, BARD1, BMPR1A, BRCA1, BRCA2, BRIP1, CDC73, CDH1, CDK4, CDKN1B, CDKN2A, CEBPA, CHEK2, CTNNA1, DDX41, DICER1, ETV6, FH, FLCN, GATA2, LZTR1, MAX, MBD4, MEN1, MET, MLH1, MSH2, MSH3, MSH6,  MUTYH, NF1, NF2, NTHL1, PALB2, PHOX2B, PMS2, POT1, PRKAR1A, PTCH1, PTEN, RAD51C, RAD51D, RB1, RET, RUNX1, SDHA, SDHAF2, SDHB, SDHC, SDHD, SMAD4, SMARCA4, SMARCB1, SMARCE1, STK11, SUFU, TMEM127, TP53, TSC1, TSC2, VHL, and WT1 (sequencing and deletion/duplication); EGFR, HOXB13, KIT, MITF, PDGFRA, POLD1, and POLE (sequencing only); EPCAM and GREM1 (deletion/duplication only).    01/29/2024 Imaging   CT abdomen and pelvis with contrast  IMPRESSION: 1. Diminished size of a hypodense mass of the pancreatic neck, consistent with treatment response. 2. Diminished size and hypodensity of numerous liver metastases, with new overlying pseudocirrhotic capsular retraction, consistent with treatment response. 3. 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 4. Unchanged prominent, nonspecific retroperitoneal lymph nodes.  5. Underlying hepatic steatosis.      Discussed the use of AI scribe software for clinical note transcription with the patient, who gave verbal consent to proceed.  History of Present Illness The patient, a 63 year old with pancreatic cancer, presents with worsening fatigue and neuropathy. She describes the neuropathy as a combination of numbness and tingling in her fingers and toes, which is exacerbated by eating. The patient also reports a new onset of left hip pain. She has been taking gabapentin for the neuropathy and has heard that B complex or B12 might help with nerve regeneration. Despite the neuropathy, she is still able to perform fine motor tasks such as putting on earrings and buttoning her shirt.  In addition to the neuropathy, the patient has been experiencing constipation, which she manages with Miralax and apple juice. She reports feeling less sick since stopping Ocella last cycle. She is currently on Arantacan and Avanafil. The  patient is also taking Eliquis and reports no bleeding. She is scheduled for a scan next week.     All other systems were  reviewed with the patient and are negative.  MEDICAL HISTORY:  Past Medical History:  Diagnosis Date   Acute pulmonary embolism (HCC) 10/29/2023   Allergy    Back pain    Cancer (HCC) 2019   left breast cancer-last radiation Dec.2019   Diabetes mellitus without complication [facility])    Medical history non-contributory    Personal history of radiation therapy    Port-A-Cath in place - removed 12-11-2023 due to port-a-cath infection. 11/13/2023   Prediabetes    Tiredness    Vitamin D deficiency     SURGICAL HISTORY: Past Surgical History:  Procedure Laterality Date   BREAST BIOPSY Left 08/04/2018   x3   BREAST LUMPECTOMY Left    09-29-18   BREAST LUMPECTOMY WITH RADIOACTIVE SEED AND SENTINEL LYMPH NODE BIOPSY Left 09/29/2018   Procedure: LEFT BREAST LUMPECTOMY WITH RADIOACTIVE SEED AND SENTINEL LYMPH NODE BIOPSY;  Surgeon: [name], MD;  Location: [facility];  Service: General;  Laterality: Left;   COLONOSCOPY  2016   IR IMAGING GUIDED PORT INSERTION  11/03/2023   IR PATIENT EVAL TECH 0-60 MINS  12/18/2023   IR PATIENT EVAL TECH 0-60 MINS  12/24/2023   IR PATIENT EVAL TECH 0-60 MINS  12/28/2023   IR PATIENT EVAL TECH 0-60 MINS  01/04/2024   IR PATIENT EVAL TECH 0-60 MINS  01/13/2024   IR PATIENT EVAL TECH 0-60 MINS  01/22/2024   IR RADIOLOGIST EVAL & MGMT  12/15/2023   IR REMOVAL TUN ACCESS W/ PORT W/O FL MOD SED  12/11/2023   POLYPECTOMY     WISDOM TOOTH EXTRACTION      I have reviewed the social history and family history with the patient and they are unchanged from previous note.  ALLERGIES:  is allergic to irinotecan liposome.  MEDICATIONS:  Current Outpatient Medications  Medication Sig Dispense Refill   apixaban (ELIQUIS) 5 MG TABS tablet Take 1 tablet (5 mg total) by mouth 2 (two) times daily. 60 tablet 5   cetirizine (ZYRTEC) 10 MG chewable tablet Chew 10 mg by mouth daily.     gabapentin (NEURONTIN) 100 MG capsule Take 1 capsule (100 mg total) by  mouth 3 (three) times daily. 90 capsule 1   lidocaine-prilocaine (EMLA) cream Apply to affected area once (Patient taking differently: Apply 1 Application topically See admin instructions. Apply to affected area for port access) 30 g 3   ondansetron (ZOFRAN) 8 MG tablet Take 1 tablet (8 mg total) by mouth every 8 (eight) hours as needed for nausea or vomiting. Start on the third day after irinotecan 30 tablet 1   prochlorperazine (COMPAZINE) 10 MG tablet Take 1 tablet (10 mg total) by mouth every 6 (six) hours as needed for nausea or vomiting. 30 tablet 1   No current facility-administered medications for this visit.   Facility-Administered Medications Ordered in Other Visits  Medication Dose Route Frequency Provider Last Rate Last Admin   0.9 %  sodium chloride infusion   Intravenous Continuous [name], MD   Stopped at 04/13/24 1308   fluorouracil (ADRUCIL) 4,500 mg in sodium chloride 0.9 % 60 mL chemo infusion  4,500 mg Intravenous 1 day or 1 dose [name], MD   Infusion Verify at 04/13/24 1319   heparin lock flush 100 unit/mL  500 Units Intracatheter Once PRN [name], MD  sodium chloride flush (NS) 0.9 % injection 10 mL  10 mL Intracatheter PRN Sonja Eureka, MD        PHYSICAL EXAMINATION: ECOG PERFORMANCE STATUS: 1 - Symptomatic but completely ambulatory  Vitals:   04/13/24 0922  BP: 128/78  Pulse: 68  Resp: 20  Temp: 97.6 F (36.4 C)  SpO2: 94%   Wt Readings from Last 3 Encounters:  04/13/24 171 lb 12.8 oz (77.9 kg)  03/30/24 169 lb 3.2 oz (76.7 kg)  03/16/24 171 lb 14.4 oz (78 kg)     GENERAL:alert, no distress and comfortable SKIN: skin color, texture, turgor are normal, no rashes or significant lesions EYES: normal, Conjunctiva are pink and non-injected, sclera clear NECK: supple, thyroid normal size, non-tender, without nodularity LYMPH:  no palpable lymphadenopathy in the cervical, axillary  LUNGS: clear to auscultation and percussion with normal breathing  effort HEART: regular rate & rhythm and no murmurs and no lower extremity edema ABDOMEN:abdomen soft, non-tender and normal bowel sounds Musculoskeletal:no cyanosis of digits and no clubbing  NEURO: alert & oriented x 3 with fluent speech, no focal motor/sensory deficits  Physical Exam    LABORATORY DATA:  I have reviewed the data as listed    Latest Ref Rng & Units 04/13/2024    8:37 AM 03/30/2024    8:58 AM 03/23/2024    2:23 PM  CBC  WBC 4.0 - 10.5 K/uL 6.7  5.5  7.2   Hemoglobin 12.0 - 15.0 g/dL 69.6  29.5  28.4   Hematocrit 36.0 - 46.0 % 36.4  36.8  36.5   Platelets 150 - 400 K/uL 186  161  114         Latest Ref Rng & Units 04/13/2024    8:37 AM 03/30/2024    8:58 AM 03/23/2024    2:23 PM  CMP  Glucose 70 - 99 mg/dL 132  440  102   BUN 8 - 23 mg/dL 9  5  6    Creatinine 0.44 - 1.00 mg/dL 7.25  3.66  4.40   Sodium 135 - 145 mmol/L 141  137  137   Potassium 3.5 - 5.1 mmol/L 3.5  3.6  3.4   Chloride 98 - 111 mmol/L 106  105  103   CO2 22 - 32 mmol/L 29  27  27    Calcium 8.9 - 10.3 mg/dL 9.2  9.0  9.1   Total Protein 6.5 - 8.1 g/dL 6.6  6.8  7.0   Total Bilirubin 0.0 - 1.2 mg/dL 0.3  0.4  0.4   Alkaline Phos 38 - 126 U/L 215  209  220   AST 15 - 41 U/L 19  18  20    ALT 0 - 44 U/L 11  11  15        RADIOGRAPHIC STUDIES: I have personally reviewed the radiological images as listed and agreed with the findings in the report. No results found.    No orders of the defined types were placed in this encounter.  All questions were answered. The patient knows to call the clinic with any problems, questions or concerns. No barriers to learning was detected. The total time spent in the appointment was 25 minutes.     Sonja Belfield, MD 04/13/2024

## 2024-04-13 NOTE — Patient Instructions (Signed)
 [facility] - A DEPT OF [facility]  Discharge Instructions: Thank you for choosing [facility] to provide your oncology and hematology care.   If you have a lab appointment with the Cancer Center, please go directly to the Cancer Center and check in at the registration area.   Wear comfortable clothing and clothing appropriate for easy access to any Portacath or PICC line.   We strive to give you quality time with your provider. You may need to reschedule your appointment if you arrive late (15 or more minutes).  Arriving late affects you and other patients whose appointments are after yours.  Also, if you miss three or more appointments without notifying the office, you may be dismissed from the clinic at the provider's discretion.      For prescription refill requests, have your pharmacy contact our office and allow 72 hours for refills to be completed.    Today you received the following chemotherapy and/or immunotherapy agents irinotecan liposomal, leucovorin      To help prevent nausea and vomiting after your treatment, we encourage you to take your nausea medication as directed.  BELOW ARE SYMPTOMS THAT SHOULD BE REPORTED IMMEDIATELY: *FEVER GREATER THAN 100.4 F (38 C) OR HIGHER *CHILLS OR SWEATING *NAUSEA AND VOMITING THAT IS NOT CONTROLLED WITH YOUR NAUSEA MEDICATION *UNUSUAL SHORTNESS OF BREATH *UNUSUAL BRUISING OR BLEEDING *URINARY PROBLEMS (pain or burning when urinating, or frequent urination) *BOWEL PROBLEMS (unusual diarrhea, constipation, pain near the anus) TENDERNESS IN MOUTH AND THROAT WITH OR WITHOUT PRESENCE OF ULCERS (sore throat, sores in mouth, or a toothache) UNUSUAL RASH, SWELLING OR PAIN  UNUSUAL VAGINAL DISCHARGE OR ITCHING   Items with * indicate a potential emergency and should be followed up as soon as possible or go to the Emergency Department if any problems should occur.  Please show the CHEMOTHERAPY ALERT  CARD or IMMUNOTHERAPY ALERT CARD at check-in to the Emergency Department and triage nurse.  Should you have questions after your visit or need to cancel or reschedule your appointment, please contact [facility] - A DEPT OF [facility]  Dept: [phone]  and follow the prompts.  Office hours are 8:00 a.m. to 4:30 p.m. Monday - Friday. Please note that voicemails left after 4:00 p.m. may not be returned until the following business day.  We are closed weekends and major holidays. You have access to a nurse at all times for urgent questions. Please call the main number to the clinic Dept: [phone] and follow the prompts.   For any non-urgent questions, you may also contact your provider using MyChart. We now offer e-Visits for anyone 34 and older to request care online for non-urgent symptoms. For details visit [email or web address].   Also download the MyChart app! Go to the app store, search "MyChart", open the app, select , and log in with your MyChart username and password.

## 2024-04-14 LAB — CANCER ANTIGEN 19-9: CA 19-9: 734 U/mL — ABNORMAL HIGH (ref 0–35)

## 2024-04-15 ENCOUNTER — Encounter

## 2024-04-15 ENCOUNTER — Inpatient Hospital Stay

## 2024-04-15 VITALS — BP 120/57 | HR 64 | Temp 98.6°F | Resp 18

## 2024-04-15 DIAGNOSIS — C251 Malignant neoplasm of body of pancreas: Secondary | ICD-10-CM

## 2024-04-15 DIAGNOSIS — Z5111 Encounter for antineoplastic chemotherapy: Secondary | ICD-10-CM | POA: Diagnosis not present

## 2024-04-15 DIAGNOSIS — C787 Secondary malignant neoplasm of liver and intrahepatic bile duct: Secondary | ICD-10-CM | POA: Diagnosis not present

## 2024-04-15 DIAGNOSIS — Z5189 Encounter for other specified aftercare: Secondary | ICD-10-CM | POA: Diagnosis not present

## 2024-04-15 DIAGNOSIS — Z170 Estrogen receptor positive status [ER+]: Secondary | ICD-10-CM

## 2024-04-15 MED ORDER — SODIUM CHLORIDE 0.9% FLUSH
10.0000 mL | Freq: Once | INTRAVENOUS | Status: AC
  Administered 2024-04-15: 10 mL

## 2024-04-15 MED ORDER — PEGFILGRASTIM INJECTION 6 MG/0.6ML ~~LOC~~
6.0000 mg | PREFILLED_SYRINGE | Freq: Once | SUBCUTANEOUS | Status: AC
  Administered 2024-04-15: 6 mg via SUBCUTANEOUS
  Filled 2024-04-15: qty 0.6

## 2024-04-15 MED ORDER — HEPARIN SOD (PORK) LOCK FLUSH 100 UNIT/ML IV SOLN
500.0000 [IU] | Freq: Once | INTRAVENOUS | Status: AC
  Administered 2024-04-15: 500 [IU]

## 2024-04-18 ENCOUNTER — Encounter: Payer: Self-pay | Admitting: Hematology

## 2024-04-20 ENCOUNTER — Inpatient Hospital Stay

## 2024-04-20 ENCOUNTER — Ambulatory Visit (HOSPITAL_COMMUNITY)
Admission: RE | Admit: 2024-04-20 | Discharge: 2024-04-20 | Disposition: A | Discharge status: Home / Self Care | Source: Ambulatory Visit | Attending: Hematology | Admitting: Hematology

## 2024-04-20 DIAGNOSIS — K8020 Calculus of gallbladder without cholecystitis without obstruction: Secondary | ICD-10-CM | POA: Diagnosis not present

## 2024-04-20 DIAGNOSIS — K760 Fatty (change of) liver, not elsewhere classified: Secondary | ICD-10-CM | POA: Diagnosis not present

## 2024-04-20 DIAGNOSIS — C251 Malignant neoplasm of body of pancreas: Secondary | ICD-10-CM | POA: Diagnosis not present

## 2024-04-20 DIAGNOSIS — C50812 Malignant neoplasm of overlapping sites of left female breast: Secondary | ICD-10-CM

## 2024-04-20 DIAGNOSIS — C259 Malignant neoplasm of pancreas, unspecified: Secondary | ICD-10-CM | POA: Diagnosis not present

## 2024-04-20 MED ORDER — HEPARIN SOD (PORK) LOCK FLUSH 100 UNIT/ML IV SOLN
500.0000 [IU] | Freq: Once | INTRAVENOUS | Status: AC
  Administered 2024-04-20: 500 [IU]

## 2024-04-20 MED ORDER — SODIUM CHLORIDE 0.9% FLUSH
10.0000 mL | Freq: Once | INTRAVENOUS | Status: AC
  Administered 2024-04-20: 10 mL

## 2024-04-20 MED ORDER — SODIUM CHLORIDE (PF) 0.9 % IJ SOLN
INTRAMUSCULAR | Status: AC
  Filled 2024-04-20: qty 50

## 2024-04-20 MED ORDER — IOHEXOL 350 MG/ML SOLN
100.0000 mL | Freq: Once | INTRAVENOUS | Status: AC | PRN
  Administered 2024-04-20: 100 mL via INTRAVENOUS

## 2024-04-25 ENCOUNTER — Other Ambulatory Visit (HOSPITAL_COMMUNITY): Payer: Self-pay

## 2024-04-25 ENCOUNTER — Other Ambulatory Visit: Payer: Self-pay

## 2024-04-26 ENCOUNTER — Telehealth: Payer: Self-pay

## 2024-04-26 NOTE — Telephone Encounter (Signed)
 Called the pt and was able to leave VM regarding her completed FMLA forms and Cancer policy form. Pt will be in tomorrow for an appointment, she will pick up her hard copy 04/27/2024. No questions or concerns at this time.

## 2024-04-26 NOTE — Assessment & Plan Note (Signed)
-  stage IV with liver mets, MSS, KRAS G12R mutation (+) -diagnosed in 10/2023 -Patient has a history of breast cancer. She presents with dyspnea, chest discomfort, decreased appetite, and weight loss. She also reports new onset back pain. -CT showed a 2.9cm mass in pancreatic neck/body, and multiple liver mets, liver biopsy confirmed adenocarcinoma  -I reviewed the aggressive nature of pancreatic cancer, and the incurable nature of her disease due to diffuse liver metastasis. -she start first line chemo Nalirifox chemotherapy regimen on 11/12, with dose reduction for first cycle due to poor PS -Performed molecular testing on tumor tissue to identify potential targeted therapy options, unfortunately no actionable mutations detected  -She underwent genetic testing and came back negative  -restaging CT 04/20/2024 showed mild improvement, will continue current therapy

## 2024-04-27 ENCOUNTER — Other Ambulatory Visit (HOSPITAL_COMMUNITY): Payer: Self-pay

## 2024-04-27 ENCOUNTER — Encounter: Payer: Self-pay | Admitting: Hematology

## 2024-04-27 ENCOUNTER — Inpatient Hospital Stay

## 2024-04-27 ENCOUNTER — Other Ambulatory Visit: Payer: Self-pay

## 2024-04-27 ENCOUNTER — Inpatient Hospital Stay (HOSPITAL_BASED_OUTPATIENT_CLINIC_OR_DEPARTMENT_OTHER): Admitting: Hematology

## 2024-04-27 VITALS — BP 117/76 | HR 67 | Temp 97.9°F | Resp 17 | Ht 61.5 in | Wt 166.1 lb

## 2024-04-27 DIAGNOSIS — Z5189 Encounter for other specified aftercare: Secondary | ICD-10-CM | POA: Diagnosis not present

## 2024-04-27 DIAGNOSIS — Z170 Estrogen receptor positive status [ER+]: Secondary | ICD-10-CM

## 2024-04-27 DIAGNOSIS — Z5111 Encounter for antineoplastic chemotherapy: Secondary | ICD-10-CM | POA: Diagnosis not present

## 2024-04-27 DIAGNOSIS — C787 Secondary malignant neoplasm of liver and intrahepatic bile duct: Secondary | ICD-10-CM | POA: Diagnosis not present

## 2024-04-27 DIAGNOSIS — C251 Malignant neoplasm of body of pancreas: Secondary | ICD-10-CM

## 2024-04-27 DIAGNOSIS — C259 Malignant neoplasm of pancreas, unspecified: Secondary | ICD-10-CM | POA: Diagnosis not present

## 2024-04-27 LAB — CBC WITH DIFFERENTIAL (CANCER CENTER ONLY)
Abs Immature Granulocytes: 0.07 10*3/uL (ref 0.00–0.07)
Basophils Absolute: 0 10*3/uL (ref 0.0–0.1)
Basophils Relative: 0 %
Eosinophils Absolute: 0.1 10*3/uL (ref 0.0–0.5)
Eosinophils Relative: 1 %
HCT: 36.5 % (ref 36.0–46.0)
Hemoglobin: 12.3 g/dL (ref 12.0–15.0)
Immature Granulocytes: 1 %
Lymphocytes Relative: 20 %
Lymphs Abs: 1.1 10*3/uL (ref 0.7–4.0)
MCH: 28.2 pg (ref 26.0–34.0)
MCHC: 33.7 g/dL (ref 30.0–36.0)
MCV: 83.7 fL (ref 80.0–100.0)
Monocytes Absolute: 0.6 10*3/uL (ref 0.1–1.0)
Monocytes Relative: 10 %
Neutro Abs: 4 10*3/uL (ref 1.7–7.7)
Neutrophils Relative %: 68 %
Platelet Count: 201 10*3/uL (ref 150–400)
RBC: 4.36 MIL/uL (ref 3.87–5.11)
RDW: 16 % — ABNORMAL HIGH (ref 11.5–15.5)
WBC Count: 5.9 10*3/uL (ref 4.0–10.5)
nRBC: 0 % (ref 0.0–0.2)

## 2024-04-27 LAB — CMP (CANCER CENTER ONLY)
ALT: 11 U/L (ref 0–44)
AST: 17 U/L (ref 15–41)
Albumin: 3.8 g/dL (ref 3.5–5.0)
Alkaline Phosphatase: 214 U/L — ABNORMAL HIGH (ref 38–126)
Anion gap: 7 (ref 5–15)
BUN: <5 mg/dL — ABNORMAL LOW (ref 8–23)
CO2: 27 mmol/L (ref 22–32)
Calcium: 8.9 mg/dL (ref 8.9–10.3)
Chloride: 105 mmol/L (ref 98–111)
Creatinine: 0.52 mg/dL (ref 0.44–1.00)
GFR, Estimated: >60 mL/min (ref >60)
Glucose, Bld: 144 mg/dL — ABNORMAL HIGH (ref 70–99)
Potassium: 3.2 mmol/L — ABNORMAL LOW (ref 3.5–5.1)
Sodium: 139 mmol/L (ref 135–145)
Total Bilirubin: 0.3 mg/dL (ref 0.0–1.2)
Total Protein: 6.6 g/dL (ref 6.5–8.1)

## 2024-04-27 MED ORDER — DEXAMETHASONE SODIUM PHOSPHATE 10 MG/ML IJ SOLN
10.0000 mg | Freq: Once | INTRAMUSCULAR | Status: AC
  Administered 2024-04-27: 10 mg via INTRAVENOUS
  Filled 2024-04-27: qty 1

## 2024-04-27 MED ORDER — LEUCOVORIN CALCIUM INJECTION 350 MG
400.0000 mg/m2 | Freq: Once | INTRAVENOUS | Status: AC
  Administered 2024-04-27: 788 mg via INTRAVENOUS
  Filled 2024-04-27: qty 25

## 2024-04-27 MED ORDER — SODIUM CHLORIDE 0.9 % IV SOLN: INTRAVENOUS | Status: DC

## 2024-04-27 MED ORDER — SODIUM CHLORIDE 0.9% FLUSH: 10.0000 mL | Freq: Once | INTRAVENOUS | Status: DC

## 2024-04-27 MED ORDER — FAMOTIDINE IN NACL 20-0.9 MG/50ML-% IV SOLN
20.0000 mg | Freq: Once | INTRAVENOUS | Status: AC
  Administered 2024-04-27: 20 mg via INTRAVENOUS
  Filled 2024-04-27: qty 50

## 2024-04-27 MED ORDER — PALONOSETRON HCL INJECTION 0.25 MG/5ML
0.2500 mg | Freq: Once | INTRAVENOUS | Status: AC
  Administered 2024-04-27: 0.25 mg via INTRAVENOUS
  Filled 2024-04-27: qty 5

## 2024-04-27 MED ORDER — SODIUM CHLORIDE 0.9% FLUSH
10.0000 mL | INTRAVENOUS | Status: DC | PRN
Start: 2024-04-27 — End: 2024-04-27

## 2024-04-27 MED ORDER — SODIUM CHLORIDE 0.9% FLUSH
10.0000 mL | Freq: Once | INTRAVENOUS | Status: AC
  Administered 2024-04-27: 10 mL

## 2024-04-27 MED ORDER — ATROPINE SULFATE 1 MG/ML IV SOLN
0.4000 mg | Freq: Once | INTRAVENOUS | Status: AC
  Administered 2024-04-27: 0.4 mg via INTRAVENOUS
  Filled 2024-04-27: qty 1

## 2024-04-27 MED ORDER — SODIUM CHLORIDE 0.9 % IV SOLN
70.0000 mg/m2 | Freq: Once | INTRAVENOUS | Status: AC
  Administered 2024-04-27: 137.6 mg via INTRAVENOUS
  Filled 2024-04-27: qty 32

## 2024-04-27 MED ORDER — HEPARIN SOD (PORK) LOCK FLUSH 100 UNIT/ML IV SOLN
500.0000 [IU] | Freq: Once | INTRAVENOUS | Status: AC
Start: 2024-04-27 — End: 2024-04-27
  Administered 2024-04-27: 500 [IU]

## 2024-04-27 MED ORDER — POTASSIUM CHLORIDE CRYS ER 10 MEQ PO TBCR
10.0000 meq | EXTENDED_RELEASE_TABLET | Freq: Two times a day (BID) | ORAL | 1 refills | Status: DC
  Filled 2024-04-27: qty 60, 30d supply, fill #0

## 2024-04-27 MED ORDER — HEPARIN SOD (PORK) LOCK FLUSH 100 UNIT/ML IV SOLN: 250.0000 [IU] | Freq: Once | INTRAVENOUS | Status: DC | PRN

## 2024-04-27 MED ORDER — SODIUM CHLORIDE 0.9 % IV SOLN
4500.0000 mg | INTRAVENOUS | Status: DC
  Administered 2024-04-27: 4500 mg via INTRAVENOUS
  Filled 2024-04-27: qty 90

## 2024-04-27 NOTE — Patient Instructions (Signed)
 [facility] - A DEPT OF [facility]  Discharge Instructions: Thank you for choosing [facility] to provide your oncology and hematology care.   If you have a lab appointment with the Cancer Center, please go directly to the Cancer Center and check in at the registration area.   Wear comfortable clothing and clothing appropriate for easy access to any Portacath or PICC line.   We strive to give you quality time with your provider. You may need to reschedule your appointment if you arrive late (15 or more minutes).  Arriving late affects you and other patients whose appointments are after yours.  Also, if you miss three or more appointments without notifying the office, you may be dismissed from the clinic at the provider's discretion.      For prescription refill requests, have your pharmacy contact our office and allow 72 hours for refills to be completed.    Today you received the following chemotherapy and/or immunotherapy agents: irinotecan  liposomal and fluorouracil       To help prevent nausea and vomiting after your treatment, we encourage you to take your nausea medication as directed.  BELOW ARE SYMPTOMS THAT SHOULD BE REPORTED IMMEDIATELY: *FEVER GREATER THAN 100.4 F (38 C) OR HIGHER *CHILLS OR SWEATING *NAUSEA AND VOMITING THAT IS NOT CONTROLLED WITH YOUR NAUSEA MEDICATION *UNUSUAL SHORTNESS OF BREATH *UNUSUAL BRUISING OR BLEEDING *URINARY PROBLEMS (pain or burning when urinating, or frequent urination) *BOWEL PROBLEMS (unusual diarrhea, constipation, pain near the anus) TENDERNESS IN MOUTH AND THROAT WITH OR WITHOUT PRESENCE OF ULCERS (sore throat, sores in mouth, or a toothache) UNUSUAL RASH, SWELLING OR PAIN  UNUSUAL VAGINAL DISCHARGE OR ITCHING   Items with * indicate a potential emergency and should be followed up as soon as possible or go to the Emergency Department if any problems should occur.  Please show the CHEMOTHERAPY  ALERT CARD or IMMUNOTHERAPY ALERT CARD at check-in to the Emergency Department and triage nurse.  Should you have questions after your visit or need to cancel or reschedule your appointment, please contact [facility] - A DEPT OF [facility]  Dept: [phone]  and follow the prompts.  Office hours are 8:00 a.m. to 4:30 p.m. Monday - Friday. Please note that voicemails left after 4:00 p.m. may not be returned until the following business day.  We are closed weekends and major holidays. You have access to a nurse at all times for urgent questions. Please call the main number to the clinic Dept: [phone] and follow the prompts.   For any non-urgent questions, you may also contact your provider using MyChart. We now offer e-Visits for anyone 32 and older to request care online for non-urgent symptoms. For details visit [email or web address].   Also download the MyChart app! Go to the app store, search "MyChart", open the app, select [place], and log in with your MyChart username and password.

## 2024-04-27 NOTE — Progress Notes (Signed)
 [name]   [phone] Fax:([phone]   Clinic Follow up Note   Patient Care Team: [name], [name] as PCP - General (Nurse Practitioner) [name], MD as Consulting Physician (Hematology and Oncology) [name], MD as Consulting Physician (Radiation Oncology) [name], MD as Consulting Physician (General Surgery) [name], MD as Consulting Physician (Hematology and Oncology)  Date of Service:  04/27/2024  CHIEF COMPLAINT: f/u of pancreatic cancer  CURRENT THERAPY:  Chemotherapy liposomal irinotecan  and 5-FU every 2 weeks  Oncology History   Pancreatic cancer (HCC) -stage IV with liver mets, MSS, KRAS G12R mutation (+) -diagnosed in 10/2023 -Patient has a history of breast cancer. She presents with dyspnea, chest discomfort, decreased appetite, and weight loss. She also reports new onset back pain. -CT showed a 2.9cm mass in pancreatic neck/body, and multiple liver mets, liver biopsy confirmed adenocarcinoma  -I reviewed the aggressive nature of pancreatic cancer, and the incurable nature of her disease due to diffuse liver metastasis. -she start first line chemo Nalirifox chemotherapy regimen on 11/12, with dose reduction for first cycle due to poor PS -Performed molecular testing on tumor tissue to identify potential targeted therapy options, unfortunately no actionable mutations detected  -She underwent genetic testing and came back negative  -restaging CT 04/20/2024 showed mild improvement, will continue current therapy   Assessment & Plan Pancreatic cancer with liver metastasis Pancreatic cancer with liver metastasis, currently on maintenance therapy with pump and liposomal irinotecan . Recent CT scan shows slight improvement in the pancreatic mass and reduction in liver lesions. Liver function is well-managed, though there are changes concerning for pseudothoracic effects from chemotherapy. The condition is not curable but  treatable, with the goal of controlling the disease and prolonging life. She is on first-line treatment and has shown a good response so far. The prognosis is palliative, aiming to maintain disease control and extend life expectancy. Future treatment adjustments will be considered if disease progression occurs. - Continue current chemotherapy regimen with pump and liposomal irinotecan . - Monitor liver function and CT scans every three months to assess disease stability. - Discuss prognosis and treatment goals, emphasizing the palliative nature of care.  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 Symptoms persist despite discontinuation of oxaliplatin . Gabapentin  was previously ineffective and did not cause drowsiness. She will attempt a higher dosage of gabapentin  before considering alternative medications. - Increase gabapentin  dosage gradually, starting with two tablets in the morning, two in the afternoon, and three in the evening. - Consider Cymbalta if increased gabapentin  dosage is ineffective.  Fatigue Fatigue likely related to chemotherapy and poor sleep quality. She reports taking naps during the day due to poor nighttime sleep. - Encourage maintaining a regular sleep schedule and consider sleep hygiene measures.  Hypokalemia Hypokalemia with low potassium levels. She reports poor appetite and weight loss, which may contribute to electrolyte imbalance. - Prescribe potassium chloride  10 mEq twice daily. - Reassess potassium levels at the next visit and adjust dosage if levels normalize.  Constipation Constipation managed with Miralax every two days. No diarrhea reported. - Continue Miralax every two days as needed.  Goals of Care Discussion about the incurable nature of her cancer and the importance of advanced directives. She understands the prognosis and the palliative  intent of her treatment. She has not yet completed a living will but plans to discuss her wishes with her family. The recommendation is for a DNR status, and she is  encouraged to communicate her preferences to her family to avoid difficult decisions during acute illness. - Consider completing a living will and discussing advanced directives with family. - Document any decisions regarding code status and life-sustaining treatments in the medical record.  Plan - Lab reviewed, adequate for treatment, will proceed to chemo today and continue every 2 weeks - I personally reviewed her restaging CT scan which showed partial response - We discussed living will and CODE STATUS today, she will think about DNR. - Follow-up in 2 weeks   SUMMARY OF ONCOLOGIC HISTORY: Oncology History  Malignant neoplasm of overlapping sites of left breast in female, estrogen receptor positive (HCC)  08/04/2018 Initial Diagnosis   Screening mammogram detected left breast mass at 9:30 position 1.2 x 1.2 x 1.1 cm.  Closer to the nipple at 9:30 position 0.8 cm lesion span 3.6 cm and a 1.5 cm apart.  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 position 2 cm from nipple, at 1 cm from nipple PASH, lymph node benign, ER 90%, PR 95%, Ki-67 20%, HER-2 negative by IHC 1+, T1CN0 stage Ia AJCC 8   09/29/2018 Surgery   Left lumpectomy: Grade 3 IDC, 1.7 cm, margins negative, lymphovascular invasion present, 0/2 lymph nodes negative, T1c N0 stage Ia   10/06/2018 Cancer Staging   Staging form: Breast, AJCC 8th Edition - Pathologic: Stage IA (pT1c, pN0(sn), cM0, G3, ER+, PR+, HER2-) - Signed by [name], MD on 10/06/2018   10/22/2018 Oncotype testing   Oncotype score 13: Distant recurrence at 9 years with hormone therapy alone 4%   11/12/2018 - 12/27/2018 Radiation Therapy   Adjuvant radiation therapy   12/27/2018 -  Anti-estrogen oral therapy   Antiestrogen  therapy with letrozole  2.5 mg daily   11/30/2023 Genetic Testing   Negative genetic testing on the CancerNext-Expanded+RNAinsight panel.  VUS identified in KIT c.2083G>C and PMS2 c.-1C>A.  The report date is 11/30/2023.  The CancerNext-Expanded gene panel offered by [facility] and includes sequencing, rearrangement, and RNA analysis for the following 76 genes: AIP, ALK, APC, ATM, AXIN2, BAP1, BARD1, BMPR1A, BRCA1, BRCA2, BRIP1, CDC73, CDH1, CDK4, CDKN1B, CDKN2A, CEBPA, CHEK2, CTNNA1, DDX41, DICER1, ETV6, FH, FLCN, GATA2, LZTR1, MAX, MBD4, MEN1, MET, MLH1, MSH2, MSH3, MSH6, MUTYH, NF1, NF2, NTHL1, PALB2, PHOX2B, PMS2, POT1, PRKAR1A, PTCH1, PTEN, RAD51C, RAD51D, RB1, RET, RUNX1, SDHA, SDHAF2, SDHB, SDHC, SDHD, SMAD4, SMARCA4, SMARCB1, SMARCE1, STK11, SUFU, TMEM127, TP53, TSC1, TSC2, VHL, and WT1 (sequencing and deletion/duplication); EGFR, HOXB13, KIT, MITF, PDGFRA, POLD1, and POLE (sequencing only); EPCAM and GREM1 (deletion/duplication only).    Pancreatic cancer (HCC)  11/03/2023 Initial Diagnosis   Pancreatic cancer (HCC)   11/11/2023 -  Chemotherapy   Patient is on Treatment Plan : PANCREAS NALIRIFOX D1, 15 Q28D     11/30/2023 Genetic Testing   Negative genetic testing on the CancerNext-Expanded+RNAinsight panel.  VUS identified in KIT c.2083G>C and PMS2 c.-1C>A.  The report date is 11/30/2023.  The CancerNext-Expanded gene panel offered by [facility] and includes sequencing, rearrangement, and RNA analysis for the following 76 genes: AIP, ALK, APC, ATM, AXIN2, BAP1, BARD1, BMPR1A, BRCA1, BRCA2, BRIP1, CDC73, CDH1, CDK4, CDKN1B, CDKN2A, CEBPA, CHEK2, CTNNA1, DDX41, DICER1, ETV6, FH, FLCN, GATA2, LZTR1, MAX, MBD4, MEN1, MET, MLH1, MSH2, MSH3, MSH6, MUTYH, NF1, NF2, NTHL1, PALB2, PHOX2B, PMS2, POT1, PRKAR1A, PTCH1, PTEN, RAD51C, RAD51D, RB1, RET, RUNX1, SDHA, SDHAF2, SDHB, SDHC, SDHD, SMAD4, SMARCA4, SMARCB1, SMARCE1, STK11, SUFU, TMEM127, TP53,  TSC1, TSC2, VHL, and WT1 (sequencing and  deletion/duplication); EGFR, HOXB13, KIT, MITF, PDGFRA, POLD1, and POLE (sequencing only); EPCAM and GREM1 (deletion/duplication only).    01/29/2024 Imaging   CT abdomen and pelvis with contrast  IMPRESSION: 1. Diminished size of a hypodense mass of the pancreatic neck, consistent with treatment response. 2. Diminished size and hypodensity of numerous liver metastases, with new overlying pseudocirrhotic capsular retraction, consistent with treatment response. 3. 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 4. Unchanged prominent, nonspecific retroperitoneal lymph nodes.  5. Underlying hepatic steatosis.      Discussed the use of AI scribe software for clinical note transcription with the patient, who gave verbal consent to proceed.  History of Present Illness Ashley Clark is a 63 year old female with pancreatic cancer who presents for follow-up.  She experiences fatigue and neuropathy with numbness and tingling, rated at 7 out of 10, following chemotherapy. Despite discontinuing oxaliplatin  in March, neuropathy has worsened on some days. She manages constipation with Miralax every two days. Her appetite is poor, resulting in a 10-pound weight loss from 177 pounds. She takes potassium supplements for low potassium levels and has increased her gabapentin  dosage for neuropathy. She has also started B12 supplements.  She is not driving and is on long-term disability. She maintains her daily routine with some limitations due to a PICC line and engages in social activities when feeling well. Her sleep is disrupted, and she takes two-hour naps during the day.     All other systems were reviewed with the patient and are negative.  MEDICAL HISTORY:  Past Medical History:  Diagnosis Date   Acute pulmonary embolism (HCC) 10/29/2023   Allergy    Back pain    Cancer (HCC) 2019   left breast cancer-last radiation Dec.2019   Diabetes mellitus without complication [facility])     Medical history non-contributory    Personal history of radiation therapy    Port-A-Cath in place - removed 12-11-2023 due to port-a-cath infection. 11/13/2023   Prediabetes    Tiredness    Vitamin D  deficiency     SURGICAL HISTORY: Past Surgical History:  Procedure Laterality Date   BREAST BIOPSY Left 08/04/2018   x3   BREAST LUMPECTOMY Left    09-29-18   BREAST LUMPECTOMY WITH RADIOACTIVE SEED AND SENTINEL LYMPH NODE BIOPSY Left 09/29/2018   Procedure: LEFT BREAST LUMPECTOMY WITH RADIOACTIVE SEED AND SENTINEL LYMPH NODE BIOPSY;  Surgeon: [name], MD;  Location: [facility];  Service: General;  Laterality: Left;   COLONOSCOPY  2016   IR IMAGING GUIDED PORT INSERTION  11/03/2023   IR PATIENT EVAL TECH 0-60 MINS  12/18/2023   IR PATIENT EVAL TECH 0-60 MINS  12/24/2023   IR PATIENT EVAL TECH 0-60 MINS  12/28/2023   IR PATIENT EVAL TECH 0-60 MINS  01/04/2024   IR PATIENT EVAL TECH 0-60 MINS  01/13/2024   IR PATIENT EVAL TECH 0-60 MINS  01/22/2024   IR RADIOLOGIST EVAL & MGMT  12/15/2023   IR REMOVAL TUN ACCESS W/ PORT W/O FL MOD SED  12/11/2023   POLYPECTOMY     WISDOM TOOTH EXTRACTION      I have reviewed the social history and family history with the patient and they are unchanged from previous note.  ALLERGIES:  is allergic to irinotecan  liposome.  MEDICATIONS:  Current Outpatient Medications  Medication Sig Dispense Refill   potassium chloride  (KLOR-CON  M) 10 MEQ tablet Take 1 tablet (10 mEq total) by  mouth 2 (two) times daily. 60 tablet 1   apixaban  (ELIQUIS ) 5 MG TABS tablet Take 1 tablet (5 mg total) by mouth 2 (two) times daily. 60 tablet 5   cetirizine (ZYRTEC) 10 MG chewable tablet Chew 10 mg by mouth daily.     lidocaine -prilocaine  (EMLA ) cream Apply to affected area once (Patient taking differently: Apply 1 Application topically See admin instructions. Apply to affected area for port access) 30 g 3   ondansetron  (ZOFRAN ) 8 MG tablet Take 1 tablet  (8 mg total) by mouth every 8 (eight) hours as needed for nausea or vomiting. Start on the third day after irinotecan  30 tablet 1   prochlorperazine  (COMPAZINE ) 10 MG tablet Take 1 tablet (10 mg total) by mouth every 6 (six) hours as needed for nausea or vomiting. 30 tablet 1   No current facility-administered medications for this visit.   Facility-Administered Medications Ordered in Other Visits  Medication Dose Route Frequency Provider Last Rate Last Admin   0.9 %  sodium chloride  infusion   Intravenous Continuous Sonja [place], MD 10 mL/hr at 04/27/24 1230 New Bag at 04/27/24 1230   fluorouracil  (ADRUCIL ) 4,500 mg in sodium chloride  0.9 % 60 mL chemo infusion  4,500 mg Intravenous 1 day or 1 dose Sonja [place], MD   4,500 mg at 04/27/24 1551   heparin  lock flush 100 unit/mL  500 Units Intracatheter Once PRN Sonja [place], MD       heparin  lock flush 100 unit/mL  250 Units Intracatheter Once PRN Sonja [place], MD       sodium chloride  flush (NS) 0.9 % injection 10 mL  10 mL Intracatheter PRN Sonja [place], MD       sodium chloride  flush (NS) 0.9 % injection 10 mL  10 mL Intracatheter PRN Sonja [place], MD       sodium chloride  flush (NS) 0.9 % injection 10 mL  10 mL Intravenous Once Sonja [place], MD        PHYSICAL EXAMINATION: ECOG PERFORMANCE STATUS: 1  Vitals:   04/27/24 1152  BP: 117/76  Pulse: 67  Resp: 17  Temp: 97.9 F (36.6 C)  SpO2: 98%   Wt Readings from Last 3 Encounters:  04/27/24 166 lb 1 oz (75.3 kg)  04/13/24 171 lb 12.8 oz (77.9 kg)  03/30/24 169 lb 3.2 oz (76.7 kg)     GENERAL:alert, no distress and comfortable SKIN: skin color, texture, turgor are normal, no rashes or significant lesions EYES: normal, Conjunctiva are pink and non-injected, sclera clear NECK: supple, thyroid normal size, non-tender, without nodularity LYMPH:  no palpable lymphadenopathy in the cervical, axillary  LUNGS: clear to auscultation and percussion with normal breathing effort HEART: regular rate & rhythm and  no murmurs and no lower extremity edema ABDOMEN:abdomen soft, non-tender and normal bowel sounds Musculoskeletal:no cyanosis of digits and no clubbing  NEURO: alert & oriented x 3 with fluent speech, no focal motor/sensory deficits  Physical Exam    LABORATORY DATA:  I have reviewed the data as listed    Latest Ref Rng & Units 04/27/2024   10:59 AM 04/13/2024    8:37 AM 03/30/2024    8:58 AM  CBC  WBC 4.0 - 10.5 K/uL 5.9  6.7  5.5   Hemoglobin 12.0 - 15.0 g/dL 40.9  81.1  91.4   Hematocrit 36.0 - 46.0 % 36.5  36.4  36.8   Platelets 150 - 400 K/uL 201  186  161         Latest  Ref Rng & Units 04/27/2024   10:59 AM 04/13/2024    8:37 AM 03/30/2024    8:58 AM  CMP  Glucose 70 - 99 mg/dL 366  440  347   BUN 8 - 23 mg/dL 5  9  5    Creatinine 0.44 - 1.00 mg/dL 4.25  9.56  3.87   Sodium 135 - 145 mmol/L 139  141  137   Potassium 3.5 - 5.1 mmol/L 3.2  3.5  3.6   Chloride 98 - 111 mmol/L 105  106  105   CO2 22 - 32 mmol/L 27  29  27    Calcium  8.9 - 10.3 mg/dL 8.9  9.2  9.0   Total Protein 6.5 - 8.1 g/dL 6.6  6.6  6.8   Total Bilirubin 0.0 - 1.2 mg/dL 0.3  0.3  0.4   Alkaline Phos 38 - 126 U/L 214  215  209   AST 15 - 41 U/L 17  19  18    ALT 0 - 44 U/L 11  11  11        RADIOGRAPHIC STUDIES: I have personally reviewed the radiological images as listed and agreed with the findings in the report. No results found.    No orders of the defined types were placed in this encounter.  All questions were answered. The patient knows to call the clinic with any problems, questions or concerns. No barriers to learning was detected. The total time spent in the appointment was 40 minutes.     Sonja [place]az[place], MD 04/27/2024

## 2024-04-29 ENCOUNTER — Encounter

## 2024-04-29 ENCOUNTER — Inpatient Hospital Stay: Attending: Hematology

## 2024-04-29 VITALS — BP 113/81 | HR 66 | Temp 97.7°F | Resp 17

## 2024-04-29 DIAGNOSIS — C787 Secondary malignant neoplasm of liver and intrahepatic bile duct: Secondary | ICD-10-CM | POA: Diagnosis not present

## 2024-04-29 DIAGNOSIS — Z79811 Long term (current) use of aromatase inhibitors: Secondary | ICD-10-CM | POA: Diagnosis not present

## 2024-04-29 DIAGNOSIS — Z170 Estrogen receptor positive status [ER+]: Secondary | ICD-10-CM | POA: Insufficient documentation

## 2024-04-29 DIAGNOSIS — C251 Malignant neoplasm of body of pancreas: Secondary | ICD-10-CM | POA: Insufficient documentation

## 2024-04-29 DIAGNOSIS — Z5189 Encounter for other specified aftercare: Secondary | ICD-10-CM | POA: Insufficient documentation

## 2024-04-29 DIAGNOSIS — Z5111 Encounter for antineoplastic chemotherapy: Secondary | ICD-10-CM | POA: Insufficient documentation

## 2024-04-29 DIAGNOSIS — C50812 Malignant neoplasm of overlapping sites of left female breast: Secondary | ICD-10-CM | POA: Insufficient documentation

## 2024-04-29 MED ORDER — HEPARIN SOD (PORK) LOCK FLUSH 100 UNIT/ML IV SOLN
500.0000 [IU] | Freq: Once | INTRAVENOUS | Status: AC
Start: 2024-04-29 — End: 2024-04-29
  Administered 2024-04-29: 500 [IU]

## 2024-04-29 MED ORDER — SODIUM CHLORIDE 0.9% FLUSH
10.0000 mL | Freq: Once | INTRAVENOUS | Status: AC
  Administered 2024-04-29: 10 mL

## 2024-04-29 MED ORDER — PEGFILGRASTIM INJECTION 6 MG/0.6ML ~~LOC~~
6.0000 mg | PREFILLED_SYRINGE | Freq: Once | SUBCUTANEOUS | Status: AC
  Administered 2024-04-29: 6 mg via SUBCUTANEOUS
  Filled 2024-04-29: qty 0.6

## 2024-05-04 ENCOUNTER — Other Ambulatory Visit: Payer: Self-pay

## 2024-05-04 ENCOUNTER — Inpatient Hospital Stay

## 2024-05-04 DIAGNOSIS — C251 Malignant neoplasm of body of pancreas: Secondary | ICD-10-CM | POA: Diagnosis not present

## 2024-05-04 DIAGNOSIS — Z79811 Long term (current) use of aromatase inhibitors: Secondary | ICD-10-CM | POA: Diagnosis not present

## 2024-05-04 DIAGNOSIS — C50812 Malignant neoplasm of overlapping sites of left female breast: Secondary | ICD-10-CM

## 2024-05-04 DIAGNOSIS — C259 Malignant neoplasm of pancreas, unspecified: Secondary | ICD-10-CM

## 2024-05-04 DIAGNOSIS — Z5189 Encounter for other specified aftercare: Secondary | ICD-10-CM | POA: Diagnosis not present

## 2024-05-04 DIAGNOSIS — C787 Secondary malignant neoplasm of liver and intrahepatic bile duct: Secondary | ICD-10-CM | POA: Diagnosis not present

## 2024-05-04 DIAGNOSIS — Z170 Estrogen receptor positive status [ER+]: Secondary | ICD-10-CM | POA: Diagnosis not present

## 2024-05-04 DIAGNOSIS — Z5111 Encounter for antineoplastic chemotherapy: Secondary | ICD-10-CM | POA: Diagnosis not present

## 2024-05-04 MED ORDER — HEPARIN SOD (PORK) LOCK FLUSH 100 UNIT/ML IV SOLN
500.0000 [IU] | Freq: Once | INTRAVENOUS | Status: AC
  Administered 2024-05-04: 500 [IU]

## 2024-05-04 MED ORDER — SODIUM CHLORIDE 0.9% FLUSH
10.0000 mL | Freq: Once | INTRAVENOUS | Status: AC
Start: 2024-05-04 — End: 2024-05-04
  Administered 2024-05-04: 10 mL

## 2024-05-06 ENCOUNTER — Encounter: Payer: Self-pay | Admitting: Hematology

## 2024-05-10 DIAGNOSIS — C259 Malignant neoplasm of pancreas, unspecified: Secondary | ICD-10-CM | POA: Diagnosis not present

## 2024-05-10 NOTE — Assessment & Plan Note (Signed)
-  stage IV with liver mets, MSS, KRAS G12R mutation (+) -diagnosed in 10/2023 -Patient has a history of breast cancer. She presents with dyspnea, chest discomfort, decreased appetite, and weight loss. She also reports new onset back pain. -CT showed a 2.9cm mass in pancreatic neck/body, and multiple liver mets, liver biopsy confirmed adenocarcinoma  -I reviewed the aggressive nature of pancreatic cancer, and the incurable nature of her disease due to diffuse liver metastasis. -she start first line chemo Nalirifox chemotherapy regimen on 11/12, with dose reduction for first cycle due to poor PS -Performed molecular testing on tumor tissue to identify potential targeted therapy options, unfortunately no actionable mutations detected  -She underwent genetic testing and came back negative  -restaging CT 04/20/2024 showed mild improvement, will continue current therapy

## 2024-05-11 ENCOUNTER — Inpatient Hospital Stay (HOSPITAL_BASED_OUTPATIENT_CLINIC_OR_DEPARTMENT_OTHER): Admitting: Hematology

## 2024-05-11 ENCOUNTER — Inpatient Hospital Stay

## 2024-05-11 ENCOUNTER — Inpatient Hospital Stay: Admitting: Dietician

## 2024-05-11 ENCOUNTER — Encounter: Payer: Self-pay | Admitting: Hematology

## 2024-05-11 VITALS — BP 108/70 | HR 76 | Temp 97.2°F | Resp 20 | Ht 61.5 in | Wt 167.4 lb

## 2024-05-11 DIAGNOSIS — C259 Malignant neoplasm of pancreas, unspecified: Secondary | ICD-10-CM

## 2024-05-11 DIAGNOSIS — Z5189 Encounter for other specified aftercare: Secondary | ICD-10-CM | POA: Diagnosis not present

## 2024-05-11 DIAGNOSIS — C251 Malignant neoplasm of body of pancreas: Secondary | ICD-10-CM

## 2024-05-11 DIAGNOSIS — Z170 Estrogen receptor positive status [ER+]: Secondary | ICD-10-CM

## 2024-05-11 DIAGNOSIS — C50812 Malignant neoplasm of overlapping sites of left female breast: Secondary | ICD-10-CM | POA: Diagnosis not present

## 2024-05-11 DIAGNOSIS — Z5111 Encounter for antineoplastic chemotherapy: Secondary | ICD-10-CM | POA: Diagnosis not present

## 2024-05-11 DIAGNOSIS — C787 Secondary malignant neoplasm of liver and intrahepatic bile duct: Secondary | ICD-10-CM | POA: Diagnosis not present

## 2024-05-11 DIAGNOSIS — Z79811 Long term (current) use of aromatase inhibitors: Secondary | ICD-10-CM | POA: Diagnosis not present

## 2024-05-11 LAB — CBC WITH DIFFERENTIAL (CANCER CENTER ONLY)
Abs Immature Granulocytes: 0.11 10*3/uL — ABNORMAL HIGH (ref 0.00–0.07)
Basophils Absolute: 0 10*3/uL (ref 0.0–0.1)
Basophils Relative: 1 %
Eosinophils Absolute: 0.1 10*3/uL (ref 0.0–0.5)
Eosinophils Relative: 1 %
HCT: 36.7 % (ref 36.0–46.0)
Hemoglobin: 11.8 g/dL — ABNORMAL LOW (ref 12.0–15.0)
Immature Granulocytes: 2 %
Lymphocytes Relative: 16 %
Lymphs Abs: 1.2 10*3/uL (ref 0.7–4.0)
MCH: 27.2 pg (ref 26.0–34.0)
MCHC: 32.2 g/dL (ref 30.0–36.0)
MCV: 84.6 fL (ref 80.0–100.0)
Monocytes Absolute: 1 10*3/uL (ref 0.1–1.0)
Monocytes Relative: 14 %
Neutro Abs: 4.9 10*3/uL (ref 1.7–7.7)
Neutrophils Relative %: 66 %
Platelet Count: 192 10*3/uL (ref 150–400)
RBC: 4.34 MIL/uL (ref 3.87–5.11)
RDW: 16.2 % — ABNORMAL HIGH (ref 11.5–15.5)
WBC Count: 7.3 10*3/uL (ref 4.0–10.5)
nRBC: 0 % (ref 0.0–0.2)

## 2024-05-11 LAB — CMP (CANCER CENTER ONLY)
ALT: 10 U/L (ref 0–44)
AST: 15 U/L (ref 15–41)
Albumin: 3.9 g/dL (ref 3.5–5.0)
Alkaline Phosphatase: 203 U/L — ABNORMAL HIGH (ref 38–126)
Anion gap: 5 (ref 5–15)
BUN: <5 mg/dL — ABNORMAL LOW (ref 8–23)
CO2: 28 mmol/L (ref 22–32)
Calcium: 9 mg/dL (ref 8.9–10.3)
Chloride: 105 mmol/L (ref 98–111)
Creatinine: 0.56 mg/dL (ref 0.44–1.00)
GFR, Estimated: >60 mL/min (ref >60)
Glucose, Bld: 96 mg/dL (ref 70–99)
Potassium: 3.9 mmol/L (ref 3.5–5.1)
Sodium: 138 mmol/L (ref 135–145)
Total Bilirubin: 0.3 mg/dL (ref 0.0–1.2)
Total Protein: 6.7 g/dL (ref 6.5–8.1)

## 2024-05-11 MED ORDER — SODIUM CHLORIDE 0.9% FLUSH
3.0000 mL | INTRAVENOUS | Status: DC | PRN
Start: 2024-05-11 — End: 2024-05-11

## 2024-05-11 MED ORDER — DEXTROSE 5 % IV SOLN
400.0000 mg/m2 | Freq: Once | INTRAVENOUS | Status: AC
  Administered 2024-05-11: 788 mg via INTRAVENOUS
  Filled 2024-05-11: qty 39.4

## 2024-05-11 MED ORDER — DEXAMETHASONE SODIUM PHOSPHATE 10 MG/ML IJ SOLN
10.0000 mg | Freq: Once | INTRAMUSCULAR | Status: AC
  Administered 2024-05-11: 10 mg via INTRAVENOUS
  Filled 2024-05-11: qty 1

## 2024-05-11 MED ORDER — FAMOTIDINE IN NACL 20-0.9 MG/50ML-% IV SOLN
20.0000 mg | Freq: Once | INTRAVENOUS | Status: AC
  Administered 2024-05-11: 20 mg via INTRAVENOUS
  Filled 2024-05-11: qty 50

## 2024-05-11 MED ORDER — SODIUM CHLORIDE 0.9 % IV SOLN
50.0000 mg/m2 | Freq: Once | INTRAVENOUS | Status: AC
  Administered 2024-05-11: 98.9 mg via INTRAVENOUS
  Filled 2024-05-11: qty 23

## 2024-05-11 MED ORDER — ATROPINE SULFATE 1 MG/ML IV SOLN
0.4000 mg | Freq: Once | INTRAVENOUS | Status: AC
  Administered 2024-05-11: 0.4 mg via INTRAVENOUS
  Filled 2024-05-11: qty 1

## 2024-05-11 MED ORDER — SODIUM CHLORIDE 0.9% FLUSH
10.0000 mL | Freq: Once | INTRAVENOUS | Status: AC
  Administered 2024-05-11: 10 mL

## 2024-05-11 MED ORDER — DEXTROSE 5 % IV SOLN: INTRAVENOUS | Status: DC

## 2024-05-11 MED ORDER — HEPARIN SOD (PORK) LOCK FLUSH 100 UNIT/ML IV SOLN: 250.0000 [IU] | Freq: Once | INTRAVENOUS | Status: DC | PRN

## 2024-05-11 MED ORDER — PALONOSETRON HCL INJECTION 0.25 MG/5ML
0.2500 mg | Freq: Once | INTRAVENOUS | Status: AC
  Administered 2024-05-11: 0.25 mg via INTRAVENOUS
  Filled 2024-05-11: qty 5

## 2024-05-11 MED ORDER — SODIUM CHLORIDE 0.9 % IV SOLN
4500.0000 mg | INTRAVENOUS | Status: DC
  Administered 2024-05-11: 4500 mg via INTRAVENOUS
  Filled 2024-05-11: qty 90

## 2024-05-11 NOTE — Progress Notes (Signed)
 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 Folfirinox discontinued after first cycle due to reaction. She is currently receiving Nalirifox q28d.   Met with patient in infusion. She reports not feeling well today. Patient endorses fatigue and poor appetite. She continues to force oral intake. This has improved some with discontinuing oxaliplatin . Patient now able to have cold foods. Patient has been enjoying ice cream. She is planning to make smoothies with Ensure Complete. She is requesting a few samples to try.    Medications: reviewed   Labs: BUN 5  Anthropometrics: Wt 167 lb 6.4 oz today - stable   4/30 - 166 lb 1 oz 4/16 - 171 lb 12.8 oz 4/2 - 169 lb 3.2 oz 3/19 - 171 lb 14.4 oz   NUTRITION DIAGNOSIS: Unintended wt loss - stable   INTERVENTION:  Continue strategies for increasing calories and protein with small frequent meals and snacks Encourage high calorie high protein foods - provided shake and smoothie recipes Ensure Complete + CIB samples provided     MONITORING, EVALUATION, GOAL: wt trends, intake   NEXT VISIT: Wednesday June 11 during infusion

## 2024-05-11 NOTE — Patient Instructions (Signed)
 [facility] - A DEPT OF [facility]  Discharge Instructions: Thank you for choosing [facility] to provide your oncology and hematology care.   If you have a lab appointment with the Cancer Center, please go directly to the Cancer Center and check in at the registration area.   Wear comfortable clothing and clothing appropriate for easy access to any Portacath or PICC line.   We strive to give you quality time with your provider. You may need to reschedule your appointment if you arrive late (15 or more minutes).  Arriving late affects you and other patients whose appointments are after yours.  Also, if you miss three or more appointments without notifying the office, you may be dismissed from the clinic at the provider's discretion.      For prescription refill requests, have your pharmacy contact our office and allow 72 hours for refills to be completed.    Today you received the following chemotherapy and/or immunotherapy agents: irinotecan  liposomal and fluorouracil       To help prevent nausea and vomiting after your treatment, we encourage you to take your nausea medication as directed.  BELOW ARE SYMPTOMS THAT SHOULD BE REPORTED IMMEDIATELY: *FEVER GREATER THAN 100.4 F (38 C) OR HIGHER *CHILLS OR SWEATING *NAUSEA AND VOMITING THAT IS NOT CONTROLLED WITH YOUR NAUSEA MEDICATION *UNUSUAL SHORTNESS OF BREATH *UNUSUAL BRUISING OR BLEEDING *URINARY PROBLEMS (pain or burning when urinating, or frequent urination) *BOWEL PROBLEMS (unusual diarrhea, constipation, pain near the anus) TENDERNESS IN MOUTH AND THROAT WITH OR WITHOUT PRESENCE OF ULCERS (sore throat, sores in mouth, or a toothache) UNUSUAL RASH, SWELLING OR PAIN  UNUSUAL VAGINAL DISCHARGE OR ITCHING   Items with * indicate a potential emergency and should be followed up as soon as possible or go to the Emergency Department if any problems should occur.  Please show the CHEMOTHERAPY  ALERT CARD or IMMUNOTHERAPY ALERT CARD at check-in to the Emergency Department and triage nurse.  Should you have questions after your visit or need to cancel or reschedule your appointment, please contact [facility] - A DEPT OF [facility]  Dept: [phone]  and follow the prompts.  Office hours are 8:00 a.m. to 4:30 p.m. Monday - Friday. Please note that voicemails left after 4:00 p.m. may not be returned until the following business day.  We are closed weekends and major holidays. You have access to a nurse at all times for urgent questions. Please call the main number to the clinic Dept: [phone] and follow the prompts.   For any non-urgent questions, you may also contact your provider using MyChart. We now offer e-Visits for anyone 32 and older to request care online for non-urgent symptoms. For details visit [email or web address].   Also download the MyChart app! Go to the app store, search "MyChart", open the app, select [place], and log in with your MyChart username and password.

## 2024-05-11 NOTE — Progress Notes (Signed)
 [facility]   [phone] [phone]   Clinic Follow up Note   Patient Care Team: [name], [name] as PCP - General (Nurse Practitioner) [name], MD as Consulting Physician (Hematology and Oncology) [name], MD as Consulting Physician (Radiation Oncology) [name], MD as Consulting Physician (General Surgery) Sonja [place], MD as Consulting Physician (Hematology and Oncology)  Date of Service:  05/11/2024  CHIEF COMPLAINT: f/u of pancreatic cancer  CURRENT THERAPY:  Maintenance chemotherapy 5-FU and liposomal irinotecan  every 2 weeks  Oncology History   Pancreatic cancer (HCC) -stage IV with liver mets, MSS, KRAS G12R mutation (+) -diagnosed in 10/2023 -Patient has a history of breast cancer. She presents with dyspnea, chest discomfort, decreased appetite, and weight loss. She also reports new onset back pain. -CT showed a 2.9cm mass in pancreatic neck/body, and multiple liver mets, liver biopsy confirmed adenocarcinoma  -I reviewed the aggressive nature of pancreatic cancer, and the incurable nature of her disease due to diffuse liver metastasis. -she start first line chemo Nalirifox chemotherapy regimen on 11/12, with dose reduction for first cycle due to poor PS -Performed molecular testing on tumor tissue to identify potential targeted therapy options, unfortunately no actionable mutations detected  -She underwent genetic testing and came back negative  -restaging CT 04/20/2024 showed mild improvement, will continue current therapy   Assessment & Plan Metastatic pancreatic cancer Metastatic pancreatic cancer with ongoing treatment. Last scan showed favorable results with decreasing tumor markers. Current regimen includes irinotecan  and oxaliplatin , with a recent irinotecan  dose reduction due to side effects. Balancing treatment efficacy and quality of life is prioritized. She is willing to endure side effects to maintain disease  control, but the goal is to preserve quality of life. 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 Gabapentin  is managing symptoms, but tingling persists. Consider increasing the dose if tingling continues without drowsiness. - Continue gabapentin  100 mg twice a day - Increase gabapentin  dose if tingling persists without drowsiness  Chemotherapy-induced nausea Intermittent nausea managed with ondansetron  and prochlorperazine . Nutritional intake is variable, with some days of poor appetite. A nutritional consult is scheduled for this afternoon. - Continue ondansetron  and prochlorperazine  as needed - Attend nutritional consult this afternoon - Use Ensure for nutritional supplementation as needed  Chemotherapy-induced fatigue Ongoing fatigue, likely related to chemotherapy. Dose reduction of irinotecan  may help alleviate symptoms. - Implement 25% dose reduction of irinotecan  - Monitor for improvement in fatigue symptoms  Chemotherapy-induced bowel issues Alternating diarrhea and constipation, managed with dietary adjustments and occasional use of polyethylene glycol. Symptoms are currently manageable. - Use polyethylene glycol as needed for constipation - Continue dietary adjustments, including apple juice, to manage bowel symptoms  Chemotherapy-induced alopecia Alopecia is present and managed with medication taken twice a day. - Continue alopecia medication twice a day  Plan - She is clinically stable, we reviewed symptom management - Lab reviewed, adequate for treatment, will proceed chemotherapy today with dose reduction on irinotecan  due to her fatigue and nausea - Follow-up in 2 weeks before next cycle chemo   SUMMARY OF  ONCOLOGIC HISTORY: Oncology History  Malignant neoplasm of overlapping sites of left breast in female, estrogen receptor positive (HCC)  08/04/2018 Initial Diagnosis   Screening mammogram detected left breast mass at 9:30 position  1.2 x 1.2 x 1.1 cm.  Closer to the nipple at 9:30 position 0.8 cm lesion span 3.6 cm and a 1.5 cm apart.  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 position 2 cm from nipple, at 1 cm from nipple PASH, lymph node benign, ER 90%, PR 95%, Ki-67 20%, HER-2 negative by IHC 1+, T1CN0 stage Ia AJCC 8   09/29/2018 Surgery   Left lumpectomy: Grade 3 IDC, 1.7 cm, margins negative, lymphovascular invasion present, 0/2 lymph nodes negative, T1c N0 stage Ia   10/06/2018 Cancer Staging   Staging form: Breast, AJCC 8th Edition - Pathologic: Stage IA (pT1c, pN0(sn), cM0, G3, ER+, PR+, HER2-) - Signed by [name], MD on 10/06/2018   10/22/2018 Oncotype testing   Oncotype score 13: Distant recurrence at 9 years with hormone therapy alone 4%   11/12/2018 - 12/27/2018 Radiation Therapy   Adjuvant radiation therapy   12/27/2018 -  Anti-estrogen oral therapy   Antiestrogen therapy with letrozole  2.5 mg daily   11/30/2023 Genetic Testing   Negative genetic testing on the CancerNext-Expanded+RNAinsight panel.  VUS identified in KIT c.2083G>C and PMS2 c.-1C>A.  The report date is 11/30/2023.  The CancerNext-Expanded gene panel offered by [facility] and includes sequencing, rearrangement, and RNA analysis for the following 76 genes: AIP, ALK, APC, ATM, AXIN2, BAP1, BARD1, BMPR1A, BRCA1, BRCA2, BRIP1, CDC73, CDH1, CDK4, CDKN1B, CDKN2A, CEBPA, CHEK2, CTNNA1, DDX41, DICER1, ETV6, FH, FLCN, GATA2, LZTR1, MAX, MBD4, MEN1, MET, MLH1, MSH2, MSH3, MSH6, MUTYH, NF1, NF2, NTHL1, PALB2, PHOX2B, PMS2, POT1, PRKAR1A, PTCH1, PTEN, RAD51C, RAD51D, RB1, RET, RUNX1, SDHA, SDHAF2, SDHB, SDHC, SDHD, SMAD4, SMARCA4, SMARCB1, SMARCE1,  STK11, SUFU, TMEM127, TP53, TSC1, TSC2, VHL, and WT1 (sequencing and deletion/duplication); EGFR, HOXB13, KIT, MITF, PDGFRA, POLD1, and POLE (sequencing only); EPCAM and GREM1 (deletion/duplication only).    Pancreatic cancer (HCC)  11/03/2023 Initial Diagnosis   Pancreatic cancer (HCC)   11/11/2023 -  Chemotherapy   Patient is on Treatment Plan : PANCREAS NALIRIFOX D1, 15 Q28D     11/30/2023 Genetic Testing   Negative genetic testing on the CancerNext-Expanded+RNAinsight panel.  VUS identified in KIT c.2083G>C and PMS2 c.-1C>A.  The report date is 11/30/2023.  The CancerNext-Expanded gene panel offered by [facility] and includes sequencing, rearrangement, and RNA analysis for the following 76 genes: AIP, ALK, APC, ATM, AXIN2, BAP1, BARD1, BMPR1A, BRCA1, BRCA2, BRIP1, CDC73, CDH1, CDK4, CDKN1B, CDKN2A, CEBPA, CHEK2, CTNNA1, DDX41, DICER1, ETV6, FH, FLCN, GATA2, LZTR1, MAX, MBD4, MEN1, MET, MLH1, MSH2, MSH3, MSH6, MUTYH, NF1, NF2, NTHL1, PALB2, PHOX2B, PMS2, POT1, PRKAR1A, PTCH1, PTEN, RAD51C, RAD51D, RB1, RET, RUNX1, SDHA, SDHAF2, SDHB, SDHC, SDHD, SMAD4, SMARCA4, SMARCB1, SMARCE1, STK11, SUFU, TMEM127, TP53, TSC1, TSC2, VHL, and WT1 (sequencing and deletion/duplication); EGFR, HOXB13, KIT, MITF, PDGFRA, POLD1, and POLE (sequencing only); EPCAM and GREM1 (deletion/duplication only).    01/29/2024 Imaging   CT abdomen and pelvis with contrast  IMPRESSION: 1. Diminished size of a hypodense mass of the pancreatic neck, consistent with treatment response. 2. Diminished size and hypodensity of numerous liver metastases, with new overlying pseudocirrhotic capsular retraction, consistent with treatment response. 3. 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 4. Unchanged prominent, nonspecific retroperitoneal lymph nodes.  5. Underlying hepatic steatosis.      Discussed the use of AI scribe software for clinical note transcription with the patient, who gave verbal consent to  proceed.  History of Present Illness Ashley Clark  is a 63 year old female with metastatic pancreatic cancer who presents for follow-up.  She experiences increased fatigue and nausea, which have progressively worsened. Her appetite fluctuates, and she sometimes uses Ensure for nutritional support. She is able to care for herself and does not require medication refills.  Neuropathy has worsened in the past two months since her last dose of oxaliplatin  in March. She is cautious about picking things up but denies trouble walking. Gabapentin  100 mg twice a day helps with numbness but not with tingling.  She manages constipation with Miralax and apple juice as needed and experiences both diarrhea and constipation, which are manageable. Current medications include potassium 10 MEQs once a day, allopurinol twice a day, Zofran  and Compazine  for nausea, and B12 once a day.     All other systems were reviewed with the patient and are negative.  MEDICAL HISTORY:  Past Medical History:  Diagnosis Date   Acute pulmonary embolism (HCC) 10/29/2023   Allergy    Back pain    Cancer (HCC) 2019   left breast cancer-last radiation Dec.2019   Diabetes mellitus without complication [facility])    Medical history non-contributory    Personal history of radiation therapy    Port-A-Cath in place - removed 12-11-2023 due to port-a-cath infection. 11/13/2023   Prediabetes    Tiredness    Vitamin D  deficiency     SURGICAL HISTORY: Past Surgical History:  Procedure Laterality Date   BREAST BIOPSY Left 08/04/2018   x3   BREAST LUMPECTOMY Left    09-29-18   BREAST LUMPECTOMY WITH RADIOACTIVE SEED AND SENTINEL LYMPH NODE BIOPSY Left 09/29/2018   Procedure: LEFT BREAST LUMPECTOMY WITH RADIOACTIVE SEED AND SENTINEL LYMPH NODE BIOPSY;  Surgeon: [name], MD;  Location: [facility];  Service: General;  Laterality: Left;   COLONOSCOPY  2016   IR IMAGING GUIDED PORT INSERTION  11/03/2023   IR  PATIENT EVAL TECH 0-60 MINS  12/18/2023   IR PATIENT EVAL TECH 0-60 MINS  12/24/2023   IR PATIENT EVAL TECH 0-60 MINS  12/28/2023   IR PATIENT EVAL TECH 0-60 MINS  01/04/2024   IR PATIENT EVAL TECH 0-60 MINS  01/13/2024   IR PATIENT EVAL TECH 0-60 MINS  01/22/2024   IR RADIOLOGIST EVAL & MGMT  12/15/2023   IR REMOVAL TUN ACCESS W/ PORT W/O FL MOD SED  12/11/2023   POLYPECTOMY     WISDOM TOOTH EXTRACTION      I have reviewed the social history and family history with the patient and they are unchanged from previous note.  ALLERGIES:  is allergic to irinotecan  liposome.  MEDICATIONS:  Current Outpatient Medications  Medication Sig Dispense Refill   apixaban  (ELIQUIS ) 5 MG TABS tablet Take 1 tablet (5 mg total) by mouth 2 (two) times daily. 60 tablet 5   cetirizine (ZYRTEC) 10 MG chewable tablet Chew 10 mg by mouth daily.     lidocaine -prilocaine  (EMLA ) cream Apply to affected area once (Patient taking differently: Apply 1 Application topically See admin instructions. Apply to affected area for port access) 30 g 3   ondansetron  (ZOFRAN ) 8 MG tablet Take 1 tablet (8 mg total) by mouth every 8 (eight) hours as needed for nausea or vomiting. Start on the third day after irinotecan  30 tablet 1   potassium chloride  (KLOR-CON  M) 10 MEQ tablet Take 1 tablet (10 mEq total) by mouth 2 (two) times daily. 60 tablet 1   prochlorperazine  (COMPAZINE ) 10 MG tablet Take 1 tablet (10  mg total) by mouth every 6 (six) hours as needed for nausea or vomiting. 30 tablet 1   No current facility-administered medications for this visit.   Facility-Administered Medications Ordered in Other Visits  Medication Dose Route Frequency Provider Last Rate Last Admin   heparin  lock flush 100 unit/mL  500 Units Intracatheter Once PRN Sonja Killian, MD       sodium chloride  flush (NS) 0.9 % injection 10 mL  10 mL Intracatheter PRN Sonja [place], MD        PHYSICAL EXAMINATION: ECOG PERFORMANCE STATUS: 2 - Symptomatic, <50% confined  to bed  Vitals:   05/11/24 1054  BP: 108/70  Pulse: 76  Resp: 20  Temp: (!) 97.2 F (36.2 C)  SpO2: 95%   Wt Readings from Last 3 Encounters:  05/11/24 167 lb 6.4 oz (75.9 kg)  04/27/24 166 lb 1 oz (75.3 kg)  04/13/24 171 lb 12.8 oz (77.9 kg)     GENERAL:alert, no distress and comfortable SKIN: skin color, texture, turgor are normal, no rashes or significant lesions EYES: normal, Conjunctiva are pink and non-injected, sclera clear NECK: supple, thyroid normal size, non-tender, without nodularity LYMPH:  no palpable lymphadenopathy in the cervical, axillary  LUNGS: clear to auscultation and percussion with normal breathing effort HEART: regular rate & rhythm and no murmurs and no lower extremity edema ABDOMEN:abdomen soft, non-tender and normal bowel sounds Musculoskeletal:no cyanosis of digits and no clubbing  NEURO: alert & oriented x 3 with fluent speech, no focal motor/sensory deficits  Physical Exam    LABORATORY DATA:  I have reviewed the data as listed    Latest Ref Rng & Units 05/11/2024   10:21 AM 04/27/2024   10:59 AM 04/13/2024    8:37 AM  CBC  WBC 4.0 - 10.5 K/uL 7.3  5.9  6.7   Hemoglobin 12.0 - 15.0 g/dL 81.1  91.4  78.2   Hematocrit 36.0 - 46.0 % 36.7  36.5  36.4   Platelets 150 - 400 K/uL 192  201  186         Latest Ref Rng & Units 05/11/2024   10:21 AM 04/27/2024   10:59 AM 04/13/2024    8:37 AM  CMP  Glucose 70 - 99 mg/dL 96  956  213   BUN 8 - 23 mg/dL <5  <5  9   Creatinine 0.44 - 1.00 mg/dL 0.86  5.78  4.69   Sodium 135 - 145 mmol/L 138  139  141   Potassium 3.5 - 5.1 mmol/L 3.9  3.2  3.5   Chloride 98 - 111 mmol/L 105  105  106   CO2 22 - 32 mmol/L 28  27  29    Calcium  8.9 - 10.3 mg/dL 9.0  8.9  9.2   Total Protein 6.5 - 8.1 g/dL 6.7  6.6  6.6   Total Bilirubin 0.0 - 1.2 mg/dL 0.3  0.3  0.3   Alkaline Phos 38 - 126 U/L 203  214  215   AST 15 - 41 U/L 15  17  19    ALT 0 - 44 U/L 10  11  11        RADIOGRAPHIC STUDIES: I have  personally reviewed the radiological images as listed and agreed with the findings in the report. No results found.    No orders of the defined types were placed in this encounter.  All questions were answered. The patient knows to call the clinic with any problems, questions or concerns. No barriers  to learning was detected. The total time spent in the appointment was 30 minutes, including review of chart and various tests results, discussions about plan of care and coordination of care plan     Sonja An[place], MD 05/11/2024

## 2024-05-13 ENCOUNTER — Inpatient Hospital Stay

## 2024-05-13 ENCOUNTER — Encounter

## 2024-05-13 VITALS — BP 100/67 | HR 79 | Temp 98.6°F | Resp 17

## 2024-05-13 DIAGNOSIS — Z170 Estrogen receptor positive status [ER+]: Secondary | ICD-10-CM | POA: Diagnosis not present

## 2024-05-13 DIAGNOSIS — C251 Malignant neoplasm of body of pancreas: Secondary | ICD-10-CM

## 2024-05-13 DIAGNOSIS — Z79811 Long term (current) use of aromatase inhibitors: Secondary | ICD-10-CM | POA: Diagnosis not present

## 2024-05-13 DIAGNOSIS — Z5189 Encounter for other specified aftercare: Secondary | ICD-10-CM | POA: Diagnosis not present

## 2024-05-13 DIAGNOSIS — C50812 Malignant neoplasm of overlapping sites of left female breast: Secondary | ICD-10-CM | POA: Diagnosis not present

## 2024-05-13 DIAGNOSIS — C787 Secondary malignant neoplasm of liver and intrahepatic bile duct: Secondary | ICD-10-CM | POA: Diagnosis not present

## 2024-05-13 DIAGNOSIS — Z5111 Encounter for antineoplastic chemotherapy: Secondary | ICD-10-CM | POA: Diagnosis not present

## 2024-05-13 LAB — CANCER ANTIGEN 19-9: CA 19-9: 474 U/mL — ABNORMAL HIGH (ref 0–35)

## 2024-05-13 MED ORDER — SODIUM CHLORIDE 0.9% FLUSH
10.0000 mL | Freq: Once | INTRAVENOUS | Status: AC
  Administered 2024-05-13: 10 mL

## 2024-05-13 MED ORDER — PEGFILGRASTIM INJECTION 6 MG/0.6ML ~~LOC~~
6.0000 mg | PREFILLED_SYRINGE | Freq: Once | SUBCUTANEOUS | Status: AC
  Administered 2024-05-13: 6 mg via SUBCUTANEOUS
  Filled 2024-05-13: qty 0.6

## 2024-05-13 MED ORDER — HEPARIN SOD (PORK) LOCK FLUSH 100 UNIT/ML IV SOLN
500.0000 [IU] | Freq: Once | INTRAVENOUS | Status: AC
Start: 2024-05-13 — End: 2024-05-13
  Administered 2024-05-13: 500 [IU]

## 2024-05-16 ENCOUNTER — Telehealth: Payer: Self-pay

## 2024-05-16 NOTE — Telephone Encounter (Signed)
 LVM for pt regarding her Disability form being completed,faxed,and confirmation received. No questions or concerns at this time.

## 2024-05-17 ENCOUNTER — Telehealth: Payer: Self-pay

## 2024-05-17 NOTE — Telephone Encounter (Signed)
 Notified the pt regarding her disability forms were completed and she stated she would pick up her copy tomorrow 05/18/2024, after her appointment. No questions or concerns at this time.

## 2024-05-18 ENCOUNTER — Inpatient Hospital Stay

## 2024-05-18 VITALS — BP 97/70 | HR 79 | Temp 98.6°F | Resp 18

## 2024-05-18 DIAGNOSIS — C787 Secondary malignant neoplasm of liver and intrahepatic bile duct: Secondary | ICD-10-CM | POA: Diagnosis not present

## 2024-05-18 DIAGNOSIS — C251 Malignant neoplasm of body of pancreas: Secondary | ICD-10-CM | POA: Diagnosis not present

## 2024-05-18 DIAGNOSIS — Z170 Estrogen receptor positive status [ER+]: Secondary | ICD-10-CM | POA: Diagnosis not present

## 2024-05-18 DIAGNOSIS — Z79811 Long term (current) use of aromatase inhibitors: Secondary | ICD-10-CM | POA: Diagnosis not present

## 2024-05-18 DIAGNOSIS — Z5111 Encounter for antineoplastic chemotherapy: Secondary | ICD-10-CM | POA: Diagnosis not present

## 2024-05-18 DIAGNOSIS — C50812 Malignant neoplasm of overlapping sites of left female breast: Secondary | ICD-10-CM | POA: Diagnosis not present

## 2024-05-18 DIAGNOSIS — Z5189 Encounter for other specified aftercare: Secondary | ICD-10-CM | POA: Diagnosis not present

## 2024-05-18 MED ORDER — SODIUM CHLORIDE 0.9% FLUSH
10.0000 mL | Freq: Once | INTRAVENOUS | Status: AC
  Administered 2024-05-18: 10 mL

## 2024-05-18 MED ORDER — HEPARIN SOD (PORK) LOCK FLUSH 100 UNIT/ML IV SOLN
500.0000 [IU] | Freq: Once | INTRAVENOUS | Status: AC
  Administered 2024-05-18: 500 [IU]

## 2024-05-19 ENCOUNTER — Other Ambulatory Visit: Payer: Self-pay

## 2024-05-24 ENCOUNTER — Encounter: Payer: Self-pay | Admitting: Hematology

## 2024-05-24 NOTE — Assessment & Plan Note (Addendum)
-  stage IV with liver mets, MSS, KRAS G12R mutation (+) -diagnosed in 10/2023 -Patient has a history of breast cancer. She presents with dyspnea, chest discomfort, decreased appetite, and weight loss. She also reports new onset back pain. -CT showed a 2.9cm mass in pancreatic neck/body, and multiple liver mets, liver biopsy confirmed adenocarcinoma  -I reviewed the aggressive nature of pancreatic cancer, and the incurable nature of her disease due to diffuse liver metastasis. -she start first line chemo Nalirifox chemotherapy regimen on 11/12, with dose reduction for first cycle due to poor PS -Performed molecular testing on tumor tissue to identify potential targeted therapy options, unfortunately no actionable mutations detected  -She underwent genetic testing and came back negative  -restaging CT 04/20/2024 showed mild improvement, chemo changed to 5-fu and liposomal irinotecan  in April 2025

## 2024-05-25 ENCOUNTER — Inpatient Hospital Stay

## 2024-05-25 ENCOUNTER — Inpatient Hospital Stay (HOSPITAL_BASED_OUTPATIENT_CLINIC_OR_DEPARTMENT_OTHER): Admitting: Hematology

## 2024-05-25 VITALS — BP 114/64 | HR 80 | Temp 96.5°F | Resp 20 | Ht 61.5 in | Wt 167.5 lb

## 2024-05-25 DIAGNOSIS — Z5189 Encounter for other specified aftercare: Secondary | ICD-10-CM | POA: Diagnosis not present

## 2024-05-25 DIAGNOSIS — C251 Malignant neoplasm of body of pancreas: Secondary | ICD-10-CM

## 2024-05-25 DIAGNOSIS — Z5111 Encounter for antineoplastic chemotherapy: Secondary | ICD-10-CM | POA: Diagnosis not present

## 2024-05-25 DIAGNOSIS — C259 Malignant neoplasm of pancreas, unspecified: Secondary | ICD-10-CM | POA: Diagnosis not present

## 2024-05-25 DIAGNOSIS — Z170 Estrogen receptor positive status [ER+]: Secondary | ICD-10-CM | POA: Diagnosis not present

## 2024-05-25 DIAGNOSIS — Z79811 Long term (current) use of aromatase inhibitors: Secondary | ICD-10-CM | POA: Diagnosis not present

## 2024-05-25 DIAGNOSIS — C787 Secondary malignant neoplasm of liver and intrahepatic bile duct: Secondary | ICD-10-CM | POA: Diagnosis not present

## 2024-05-25 DIAGNOSIS — C50812 Malignant neoplasm of overlapping sites of left female breast: Secondary | ICD-10-CM | POA: Diagnosis not present

## 2024-05-25 LAB — CBC WITH DIFFERENTIAL (CANCER CENTER ONLY)
Abs Immature Granulocytes: 0.08 10*3/uL — ABNORMAL HIGH (ref 0.00–0.07)
Basophils Absolute: 0 10*3/uL (ref 0.0–0.1)
Basophils Relative: 1 %
Eosinophils Absolute: 0.1 10*3/uL (ref 0.0–0.5)
Eosinophils Relative: 1 %
HCT: 36.6 % (ref 36.0–46.0)
Hemoglobin: 12 g/dL (ref 12.0–15.0)
Immature Granulocytes: 1 %
Lymphocytes Relative: 17 %
Lymphs Abs: 1 10*3/uL (ref 0.7–4.0)
MCH: 27.8 pg (ref 26.0–34.0)
MCHC: 32.8 g/dL (ref 30.0–36.0)
MCV: 84.7 fL (ref 80.0–100.0)
Monocytes Absolute: 0.7 10*3/uL (ref 0.1–1.0)
Monocytes Relative: 11 %
Neutro Abs: 4.2 10*3/uL (ref 1.7–7.7)
Neutrophils Relative %: 69 %
Platelet Count: 199 10*3/uL (ref 150–400)
RBC: 4.32 MIL/uL (ref 3.87–5.11)
RDW: 16.7 % — ABNORMAL HIGH (ref 11.5–15.5)
WBC Count: 6.1 10*3/uL (ref 4.0–10.5)
nRBC: 0 % (ref 0.0–0.2)

## 2024-05-25 LAB — CMP (CANCER CENTER ONLY)
ALT: 8 U/L (ref 0–44)
AST: 15 U/L (ref 15–41)
Albumin: 3.9 g/dL (ref 3.5–5.0)
Alkaline Phosphatase: 196 U/L — ABNORMAL HIGH (ref 38–126)
Anion gap: 5 (ref 5–15)
BUN: 7 mg/dL — ABNORMAL LOW (ref 8–23)
CO2: 27 mmol/L (ref 22–32)
Calcium: 9.2 mg/dL (ref 8.9–10.3)
Chloride: 106 mmol/L (ref 98–111)
Creatinine: 0.58 mg/dL (ref 0.44–1.00)
GFR, Estimated: >60 mL/min (ref >60)
Glucose, Bld: 104 mg/dL — ABNORMAL HIGH (ref 70–99)
Potassium: 3.9 mmol/L (ref 3.5–5.1)
Sodium: 138 mmol/L (ref 135–145)
Total Bilirubin: 0.3 mg/dL (ref 0.0–1.2)
Total Protein: 6.7 g/dL (ref 6.5–8.1)

## 2024-05-25 MED ORDER — DEXAMETHASONE SODIUM PHOSPHATE 10 MG/ML IJ SOLN
10.0000 mg | Freq: Once | INTRAMUSCULAR | Status: AC
  Administered 2024-05-25: 10 mg via INTRAVENOUS
  Filled 2024-05-25: qty 1

## 2024-05-25 MED ORDER — LEUCOVORIN CALCIUM INJECTION 350 MG
400.0000 mg/m2 | Freq: Once | INTRAVENOUS | Status: AC
  Administered 2024-05-25: 788 mg via INTRAVENOUS
  Filled 2024-05-25: qty 25

## 2024-05-25 MED ORDER — FAMOTIDINE IN NACL 20-0.9 MG/50ML-% IV SOLN
20.0000 mg | Freq: Once | INTRAVENOUS | Status: AC
  Administered 2024-05-25: 20 mg via INTRAVENOUS
  Filled 2024-05-25: qty 50

## 2024-05-25 MED ORDER — SODIUM CHLORIDE 0.9 % IV SOLN
50.0000 mg/m2 | Freq: Once | INTRAVENOUS | Status: AC
  Administered 2024-05-25: 98.9 mg via INTRAVENOUS
  Filled 2024-05-25: qty 23

## 2024-05-25 MED ORDER — SODIUM CHLORIDE 0.9% FLUSH
10.0000 mL | Freq: Once | INTRAVENOUS | Status: AC
  Administered 2024-05-25: 10 mL

## 2024-05-25 MED ORDER — SODIUM CHLORIDE 0.9 % IV SOLN
4500.0000 mg | INTRAVENOUS | Status: DC
  Administered 2024-05-25: 4500 mg via INTRAVENOUS
  Filled 2024-05-25: qty 90

## 2024-05-25 MED ORDER — DEXTROSE 5 % IV SOLN: INTRAVENOUS | Status: DC

## 2024-05-25 MED ORDER — ATROPINE SULFATE 1 MG/ML IV SOLN
0.4000 mg | Freq: Once | INTRAVENOUS | Status: AC
  Administered 2024-05-25: 0.4 mg via INTRAVENOUS
  Filled 2024-05-25: qty 1

## 2024-05-25 MED ORDER — PALONOSETRON HCL INJECTION 0.25 MG/5ML
0.2500 mg | Freq: Once | INTRAVENOUS | Status: AC
  Administered 2024-05-25: 0.25 mg via INTRAVENOUS
  Filled 2024-05-25: qty 5

## 2024-05-25 NOTE — Progress Notes (Signed)
 [facility]   [phone] [phone]   Clinic Follow up Note   Patient Care Team: [name], [name] as PCP - General (Nurse Practitioner) [name], MD as Consulting Physician (Hematology and Oncology) [name], MD as Consulting Physician (Radiation Oncology) [name], MD as Consulting Physician (General Surgery) Sonja [place], MD as Consulting Physician (Hematology and Oncology)  Date of Service:  05/25/2024  CHIEF COMPLAINT: f/u of pancreatic cancer  CURRENT THERAPY:  Chemotherapy liposomal irinotecan  and 5-FU  Oncology History   Pancreatic cancer (HCC) -stage IV with liver mets, MSS, KRAS G12R mutation (+) -diagnosed in 10/2023 -Patient has a history of breast cancer. She presents with dyspnea, chest discomfort, decreased appetite, and weight loss. She also reports new onset back pain. -CT showed a 2.9cm mass in pancreatic neck/body, and multiple liver mets, liver biopsy confirmed adenocarcinoma  -I reviewed the aggressive nature of pancreatic cancer, and the incurable nature of her disease due to diffuse liver metastasis. -she start first line chemo Nalirifox chemotherapy regimen on 11/12, with dose reduction for first cycle due to poor PS -Performed molecular testing on tumor tissue to identify potential targeted therapy options, unfortunately no actionable mutations detected  -She underwent genetic testing and came back negative  -restaging CT 04/20/2024 showed mild improvement, chemo changed to 5-fu and liposomal irinotecan  in April 2025  Assessment & Plan Pancreatic cancer Pancreatic cancer under maintenance therapy with well-managed weight and effective tumor marker response. Reports transient chest pain, likely due to indigestion. Chemotherapy dose was slightly decreased last cycle without noticeable change in symptoms. Manages side effects with Compazine  and Miralax as needed. - Continue current chemotherapy regimen with dose  adjustments as needed - Schedule next scan for late July or early August - Monitor tumor markers regularly - Ensure PICC line dressing changes every other Wednesday - Discuss potential dose adjustment of pump infusion if symptoms worsen  Chemotherapy-induced nausea Nausea persists for 2-3 days post-chemotherapy, managed with Compazine . Maintains hydration and nutrition despite symptoms. - Continue Compazine  as needed for nausea  Chemotherapy-induced peripheral neuropathy Experiences numbness in hands and feet, but manages daily activities. Symptoms are persistent but manageable. Uses stress ball exercises to aid hand function. - Encourage use of stress ball for hand exercises  Constipation Constipation managed with Miralax as needed. No new issues reported. - Continue Miralax as needed for constipation  Plan - Labs reviewed, adequate for treatment, will proceed same reduced dose chemo today and continue every 2 weeks - Follow-up in 2 weeks   SUMMARY OF ONCOLOGIC HISTORY: Oncology History  Malignant neoplasm of overlapping sites of left breast in female, estrogen receptor positive (HCC)  08/04/2018 Initial Diagnosis   Screening mammogram detected left breast mass at 9:30 position 1.2 x 1.2 x 1.1 cm.  Closer to the nipple at 9:30 position 0.8 cm lesion span 3.6 cm and a 1.5 cm apart.  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 position 2 cm from nipple, at 1 cm from nipple PASH, lymph node benign, ER 90%, PR 95%, Ki-67 20%, HER-2 negative by IHC 1+, T1CN0 stage Ia AJCC 8   09/29/2018 Surgery   Left lumpectomy: Grade 3 IDC, 1.7 cm, margins negative, lymphovascular invasion present, 0/2 lymph nodes negative, T1c N0 stage Ia   10/06/2018 Cancer Staging   Staging form: Breast, AJCC 8th Edition - Pathologic: Stage IA (pT1c, pN0(sn), cM0, G3, ER+, PR+, HER2-) - Signed  by [name], MD on 10/06/2018   10/22/2018  Oncotype testing   Oncotype score 13: Distant recurrence at 9 years with hormone therapy alone 4%   11/12/2018 - 12/27/2018 Radiation Therapy   Adjuvant radiation therapy   12/27/2018 -  Anti-estrogen oral therapy   Antiestrogen therapy with letrozole  2.5 mg daily   11/30/2023 Genetic Testing   Negative genetic testing on the CancerNext-Expanded+RNAinsight panel.  VUS identified in KIT c.2083G>C and PMS2 c.-1C>A.  The report date is 11/30/2023.  The CancerNext-Expanded gene panel offered by [facility] and includes sequencing, rearrangement, and RNA analysis for the following 76 genes: AIP, ALK, APC, ATM, AXIN2, BAP1, BARD1, BMPR1A, BRCA1, BRCA2, BRIP1, CDC73, CDH1, CDK4, CDKN1B, CDKN2A, CEBPA, CHEK2, CTNNA1, DDX41, DICER1, ETV6, FH, FLCN, GATA2, LZTR1, MAX, MBD4, MEN1, MET, MLH1, MSH2, MSH3, MSH6, MUTYH, NF1, NF2, NTHL1, PALB2, PHOX2B, PMS2, POT1, PRKAR1A, PTCH1, PTEN, RAD51C, RAD51D, RB1, RET, RUNX1, SDHA, SDHAF2, SDHB, SDHC, SDHD, SMAD4, SMARCA4, SMARCB1, SMARCE1, STK11, SUFU, TMEM127, TP53, TSC1, TSC2, VHL, and WT1 (sequencing and deletion/duplication); EGFR, HOXB13, KIT, MITF, PDGFRA, POLD1, and POLE (sequencing only); EPCAM and GREM1 (deletion/duplication only).    Pancreatic cancer (HCC)  11/03/2023 Initial Diagnosis   Pancreatic cancer (HCC)   11/11/2023 -  Chemotherapy   Patient is on Treatment Plan : PANCREAS NALIRIFOX D1, 15 Q28D     11/30/2023 Genetic Testing   Negative genetic testing on the CancerNext-Expanded+RNAinsight panel.  VUS identified in KIT c.2083G>C and PMS2 c.-1C>A.  The report date is 11/30/2023.  The CancerNext-Expanded gene panel offered by [facility] and includes sequencing, rearrangement, and RNA analysis for the following 76 genes: AIP, ALK, APC, ATM, AXIN2, BAP1, BARD1, BMPR1A, BRCA1, BRCA2, BRIP1, CDC73, CDH1, CDK4, CDKN1B, CDKN2A, CEBPA, CHEK2, CTNNA1, DDX41, DICER1, ETV6, FH, FLCN, GATA2, LZTR1, MAX, MBD4, MEN1, MET, MLH1, MSH2, MSH3, MSH6, MUTYH, NF1,  NF2, NTHL1, PALB2, PHOX2B, PMS2, POT1, PRKAR1A, PTCH1, PTEN, RAD51C, RAD51D, RB1, RET, RUNX1, SDHA, SDHAF2, SDHB, SDHC, SDHD, SMAD4, SMARCA4, SMARCB1, SMARCE1, STK11, SUFU, TMEM127, TP53, TSC1, TSC2, VHL, and WT1 (sequencing and deletion/duplication); EGFR, HOXB13, KIT, MITF, PDGFRA, POLD1, and POLE (sequencing only); EPCAM and GREM1 (deletion/duplication only).    01/29/2024 Imaging   CT abdomen and pelvis with contrast  IMPRESSION: 1. Diminished size of a hypodense mass of the pancreatic neck, consistent with treatment response. 2. Diminished size and hypodensity of numerous liver metastases, with new overlying pseudocirrhotic capsular retraction, consistent with treatment response. 3. 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 4. Unchanged prominent, nonspecific retroperitoneal lymph nodes.  5. Underlying hepatic steatosis.      Discussed the use of AI scribe software for clinical note transcription with the patient, who gave verbal consent to proceed.  History of Present Illness Ashley Clark is a 63 year old female with pancreatic cancer who presents for follow-up.  She is undergoing chemotherapy with a slightly reduced dose, experiencing fatigue and nausea, particularly in the days following treatment. Nausea is managed with Compazine  and is most severe for two to three days post-treatment. Her weight remains stable at 167 pounds despite these symptoms.  She experiences numbness in her hands and feet, affecting her ability to use her hands, but continues daily activities like cooking and exercising. She uses a stress ball to manage these symptoms.  A PICC line is in place for chemotherapy, with dressing changes scheduled every other Wednesday. She is on a two-week chemotherapy cycle and has adjusted her schedule for travel.  She takes a potassium supplement of 10 MEQs daily,  with recent labs showing a potassium level of 3.9. Miralax is used for constipation  management. She experienced a brief episode of chest pain last night, attributed to indigestion, which resolved by morning.     All other systems were reviewed with the patient and are negative.  MEDICAL HISTORY:  Past Medical History:  Diagnosis Date   Acute pulmonary embolism (HCC) 10/29/2023   Allergy    Back pain    Cancer (HCC) 2019   left breast cancer-last radiation Dec.2019   Diabetes mellitus without complication [facility]    Medical history non-contributory    Personal history of radiation therapy    Port-A-Cath in place - removed 12-11-2023 due to port-a-cath infection. 11/13/2023   Prediabetes    Tiredness    Vitamin D  deficiency     SURGICAL HISTORY: Past Surgical History:  Procedure Laterality Date   BREAST BIOPSY Left 08/04/2018   x3   BREAST LUMPECTOMY Left    09-29-18   BREAST LUMPECTOMY WITH RADIOACTIVE SEED AND SENTINEL LYMPH NODE BIOPSY Left 09/29/2018   Procedure: LEFT BREAST LUMPECTOMY WITH RADIOACTIVE SEED AND SENTINEL LYMPH NODE BIOPSY;  Surgeon: [name], MD;  Location: [facility];  Service: General;  Laterality: Left;   COLONOSCOPY  2016   IR IMAGING GUIDED PORT INSERTION  11/03/2023   IR PATIENT EVAL TECH 0-60 MINS  12/18/2023   IR PATIENT EVAL TECH 0-60 MINS  12/24/2023   IR PATIENT EVAL TECH 0-60 MINS  12/28/2023   IR PATIENT EVAL TECH 0-60 MINS  01/04/2024   IR PATIENT EVAL TECH 0-60 MINS  01/13/2024   IR PATIENT EVAL TECH 0-60 MINS  01/22/2024   IR RADIOLOGIST EVAL & MGMT  12/15/2023   IR REMOVAL TUN ACCESS W/ PORT W/O FL MOD SED  12/11/2023   POLYPECTOMY     WISDOM TOOTH EXTRACTION      I have reviewed the social history and family history with the patient and they are unchanged from previous note.  ALLERGIES:  is allergic to irinotecan  liposome.  MEDICATIONS:  Current Outpatient Medications  Medication Sig Dispense Refill   apixaban  (ELIQUIS ) 5 MG TABS tablet Take 1 tablet (5 mg total) by mouth 2 (two) times daily. 60  tablet 5   cetirizine (ZYRTEC) 10 MG chewable tablet Chew 10 mg by mouth daily.     lidocaine -prilocaine  (EMLA ) cream Apply to affected area once (Patient taking differently: Apply 1 Application topically See admin instructions. Apply to affected area for port access) 30 g 3   ondansetron  (ZOFRAN ) 8 MG tablet Take 1 tablet (8 mg total) by mouth every 8 (eight) hours as needed for nausea or vomiting. Start on the third day after irinotecan  30 tablet 1   potassium chloride  (KLOR-CON  M) 10 MEQ tablet Take 1 tablet (10 mEq total) by mouth 2 (two) times daily. 60 tablet 1   prochlorperazine  (COMPAZINE ) 10 MG tablet Take 1 tablet (10 mg total) by mouth every 6 (six) hours as needed for nausea or vomiting. 30 tablet 1   No current facility-administered medications for this visit.   Facility-Administered Medications Ordered in Other Visits  Medication Dose Route Frequency Provider Last Rate Last Admin   dextrose  5 % solution   Intravenous Continuous Sonja [name], MD   Stopped at 05/25/24 1352   fluorouracil  (ADRUCIL ) 4,500 mg in sodium chloride  0.9 % 60 mL chemo infusion  4,500 mg Intravenous 1 day or 1 dose Sonja Kent, MD   Infusion Verify at 05/25/24 1415   heparin   lock flush 100 unit/mL  500 Units Intracatheter Once PRN Sonja Chicora, MD       sodium chloride  flush (NS) 0.9 % injection 10 mL  10 mL Intracatheter PRN Sonja Martorell, MD        PHYSICAL EXAMINATION: ECOG PERFORMANCE STATUS: 2 - Symptomatic, <50% confined to bed  Vitals:   05/25/24 0950  BP: 114/64  Pulse: 80  Resp: 20  Temp: (!) 96.5 F (35.8 C)  SpO2: 96%   Wt Readings from Last 3 Encounters:  05/25/24 167 lb 8 oz (76 kg)  05/11/24 167 lb 6.4 oz (75.9 kg)  04/27/24 166 lb 1 oz (75.3 kg)     GENERAL:alert, no distress and comfortable SKIN: skin color, texture, turgor are normal, no rashes or significant lesions EYES: normal, Conjunctiva are pink and non-injected, sclera clear NECK: supple, thyroid normal size, non-tender, without  nodularity LYMPH:  no palpable lymphadenopathy in the cervical, axillary  LUNGS: clear to auscultation and percussion with normal breathing effort HEART: regular rate & rhythm and no murmurs and no lower extremity edema ABDOMEN:abdomen soft, non-tender and normal bowel sounds Musculoskeletal:no cyanosis of digits and no clubbing  NEURO: alert & oriented x 3 with fluent speech, no focal motor/sensory deficits  Physical Exam MEASUREMENTS: Weight- 167.  LABORATORY DATA:  I have reviewed the data as listed    Latest Ref Rng & Units 05/25/2024    9:08 AM 05/11/2024   10:21 AM 04/27/2024   10:59 AM  CBC  WBC 4.0 - 10.5 K/uL 6.1  7.3  5.9   Hemoglobin 12.0 - 15.0 g/dL 13.2  44.0  10.2   Hematocrit 36.0 - 46.0 % 36.6  36.7  36.5   Platelets 150 - 400 K/uL 199  192  201         Latest Ref Rng & Units 05/25/2024    9:08 AM 05/11/2024   10:21 AM 04/27/2024   10:59 AM  CMP  Glucose 70 - 99 mg/dL 725  96  366   BUN 8 - 23 mg/dL 7  <5  <5   Creatinine 0.44 - 1.00 mg/dL 4.40  3.47  4.25   Sodium 135 - 145 mmol/L 138  138  139   Potassium 3.5 - 5.1 mmol/L 3.9  3.9  3.2   Chloride 98 - 111 mmol/L 106  105  105   CO2 22 - 32 mmol/L 27  28  27    Calcium  8.9 - 10.3 mg/dL 9.2  9.0  8.9   Total Protein 6.5 - 8.1 g/dL 6.7  6.7  6.6   Total Bilirubin 0.0 - 1.2 mg/dL 0.3  0.3  0.3   Alkaline Phos 38 - 126 U/L 196  203  214   AST 15 - 41 U/L 15  15  17    ALT 0 - 44 U/L 8  10  11        RADIOGRAPHIC STUDIES: I have personally reviewed the radiological images as listed and agreed with the findings in the report. No results found.    Orders Placed This Encounter  Procedures   CBC with Differential ([facility])    Standing Status:   Future    Expected Date:   08/16/2024    Expiration Date:   08/16/2025   CMP ([facility] only)    Standing Status:   Future    Expected Date:   08/16/2024    Expiration Date:   08/16/2025   CBC with Differential ([facility])  Standing Status:    Future    Expected Date:   08/30/2024    Expiration Date:   08/30/2025   CMP ([facility])    Standing Status:   Future    Expected Date:   08/30/2024    Expiration Date:   08/30/2025   CBC with Differential ([facility])    Standing Status:   Future    Expected Date:   09/13/2024    Expiration Date:   09/13/2025   CMP ([facility])    Standing Status:   Future    Expected Date:   09/13/2024    Expiration Date:   09/13/2025   CBC with Differential ([facility])    Standing Status:   Future    Expected Date:   09/27/2024    Expiration Date:   09/27/2025   CMP ([facility])    Standing Status:   Future    Expected Date:   09/27/2024    Expiration Date:   09/27/2025   All questions were answered. The patient knows to call the clinic with any problems, questions or concerns. No barriers to learning was detected. The total time spent in the appointment was 25 minutes, including review of chart and various tests results, discussions about plan of care and coordination of care plan     Sonja Lafayette, MD 05/25/2024

## 2024-05-25 NOTE — Patient Instructions (Signed)
 [facility] - A DEPT OF [facility]  Discharge Instructions: Thank you for choosing [facility] to provide your oncology and hematology care.   If you have a lab appointment with the Cancer Center, please go directly to the Cancer Center and check in at the registration area.   Wear comfortable clothing and clothing appropriate for easy access to any Portacath or PICC line.   We strive to give you quality time with your provider. You may need to reschedule your appointment if you arrive late (15 or more minutes).  Arriving late affects you and other patients whose appointments are after yours.  Also, if you miss three or more appointments without notifying the office, you may be dismissed from the clinic at the provider's discretion.      For prescription refill requests, have your pharmacy contact our office and allow 72 hours for refills to be completed.    Today you received the following chemotherapy and/or immunotherapy agents: irinotecan  liposomal and fluorouracil       To help prevent nausea and vomiting after your treatment, we encourage you to take your nausea medication as directed.  BELOW ARE SYMPTOMS THAT SHOULD BE REPORTED IMMEDIATELY: *FEVER GREATER THAN 100.4 F (38 C) OR HIGHER *CHILLS OR SWEATING *NAUSEA AND VOMITING THAT IS NOT CONTROLLED WITH YOUR NAUSEA MEDICATION *UNUSUAL SHORTNESS OF BREATH *UNUSUAL BRUISING OR BLEEDING *URINARY PROBLEMS (pain or burning when urinating, or frequent urination) *BOWEL PROBLEMS (unusual diarrhea, constipation, pain near the anus) TENDERNESS IN MOUTH AND THROAT WITH OR WITHOUT PRESENCE OF ULCERS (sore throat, sores in mouth, or a toothache) UNUSUAL RASH, SWELLING OR PAIN  UNUSUAL VAGINAL DISCHARGE OR ITCHING   Items with * indicate a potential emergency and should be followed up as soon as possible or go to the Emergency Department if any problems should occur.  Please show the CHEMOTHERAPY  ALERT CARD or IMMUNOTHERAPY ALERT CARD at check-in to the Emergency Department and triage nurse.  Should you have questions after your visit or need to cancel or reschedule your appointment, please contact [facility] - A DEPT OF [facility]  Dept: [phone]  and follow the prompts.  Office hours are 8:00 a.m. to 4:30 p.m. Monday - Friday. Please note that voicemails left after 4:00 p.m. may not be returned until the following business day.  We are closed weekends and major holidays. You have access to a nurse at all times for urgent questions. Please call the main number to the clinic Dept: [phone] and follow the prompts.   For any non-urgent questions, you may also contact your provider using MyChart. We now offer e-Visits for anyone 32 and older to request care online for non-urgent symptoms. For details visit [email or web address].   Also download the MyChart app! Go to the app store, search "MyChart", open the app, select [place], and log in with your MyChart username and password.

## 2024-05-27 ENCOUNTER — Inpatient Hospital Stay

## 2024-05-27 VITALS — BP 130/68 | HR 66 | Temp 98.1°F | Resp 18

## 2024-05-27 DIAGNOSIS — C251 Malignant neoplasm of body of pancreas: Secondary | ICD-10-CM | POA: Diagnosis not present

## 2024-05-27 DIAGNOSIS — Z5111 Encounter for antineoplastic chemotherapy: Secondary | ICD-10-CM | POA: Diagnosis not present

## 2024-05-27 DIAGNOSIS — Z5189 Encounter for other specified aftercare: Secondary | ICD-10-CM | POA: Diagnosis not present

## 2024-05-27 DIAGNOSIS — C50812 Malignant neoplasm of overlapping sites of left female breast: Secondary | ICD-10-CM | POA: Diagnosis not present

## 2024-05-27 DIAGNOSIS — Z170 Estrogen receptor positive status [ER+]: Secondary | ICD-10-CM | POA: Diagnosis not present

## 2024-05-27 DIAGNOSIS — C787 Secondary malignant neoplasm of liver and intrahepatic bile duct: Secondary | ICD-10-CM | POA: Diagnosis not present

## 2024-05-27 DIAGNOSIS — Z79811 Long term (current) use of aromatase inhibitors: Secondary | ICD-10-CM | POA: Diagnosis not present

## 2024-05-27 MED ORDER — HEPARIN SOD (PORK) LOCK FLUSH 100 UNIT/ML IV SOLN
500.0000 [IU] | Freq: Once | INTRAVENOUS | Status: AC | PRN
  Administered 2024-05-27: 500 [IU]

## 2024-05-27 MED ORDER — SODIUM CHLORIDE 0.9% FLUSH
10.0000 mL | INTRAVENOUS | Status: DC | PRN
  Administered 2024-05-27: 10 mL

## 2024-05-27 MED ORDER — PEGFILGRASTIM INJECTION 6 MG/0.6ML ~~LOC~~
6.0000 mg | PREFILLED_SYRINGE | Freq: Once | SUBCUTANEOUS | Status: AC
  Administered 2024-05-27: 6 mg via SUBCUTANEOUS
  Filled 2024-05-27: qty 0.6

## 2024-05-27 NOTE — Patient Instructions (Signed)

## 2024-05-31 ENCOUNTER — Other Ambulatory Visit (HOSPITAL_COMMUNITY): Payer: Self-pay

## 2024-06-01 ENCOUNTER — Other Ambulatory Visit: Payer: Self-pay

## 2024-06-01 ENCOUNTER — Inpatient Hospital Stay: Attending: Hematology

## 2024-06-01 DIAGNOSIS — Z5189 Encounter for other specified aftercare: Secondary | ICD-10-CM | POA: Insufficient documentation

## 2024-06-01 DIAGNOSIS — C787 Secondary malignant neoplasm of liver and intrahepatic bile duct: Secondary | ICD-10-CM | POA: Diagnosis not present

## 2024-06-01 DIAGNOSIS — Z5111 Encounter for antineoplastic chemotherapy: Secondary | ICD-10-CM | POA: Insufficient documentation

## 2024-06-01 DIAGNOSIS — C50812 Malignant neoplasm of overlapping sites of left female breast: Secondary | ICD-10-CM

## 2024-06-01 DIAGNOSIS — C251 Malignant neoplasm of body of pancreas: Secondary | ICD-10-CM | POA: Diagnosis not present

## 2024-06-01 MED ORDER — HEPARIN SOD (PORK) LOCK FLUSH 100 UNIT/ML IV SOLN
500.0000 [IU] | Freq: Once | INTRAVENOUS | Status: AC
  Administered 2024-06-01: 500 [IU]

## 2024-06-01 MED ORDER — SODIUM CHLORIDE 0.9% FLUSH
10.0000 mL | Freq: Once | INTRAVENOUS | Status: AC
  Administered 2024-06-01: 10 mL

## 2024-06-05 ENCOUNTER — Other Ambulatory Visit: Payer: Self-pay

## 2024-06-07 ENCOUNTER — Other Ambulatory Visit: Payer: Self-pay | Admitting: Nurse Practitioner

## 2024-06-07 DIAGNOSIS — C251 Malignant neoplasm of body of pancreas: Secondary | ICD-10-CM

## 2024-06-07 NOTE — Progress Notes (Signed)
 Patient Care Team: [name], [name], [name] as PCP - General (Nurse Practitioner) [name], MD as Consulting Physician (Hematology and Oncology) [name], MD as Consulting Physician (Radiation Oncology) [name], MD as Consulting Physician (General Surgery) [name], MD as Consulting Physician (Hematology and Oncology)  Clinic Day:  06/08/2024  Referring physician: [name], [name], PA  ASSESSMENT & PLAN:   Assessment & Plan: Pancreatic cancer (HCC) -stage IV with liver mets, MSS, KRAS G12R mutation (+) -diagnosed in 10/2023 -Patient has a history of breast cancer. She presents with dyspnea, chest discomfort, decreased appetite, and weight loss. She also reports new onset back pain. -CT showed a 2.9cm mass in pancreatic neck/body, and multiple liver mets, liver biopsy confirmed adenocarcinoma  -The aggressive nature of pancreatic cancer, and the incurable nature of her disease due to diffuse liver metastasis was reviewed.  -she start first line chemo Nalirifox chemotherapy regimen on 11/12, with dose reduction for first cycle due to poor PS -Performed molecular testing on tumor tissue to identify potential targeted therapy options, unfortunately no actionable mutations detected  -She underwent genetic testing and came back negative  -restaging CT 04/20/2024 showed mild improvement, chemo changed to 5-fu and liposomal irinotecan  in April 2025 -06/08/2024 -tolerating liposomal irinotecan  and dose reduced 5-FU infusion pump well, without chest discomfort  and minimal negative side effects. - Expect to restage in late July, early August 2025.   Mild constipation Using MiraLAX on as-needed basis to manage symptoms of constipation.  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  She is unsure if this has helped manage symptoms previously.  Chest discomfort Likely related to 5-FU infusion pump.  Has improved with dose reduced 5-FU.  Will continue  with current dose reduction as she is tolerating well.  Plan Labs reviewed. -Mild and stable anemia with Hgb 11.7 and HCT 36.1. -CMP unremarkable. -CA 19-9 improving at 292. Patient labs and presentation are appropriate for treatment today.  Continue with liposomal T can add dose reduced 5-FU pump. Labs with follow-up and treatment in 2 weeks as scheduled. Continue with weekly PICC line dressing changes and flushes.   The patient understands the plans discussed today and is in agreement with them.  She knows to contact our office if she develops concerns prior to her next appointment.  I provided 25 minutes of face-to-face time during this encounter and > 50% was spent counseling as documented under my assessment and plan.    Powell FORBES Lessen, NP  [facility] - A DEPT OF [facility] [address] [place] [address] Dept: [phone] Dept Fax: [phone]   No orders of the defined types were placed in this encounter.     CHIEF COMPLAINT:  CC: Pancreatic cancer  Current Treatment: Liposomal irinotecan  and 5-FU maintenance therapy every 2 weeks  INTERVAL HISTORY:  Ashley Clark is here today for repeat clinical assessment.  She was last seen by Dr. Lanny on 05/25/2024.  Been describing intermittent and transient chest pain.  Currently getting dose reduced 5-FU due to chest discomfort.  Improved chest discomfort with reduced dose of 5-FU infusion.  Denies nausea and vomiting.  She reports that her appetite is okay.  Mild and intermittent constipation which is managed with MiraLAX.  Baseline peripheral neuropathy.  May want to add back gabapentin , but unsure at this time.  She denies fevers or chills. She denies pain. Her appetite is good. Her weight has increased 2 pounds over  last 2 weeks.  I have reviewed the past medical history, past surgical history, social history and family history with the patient and they are  unchanged from previous note.  ALLERGIES:  is allergic to irinotecan  liposome.  MEDICATIONS:  Current Outpatient Medications  Medication Sig Dispense Refill   apixaban  (ELIQUIS ) 5 MG TABS tablet Take 1 tablet (5 mg total) by mouth 2 (two) times daily. 60 tablet 5   cetirizine (ZYRTEC) 10 MG chewable tablet Chew 10 mg by mouth daily.     lidocaine -prilocaine  (EMLA ) cream Apply to affected area once (Patient taking differently: Apply 1 Application topically See admin instructions. Apply to affected area for port access) 30 g 3   ondansetron  (ZOFRAN ) 8 MG tablet Take 1 tablet (8 mg total) by mouth every 8 (eight) hours as needed for nausea or vomiting. Start on the third day after irinotecan  30 tablet 1   potassium chloride  (KLOR-CON  M) 10 MEQ tablet Take 1 tablet (10 mEq total) by mouth 2 (two) times daily. 60 tablet 1   prochlorperazine  (COMPAZINE ) 10 MG tablet Take 1 tablet (10 mg total) by mouth every 6 (six) hours as needed for nausea or vomiting. 30 tablet 1   No current facility-administered medications for this visit.   Facility-Administered Medications Ordered in Other Visits  Medication Dose Route Frequency Provider Last Rate Last Admin   heparin  lock flush 100 unit/mL  500 Units Intracatheter Once PRN [name], MD       sodium chloride  flush (NS) 0.9 % injection 10 mL  10 mL Intracatheter PRN [name], MD        HISTORY OF PRESENT ILLNESS:   Oncology History  Malignant neoplasm of overlapping sites of left breast in female, estrogen receptor positive (HCC)  08/04/2018 Initial Diagnosis   Screening mammogram detected left breast mass at 9:30 position 1.2 x 1.2 x 1.1 cm.  Closer to the nipple at 9:30 position 0.8 cm lesion span 3.6 cm and a 1.5 cm apart.  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 position 2 cm from nipple, at 1 cm from nipple PASH, lymph node benign, ER 90%, PR 95%, Ki-67 20%, HER-2 negative  by IHC 1+, T1CN0 stage Ia AJCC 8   09/29/2018 Surgery   Left lumpectomy: Grade 3 IDC, 1.7 cm, margins negative, lymphovascular invasion present, 0/2 lymph nodes negative, T1c N0 stage Ia   10/06/2018 Cancer Staging   Staging form: Breast, AJCC 8th Edition - Pathologic: Stage IA (pT1c, pN0(sn), cM0, G3, ER+, PR+, HER2-) - Signed by [name], MD on 10/06/2018   10/22/2018 Oncotype testing   Oncotype score 13: Distant recurrence at 9 years with hormone therapy alone 4%   11/12/2018 - 12/27/2018 Radiation Therapy   Adjuvant radiation therapy   12/27/2018 -  Anti-estrogen oral therapy   Antiestrogen therapy with letrozole  2.5 mg daily   11/30/2023 Genetic Testing   Negative genetic testing on the CancerNext-Expanded+RNAinsight panel.  VUS identified in KIT c.2083G>C and PMS2 c.-1C>A.  The report date is 11/30/2023.  The CancerNext-Expanded gene panel offered by [facility] and includes sequencing, rearrangement, and RNA analysis for the following 76 genes: AIP, ALK, APC, ATM, AXIN2, BAP1, BARD1, BMPR1A, BRCA1, BRCA2, BRIP1, CDC73, CDH1, CDK4, CDKN1B, CDKN2A, CEBPA, CHEK2, CTNNA1, DDX41, DICER1, ETV6, FH, FLCN, GATA2, LZTR1, MAX, MBD4, MEN1, MET, MLH1, MSH2, MSH3, MSH6, MUTYH, NF1, NF2, NTHL1, PALB2, PHOX2B, PMS2, POT1, PRKAR1A, PTCH1, PTEN, RAD51C, RAD51D,  RB1, RET, RUNX1, SDHA, SDHAF2, SDHB, SDHC, SDHD, SMAD4, SMARCA4, SMARCB1, SMARCE1, STK11, SUFU, TMEM127, TP53, TSC1, TSC2, VHL, and WT1 (sequencing and deletion/duplication); EGFR, HOXB13, KIT, MITF, PDGFRA, POLD1, and POLE (sequencing only); EPCAM and GREM1 (deletion/duplication only).    Pancreatic cancer (HCC)  11/03/2023 Initial Diagnosis   Pancreatic cancer (HCC)   11/11/2023 -  Chemotherapy   Patient is on Treatment Plan : PANCREAS NALIRIFOX D1, 15 Q28D     11/30/2023 Genetic Testing   Negative genetic testing on the CancerNext-Expanded+RNAinsight panel.  VUS identified in KIT c.2083G>C and PMS2 c.-1C>A.  The report date is  11/30/2023.  The CancerNext-Expanded gene panel offered by [facility] and includes sequencing, rearrangement, and RNA analysis for the following 76 genes: AIP, ALK, APC, ATM, AXIN2, BAP1, BARD1, BMPR1A, BRCA1, BRCA2, BRIP1, CDC73, CDH1, CDK4, CDKN1B, CDKN2A, CEBPA, CHEK2, CTNNA1, DDX41, DICER1, ETV6, FH, FLCN, GATA2, LZTR1, MAX, MBD4, MEN1, MET, MLH1, MSH2, MSH3, MSH6, MUTYH, NF1, NF2, NTHL1, PALB2, PHOX2B, PMS2, POT1, PRKAR1A, PTCH1, PTEN, RAD51C, RAD51D, RB1, RET, RUNX1, SDHA, SDHAF2, SDHB, SDHC, SDHD, SMAD4, SMARCA4, SMARCB1, SMARCE1, STK11, SUFU, TMEM127, TP53, TSC1, TSC2, VHL, and WT1 (sequencing and deletion/duplication); EGFR, HOXB13, KIT, MITF, PDGFRA, POLD1, and POLE (sequencing only); EPCAM and GREM1 (deletion/duplication only).    01/29/2024 Imaging   CT abdomen and pelvis with contrast  IMPRESSION: 1. Diminished size of a hypodense mass of the pancreatic neck, consistent with treatment response. 2. Diminished size and hypodensity of numerous liver metastases, with new overlying pseudocirrhotic capsular retraction, consistent with treatment response. 3. 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 4. Unchanged prominent, nonspecific retroperitoneal lymph nodes.  5. Underlying hepatic steatosis.       REVIEW OF SYSTEMS:   Constitutional: Denies fevers, chills or abnormal weight loss Eyes: Denies blurriness of vision Ears, nose, mouth, throat, and face: Denies mucositis or sore throat Respiratory: Denies cough, dyspnea or wheezes Cardiovascular: Denies palpitation, chest discomfort or lower extremity swelling Gastrointestinal:  Denies nausea, heartburn or change in bowel habits Skin: Denies abnormal skin rashes Lymphatics: Denies new lymphadenopathy or easy bruising Neurological:Denies numbness, tingling or new weaknesses Behavioral/Psych: Mood is stable, no new changes  All other systems were reviewed with the patient and are negative.   VITALS:    Today's Vitals   06/08/24 0903 06/08/24 0927  BP: 106/64   Pulse: 72   Resp: 17   Temp: 97.8 F (36.6 C)   SpO2: 97%   Weight: 169 lb 11.2 oz (77 kg)   PainSc:  0-No pain   Body mass index is 31.55 kg/m.   Wt Readings from Last 3 Encounters:  06/08/24 169 lb 11.2 oz (77 kg)  05/25/24 167 lb 8 oz (76 kg)  05/11/24 167 lb 6.4 oz (75.9 kg)    Body mass index is 31.55 kg/m.  Performance status (ECOG): 1 - Symptomatic but completely ambulatory  PHYSICAL EXAM:   GENERAL:alert, no distress and comfortable SKIN: skin color, texture, turgor are normal, no rashes or significant lesions EYES: normal, Conjunctiva are pink and non-injected, sclera clear OROPHARYNX:no exudate, no erythema and lips, buccal mucosa, and tongue normal  NECK: supple, thyroid normal size, non-tender, without nodularity LYMPH:  no palpable lymphadenopathy in the cervical, axillary or inguinal LUNGS: clear to auscultation and percussion with normal breathing effort HEART: regular rate & rhythm and no murmurs and no lower extremity edema ABDOMEN:abdomen soft, non-tender and normal bowel sounds Musculoskeletal:no cyanosis of digits and no clubbing  NEURO: alert & oriented x 3 with fluent speech, no  focal motor/sensory deficits  LABORATORY DATA:  I have reviewed the data as listed    Component Value Date/Time   NA 136 06/08/2024 0832   NA 136 06/10/2022 1152   K 3.8 06/08/2024 0832   CL 104 06/08/2024 0832   CO2 24 06/08/2024 0832   GLUCOSE 161 (H) 06/08/2024 0832   BUN 6 (L) 06/08/2024 0832   BUN 7 (L) 06/10/2022 1152   CREATININE 0.58 06/08/2024 0832   CALCIUM  8.8 (L) 06/08/2024 0832   PROT 6.5 06/08/2024 0832   PROT 7.6 06/10/2022 1152   ALBUMIN 3.8 06/08/2024 0832   ALBUMIN 4.6 06/10/2022 1152   AST 15 06/08/2024 0832   ALT 8 06/08/2024 0832   ALKPHOS 202 (H) 06/08/2024 0832   BILITOT 0.3 06/08/2024 0832   GFRNONAA >60 06/08/2024 0832    Lab Results  Component Value Date   WBC 5.7  06/08/2024   NEUTROABS 3.8 06/08/2024   HGB 11.7 (L) 06/08/2024   HCT 36.1 06/08/2024   MCV 85.5 06/08/2024   PLT 193 06/08/2024

## 2024-06-07 NOTE — Assessment & Plan Note (Addendum)
-  stage IV with liver mets, MSS, KRAS G12R mutation (+) -diagnosed in 10/2023 -Patient has a history of breast cancer. She presents with dyspnea, chest discomfort, decreased appetite, and weight loss. She also reports new onset back pain. -CT showed a 2.9cm mass in pancreatic neck/body, and multiple liver mets, liver biopsy confirmed adenocarcinoma  -The aggressive nature of pancreatic cancer, and the incurable nature of her disease due to diffuse liver metastasis was reviewed.  -she start first line chemo Nalirifox chemotherapy regimen on 11/12, with dose reduction for first cycle due to poor PS -Performed molecular testing on tumor tissue to identify potential targeted therapy options, unfortunately no actionable mutations detected  -She underwent genetic testing and came back negative  -restaging CT 04/20/2024 showed mild improvement, chemo changed to 5-fu and liposomal irinotecan  in April 2025 -06/08/2024 -tolerating liposomal irinotecan  and dose reduced 5-FU infusion pump well, without chest discomfort  and minimal negative side effects. - Expect to restage in late July, early August 2025.

## 2024-06-08 ENCOUNTER — Inpatient Hospital Stay: Admitting: Dietician

## 2024-06-08 ENCOUNTER — Inpatient Hospital Stay (HOSPITAL_BASED_OUTPATIENT_CLINIC_OR_DEPARTMENT_OTHER): Admitting: Nurse Practitioner

## 2024-06-08 ENCOUNTER — Inpatient Hospital Stay

## 2024-06-08 VITALS — BP 106/64 | HR 72 | Temp 97.8°F | Resp 17 | Wt 169.7 lb

## 2024-06-08 DIAGNOSIS — C787 Secondary malignant neoplasm of liver and intrahepatic bile duct: Secondary | ICD-10-CM | POA: Diagnosis not present

## 2024-06-08 DIAGNOSIS — Z5111 Encounter for antineoplastic chemotherapy: Secondary | ICD-10-CM | POA: Diagnosis not present

## 2024-06-08 DIAGNOSIS — C251 Malignant neoplasm of body of pancreas: Secondary | ICD-10-CM | POA: Diagnosis not present

## 2024-06-08 DIAGNOSIS — C259 Malignant neoplasm of pancreas, unspecified: Secondary | ICD-10-CM | POA: Diagnosis not present

## 2024-06-08 DIAGNOSIS — Z5189 Encounter for other specified aftercare: Secondary | ICD-10-CM | POA: Diagnosis not present

## 2024-06-08 DIAGNOSIS — Z170 Estrogen receptor positive status [ER+]: Secondary | ICD-10-CM

## 2024-06-08 LAB — CBC WITH DIFFERENTIAL (CANCER CENTER ONLY)
Abs Immature Granulocytes: 0.08 10*3/uL — ABNORMAL HIGH (ref 0.00–0.07)
Basophils Absolute: 0 10*3/uL (ref 0.0–0.1)
Basophils Relative: 0 %
Eosinophils Absolute: 0.1 10*3/uL (ref 0.0–0.5)
Eosinophils Relative: 1 %
HCT: 36.1 % (ref 36.0–46.0)
Hemoglobin: 11.7 g/dL — ABNORMAL LOW (ref 12.0–15.0)
Immature Granulocytes: 1 %
Lymphocytes Relative: 21 %
Lymphs Abs: 1.2 10*3/uL (ref 0.7–4.0)
MCH: 27.7 pg (ref 26.0–34.0)
MCHC: 32.4 g/dL (ref 30.0–36.0)
MCV: 85.5 fL (ref 80.0–100.0)
Monocytes Absolute: 0.5 10*3/uL (ref 0.1–1.0)
Monocytes Relative: 9 %
Neutro Abs: 3.8 10*3/uL (ref 1.7–7.7)
Neutrophils Relative %: 68 %
Platelet Count: 193 10*3/uL (ref 150–400)
RBC: 4.22 MIL/uL (ref 3.87–5.11)
RDW: 17 % — ABNORMAL HIGH (ref 11.5–15.5)
WBC Count: 5.7 10*3/uL (ref 4.0–10.5)
nRBC: 0 % (ref 0.0–0.2)

## 2024-06-08 LAB — CMP (CANCER CENTER ONLY)
ALT: 8 U/L (ref 0–44)
AST: 15 U/L (ref 15–41)
Albumin: 3.8 g/dL (ref 3.5–5.0)
Alkaline Phosphatase: 202 U/L — ABNORMAL HIGH (ref 38–126)
Anion gap: 8 (ref 5–15)
BUN: 6 mg/dL — ABNORMAL LOW (ref 8–23)
CO2: 24 mmol/L (ref 22–32)
Calcium: 8.8 mg/dL — ABNORMAL LOW (ref 8.9–10.3)
Chloride: 104 mmol/L (ref 98–111)
Creatinine: 0.58 mg/dL (ref 0.44–1.00)
GFR, Estimated: >60 mL/min (ref >60)
Glucose, Bld: 161 mg/dL — ABNORMAL HIGH (ref 70–99)
Potassium: 3.8 mmol/L (ref 3.5–5.1)
Sodium: 136 mmol/L (ref 135–145)
Total Bilirubin: 0.3 mg/dL (ref 0.0–1.2)
Total Protein: 6.5 g/dL (ref 6.5–8.1)

## 2024-06-08 MED ORDER — DEXAMETHASONE SODIUM PHOSPHATE 10 MG/ML IJ SOLN
10.0000 mg | Freq: Once | INTRAMUSCULAR | Status: AC
  Administered 2024-06-08: 10 mg via INTRAVENOUS
  Filled 2024-06-08: qty 1

## 2024-06-08 MED ORDER — HEPARIN SOD (PORK) LOCK FLUSH 100 UNIT/ML IV SOLN
250.0000 [IU] | Freq: Once | INTRAVENOUS | Status: AC | PRN
  Administered 2024-06-08: 250 [IU]

## 2024-06-08 MED ORDER — SODIUM CHLORIDE 0.9 % IV SOLN: INTRAVENOUS | Status: DC

## 2024-06-08 MED ORDER — LEUCOVORIN CALCIUM INJECTION 350 MG
400.0000 mg/m2 | Freq: Once | INTRAVENOUS | Status: AC
  Administered 2024-06-08: 788 mg via INTRAVENOUS
  Filled 2024-06-08: qty 25

## 2024-06-08 MED ORDER — FAMOTIDINE IN NACL 20-0.9 MG/50ML-% IV SOLN
20.0000 mg | Freq: Once | INTRAVENOUS | Status: AC
  Administered 2024-06-08: 20 mg via INTRAVENOUS
  Filled 2024-06-08: qty 50

## 2024-06-08 MED ORDER — SODIUM CHLORIDE 0.9 % IV SOLN
50.0000 mg/m2 | Freq: Once | INTRAVENOUS | Status: AC
  Administered 2024-06-08: 98.9 mg via INTRAVENOUS
  Filled 2024-06-08: qty 23

## 2024-06-08 MED ORDER — PALONOSETRON HCL INJECTION 0.25 MG/5ML
0.2500 mg | Freq: Once | INTRAVENOUS | Status: AC
  Administered 2024-06-08: 0.25 mg via INTRAVENOUS
  Filled 2024-06-08: qty 5

## 2024-06-08 MED ORDER — SODIUM CHLORIDE 0.9% FLUSH: 3.0000 mL | INTRAVENOUS | Status: DC | PRN

## 2024-06-08 MED ORDER — SODIUM CHLORIDE 0.9% FLUSH
10.0000 mL | Freq: Once | INTRAVENOUS | Status: AC
  Administered 2024-06-08: 10 mL

## 2024-06-08 MED ORDER — SODIUM CHLORIDE 0.9 % IV SOLN
4500.0000 mg | INTRAVENOUS | Status: DC
  Administered 2024-06-08: 4500 mg via INTRAVENOUS
  Filled 2024-06-08: qty 90

## 2024-06-08 MED ORDER — ATROPINE SULFATE 1 MG/ML IV SOLN
0.5000 mg | Freq: Once | INTRAVENOUS | Status: AC | PRN
  Administered 2024-06-08: 0.5 mg via INTRAVENOUS
  Filled 2024-06-08: qty 1

## 2024-06-08 NOTE — Patient Instructions (Signed)
 [facility] - A DEPT OF [facility]  Discharge Instructions: Thank you for choosing [facility] to provide your oncology and hematology care.   If you have a lab appointment with the Cancer Center, please go directly to the Cancer Center and check in at the registration area.   Wear comfortable clothing and clothing appropriate for easy access to any Portacath or PICC line.   We strive to give you quality time with your provider. You may need to reschedule your appointment if you arrive late (15 or more minutes).  Arriving late affects you and other patients whose appointments are after yours.  Also, if you miss three or more appointments without notifying the office, you may be dismissed from the clinic at the provider's discretion.      For prescription refill requests, have your pharmacy contact our office and allow 72 hours for refills to be completed.    Today you received the following chemotherapy and/or immunotherapy agents: Ininotecan liposome, Leucovorin , 5FU      To help prevent nausea and vomiting after your treatment, we encourage you to take your nausea medication as directed.  BELOW ARE SYMPTOMS THAT SHOULD BE REPORTED IMMEDIATELY: *FEVER GREATER THAN 100.4 F (38 C) OR HIGHER *CHILLS OR SWEATING *NAUSEA AND VOMITING THAT IS NOT CONTROLLED WITH YOUR NAUSEA MEDICATION *UNUSUAL SHORTNESS OF BREATH *UNUSUAL BRUISING OR BLEEDING *URINARY PROBLEMS (pain or burning when urinating, or frequent urination) *BOWEL PROBLEMS (unusual diarrhea, constipation, pain near the anus) TENDERNESS IN MOUTH AND THROAT WITH OR WITHOUT PRESENCE OF ULCERS (sore throat, sores in mouth, or a toothache) UNUSUAL RASH, SWELLING OR PAIN  UNUSUAL VAGINAL DISCHARGE OR ITCHING   Items with * indicate a potential emergency and should be followed up as soon as possible or go to the Emergency Department if any problems should occur.  Please show the CHEMOTHERAPY  ALERT CARD or IMMUNOTHERAPY ALERT CARD at check-in to the Emergency Department and triage nurse.  Should you have questions after your visit or need to cancel or reschedule your appointment, please contact [facility] - A DEPT OF [facility]  Dept: [phone]  and follow the prompts.  Office hours are 8:00 a.m. to 4:30 p.m. Monday - Friday. Please note that voicemails left after 4:00 p.m. may not be returned until the following business day.  We are closed weekends and major holidays. You have access to a nurse at all times for urgent questions. Please call the main number to the clinic Dept: [phone] and follow the prompts.   For any non-urgent questions, you may also contact your provider using MyChart. We now offer e-Visits for anyone 93 and older to request care online for non-urgent symptoms. For details visit [email or web address].   Also download the MyChart app! Go to the app store, search MyChart, open the app, select [place], and log in with your MyChart username and password.

## 2024-06-08 NOTE — Progress Notes (Signed)
 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 Folfirinox discontinued after first cycle due to reaction. She is currently receiving Nalirifox q28d. Oxaliplatin  stopped due to neuropathy.   Met with patient in infusion. She is doing well today. Reports increased variety of foods now that cold sensitivity has resolved. Patient reports lingering fatigue and nausea 4 days after treatment. Appetite is not great, but she pushes nutrition and has gained weight. She denies vomiting, diarrhea, constipation.    Medications: reviewed   Labs: glucose 161, BUN 6  Anthropometrics: Wt 169 lb 11.2 oz today - increased  5/28 - 167 lb 8 oz 5/14 - 167 lb 6.4 oz   NUTRITION DIAGNOSIS: Unintended wt loss - stable     INTERVENTION:  Encourage high calorie high protein foods for wt maintenance     MONITORING, EVALUATION, GOAL: wt trends, intake   NEXT VISIT: To be scheduled as needed

## 2024-06-09 LAB — CANCER ANTIGEN 19-9: CA 19-9: 292 U/mL — ABNORMAL HIGH (ref 0–35)

## 2024-06-10 ENCOUNTER — Inpatient Hospital Stay

## 2024-06-10 VITALS — BP 93/64 | HR 63 | Temp 99.0°F | Resp 16

## 2024-06-10 DIAGNOSIS — C251 Malignant neoplasm of body of pancreas: Secondary | ICD-10-CM | POA: Diagnosis not present

## 2024-06-10 DIAGNOSIS — C259 Malignant neoplasm of pancreas, unspecified: Secondary | ICD-10-CM | POA: Diagnosis not present

## 2024-06-10 DIAGNOSIS — Z5111 Encounter for antineoplastic chemotherapy: Secondary | ICD-10-CM | POA: Diagnosis not present

## 2024-06-10 DIAGNOSIS — C787 Secondary malignant neoplasm of liver and intrahepatic bile duct: Secondary | ICD-10-CM | POA: Diagnosis not present

## 2024-06-10 DIAGNOSIS — Z5189 Encounter for other specified aftercare: Secondary | ICD-10-CM | POA: Diagnosis not present

## 2024-06-10 MED ORDER — PEGFILGRASTIM INJECTION 6 MG/0.6ML ~~LOC~~
6.0000 mg | PREFILLED_SYRINGE | Freq: Once | SUBCUTANEOUS | Status: AC
  Administered 2024-06-10: 6 mg via SUBCUTANEOUS
  Filled 2024-06-10: qty 0.6

## 2024-06-10 MED ORDER — SODIUM CHLORIDE 0.9% FLUSH
10.0000 mL | INTRAVENOUS | Status: DC | PRN
  Administered 2024-06-10: 10 mL

## 2024-06-10 MED ORDER — HEPARIN SOD (PORK) LOCK FLUSH 100 UNIT/ML IV SOLN
500.0000 [IU] | Freq: Once | INTRAVENOUS | Status: AC | PRN
  Administered 2024-06-10: 500 [IU]

## 2024-06-15 ENCOUNTER — Inpatient Hospital Stay

## 2024-06-15 DIAGNOSIS — Z5111 Encounter for antineoplastic chemotherapy: Secondary | ICD-10-CM | POA: Diagnosis not present

## 2024-06-15 DIAGNOSIS — C787 Secondary malignant neoplasm of liver and intrahepatic bile duct: Secondary | ICD-10-CM | POA: Diagnosis not present

## 2024-06-15 DIAGNOSIS — C251 Malignant neoplasm of body of pancreas: Secondary | ICD-10-CM | POA: Diagnosis not present

## 2024-06-15 DIAGNOSIS — Z5189 Encounter for other specified aftercare: Secondary | ICD-10-CM | POA: Diagnosis not present

## 2024-06-15 DIAGNOSIS — Z170 Estrogen receptor positive status [ER+]: Secondary | ICD-10-CM

## 2024-06-15 MED ORDER — SODIUM CHLORIDE 0.9% FLUSH
10.0000 mL | Freq: Once | INTRAVENOUS | Status: AC
  Administered 2024-06-15: 10 mL

## 2024-06-15 MED ORDER — HEPARIN SOD (PORK) LOCK FLUSH 100 UNIT/ML IV SOLN
500.0000 [IU] | Freq: Once | INTRAVENOUS | Status: AC
  Administered 2024-06-15: 500 [IU]

## 2024-06-20 ENCOUNTER — Encounter: Payer: Self-pay | Admitting: Nurse Practitioner

## 2024-06-20 ENCOUNTER — Encounter: Payer: Self-pay | Admitting: Hematology

## 2024-06-20 NOTE — Assessment & Plan Note (Signed)
-  stage IV with liver mets, MSS, KRAS G12R mutation (+) -diagnosed in 10/2023 -Patient has a history of breast cancer. She presents with dyspnea, chest discomfort, decreased appetite, and weight loss. She also reports new onset back pain. -CT showed a 2.9cm mass in pancreatic neck/body, and multiple liver mets, liver biopsy confirmed adenocarcinoma  -I reviewed the aggressive nature of pancreatic cancer, and the incurable nature of her disease due to diffuse liver metastasis. -she start first line chemo Nalirifox chemotherapy regimen on 11/12, with dose reduction for first cycle due to poor PS -Performed molecular testing on tumor tissue to identify potential targeted therapy options, unfortunately no actionable mutations detected  -She underwent genetic testing and came back negative  -restaging CT 04/20/2024 showed mild improvement, chemo changed to 5-fu and liposomal irinotecan  in April 2025

## 2024-06-21 ENCOUNTER — Encounter: Payer: Self-pay | Admitting: Hematology

## 2024-06-21 ENCOUNTER — Inpatient Hospital Stay

## 2024-06-21 ENCOUNTER — Other Ambulatory Visit: Payer: Self-pay

## 2024-06-21 ENCOUNTER — Other Ambulatory Visit (HOSPITAL_COMMUNITY): Payer: Self-pay

## 2024-06-21 ENCOUNTER — Inpatient Hospital Stay (HOSPITAL_BASED_OUTPATIENT_CLINIC_OR_DEPARTMENT_OTHER): Admitting: Hematology

## 2024-06-21 VITALS — BP 112/68 | HR 83 | Temp 98.2°F | Resp 16 | Ht 61.5 in | Wt 169.2 lb

## 2024-06-21 DIAGNOSIS — Z5189 Encounter for other specified aftercare: Secondary | ICD-10-CM | POA: Diagnosis not present

## 2024-06-21 DIAGNOSIS — C251 Malignant neoplasm of body of pancreas: Secondary | ICD-10-CM

## 2024-06-21 DIAGNOSIS — C259 Malignant neoplasm of pancreas, unspecified: Secondary | ICD-10-CM | POA: Diagnosis not present

## 2024-06-21 DIAGNOSIS — C787 Secondary malignant neoplasm of liver and intrahepatic bile duct: Secondary | ICD-10-CM | POA: Diagnosis not present

## 2024-06-21 DIAGNOSIS — Z170 Estrogen receptor positive status [ER+]: Secondary | ICD-10-CM

## 2024-06-21 DIAGNOSIS — Z5111 Encounter for antineoplastic chemotherapy: Secondary | ICD-10-CM | POA: Diagnosis not present

## 2024-06-21 LAB — CBC WITH DIFFERENTIAL (CANCER CENTER ONLY)
Abs Immature Granulocytes: 0.09 10*3/uL — ABNORMAL HIGH (ref 0.00–0.07)
Basophils Absolute: 0 10*3/uL (ref 0.0–0.1)
Basophils Relative: 1 %
Eosinophils Absolute: 0.1 10*3/uL (ref 0.0–0.5)
Eosinophils Relative: 1 %
HCT: 35.7 % — ABNORMAL LOW (ref 36.0–46.0)
Hemoglobin: 11.6 g/dL — ABNORMAL LOW (ref 12.0–15.0)
Immature Granulocytes: 2 %
Lymphocytes Relative: 19 %
Lymphs Abs: 1 10*3/uL (ref 0.7–4.0)
MCH: 27.8 pg (ref 26.0–34.0)
MCHC: 32.5 g/dL (ref 30.0–36.0)
MCV: 85.6 fL (ref 80.0–100.0)
Monocytes Absolute: 0.6 10*3/uL (ref 0.1–1.0)
Monocytes Relative: 11 %
Neutro Abs: 3.6 10*3/uL (ref 1.7–7.7)
Neutrophils Relative %: 66 %
Platelet Count: 202 10*3/uL (ref 150–400)
RBC: 4.17 MIL/uL (ref 3.87–5.11)
RDW: 16.8 % — ABNORMAL HIGH (ref 11.5–15.5)
WBC Count: 5.5 10*3/uL (ref 4.0–10.5)
nRBC: 0 % (ref 0.0–0.2)

## 2024-06-21 LAB — CMP (CANCER CENTER ONLY)
ALT: 9 U/L (ref 0–44)
AST: 16 U/L (ref 15–41)
Albumin: 3.7 g/dL (ref 3.5–5.0)
Alkaline Phosphatase: 217 U/L — ABNORMAL HIGH (ref 38–126)
Anion gap: 6 (ref 5–15)
BUN: 5 mg/dL — ABNORMAL LOW (ref 8–23)
CO2: 26 mmol/L (ref 22–32)
Calcium: 8.7 mg/dL — ABNORMAL LOW (ref 8.9–10.3)
Chloride: 106 mmol/L (ref 98–111)
Creatinine: 0.52 mg/dL (ref 0.44–1.00)
GFR, Estimated: >60 mL/min (ref >60)
Glucose, Bld: 143 mg/dL — ABNORMAL HIGH (ref 70–99)
Potassium: 3.6 mmol/L (ref 3.5–5.1)
Sodium: 138 mmol/L (ref 135–145)
Total Bilirubin: 0.3 mg/dL (ref 0.0–1.2)
Total Protein: 6.3 g/dL — ABNORMAL LOW (ref 6.5–8.1)

## 2024-06-21 MED ORDER — FAMOTIDINE IN NACL 20-0.9 MG/50ML-% IV SOLN
20.0000 mg | Freq: Once | INTRAVENOUS | Status: AC
  Administered 2024-06-21: 20 mg via INTRAVENOUS
  Filled 2024-06-21: qty 50

## 2024-06-21 MED ORDER — HEPARIN SOD (PORK) LOCK FLUSH 100 UNIT/ML IV SOLN
250.0000 [IU] | Freq: Once | INTRAVENOUS | Status: DC | PRN
Start: 2024-06-21 — End: 2024-06-21

## 2024-06-21 MED ORDER — GABAPENTIN 300 MG PO CAPS
300.0000 mg | ORAL_CAPSULE | Freq: Two times a day (BID) | ORAL | 1 refills | Status: DC
  Filled 2024-06-21: qty 60, 30d supply, fill #0

## 2024-06-21 MED ORDER — SODIUM CHLORIDE 0.9 % IV SOLN
50.0000 mg/m2 | Freq: Once | INTRAVENOUS | Status: AC
  Administered 2024-06-21: 98.9 mg via INTRAVENOUS
  Filled 2024-06-21: qty 23

## 2024-06-21 MED ORDER — SODIUM CHLORIDE 0.9% FLUSH
10.0000 mL | Freq: Once | INTRAVENOUS | Status: AC
  Administered 2024-06-21: 10 mL

## 2024-06-21 MED ORDER — LEUCOVORIN CALCIUM INJECTION 350 MG
400.0000 mg/m2 | Freq: Once | INTRAVENOUS | Status: AC
  Administered 2024-06-21: 788 mg via INTRAVENOUS
  Filled 2024-06-21: qty 39.4

## 2024-06-21 MED ORDER — ATROPINE SULFATE 1 MG/ML IV SOLN
0.4000 mg | Freq: Once | INTRAVENOUS | Status: AC
  Administered 2024-06-21: 0.4 mg via INTRAVENOUS
  Filled 2024-06-21: qty 1

## 2024-06-21 MED ORDER — PALONOSETRON HCL INJECTION 0.25 MG/5ML
0.2500 mg | Freq: Once | INTRAVENOUS | Status: AC
  Administered 2024-06-21: 0.25 mg via INTRAVENOUS
  Filled 2024-06-21: qty 5

## 2024-06-21 MED ORDER — POTASSIUM CHLORIDE CRYS ER 10 MEQ PO TBCR
10.0000 meq | EXTENDED_RELEASE_TABLET | Freq: Two times a day (BID) | ORAL | 1 refills | Status: DC
  Filled 2024-06-21: qty 60, 30d supply, fill #0

## 2024-06-21 MED ORDER — SODIUM CHLORIDE 0.9% FLUSH
3.0000 mL | INTRAVENOUS | Status: DC | PRN
  Administered 2024-06-21: 3 mL

## 2024-06-21 MED ORDER — DULOXETINE HCL 20 MG PO CPEP
20.0000 mg | ORAL_CAPSULE | Freq: Every day | ORAL | 0 refills | Status: DC
  Filled 2024-06-21 (×2): qty 30, 30d supply, fill #0

## 2024-06-21 MED ORDER — SODIUM CHLORIDE 0.9 % IV SOLN
4500.0000 mg | INTRAVENOUS | Status: DC
  Administered 2024-06-21: 4500 mg via INTRAVENOUS
  Filled 2024-06-21: qty 90

## 2024-06-21 MED ORDER — DEXAMETHASONE SODIUM PHOSPHATE 10 MG/ML IJ SOLN
10.0000 mg | Freq: Once | INTRAMUSCULAR | Status: AC
  Administered 2024-06-21: 10 mg via INTRAVENOUS

## 2024-06-21 MED ORDER — SODIUM CHLORIDE 0.9 % IV SOLN: Freq: Once | INTRAVENOUS | Status: AC

## 2024-06-21 NOTE — Progress Notes (Signed)
 [facility]   [phone] [phone]   Clinic Follow up Note   Patient Care Team: [name], [name] as PCP - General (Nurse Practitioner) [name], MD as Consulting Physician (Hematology and Oncology) [name], MD as Consulting Physician (Radiation Oncology) [name], MD as Consulting Physician (General Surgery) [name], MD as Consulting Physician (Hematology and Oncology)  Date of Service:  06/21/2024  CHIEF COMPLAINT: f/u of pancreatic cancer  CURRENT THERAPY:  FOLFIRI with liposomal irinotecan  every 2 weeks  Oncology History   Pancreatic cancer (HCC) -stage IV with liver mets, MSS, KRAS G12R mutation (+) -diagnosed in 10/2023 -Patient has a history of breast cancer. She presents with dyspnea, chest discomfort, decreased appetite, and weight loss. She also reports new onset back pain. -CT showed a 2.9cm mass in pancreatic neck/body, and multiple liver mets, liver biopsy confirmed adenocarcinoma  -I reviewed the aggressive nature of pancreatic cancer, and the incurable nature of her disease due to diffuse liver metastasis. -she start first line chemo Nalirifox chemotherapy regimen on 11/12, with dose reduction for first cycle due to poor PS -Performed molecular testing on tumor tissue to identify potential targeted therapy options, unfortunately no actionable mutations detected  -She underwent genetic testing and came back negative  -restaging CT 04/20/2024 showed mild improvement, chemo changed to 5-fu and liposomal irinotecan  in April 2025  Assessment & Plan Pancreatic cancer Pancreatic cancer with a significant reduction in tumor markers from 24,000 to 292, indicating a positive response to treatment. She is on maintenance therapy and reports a good quality of life, managing side effects effectively. The last scan was on April 23, with the next scan planned for late July or early August. - Order scan for late July or early  August - Continue maintenance therapy - Schedule follow-up in two weeks  Peripheral neuropathy Peripheral neuropathy causing significant discomfort, described as tingling. Managed with gabapentin , but she has been out of medication for four days. Discussed alternative options including Lyrica and Cymbalta. She prefers to try Cymbalta in combination with gabapentin . Gabapentin  has not caused drowsiness, allowing for dose increase at night. - Prescribe Cymbalta starting at 20 mg, with potential to increase to 60 mg - Refill gabapentin  prescription with instructions to increase dose at night if not causing drowsiness  Hypokalemia Potassium levels are slightly low at 3.6. She has been taking potassium chloride  (KCL) 10 mEq daily. Potassium levels are normal but on the low end. - Adjust potassium chloride  intake to every other day, 3-4 times a week - Refill potassium chloride  prescription  Hypocalcemia Calcium  levels are low. She is not currently taking calcium  or vitamin D  supplements. Advised to start supplementation to address low calcium  levels. - Advise taking calcium  and vitamin D  supplements  Plan - Lab reviewed, adequate for treatment, will proceed treatment today and continue every 2 weeks - Follow-up in 2 weeks - Plan to repeat a CT scan in early August - I increased the gabapentin , and add on Cymbalta for her neuropathy today   SUMMARY OF ONCOLOGIC HISTORY: Oncology History  Malignant neoplasm of overlapping sites of left breast in female, estrogen receptor positive (HCC)  08/04/2018 Initial Diagnosis   Screening mammogram detected left breast mass at 9:30 position 1.2 x 1.2 x 1.1 cm.  Closer to the nipple at 9:30 position 0.8 cm lesion span 3.6 cm and a 1.5 cm apart.  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 at 9:30 position 2 cm from nipple, at 1 cm from nipple PASH, lymph node benign, ER 90%, PR 95%, Ki-67 20%,  HER-2 negative by IHC 1+, T1CN0 stage Ia AJCC 8   09/29/2018 Surgery   Left lumpectomy: Grade 3 IDC, 1.7 cm, margins negative, lymphovascular invasion present, 0/2 lymph nodes negative, T1c N0 stage Ia   10/06/2018 Cancer Staging   Staging form: Breast, AJCC 8th Edition - Pathologic: Stage IA (pT1c, pN0(sn), cM0, G3, ER+, PR+, HER2-) - Signed by [name], MD on 10/06/2018   10/22/2018 Oncotype testing   Oncotype score 13: Distant recurrence at 9 years with hormone therapy alone 4%   11/12/2018 - 12/27/2018 Radiation Therapy   Adjuvant radiation therapy   12/27/2018 -  Anti-estrogen oral therapy   Antiestrogen therapy with letrozole  2.5 mg daily   11/30/2023 Genetic Testing   Negative genetic testing on the CancerNext-Expanded+RNAinsight panel.  VUS identified in KIT c.2083G>C and PMS2 c.-1C>A.  The report date is 11/30/2023.  The CancerNext-Expanded gene panel offered by [facility] and includes sequencing, rearrangement, and RNA analysis for the following 76 genes: AIP, ALK, APC, ATM, AXIN2, BAP1, BARD1, BMPR1A, BRCA1, BRCA2, BRIP1, CDC73, CDH1, CDK4, CDKN1B, CDKN2A, CEBPA, CHEK2, CTNNA1, DDX41, DICER1, ETV6, FH, FLCN, GATA2, LZTR1, MAX, MBD4, MEN1, MET, MLH1, MSH2, MSH3, MSH6, MUTYH, NF1, NF2, NTHL1, PALB2, PHOX2B, PMS2, POT1, PRKAR1A, PTCH1, PTEN, RAD51C, RAD51D, RB1, RET, RUNX1, SDHA, SDHAF2, SDHB, SDHC, SDHD, SMAD4, SMARCA4, SMARCB1, SMARCE1, STK11, SUFU, TMEM127, TP53, TSC1, TSC2, VHL, and WT1 (sequencing and deletion/duplication); EGFR, HOXB13, KIT, MITF, PDGFRA, POLD1, and POLE (sequencing only); EPCAM and GREM1 (deletion/duplication only).    Pancreatic cancer (HCC)  11/03/2023 Initial Diagnosis   Pancreatic cancer (HCC)   11/11/2023 -  Chemotherapy   Patient is on Treatment Plan : PANCREAS NALIRIFOX D1, 15 Q28D     11/30/2023 Genetic Testing   Negative genetic testing on the CancerNext-Expanded+RNAinsight panel.  VUS identified in KIT c.2083G>C and PMS2 c.-1C>A.  The report  date is 11/30/2023.  The CancerNext-Expanded gene panel offered by [facility] and includes sequencing, rearrangement, and RNA analysis for the following 76 genes: AIP, ALK, APC, ATM, AXIN2, BAP1, BARD1, BMPR1A, BRCA1, BRCA2, BRIP1, CDC73, CDH1, CDK4, CDKN1B, CDKN2A, CEBPA, CHEK2, CTNNA1, DDX41, DICER1, ETV6, FH, FLCN, GATA2, LZTR1, MAX, MBD4, MEN1, MET, MLH1, MSH2, MSH3, MSH6, MUTYH, NF1, NF2, NTHL1, PALB2, PHOX2B, PMS2, POT1, PRKAR1A, PTCH1, PTEN, RAD51C, RAD51D, RB1, RET, RUNX1, SDHA, SDHAF2, SDHB, SDHC, SDHD, SMAD4, SMARCA4, SMARCB1, SMARCE1, STK11, SUFU, TMEM127, TP53, TSC1, TSC2, VHL, and WT1 (sequencing and deletion/duplication); EGFR, HOXB13, KIT, MITF, PDGFRA, POLD1, and POLE (sequencing only); EPCAM and GREM1 (deletion/duplication only).    01/29/2024 Imaging   CT abdomen and pelvis with contrast  IMPRESSION: 1. Diminished size of a hypodense mass of the pancreatic neck, consistent with treatment response. 2. Diminished size and hypodensity of numerous liver metastases, with new overlying pseudocirrhotic capsular retraction, consistent with treatment response. 3. 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 4. Unchanged prominent, nonspecific retroperitoneal lymph nodes.  5. Underlying hepatic steatosis.      Discussed the use of AI scribe software for clinical note transcription with the patient, who gave verbal consent to proceed.  History of Present Illness Ashley Clark is a 63 year old female with pancreatic cancer who presents for follow-up.  She experiences significant neuropathy with tingling sensations and has been taking gabapentin  100 mg twice daily, though she has been out of medication for four days. She is on potassium chloride   10 mEq daily for low potassium levels, currently at 3.6 mmol/L.  She is on Eliquis  twice a day without bleeding issues. She manages bowel movements by eating every three hours, reducing diarrhea and constipation, and  maintains her weight with a diet rich in fruits and vegetables.  She has a PICC line for chemotherapy, changed weekly, and receives chemotherapy every two weeks. The PICC line is functioning well.  She experiences sleep disturbances and occasional nighttime awakenings. Bowel regularity has improved with dietary adjustments.     All other systems were reviewed with the patient and are negative.  MEDICAL HISTORY:  Past Medical History:  Diagnosis Date   Acute pulmonary embolism (HCC) 10/29/2023   Allergy    Back pain    Cancer (HCC) 2019   left breast cancer-last radiation Dec.2019   Diabetes mellitus without complication [facility])    Medical history non-contributory    Personal history of radiation therapy    Port-A-Cath in place - removed 12-11-2023 due to port-a-cath infection. 11/13/2023   Prediabetes    Tiredness    Vitamin D  deficiency     SURGICAL HISTORY: Past Surgical History:  Procedure Laterality Date   BREAST BIOPSY Left 08/04/2018   x3   BREAST LUMPECTOMY Left    09-29-18   BREAST LUMPECTOMY WITH RADIOACTIVE SEED AND SENTINEL LYMPH NODE BIOPSY Left 09/29/2018   Procedure: LEFT BREAST LUMPECTOMY WITH RADIOACTIVE SEED AND SENTINEL LYMPH NODE BIOPSY;  Surgeon: [name], MD;  Location:  SURGERY CENTER;  Service: General;  Laterality: Left;   COLONOSCOPY  2016   IR IMAGING GUIDED PORT INSERTION  11/03/2023   IR PATIENT EVAL TECH 0-60 MINS  12/18/2023   IR PATIENT EVAL TECH 0-60 MINS  12/24/2023   IR PATIENT EVAL TECH 0-60 MINS  12/28/2023   IR PATIENT EVAL TECH 0-60 MINS  01/04/2024   IR PATIENT EVAL TECH 0-60 MINS  01/13/2024   IR PATIENT EVAL TECH 0-60 MINS  01/22/2024   IR RADIOLOGIST EVAL & MGMT  12/15/2023   IR REMOVAL TUN ACCESS W/ PORT W/O FL MOD SED  12/11/2023   POLYPECTOMY     WISDOM TOOTH EXTRACTION      I have reviewed the social history and family history with the patient and they are unchanged from previous note.  ALLERGIES:  is allergic  to irinotecan  liposome.  MEDICATIONS:  Current Outpatient Medications  Medication Sig Dispense Refill   DULoxetine (CYMBALTA) 20 MG capsule Take 1 capsule (20 mg total) by mouth daily. 30 capsule 0   gabapentin  (NEURONTIN ) 300 MG capsule Take 1 capsule (300 mg total) by mouth 2 (two) times daily. 60 capsule 1   apixaban  (ELIQUIS ) 5 MG TABS tablet Take 1 tablet (5 mg total) by mouth 2 (two) times daily. 60 tablet 5   cetirizine (ZYRTEC) 10 MG chewable tablet Chew 10 mg by mouth daily.     lidocaine -prilocaine  (EMLA ) cream Apply to affected area once (Patient taking differently: Apply 1 Application topically See admin instructions. Apply to affected area for port access) 30 g 3   ondansetron  (ZOFRAN ) 8 MG tablet Take 1 tablet (8 mg total) by mouth every 8 (eight) hours as needed for nausea or vomiting. Start on the third day after irinotecan  30 tablet 1   potassium chloride  (KLOR-CON  M) 10 MEQ tablet Take 1 tablet (10 mEq total) by mouth 2 (two) times daily. 60 tablet 1   prochlorperazine  (COMPAZINE ) 10 MG tablet Take 1 tablet (10 mg total) by mouth every 6 (  six) hours as needed for nausea or vomiting. 30 tablet 1   No current facility-administered medications for this visit.   Facility-Administered Medications Ordered in Other Visits  Medication Dose Route Frequency Provider Last Rate Last Admin   fluorouracil  (ADRUCIL ) 4,500 mg in sodium chloride  0.9 % 60 mL chemo infusion  4,500 mg Intravenous 1 day or 1 dose [name], MD   Infusion Verify at 06/21/24 1536   heparin  lock flush 100 unit/mL  500 Units Intracatheter Once PRN [name], MD       heparin  lock flush 100 unit/mL  250 Units Intracatheter Once PRN [name], MD       sodium chloride  flush (NS) 0.9 % injection 10 mL  10 mL Intracatheter PRN [name], MD       sodium chloride  flush (NS) 0.9 % injection 3 mL  3 mL Intracatheter PRN [name], MD   3 mL at 06/21/24 1522    PHYSICAL EXAMINATION: ECOG PERFORMANCE STATUS: 2 -  Symptomatic, <50% confined to bed  Vitals:   06/21/24 1110  BP: 112/68  Pulse: 83  Resp: 16  Temp: 98.2 F (36.8 C)  SpO2: 97%   Wt Readings from Last 3 Encounters:  06/21/24 169 lb 3.2 oz (76.7 kg)  06/08/24 169 lb 11.2 oz (77 kg)  05/25/24 167 lb 8 oz (76 kg)     GENERAL:alert, no distress and comfortable SKIN: skin color, texture, turgor are normal, no rashes or significant lesions EYES: normal, Conjunctiva are pink and non-injected, sclera clear NECK: supple, thyroid normal size, non-tender, without nodularity LYMPH:  no palpable lymphadenopathy in the cervical, axillary  LUNGS: clear to auscultation and percussion with normal breathing effort HEART: regular rate & rhythm and no murmurs and no lower extremity edema ABDOMEN:abdomen soft, non-tender and normal bowel sounds Musculoskeletal:no cyanosis of digits and no clubbing   Physical Exam    LABORATORY DATA:  I have reviewed the data as listed    Latest Ref Rng & Units 06/21/2024   10:28 AM 06/08/2024    8:32 AM 05/25/2024    9:08 AM  CBC  WBC 4.0 - 10.5 K/uL 5.5  5.7  6.1   Hemoglobin 12.0 - 15.0 g/dL 88.3  88.2  87.9   Hematocrit 36.0 - 46.0 % 35.7  36.1  36.6   Platelets 150 - 400 K/uL 202  193  199         Latest Ref Rng & Units 06/21/2024   10:28 AM 06/08/2024    8:32 AM 05/25/2024    9:08 AM  CMP  Glucose 70 - 99 mg/dL 856  838  895   BUN 8 - 23 mg/dL 5  6  7    Creatinine 0.44 - 1.00 mg/dL 9.47  9.41  9.41   Sodium 135 - 145 mmol/L 138  136  138   Potassium 3.5 - 5.1 mmol/L 3.6  3.8  3.9   Chloride 98 - 111 mmol/L 106  104  106   CO2 22 - 32 mmol/L 26  24  27    Calcium  8.9 - 10.3 mg/dL 8.7  8.8  9.2   Total Protein 6.5 - 8.1 g/dL 6.3  6.5  6.7   Total Bilirubin 0.0 - 1.2 mg/dL 0.3  0.3  0.3   Alkaline Phos 38 - 126 U/L 217  202  196   AST 15 - 41 U/L 16  15  15    ALT 0 - 44 U/L 9  8  8  RADIOGRAPHIC STUDIES: I have personally reviewed the radiological images as listed and agreed with the  findings in the report. No results found.    No orders of the defined types were placed in this encounter.  All questions were answered. The patient knows to call the clinic with any problems, questions or concerns. No barriers to learning was detected. The total time spent in the appointment was 25 minutes, including review of chart and various tests results, discussions about plan of care and coordination of care plan     Onita Mattock, MD 06/21/2024

## 2024-06-22 ENCOUNTER — Ambulatory Visit: Admitting: Hematology

## 2024-06-22 ENCOUNTER — Ambulatory Visit

## 2024-06-22 ENCOUNTER — Encounter

## 2024-06-22 LAB — CANCER ANTIGEN 19-9: CA 19-9: 249 U/mL — ABNORMAL HIGH (ref 0–35)

## 2024-06-23 ENCOUNTER — Inpatient Hospital Stay

## 2024-06-23 VITALS — BP 107/75 | HR 75 | Temp 99.2°F | Resp 19

## 2024-06-23 DIAGNOSIS — Z5189 Encounter for other specified aftercare: Secondary | ICD-10-CM | POA: Diagnosis not present

## 2024-06-23 DIAGNOSIS — C251 Malignant neoplasm of body of pancreas: Secondary | ICD-10-CM

## 2024-06-23 DIAGNOSIS — Z170 Estrogen receptor positive status [ER+]: Secondary | ICD-10-CM

## 2024-06-23 DIAGNOSIS — C787 Secondary malignant neoplasm of liver and intrahepatic bile duct: Secondary | ICD-10-CM | POA: Diagnosis not present

## 2024-06-23 DIAGNOSIS — Z5111 Encounter for antineoplastic chemotherapy: Secondary | ICD-10-CM | POA: Diagnosis not present

## 2024-06-23 MED ORDER — PEGFILGRASTIM INJECTION 6 MG/0.6ML ~~LOC~~
6.0000 mg | PREFILLED_SYRINGE | Freq: Once | SUBCUTANEOUS | Status: AC
  Administered 2024-06-23: 6 mg via SUBCUTANEOUS
  Filled 2024-06-23: qty 0.6

## 2024-06-23 MED ORDER — HEPARIN SOD (PORK) LOCK FLUSH 100 UNIT/ML IV SOLN: 500.0000 [IU] | Freq: Once | INTRAVENOUS | Status: AC

## 2024-06-23 MED ORDER — SODIUM CHLORIDE 0.9% FLUSH: 10.0000 mL | Freq: Once | INTRAVENOUS | Status: AC

## 2024-06-23 MED ORDER — HEPARIN SOD (PORK) LOCK FLUSH 100 UNIT/ML IV SOLN
500.0000 [IU] | Freq: Once | INTRAVENOUS | Status: AC | PRN
  Administered 2024-06-23: 500 [IU]

## 2024-06-23 MED ORDER — SODIUM CHLORIDE 0.9% FLUSH
10.0000 mL | INTRAVENOUS | Status: DC | PRN
Start: 2024-06-23 — End: 2024-06-23
  Administered 2024-06-23: 10 mL

## 2024-06-24 ENCOUNTER — Encounter

## 2024-06-29 ENCOUNTER — Inpatient Hospital Stay: Attending: Hematology

## 2024-06-29 DIAGNOSIS — Z170 Estrogen receptor positive status [ER+]: Secondary | ICD-10-CM

## 2024-06-29 DIAGNOSIS — Z5189 Encounter for other specified aftercare: Secondary | ICD-10-CM | POA: Insufficient documentation

## 2024-06-29 DIAGNOSIS — C787 Secondary malignant neoplasm of liver and intrahepatic bile duct: Secondary | ICD-10-CM | POA: Insufficient documentation

## 2024-06-29 DIAGNOSIS — C251 Malignant neoplasm of body of pancreas: Secondary | ICD-10-CM | POA: Insufficient documentation

## 2024-06-29 DIAGNOSIS — Z5111 Encounter for antineoplastic chemotherapy: Secondary | ICD-10-CM | POA: Diagnosis not present

## 2024-06-29 MED ORDER — SODIUM CHLORIDE 0.9% FLUSH
10.0000 mL | Freq: Once | INTRAVENOUS | Status: AC
  Administered 2024-06-29: 10 mL

## 2024-06-29 MED ORDER — HEPARIN SOD (PORK) LOCK FLUSH 100 UNIT/ML IV SOLN
500.0000 [IU] | Freq: Once | INTRAVENOUS | Status: AC
  Administered 2024-06-29: 500 [IU]

## 2024-07-05 NOTE — Assessment & Plan Note (Signed)
-  stage IV with liver mets, MSS, KRAS G12R mutation (+) -diagnosed in 10/2023 -Patient has a history of breast cancer. She presents with dyspnea, chest discomfort, decreased appetite, and weight loss. She also reports new onset back pain. -CT showed a 2.9cm mass in pancreatic neck/body, and multiple liver mets, liver biopsy confirmed adenocarcinoma  -I reviewed the aggressive nature of pancreatic cancer, and the incurable nature of her disease due to diffuse liver metastasis. -she start first line chemo Nalirifox chemotherapy regimen on 11/12, with dose reduction for first cycle due to poor PS -Performed molecular testing on tumor tissue to identify potential targeted therapy options, unfortunately no actionable mutations detected  -She underwent genetic testing and came back negative  -restaging CT 04/20/2024 showed mild improvement, chemo changed to 5-fu and liposomal irinotecan  in April 2025

## 2024-07-06 ENCOUNTER — Inpatient Hospital Stay

## 2024-07-06 ENCOUNTER — Inpatient Hospital Stay (HOSPITAL_BASED_OUTPATIENT_CLINIC_OR_DEPARTMENT_OTHER): Admitting: Hematology

## 2024-07-06 VITALS — BP 125/70 | HR 55 | Temp 98.2°F | Resp 18 | Wt 169.4 lb

## 2024-07-06 DIAGNOSIS — C251 Malignant neoplasm of body of pancreas: Secondary | ICD-10-CM

## 2024-07-06 DIAGNOSIS — Z5111 Encounter for antineoplastic chemotherapy: Secondary | ICD-10-CM | POA: Diagnosis not present

## 2024-07-06 DIAGNOSIS — C259 Malignant neoplasm of pancreas, unspecified: Secondary | ICD-10-CM | POA: Diagnosis not present

## 2024-07-06 DIAGNOSIS — Z5189 Encounter for other specified aftercare: Secondary | ICD-10-CM | POA: Diagnosis not present

## 2024-07-06 DIAGNOSIS — C787 Secondary malignant neoplasm of liver and intrahepatic bile duct: Secondary | ICD-10-CM | POA: Diagnosis not present

## 2024-07-06 DIAGNOSIS — Z170 Estrogen receptor positive status [ER+]: Secondary | ICD-10-CM

## 2024-07-06 LAB — CBC WITH DIFFERENTIAL (CANCER CENTER ONLY)
Abs Immature Granulocytes: 0.04 K/uL (ref 0.00–0.07)
Basophils Absolute: 0 K/uL (ref 0.0–0.1)
Basophils Relative: 1 %
Eosinophils Absolute: 0.1 K/uL (ref 0.0–0.5)
Eosinophils Relative: 2 %
HCT: 36.5 % (ref 36.0–46.0)
Hemoglobin: 11.8 g/dL — ABNORMAL LOW (ref 12.0–15.0)
Immature Granulocytes: 1 %
Lymphocytes Relative: 30 %
Lymphs Abs: 1 K/uL (ref 0.7–4.0)
MCH: 28.3 pg (ref 26.0–34.0)
MCHC: 32.3 g/dL (ref 30.0–36.0)
MCV: 87.5 fL (ref 80.0–100.0)
Monocytes Absolute: 0.4 K/uL (ref 0.1–1.0)
Monocytes Relative: 12 %
Neutro Abs: 1.9 K/uL (ref 1.7–7.7)
Neutrophils Relative %: 54 %
Platelet Count: 203 K/uL (ref 150–400)
RBC: 4.17 MIL/uL (ref 3.87–5.11)
RDW: 16.5 % — ABNORMAL HIGH (ref 11.5–15.5)
Smear Review: NORMAL
WBC Count: 3.5 K/uL — ABNORMAL LOW (ref 4.0–10.5)
nRBC: 0 % (ref 0.0–0.2)

## 2024-07-06 LAB — CMP (CANCER CENTER ONLY)
ALT: 8 U/L (ref 0–44)
AST: 14 U/L — ABNORMAL LOW (ref 15–41)
Albumin: 3.6 g/dL (ref 3.5–5.0)
Alkaline Phosphatase: 195 U/L — ABNORMAL HIGH (ref 38–126)
Anion gap: 8 (ref 5–15)
BUN: 5 mg/dL — ABNORMAL LOW (ref 8–23)
CO2: 25 mmol/L (ref 22–32)
Calcium: 8.8 mg/dL — ABNORMAL LOW (ref 8.9–10.3)
Chloride: 106 mmol/L (ref 98–111)
Creatinine: 0.71 mg/dL (ref 0.44–1.00)
GFR, Estimated: >60 mL/min (ref >60)
Glucose, Bld: 153 mg/dL — ABNORMAL HIGH (ref 70–99)
Potassium: 3.7 mmol/L (ref 3.5–5.1)
Sodium: 139 mmol/L (ref 135–145)
Total Bilirubin: 0.3 mg/dL (ref 0.0–1.2)
Total Protein: 6.1 g/dL — ABNORMAL LOW (ref 6.5–8.1)

## 2024-07-06 MED ORDER — LEUCOVORIN CALCIUM INJECTION 350 MG
400.0000 mg/m2 | Freq: Once | INTRAVENOUS | Status: AC
  Administered 2024-07-06: 788 mg via INTRAVENOUS
  Filled 2024-07-06: qty 25

## 2024-07-06 MED ORDER — ATROPINE SULFATE 1 MG/ML IV SOLN
0.5000 mg | Freq: Once | INTRAVENOUS | Status: AC | PRN
  Administered 2024-07-06: 0.5 mg via INTRAVENOUS
  Filled 2024-07-06: qty 1

## 2024-07-06 MED ORDER — PALONOSETRON HCL INJECTION 0.25 MG/5ML
0.2500 mg | Freq: Once | INTRAVENOUS | Status: AC
  Administered 2024-07-06: 0.25 mg via INTRAVENOUS
  Filled 2024-07-06: qty 5

## 2024-07-06 MED ORDER — SODIUM CHLORIDE 0.9% FLUSH
10.0000 mL | Freq: Once | INTRAVENOUS | Status: AC
  Administered 2024-07-06: 10 mL

## 2024-07-06 MED ORDER — SODIUM CHLORIDE 0.9 % IV SOLN: Freq: Once | INTRAVENOUS | Status: AC

## 2024-07-06 MED ORDER — DEXAMETHASONE SODIUM PHOSPHATE 10 MG/ML IJ SOLN
10.0000 mg | Freq: Once | INTRAMUSCULAR | Status: AC
  Administered 2024-07-06: 10 mg via INTRAVENOUS
  Filled 2024-07-06: qty 1

## 2024-07-06 MED ORDER — SODIUM CHLORIDE 0.9 % IV SOLN
4500.0000 mg | INTRAVENOUS | Status: DC
  Administered 2024-07-06: 4500 mg via INTRAVENOUS
  Filled 2024-07-06: qty 90

## 2024-07-06 MED ORDER — ATROPINE SULFATE 1 MG/ML IV SOLN: 0.4000 mg | Freq: Once | INTRAVENOUS | Status: DC

## 2024-07-06 MED ORDER — HEPARIN SOD (PORK) LOCK FLUSH 100 UNIT/ML IV SOLN: 500.0000 [IU] | Freq: Once | INTRAVENOUS | Status: DC | PRN

## 2024-07-06 MED ORDER — SODIUM CHLORIDE 0.9% FLUSH: 10.0000 mL | INTRAVENOUS | Status: DC | PRN

## 2024-07-06 MED ORDER — SODIUM CHLORIDE 0.9 % IV SOLN
50.0000 mg/m2 | Freq: Once | INTRAVENOUS | Status: AC
  Administered 2024-07-06: 98.9 mg via INTRAVENOUS
  Filled 2024-07-06: qty 23

## 2024-07-06 MED ORDER — FAMOTIDINE IN NACL 20-0.9 MG/50ML-% IV SOLN
20.0000 mg | Freq: Once | INTRAVENOUS | Status: AC
  Administered 2024-07-06: 20 mg via INTRAVENOUS
  Filled 2024-07-06: qty 50

## 2024-07-06 NOTE — Patient Instructions (Signed)
 [facility] - A DEPT OF [facility]  Discharge Instructions: Thank you for choosing [facility] to provide your oncology and hematology care.   If you have a lab appointment with the Cancer Center, please go directly to the Cancer Center and check in at the registration area.   Wear comfortable clothing and clothing appropriate for easy access to any Portacath or PICC line.   We strive to give you quality time with your provider. You may need to reschedule your appointment if you arrive late (15 or more minutes).  Arriving late affects you and other patients whose appointments are after yours.  Also, if you miss three or more appointments without notifying the office, you may be dismissed from the clinic at the provider's discretion.      For prescription refill requests, have your pharmacy contact our office and allow 72 hours for refills to be completed.    Today you received the following chemotherapy and/or immunotherapy agents: Liposomal Irinotecan, Leucovorin, Fluorouracil.       To help prevent nausea and vomiting after your treatment, we encourage you to take your nausea medication as directed.  BELOW ARE SYMPTOMS THAT SHOULD BE REPORTED IMMEDIATELY: *FEVER GREATER THAN 100.4 F (38 C) OR HIGHER *CHILLS OR SWEATING *NAUSEA AND VOMITING THAT IS NOT CONTROLLED WITH YOUR NAUSEA MEDICATION *UNUSUAL SHORTNESS OF BREATH *UNUSUAL BRUISING OR BLEEDING *URINARY PROBLEMS (pain or burning when urinating, or frequent urination) *BOWEL PROBLEMS (unusual diarrhea, constipation, pain near the anus) TENDERNESS IN MOUTH AND THROAT WITH OR WITHOUT PRESENCE OF ULCERS (sore throat, sores in mouth, or a toothache) UNUSUAL RASH, SWELLING OR PAIN  UNUSUAL VAGINAL DISCHARGE OR ITCHING   Items with * indicate a potential emergency and should be followed up as soon as possible or go to the Emergency Department if any problems should occur.  Please show the  CHEMOTHERAPY ALERT CARD or IMMUNOTHERAPY ALERT CARD at check-in to the Emergency Department and triage nurse.  Should you have questions after your visit or need to cancel or reschedule your appointment, please contact [facility] - A DEPT [facility]  Dept: [phone]  and follow the prompts.  Office hours are 8:00 a.m. to 4:30 p.m. Monday - Friday. Please note that voicemails left after 4:00 p.m. may not be returned until the following business day.  We are closed weekends and major holidays. You have access to a nurse at all times for urgent questions. Please call the main number to the clinic Dept: [phone] and follow the prompts.   For any non-urgent questions, you may also contact your provider using MyChart. We now offer e-Visits for anyone 74 and older to request care online for non-urgent symptoms. For details visit [email or web address].   Also download the MyChart app! Go to the app store, search "MyChart", open the app, select [facility], and log in with your MyChart username and password.

## 2024-07-06 NOTE — Progress Notes (Signed)
 [facility]   [phone] [phone]   Clinic Follow up Note   Patient Care Team: [name], [name] as PCP - General (Nurse Practitioner) [name], MD as Consulting Physician (Hematology and Oncology) [name], MD as Consulting Physician (Radiation Oncology) [name], MD as Consulting Physician (General Surgery) [name], MD as Consulting Physician (Hematology and Oncology)  Date of Service:  07/06/2024  CHIEF COMPLAINT: f/u of pancreatic cancer  CURRENT THERAPY:  Liposomal irinotecan  and 5-FU every 2 weeks  Oncology History   Pancreatic cancer (HCC) -stage IV with liver mets, MSS, KRAS G12R mutation (+) -diagnosed in 10/2023 -Patient has a history of breast cancer. She presents with dyspnea, chest discomfort, decreased appetite, and weight loss. She also reports new onset back pain. -CT showed a 2.9cm mass in pancreatic neck/body, and multiple liver mets, liver biopsy confirmed adenocarcinoma  -I reviewed the aggressive nature of pancreatic cancer, and the incurable nature of her disease due to diffuse liver metastasis. -she start first line chemo Nalirifox chemotherapy regimen on 11/12, with dose reduction for first cycle due to poor PS -Performed molecular testing on tumor tissue to identify potential targeted therapy options, unfortunately no actionable mutations detected  -She underwent genetic testing and came back negative  -restaging CT 04/20/2024 showed mild improvement, chemo changed to 5-fu and liposomal irinotecan  in April 2025   Assessment & Plan Metastatic pancreas cancer -on maintenance chemotherapy, she is tolerating well - Labs reviewed, adequate for treatment, will proceed chemo today - Tumor marker has overall dropped, indicating responding - Will repeat her restaging CT scan in 3 weeks  Chemo induced anemia - Overall mild, continue monitoring  Plan - Lab reviewed, adequate for treatment, will proceed  same dose liposomal irinotecan  and 5-FU and continue every 2 weeks - Follow-up in 2 weeks - Restaging CT scan in 3 weeks, I ordered today     SUMMARY OF ONCOLOGIC HISTORY: Oncology History  Malignant neoplasm of overlapping sites of left breast in female, estrogen receptor positive (HCC)  08/04/2018 Initial Diagnosis   Screening mammogram detected left breast mass at 9:30 position 1.2 x 1.2 x 1.1 cm.  Closer to the nipple at 9:30 position 0.8 cm lesion span 3.6 cm and a 1.5 cm apart.  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 position 2 cm from nipple, at 1 cm from nipple PASH, lymph node benign, ER 90%, PR 95%, Ki-67 20%, HER-2 negative by IHC 1+, T1CN0 stage Ia AJCC 8   09/29/2018 Surgery   Left lumpectomy: Grade 3 IDC, 1.7 cm, margins negative, lymphovascular invasion present, 0/2 lymph nodes negative, T1c N0 stage Ia   10/06/2018 Cancer Staging   Staging form: Breast, AJCC 8th Edition - Pathologic: Stage IA (pT1c, pN0(sn), cM0, G3, ER+, PR+, HER2-) - Signed by [name], MD on 10/06/2018   10/22/2018 Oncotype testing   Oncotype score 13: Distant recurrence at 9 years with hormone therapy alone 4%   11/12/2018 - 12/27/2018 Radiation Therapy   Adjuvant radiation therapy   12/27/2018 -  Anti-estrogen oral therapy   Antiestrogen therapy with letrozole  2.5 mg daily   11/30/2023 Genetic Testing   Negative genetic testing on the CancerNext-Expanded+RNAinsight panel.  VUS identified in KIT c.2083G>C and PMS2 c.-1C>A.  The report date is 11/30/2023.  The CancerNext-Expanded gene panel offered by [facility] and includes sequencing, rearrangement, and RNA analysis for the following 76 genes: AIP,  ALK, APC, ATM, AXIN2, BAP1, BARD1, BMPR1A, BRCA1, BRCA2, BRIP1, CDC73, CDH1, CDK4, CDKN1B, CDKN2A, CEBPA, CHEK2, CTNNA1, DDX41, DICER1, ETV6, FH, FLCN, GATA2, LZTR1, MAX, MBD4, MEN1, MET, MLH1, MSH2, MSH3, MSH6, MUTYH, NF1,  NF2, NTHL1, PALB2, PHOX2B, PMS2, POT1, PRKAR1A, PTCH1, PTEN, RAD51C, RAD51D, RB1, RET, RUNX1, SDHA, SDHAF2, SDHB, SDHC, SDHD, SMAD4, SMARCA4, SMARCB1, SMARCE1, STK11, SUFU, TMEM127, TP53, TSC1, TSC2, VHL, and WT1 (sequencing and deletion/duplication); EGFR, HOXB13, KIT, MITF, PDGFRA, POLD1, and POLE (sequencing only); EPCAM and GREM1 (deletion/duplication only).    Pancreatic cancer (HCC)  11/03/2023 Initial Diagnosis   Pancreatic cancer (HCC)   11/11/2023 -  Chemotherapy   Patient is on Treatment Plan : PANCREAS NALIRIFOX D1, 15 Q28D     11/30/2023 Genetic Testing   Negative genetic testing on the CancerNext-Expanded+RNAinsight panel.  VUS identified in KIT c.2083G>C and PMS2 c.-1C>A.  The report date is 11/30/2023.  The CancerNext-Expanded gene panel offered by [facility] and includes sequencing, rearrangement, and RNA analysis for the following 76 genes: AIP, ALK, APC, ATM, AXIN2, BAP1, BARD1, BMPR1A, BRCA1, BRCA2, BRIP1, CDC73, CDH1, CDK4, CDKN1B, CDKN2A, CEBPA, CHEK2, CTNNA1, DDX41, DICER1, ETV6, FH, FLCN, GATA2, LZTR1, MAX, MBD4, MEN1, MET, MLH1, MSH2, MSH3, MSH6, MUTYH, NF1, NF2, NTHL1, PALB2, PHOX2B, PMS2, POT1, PRKAR1A, PTCH1, PTEN, RAD51C, RAD51D, RB1, RET, RUNX1, SDHA, SDHAF2, SDHB, SDHC, SDHD, SMAD4, SMARCA4, SMARCB1, SMARCE1, STK11, SUFU, TMEM127, TP53, TSC1, TSC2, VHL, and WT1 (sequencing and deletion/duplication); EGFR, HOXB13, KIT, MITF, PDGFRA, POLD1, and POLE (sequencing only); EPCAM and GREM1 (deletion/duplication only).    01/29/2024 Imaging   CT abdomen and pelvis with contrast  IMPRESSION: 1. Diminished size of a hypodense mass of the pancreatic neck, consistent with treatment response. 2. Diminished size and hypodensity of numerous liver metastases, with new overlying pseudocirrhotic capsular retraction, consistent with treatment response. 3. 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 4. Unchanged prominent, nonspecific retroperitoneal lymph nodes.   5. Underlying hepatic steatosis.      Discussed the use of AI scribe software for clinical note transcription with the patient, who gave verbal consent to proceed.  History of Present Illness Ashley Clark is here for follow-up and chemotherapy.  She is overall doing well, mild fatigue, able to function well at home.  Denies any significant pain, nausea, or change of bowel habits.  She has been tolerating chemo well.  No other new complaints.     All other systems were reviewed with the patient and are negative.  MEDICAL HISTORY:  Past Medical History:  Diagnosis Date   Acute pulmonary embolism (HCC) 10/29/2023   Allergy    Back pain    Cancer (HCC) 2019   left breast cancer-last radiation Dec.2019   Diabetes mellitus without complication [facility])    Medical history non-contributory    Personal history of radiation therapy    Port-A-Cath in place - removed 12-11-2023 due to port-a-cath infection. 11/13/2023   Prediabetes    Tiredness    Vitamin D  deficiency     SURGICAL HISTORY: Past Surgical History:  Procedure Laterality Date   BREAST BIOPSY Left 08/04/2018   x3   BREAST LUMPECTOMY Left    09-29-18   BREAST LUMPECTOMY WITH RADIOACTIVE SEED AND SENTINEL LYMPH NODE BIOPSY Left 09/29/2018   Procedure: LEFT BREAST LUMPECTOMY WITH RADIOACTIVE SEED AND SENTINEL LYMPH NODE BIOPSY;  Surgeon: [name], MD;  Location: [facility];  Service: General;  Laterality: Left;   COLONOSCOPY  2016   IR IMAGING GUIDED PORT INSERTION  11/03/2023   IR PATIENT EVAL  TECH 0-60 MINS  12/18/2023   IR PATIENT EVAL TECH 0-60 MINS  12/24/2023   IR PATIENT EVAL TECH 0-60 MINS  12/28/2023   IR PATIENT EVAL TECH 0-60 MINS  01/04/2024   IR PATIENT EVAL TECH 0-60 MINS  01/13/2024   IR PATIENT EVAL TECH 0-60 MINS  01/22/2024   IR RADIOLOGIST EVAL & MGMT  12/15/2023   IR REMOVAL TUN ACCESS W/ PORT W/O FL MOD SED  12/11/2023   POLYPECTOMY     WISDOM TOOTH EXTRACTION      I have reviewed the  social history and family history with the patient and they are unchanged from previous note.  ALLERGIES:  is allergic to irinotecan  liposome.  MEDICATIONS:  Current Outpatient Medications  Medication Sig Dispense Refill   apixaban  (ELIQUIS ) 5 MG TABS tablet Take 1 tablet (5 mg total) by mouth 2 (two) times daily. 60 tablet 5   cetirizine (ZYRTEC) 10 MG chewable tablet Chew 10 mg by mouth daily.     DULoxetine  (CYMBALTA ) 20 MG capsule Take 1 capsule (20 mg total) by mouth daily. 30 capsule 0   gabapentin  (NEURONTIN ) 300 MG capsule Take 1 capsule (300 mg total) by mouth 2 (two) times daily. 60 capsule 1   lidocaine -prilocaine  (EMLA ) cream Apply to affected area once (Patient taking differently: Apply 1 Application topically See admin instructions. Apply to affected area for port access) 30 g 3   ondansetron  (ZOFRAN ) 8 MG tablet Take 1 tablet (8 mg total) by mouth every 8 (eight) hours as needed for nausea or vomiting. Start on the third day after irinotecan  30 tablet 1   potassium chloride  (KLOR-CON  M) 10 MEQ tablet Take 1 tablet (10 mEq total) by mouth 2 (two) times daily. 60 tablet 1   prochlorperazine  (COMPAZINE ) 10 MG tablet Take 1 tablet (10 mg total) by mouth every 6 (six) hours as needed for nausea or vomiting. 30 tablet 1   No current facility-administered medications for this visit.   Facility-Administered Medications Ordered in Other Visits  Medication Dose Route Frequency Provider Last Rate Last Admin   atropine  injection 0.4 mg  0.4 mg Intravenous Once [name], MD       fluorouracil  (ADRUCIL ) 4,500 mg in sodium chloride  0.9 % 60 mL chemo infusion  4,500 mg Intravenous 1 day or 1 dose [name], MD   Infusion Verify at 07/06/24 1250   heparin  lock flush 100 unit/mL  500 Units Intracatheter Once PRN [name], MD       heparin  lock flush 100 unit/mL  500 Units Intracatheter Once PRN [name], MD       sodium chloride  flush (NS) 0.9 % injection 10 mL  10 mL Intracatheter PRN [name], MD       sodium chloride  flush (NS) 0.9 % injection 10 mL  10 mL Intracatheter PRN [name], MD        PHYSICAL EXAMINATION: ECOG PERFORMANCE STATUS: 1 - Symptomatic but completely ambulatory  There were no vitals filed for this visit. Wt Readings from Last 3 Encounters:  07/06/24 76.8 kg (169 lb 6.4 oz)  06/21/24 76.7 kg (169 lb 3.2 oz)  06/08/24 77 kg (169 lb 11.2 oz)     GENERAL:alert, no distress and comfortable SKIN: skin color, texture, turgor are normal, no rashes or significant lesions EYES: normal, Conjunctiva are pink and non-injected, sclera clear NECK: supple, thyroid normal size, non-tender, without nodularity LYMPH:  no palpable lymphadenopathy in the cervical, axillary  LUNGS: clear to auscultation  and percussion with normal breathing effort HEART: regular rate & rhythm and no murmurs and no lower extremity edema ABDOMEN:abdomen soft, non-tender and normal bowel sounds Musculoskeletal:no cyanosis of digits and no clubbing  NEURO: alert & oriented x 3 with fluent speech, no focal motor/sensory deficits  Physical Exam   LABORATORY DATA:  I have reviewed the data as listed    Latest Ref Rng & Units 07/06/2024    7:38 AM 06/21/2024   10:28 AM 06/08/2024    8:32 AM  CBC  WBC 4.0 - 10.5 K/uL 3.5  5.5  5.7   Hemoglobin 12.0 - 15.0 g/dL 88.1  88.3  88.2   Hematocrit 36.0 - 46.0 % 36.5  35.7  36.1   Platelets 150 - 400 K/uL 203  202  193         Latest Ref Rng & Units 07/06/2024    7:38 AM 06/21/2024   10:28 AM 06/08/2024    8:32 AM  CMP  Glucose 70 - 99 mg/dL 846  856  838   BUN 8 - 23 mg/dL 5  5  6    Creatinine 0.44 - 1.00 mg/dL 9.28  9.47  9.41   Sodium 135 - 145 mmol/L 139  138  136   Potassium 3.5 - 5.1 mmol/L 3.7  3.6  3.8   Chloride 98 - 111 mmol/L 106  106  104   CO2 22 - 32 mmol/L 25  26  24    Calcium  8.9 - 10.3 mg/dL 8.8  8.7  8.8   Total Protein 6.5 - 8.1 g/dL 6.1  6.3  6.5   Total Bilirubin 0.0 - 1.2 mg/dL 0.3  0.3  0.3   Alkaline Phos 38 -  126 U/L 195  217  202   AST 15 - 41 U/L 14  16  15    ALT 0 - 44 U/L 8  9  8        RADIOGRAPHIC STUDIES: I have personally reviewed the radiological images as listed and agreed with the findings in the report. No results found.    Orders Placed This Encounter  Procedures   CT CHEST ABDOMEN PELVIS W CONTRAST    Standing Status:   Future    Expected Date:   07/27/2024    Expiration Date:   07/06/2025    Scheduling Instructions:     Please call her to schedule, she wants before or after her appointment in our office at 11am on 7/30    If indicated for the ordered procedure, I authorize the administration of contrast media per Radiology protocol:   Yes    Does the patient have a contrast media/X-ray dye allergy?:   No    Preferred imaging location?:   [facility]    If indicated for the ordered procedure, I authorize the administration of oral contrast media per Radiology protocol:   Yes   All questions were answered. The patient knows to call the clinic with any problems, questions or concerns. No barriers to learning was detected. The total time spent in the appointment was 25 minutes, including review of chart and various tests results, discussions about plan of care and coordination of care plan     Ashley Mattock, MD 07/06/2024

## 2024-07-08 ENCOUNTER — Inpatient Hospital Stay

## 2024-07-08 VITALS — BP 113/75 | HR 62 | Temp 98.6°F | Resp 17

## 2024-07-08 DIAGNOSIS — C251 Malignant neoplasm of body of pancreas: Secondary | ICD-10-CM | POA: Diagnosis not present

## 2024-07-08 DIAGNOSIS — Z5189 Encounter for other specified aftercare: Secondary | ICD-10-CM | POA: Diagnosis not present

## 2024-07-08 DIAGNOSIS — Z170 Estrogen receptor positive status [ER+]: Secondary | ICD-10-CM

## 2024-07-08 DIAGNOSIS — Z5111 Encounter for antineoplastic chemotherapy: Secondary | ICD-10-CM | POA: Diagnosis not present

## 2024-07-08 DIAGNOSIS — C787 Secondary malignant neoplasm of liver and intrahepatic bile duct: Secondary | ICD-10-CM | POA: Diagnosis not present

## 2024-07-08 MED ORDER — HEPARIN SOD (PORK) LOCK FLUSH 100 UNIT/ML IV SOLN
500.0000 [IU] | Freq: Once | INTRAVENOUS | Status: AC
Start: 2024-07-08 — End: 2024-07-08
  Administered 2024-07-08: 500 [IU]

## 2024-07-08 MED ORDER — PEGFILGRASTIM INJECTION 6 MG/0.6ML ~~LOC~~
6.0000 mg | PREFILLED_SYRINGE | Freq: Once | SUBCUTANEOUS | Status: AC
  Administered 2024-07-08: 6 mg via SUBCUTANEOUS
  Filled 2024-07-08: qty 0.6

## 2024-07-08 MED ORDER — SODIUM CHLORIDE 0.9% FLUSH
10.0000 mL | Freq: Once | INTRAVENOUS | Status: AC
Start: 2024-07-08 — End: 2024-07-08
  Administered 2024-07-08: 10 mL

## 2024-07-10 DIAGNOSIS — C259 Malignant neoplasm of pancreas, unspecified: Secondary | ICD-10-CM | POA: Diagnosis not present

## 2024-07-11 ENCOUNTER — Other Ambulatory Visit (HOSPITAL_COMMUNITY): Payer: Self-pay

## 2024-07-13 ENCOUNTER — Inpatient Hospital Stay

## 2024-07-13 DIAGNOSIS — C251 Malignant neoplasm of body of pancreas: Secondary | ICD-10-CM | POA: Diagnosis not present

## 2024-07-13 DIAGNOSIS — Z170 Estrogen receptor positive status [ER+]: Secondary | ICD-10-CM

## 2024-07-13 DIAGNOSIS — Z5189 Encounter for other specified aftercare: Secondary | ICD-10-CM | POA: Diagnosis not present

## 2024-07-13 DIAGNOSIS — Z5111 Encounter for antineoplastic chemotherapy: Secondary | ICD-10-CM | POA: Diagnosis not present

## 2024-07-13 DIAGNOSIS — C787 Secondary malignant neoplasm of liver and intrahepatic bile duct: Secondary | ICD-10-CM | POA: Diagnosis not present

## 2024-07-13 MED ORDER — SODIUM CHLORIDE 0.9% FLUSH
10.0000 mL | Freq: Once | INTRAVENOUS | Status: AC
  Administered 2024-07-13: 10 mL

## 2024-07-13 MED ORDER — HEPARIN SOD (PORK) LOCK FLUSH 100 UNIT/ML IV SOLN
500.0000 [IU] | Freq: Once | INTRAVENOUS | Status: AC
  Administered 2024-07-13: 500 [IU]

## 2024-07-19 NOTE — Assessment & Plan Note (Signed)
-  stage IV with liver mets, MSS, KRAS G12R mutation (+) -diagnosed in 10/2023 -Patient has a history of breast cancer. She presents with dyspnea, chest discomfort, decreased appetite, and weight loss. She also reports new onset back pain. -CT showed a 2.9cm mass in pancreatic neck/body, and multiple liver mets, liver biopsy confirmed adenocarcinoma  -I reviewed the aggressive nature of pancreatic cancer, and the incurable nature of her disease due to diffuse liver metastasis. -she start first line chemo Nalirifox chemotherapy regimen on 11/12, with dose reduction for first cycle due to poor PS -Performed molecular testing on tumor tissue to identify potential targeted therapy options, unfortunately no actionable mutations detected  -She underwent genetic testing and came back negative  -restaging CT 04/20/2024 showed mild improvement, chemo changed to 5-fu and liposomal irinotecan  in April 2025

## 2024-07-20 ENCOUNTER — Inpatient Hospital Stay: Admitting: Dietician

## 2024-07-20 ENCOUNTER — Inpatient Hospital Stay

## 2024-07-20 ENCOUNTER — Inpatient Hospital Stay (HOSPITAL_BASED_OUTPATIENT_CLINIC_OR_DEPARTMENT_OTHER): Admitting: Hematology

## 2024-07-20 ENCOUNTER — Encounter: Payer: Self-pay | Admitting: Hematology

## 2024-07-20 VITALS — BP 110/64 | HR 77 | Temp 97.7°F | Resp 17 | Ht 61.5 in | Wt 167.0 lb

## 2024-07-20 DIAGNOSIS — C259 Malignant neoplasm of pancreas, unspecified: Secondary | ICD-10-CM | POA: Diagnosis not present

## 2024-07-20 DIAGNOSIS — C251 Malignant neoplasm of body of pancreas: Secondary | ICD-10-CM | POA: Diagnosis not present

## 2024-07-20 DIAGNOSIS — C787 Secondary malignant neoplasm of liver and intrahepatic bile duct: Secondary | ICD-10-CM | POA: Diagnosis not present

## 2024-07-20 DIAGNOSIS — Z5189 Encounter for other specified aftercare: Secondary | ICD-10-CM | POA: Diagnosis not present

## 2024-07-20 DIAGNOSIS — Z5111 Encounter for antineoplastic chemotherapy: Secondary | ICD-10-CM | POA: Diagnosis not present

## 2024-07-20 DIAGNOSIS — Z170 Estrogen receptor positive status [ER+]: Secondary | ICD-10-CM

## 2024-07-20 LAB — CMP (CANCER CENTER ONLY)
ALT: 8 U/L (ref 0–44)
AST: 14 U/L — ABNORMAL LOW (ref 15–41)
Albumin: 4 g/dL (ref 3.5–5.0)
Alkaline Phosphatase: 201 U/L — ABNORMAL HIGH (ref 38–126)
Anion gap: 7 (ref 5–15)
BUN: 6 mg/dL — ABNORMAL LOW (ref 8–23)
CO2: 27 mmol/L (ref 22–32)
Calcium: 9.4 mg/dL (ref 8.9–10.3)
Chloride: 104 mmol/L (ref 98–111)
Creatinine: 0.58 mg/dL (ref 0.44–1.00)
GFR, Estimated: >60 mL/min (ref >60)
Glucose, Bld: 122 mg/dL — ABNORMAL HIGH (ref 70–99)
Potassium: 3.6 mmol/L (ref 3.5–5.1)
Sodium: 138 mmol/L (ref 135–145)
Total Bilirubin: 0.3 mg/dL (ref 0.0–1.2)
Total Protein: 6.9 g/dL (ref 6.5–8.1)

## 2024-07-20 LAB — CBC WITH DIFFERENTIAL (CANCER CENTER ONLY)
Abs Immature Granulocytes: 0.07 K/uL (ref 0.00–0.07)
Basophils Absolute: 0 K/uL (ref 0.0–0.1)
Basophils Relative: 1 %
Eosinophils Absolute: 0.1 K/uL (ref 0.0–0.5)
Eosinophils Relative: 1 %
HCT: 38.4 % (ref 36.0–46.0)
Hemoglobin: 12.5 g/dL (ref 12.0–15.0)
Immature Granulocytes: 1 %
Lymphocytes Relative: 17 %
Lymphs Abs: 1 K/uL (ref 0.7–4.0)
MCH: 28 pg (ref 26.0–34.0)
MCHC: 32.6 g/dL (ref 30.0–36.0)
MCV: 86.1 fL (ref 80.0–100.0)
Monocytes Absolute: 0.6 K/uL (ref 0.1–1.0)
Monocytes Relative: 10 %
Neutro Abs: 4.2 K/uL (ref 1.7–7.7)
Neutrophils Relative %: 70 %
Platelet Count: 197 K/uL (ref 150–400)
RBC: 4.46 MIL/uL (ref 3.87–5.11)
RDW: 15.6 % — ABNORMAL HIGH (ref 11.5–15.5)
WBC Count: 6 K/uL (ref 4.0–10.5)
nRBC: 0 % (ref 0.0–0.2)

## 2024-07-20 MED ORDER — SODIUM CHLORIDE 0.9% FLUSH: 10.0000 mL | INTRAVENOUS | Status: DC | PRN

## 2024-07-20 MED ORDER — SODIUM CHLORIDE 0.9 % IV SOLN: Freq: Once | INTRAVENOUS | Status: AC

## 2024-07-20 MED ORDER — SODIUM CHLORIDE 0.9 % IV SOLN
400.0000 mg/m2 | Freq: Once | INTRAVENOUS | Status: AC
  Administered 2024-07-20: 788 mg via INTRAVENOUS
  Filled 2024-07-20: qty 39.4

## 2024-07-20 MED ORDER — PALONOSETRON HCL INJECTION 0.25 MG/5ML
0.2500 mg | Freq: Once | INTRAVENOUS | Status: AC
  Administered 2024-07-20: 0.25 mg via INTRAVENOUS
  Filled 2024-07-20: qty 5

## 2024-07-20 MED ORDER — FAMOTIDINE IN NACL 20-0.9 MG/50ML-% IV SOLN
20.0000 mg | Freq: Once | INTRAVENOUS | Status: AC
  Administered 2024-07-20: 20 mg via INTRAVENOUS
  Filled 2024-07-20: qty 50

## 2024-07-20 MED ORDER — SODIUM CHLORIDE 0.9% FLUSH
10.0000 mL | Freq: Once | INTRAVENOUS | Status: AC
Start: 2024-07-20 — End: 2024-07-20
  Administered 2024-07-20: 10 mL

## 2024-07-20 MED ORDER — DEXAMETHASONE SODIUM PHOSPHATE 10 MG/ML IJ SOLN
10.0000 mg | Freq: Once | INTRAMUSCULAR | Status: AC
  Administered 2024-07-20: 10 mg via INTRAVENOUS
  Filled 2024-07-20: qty 1

## 2024-07-20 MED ORDER — SODIUM CHLORIDE 0.9 % IV SOLN
4500.0000 mg | INTRAVENOUS | Status: DC
  Administered 2024-07-20: 4500 mg via INTRAVENOUS
  Filled 2024-07-20: qty 90

## 2024-07-20 MED ORDER — ATROPINE SULFATE 1 MG/ML IV SOLN
0.4000 mg | Freq: Once | INTRAVENOUS | Status: AC
  Administered 2024-07-20: 0.4 mg via INTRAVENOUS
  Filled 2024-07-20: qty 1

## 2024-07-20 MED ORDER — SODIUM CHLORIDE 0.9 % IV SOLN
50.0000 mg/m2 | Freq: Once | INTRAVENOUS | Status: AC
  Administered 2024-07-20: 98.9 mg via INTRAVENOUS
  Filled 2024-07-20: qty 23

## 2024-07-20 NOTE — Progress Notes (Signed)
 [facility]   [phone] [phone]   Clinic Follow up Note   Patient Care Team: [name], [name] as PCP - General (Nurse Practitioner) [name], MD as Consulting Physician (Hematology and Oncology) [name], MD as Consulting Physician (Radiation Oncology) [name], MD as Consulting Physician (General Surgery) [name], MD as Consulting Physician (Hematology and Oncology)  Date of Service:  07/20/2024  CHIEF COMPLAINT: f/u of pancreatic cancer  CURRENT THERAPY:  Maintenance chemotherapy 5-FU and irinotecan   Oncology History   Pancreatic cancer (HCC) -stage IV with liver mets, MSS, KRAS G12R mutation (+) -diagnosed in 10/2023 -Patient has a history of breast cancer. She presents with dyspnea, chest discomfort, decreased appetite, and weight loss. She also reports new onset back pain. -CT showed a 2.9cm mass in pancreatic neck/body, and multiple liver mets, liver biopsy confirmed adenocarcinoma  -I reviewed the aggressive nature of pancreatic cancer, and the incurable nature of her disease due to diffuse liver metastasis. -she start first line chemo Nalirifox chemotherapy regimen on 11/12, with dose reduction for first cycle due to poor PS -Performed molecular testing on tumor tissue to identify potential targeted therapy options, unfortunately no actionable mutations detected  -She underwent genetic testing and came back negative  -restaging CT 04/20/2024 showed mild improvement, chemo changed to 5-fu and liposomal irinotecan  in April 2025  Assessment & Plan Metastatic cancer Metastatic cancer. Tumor markers are decreasing, and blood counts, kidney, and liver functions are normal. Calcium  and protein levels have normalized, and potassium level is stable at 3.6. She is managing well with the current treatment regimen and anticipates a good outcome from the upcoming CT scan based on current tumor marker trends. - Schedule CT scan  before the next infusion if possible  Chemotherapy-induced fatigue Chemotherapy-induced fatigue persists but is manageable. She reports feeling tired after chemotherapy sessions but recovers over a few days.  Peripheral neuropathy Peripheral neuropathy remains stable with no significant changes in symptoms.   Plan - Lab reviewed, adequate for treatment, will proceed to chemo today at the same dose, continue every 2 weeks - She will call radiology to schedule her restaging CT scan in the next few weeks - Follow-up in 2 weeks  SUMMARY OF ONCOLOGIC HISTORY: Oncology History  Malignant neoplasm of overlapping sites of left breast in female, estrogen receptor positive (HCC)  08/04/2018 Initial Diagnosis   Screening mammogram detected left breast mass at 9:30 position 1.2 x 1.2 x 1.1 cm.  Closer to the nipple at 9:30 position 0.8 cm lesion span 3.6 cm and a 1.5 cm apart.  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 position 2 cm from nipple, at 1 cm from nipple PASH, lymph node benign, ER 90%, PR 95%, Ki-67 20%, HER-2 negative by IHC 1+, T1CN0 stage Ia AJCC 8   09/29/2018 Surgery   Left lumpectomy: Grade 3 IDC, 1.7 cm, margins negative, lymphovascular invasion present, 0/2 lymph nodes negative, T1c N0 stage Ia   10/06/2018 Cancer Staging   Staging form: Breast, AJCC 8th Edition - Pathologic: Stage IA (pT1c, pN0(sn), cM0, G3, ER+, PR+, HER2-) - Signed by [name], MD on 10/06/2018   10/22/2018 Oncotype testing   Oncotype score 13: Distant recurrence at 9 years with hormone therapy alone 4%   11/12/2018 - 12/27/2018 Radiation Therapy   Adjuvant radiation therapy   12/27/2018 -  Anti-estrogen oral therapy   Antiestrogen therapy with letrozole   2.5 mg daily   11/30/2023 Genetic Testing   Negative genetic testing on the CancerNext-Expanded+RNAinsight panel.  VUS identified in KIT c.2083G>C and PMS2 c.-1C>A.  The report  date is 11/30/2023.  The CancerNext-Expanded gene panel offered by [facility] and includes sequencing, rearrangement, and RNA analysis for the following 76 genes: AIP, ALK, APC, ATM, AXIN2, BAP1, BARD1, BMPR1A, BRCA1, BRCA2, BRIP1, CDC73, CDH1, CDK4, CDKN1B, CDKN2A, CEBPA, CHEK2, CTNNA1, DDX41, DICER1, ETV6, FH, FLCN, GATA2, LZTR1, MAX, MBD4, MEN1, MET, MLH1, MSH2, MSH3, MSH6, MUTYH, NF1, NF2, NTHL1, PALB2, PHOX2B, PMS2, POT1, PRKAR1A, PTCH1, PTEN, RAD51C, RAD51D, RB1, RET, RUNX1, SDHA, SDHAF2, SDHB, SDHC, SDHD, SMAD4, SMARCA4, SMARCB1, SMARCE1, STK11, SUFU, TMEM127, TP53, TSC1, TSC2, VHL, and WT1 (sequencing and deletion/duplication); EGFR, HOXB13, KIT, MITF, PDGFRA, POLD1, and POLE (sequencing only); EPCAM and GREM1 (deletion/duplication only).    Pancreatic cancer (HCC)  11/03/2023 Initial Diagnosis   Pancreatic cancer (HCC)   11/11/2023 -  Chemotherapy   Patient is on Treatment Plan : PANCREAS NALIRIFOX D1, 15 Q28D     11/30/2023 Genetic Testing   Negative genetic testing on the CancerNext-Expanded+RNAinsight panel.  VUS identified in KIT c.2083G>C and PMS2 c.-1C>A.  The report date is 11/30/2023.  The CancerNext-Expanded gene panel offered by [facility] and includes sequencing, rearrangement, and RNA analysis for the following 76 genes: AIP, ALK, APC, ATM, AXIN2, BAP1, BARD1, BMPR1A, BRCA1, BRCA2, BRIP1, CDC73, CDH1, CDK4, CDKN1B, CDKN2A, CEBPA, CHEK2, CTNNA1, DDX41, DICER1, ETV6, FH, FLCN, GATA2, LZTR1, MAX, MBD4, MEN1, MET, MLH1, MSH2, MSH3, MSH6, MUTYH, NF1, NF2, NTHL1, PALB2, PHOX2B, PMS2, POT1, PRKAR1A, PTCH1, PTEN, RAD51C, RAD51D, RB1, RET, RUNX1, SDHA, SDHAF2, SDHB, SDHC, SDHD, SMAD4, SMARCA4, SMARCB1, SMARCE1, STK11, SUFU, TMEM127, TP53, TSC1, TSC2, VHL, and WT1 (sequencing and deletion/duplication); EGFR, HOXB13, KIT, MITF, PDGFRA, POLD1, and POLE (sequencing only); EPCAM and GREM1 (deletion/duplication only).    01/29/2024 Imaging   CT abdomen and pelvis with contrast   IMPRESSION: 1. Diminished size of a hypodense mass of the pancreatic neck, consistent with treatment response. 2. Diminished size and hypodensity of numerous liver metastases, with new overlying pseudocirrhotic capsular retraction, consistent with treatment response. 3. 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 4. Unchanged prominent, nonspecific retroperitoneal lymph nodes.  5. Underlying hepatic steatosis.      Discussed the use of AI scribe software for clinical note transcription with the patient, who gave verbal consent to proceed.  History of Present Illness [facility] is a 63 year old female with metastatic cancer who presents for follow-up.  She experiences fatigue following chemotherapy, requiring rest for four days. Occasional diarrhea is controlled with medication. Her weight is stable. Potassium level is 3.6, and she takes supplements every five to six days. Neuropathy symptoms persist without worsening. She feels well and wishes to maintain her current state.     All other systems were reviewed with the patient and are negative.  MEDICAL HISTORY:  Past Medical History:  Diagnosis Date   Acute pulmonary embolism (HCC) 10/29/2023   Allergy    Back pain    Cancer (HCC) 2019   left breast cancer-last radiation Dec.2019   Diabetes mellitus without complication [facility])    Medical history non-contributory    Personal history of radiation therapy    Port-A-Cath in place - removed 12-11-2023 due to port-a-cath infection. 11/13/2023   Prediabetes    Tiredness    Vitamin D  deficiency     SURGICAL HISTORY: Past Surgical History:  Procedure Laterality Date   BREAST BIOPSY Left 08/04/2018  x3   BREAST LUMPECTOMY Left    09-29-18   BREAST LUMPECTOMY WITH RADIOACTIVE SEED AND SENTINEL LYMPH NODE BIOPSY Left 09/29/2018   Procedure: LEFT BREAST LUMPECTOMY WITH RADIOACTIVE SEED AND SENTINEL LYMPH NODE BIOPSY;  Surgeon: [name], MD;   Location: [facility];  Service: General;  Laterality: Left;   COLONOSCOPY  2016   IR IMAGING GUIDED PORT INSERTION  11/03/2023   IR PATIENT EVAL TECH 0-60 MINS  12/18/2023   IR PATIENT EVAL TECH 0-60 MINS  12/24/2023   IR PATIENT EVAL TECH 0-60 MINS  12/28/2023   IR PATIENT EVAL TECH 0-60 MINS  01/04/2024   IR PATIENT EVAL TECH 0-60 MINS  01/13/2024   IR PATIENT EVAL TECH 0-60 MINS  01/22/2024   IR RADIOLOGIST EVAL & MGMT  12/15/2023   IR REMOVAL TUN ACCESS W/ PORT W/O FL MOD SED  12/11/2023   POLYPECTOMY     WISDOM TOOTH EXTRACTION      I have reviewed the social history and family history with the patient and they are unchanged from previous note.  ALLERGIES:  is allergic to irinotecan  liposome.  MEDICATIONS:  Current Outpatient Medications  Medication Sig Dispense Refill   apixaban  (ELIQUIS ) 5 MG TABS tablet Take 1 tablet (5 mg total) by mouth 2 (two) times daily. 60 tablet 5   cetirizine (ZYRTEC) 10 MG chewable tablet Chew 10 mg by mouth daily.     DULoxetine  (CYMBALTA ) 20 MG capsule Take 1 capsule (20 mg total) by mouth daily. 30 capsule 0   gabapentin  (NEURONTIN ) 300 MG capsule Take 1 capsule (300 mg total) by mouth 2 (two) times daily. 60 capsule 1   lidocaine -prilocaine  (EMLA ) cream Apply to affected area once (Patient taking differently: Apply 1 Application topically See admin instructions. Apply to affected area for port access) 30 g 3   ondansetron  (ZOFRAN ) 8 MG tablet Take 1 tablet (8 mg total) by mouth every 8 (eight) hours as needed for nausea or vomiting. Start on the third day after irinotecan  30 tablet 1   potassium chloride  (KLOR-CON  M) 10 MEQ tablet Take 1 tablet (10 mEq total) by mouth 2 (two) times daily. 60 tablet 1   prochlorperazine  (COMPAZINE ) 10 MG tablet Take 1 tablet (10 mg total) by mouth every 6 (six) hours as needed for nausea or vomiting. 30 tablet 1   No current facility-administered medications for this visit.   Facility-Administered  Medications Ordered in Other Visits  Medication Dose Route Frequency Provider Last Rate Last Admin   heparin  lock flush 100 unit/mL  500 Units Intracatheter Once PRN [name], MD       sodium chloride  flush (NS) 0.9 % injection 10 mL  10 mL Intracatheter PRN [name], MD        PHYSICAL EXAMINATION: ECOG PERFORMANCE STATUS: 1 - Symptomatic but completely ambulatory  Vitals:   07/20/24 0908  BP: 110/64  Pulse: 77  Resp: 17  Temp: 97.7 F (36.5 C)  SpO2: 94%   Wt Readings from Last 3 Encounters:  07/20/24 167 lb (75.8 kg)  07/06/24 169 lb 6.4 oz (76.8 kg)  06/21/24 169 lb 3.2 oz (76.7 kg)     GENERAL:alert, no distress and comfortable SKIN: skin color, texture, turgor are normal, no rashes or significant lesions EYES: normal, Conjunctiva are pink and non-injected, sclera clear NECK: supple, thyroid normal size, non-tender, without nodularity LYMPH:  no palpable lymphadenopathy in the cervical, axillary  LUNGS: clear to auscultation and percussion with normal breathing effort  HEART: regular rate & rhythm and no murmurs and no lower extremity edema ABDOMEN:abdomen soft, non-tender and normal bowel sounds Musculoskeletal:no cyanosis of digits and no clubbing  NEURO: alert & oriented x 3 with fluent speech, no focal motor/sensory deficits  Physical Exam    LABORATORY DATA:  I have reviewed the data as listed    Latest Ref Rng & Units 07/20/2024    8:32 AM 07/06/2024    7:38 AM 06/21/2024   10:28 AM  CBC  WBC 4.0 - 10.5 K/uL 6.0  3.5  5.5   Hemoglobin 12.0 - 15.0 g/dL 87.4  88.1  88.3   Hematocrit 36.0 - 46.0 % 38.4  36.5  35.7   Platelets 150 - 400 K/uL 197  203  202         Latest Ref Rng & Units 07/20/2024    8:32 AM 07/06/2024    7:38 AM 06/21/2024   10:28 AM  CMP  Glucose 70 - 99 mg/dL 877  846  856   BUN 8 - 23 mg/dL 6  5  5    Creatinine 0.44 - 1.00 mg/dL 9.41  9.28  9.47   Sodium 135 - 145 mmol/L 138  139  138   Potassium 3.5 - 5.1 mmol/L 3.6  3.7  3.6    Chloride 98 - 111 mmol/L 104  106  106   CO2 22 - 32 mmol/L 27  25  26    Calcium  8.9 - 10.3 mg/dL 9.4  8.8  8.7   Total Protein 6.5 - 8.1 g/dL 6.9  6.1  6.3   Total Bilirubin 0.0 - 1.2 mg/dL 0.3  0.3  0.3   Alkaline Phos 38 - 126 U/L 201  195  217   AST 15 - 41 U/L 14  14  16    ALT 0 - 44 U/L 8  8  9        RADIOGRAPHIC STUDIES: I have personally reviewed the radiological images as listed and agreed with the findings in the report. No results found.    No orders of the defined types were placed in this encounter.  All questions were answered. The patient knows to call the clinic with any problems, questions or concerns. No barriers to learning was detected. The total time spent in the appointment was 25 minutes, including review of chart and various tests results, discussions about plan of care and coordination of care plan     Onita Mattock, MD 07/20/2024

## 2024-07-20 NOTE — Patient Instructions (Signed)
 [facility] - A DEPT OF [facility] [facility]  Discharge Instructions: Thank you for choosing [facility] to provide your oncology and hematology care.   If you have a lab appointment with the Cancer Center, please go directly to the Cancer Center and check in at the registration area.   Wear comfortable clothing and clothing appropriate for easy access to any Portacath or PICC line.   We strive to give you quality time with your provider. You may need to reschedule your appointment if you arrive late (15 or more minutes).  Arriving late affects you and other patients whose appointments are after yours.  Also, if you miss three or more appointments without notifying the office, you may be dismissed from the clinic at the provider's discretion.      For prescription refill requests, have your pharmacy contact our office and allow 72 hours for refills to be completed.    Today you received the following chemotherapy and/or immunotherapy agents liposomal irinotecan , adrucil , and leucovorin       To help prevent nausea and vomiting after your treatment, we encourage you to take your nausea medication as directed.  BELOW ARE SYMPTOMS THAT SHOULD BE REPORTED IMMEDIATELY: *FEVER GREATER THAN 100.4 F (38 C) OR HIGHER *CHILLS OR SWEATING *NAUSEA AND VOMITING THAT IS NOT CONTROLLED WITH YOUR NAUSEA MEDICATION *UNUSUAL SHORTNESS OF BREATH *UNUSUAL BRUISING OR BLEEDING *URINARY PROBLEMS (pain or burning when urinating, or frequent urination) *BOWEL PROBLEMS (unusual diarrhea, constipation, pain near the anus) TENDERNESS IN MOUTH AND THROAT WITH OR WITHOUT PRESENCE OF ULCERS (sore throat, sores in mouth, or a toothache) UNUSUAL RASH, SWELLING OR PAIN  UNUSUAL VAGINAL DISCHARGE OR ITCHING   Items with * indicate a potential emergency and should be followed up as soon as possible or go to the Emergency Department if any problems should occur.  Please show the  CHEMOTHERAPY ALERT CARD or IMMUNOTHERAPY ALERT CARD at check-in to the Emergency Department and triage nurse.  Should you have questions after your visit or need to cancel or reschedule your appointment, please contact [facility] - A DEPT OF [facility]  Dept: [phone]  and follow the prompts.  Office hours are 8:00 a.m. to 4:30 p.m. Monday - Friday. Please note that voicemails left after 4:00 p.m. may not be returned until the following business day.  We are closed weekends and major holidays. You have access to a nurse at all times for urgent questions. Please call the main number to the clinic Dept: [phone] and follow the prompts.   For any non-urgent questions, you may also contact your provider using MyChart. We now offer e-Visits for anyone 57 and older to request care online for non-urgent symptoms. For details visit [email or web address].   Also download the MyChart app! Go to the app store, search MyChart, open the app, select [place], and log in with your MyChart username and password.

## 2024-07-20 NOTE — Progress Notes (Signed)
 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 Folfirinox discontinued after first cycle due to reaction. She is currently receiving Nalirifox q28d. Oxaliplatin  stopped due to neuropathy.    Met with patient in infusion. She is doing well overall. Patient reports poor appetite and fatigue lasting the first 3-4 days. Once resolved, patient reports appetite is good. She eats well and enjoys being with family and friends. Patient has noticed gravitating towards more sweets/carbohydrates (ice cream, cheese balls, corn chips). She denies nutrition impact symptoms at this time.    Medications: reviewed   Labs: glucose 122, BUN 6  Anthropometrics: Wt 167 lb today   7/9 - 169 lb 6.4 oz 6/24 - 169 lb 3.2 oz 5/28 - 167 lb 8 oz 4/30 - 166 lb 1 oz    NUTRITION DIAGNOSIS: Unintended wt loss - fairly stable    INTERVENTION:  Encourage protein source with meals/snacks Suggested protein shakes when appetite is poor    MONITORING, EVALUATION, GOAL: wt trends, intake   NEXT VISIT: To be scheduled as needed

## 2024-07-21 LAB — CANCER ANTIGEN 19-9: CA 19-9: 216 U/mL — ABNORMAL HIGH (ref 0–35)

## 2024-07-22 ENCOUNTER — Inpatient Hospital Stay

## 2024-07-22 VITALS — BP 100/73 | HR 68 | Temp 98.7°F | Resp 16

## 2024-07-22 DIAGNOSIS — Z5189 Encounter for other specified aftercare: Secondary | ICD-10-CM | POA: Diagnosis not present

## 2024-07-22 DIAGNOSIS — Z5111 Encounter for antineoplastic chemotherapy: Secondary | ICD-10-CM | POA: Diagnosis not present

## 2024-07-22 DIAGNOSIS — C251 Malignant neoplasm of body of pancreas: Secondary | ICD-10-CM | POA: Diagnosis not present

## 2024-07-22 DIAGNOSIS — C50812 Malignant neoplasm of overlapping sites of left female breast: Secondary | ICD-10-CM

## 2024-07-22 DIAGNOSIS — C787 Secondary malignant neoplasm of liver and intrahepatic bile duct: Secondary | ICD-10-CM | POA: Diagnosis not present

## 2024-07-22 MED ORDER — PEGFILGRASTIM INJECTION 6 MG/0.6ML ~~LOC~~
6.0000 mg | PREFILLED_SYRINGE | Freq: Once | SUBCUTANEOUS | Status: AC
  Administered 2024-07-22: 6 mg via SUBCUTANEOUS
  Filled 2024-07-22: qty 0.6

## 2024-07-22 MED ORDER — HEPARIN SOD (PORK) LOCK FLUSH 100 UNIT/ML IV SOLN
500.0000 [IU] | Freq: Once | INTRAVENOUS | Status: AC
Start: 2024-07-22 — End: 2024-07-22
  Administered 2024-07-22: 500 [IU]

## 2024-07-22 MED ORDER — SODIUM CHLORIDE 0.9% FLUSH
10.0000 mL | Freq: Once | INTRAVENOUS | Status: AC
  Administered 2024-07-22: 10 mL

## 2024-07-27 ENCOUNTER — Inpatient Hospital Stay

## 2024-07-27 DIAGNOSIS — C50812 Malignant neoplasm of overlapping sites of left female breast: Secondary | ICD-10-CM

## 2024-07-27 MED ORDER — SODIUM CHLORIDE 0.9% FLUSH: 10.0000 mL | Freq: Once | INTRAVENOUS | Status: DC

## 2024-07-27 MED ORDER — HEPARIN SOD (PORK) LOCK FLUSH 100 UNIT/ML IV SOLN: 500.0000 [IU] | Freq: Once | INTRAVENOUS | Status: DC

## 2024-07-28 ENCOUNTER — Ambulatory Visit (HOSPITAL_COMMUNITY)
Admission: RE | Admit: 2024-07-28 | Discharge: 2024-07-28 | Disposition: A | Discharge status: Home / Self Care | Source: Ambulatory Visit | Attending: Hematology | Admitting: Hematology

## 2024-07-28 DIAGNOSIS — N3289 Other specified disorders of bladder: Secondary | ICD-10-CM | POA: Diagnosis not present

## 2024-07-28 DIAGNOSIS — C251 Malignant neoplasm of body of pancreas: Secondary | ICD-10-CM | POA: Insufficient documentation

## 2024-07-28 DIAGNOSIS — C259 Malignant neoplasm of pancreas, unspecified: Secondary | ICD-10-CM | POA: Diagnosis not present

## 2024-07-28 DIAGNOSIS — K8020 Calculus of gallbladder without cholecystitis without obstruction: Secondary | ICD-10-CM | POA: Diagnosis not present

## 2024-07-28 DIAGNOSIS — K760 Fatty (change of) liver, not elsewhere classified: Secondary | ICD-10-CM | POA: Diagnosis not present

## 2024-07-28 MED ORDER — HEPARIN SOD (PORK) LOCK FLUSH 100 UNIT/ML IV SOLN
INTRAVENOUS | Status: AC
  Filled 2024-07-28: qty 5

## 2024-07-28 MED ORDER — HEPARIN SOD (PORK) LOCK FLUSH 100 UNIT/ML IV SOLN
500.0000 [IU] | Freq: Once | INTRAVENOUS | Status: AC
  Administered 2024-07-28: 500 [IU] via INTRAVENOUS

## 2024-07-28 MED ORDER — IOHEXOL 300 MG/ML  SOLN
100.0000 mL | Freq: Once | INTRAMUSCULAR | Status: AC | PRN
  Administered 2024-07-28: 100 mL via INTRAVENOUS

## 2024-08-02 ENCOUNTER — Inpatient Hospital Stay: Admitting: Licensed Clinical Social Worker

## 2024-08-02 ENCOUNTER — Inpatient Hospital Stay: Attending: Hematology

## 2024-08-02 ENCOUNTER — Inpatient Hospital Stay: Attending: Hematology | Admitting: Hematology

## 2024-08-02 ENCOUNTER — Inpatient Hospital Stay

## 2024-08-02 VITALS — BP 110/70 | HR 77 | Temp 98.4°F | Resp 16 | Ht 61.5 in | Wt 170.0 lb

## 2024-08-02 DIAGNOSIS — C50812 Malignant neoplasm of overlapping sites of left female breast: Secondary | ICD-10-CM | POA: Diagnosis not present

## 2024-08-02 DIAGNOSIS — Z5111 Encounter for antineoplastic chemotherapy: Secondary | ICD-10-CM | POA: Diagnosis present

## 2024-08-02 DIAGNOSIS — C259 Malignant neoplasm of pancreas, unspecified: Secondary | ICD-10-CM | POA: Diagnosis not present

## 2024-08-02 DIAGNOSIS — Z79811 Long term (current) use of aromatase inhibitors: Secondary | ICD-10-CM | POA: Diagnosis not present

## 2024-08-02 DIAGNOSIS — Z79634 Long term (current) use of topoisomerase inhibitor: Secondary | ICD-10-CM | POA: Diagnosis not present

## 2024-08-02 DIAGNOSIS — C251 Malignant neoplasm of body of pancreas: Secondary | ICD-10-CM

## 2024-08-02 DIAGNOSIS — Z170 Estrogen receptor positive status [ER+]: Secondary | ICD-10-CM | POA: Diagnosis not present

## 2024-08-02 DIAGNOSIS — C787 Secondary malignant neoplasm of liver and intrahepatic bile duct: Secondary | ICD-10-CM | POA: Insufficient documentation

## 2024-08-02 DIAGNOSIS — Z79631 Long term (current) use of antimetabolite agent: Secondary | ICD-10-CM | POA: Insufficient documentation

## 2024-08-02 DIAGNOSIS — Z5189 Encounter for other specified aftercare: Secondary | ICD-10-CM | POA: Insufficient documentation

## 2024-08-02 LAB — CMP (CANCER CENTER ONLY)
ALT: 7 U/L (ref 0–44)
AST: 12 U/L — ABNORMAL LOW (ref 15–41)
Albumin: 3.9 g/dL (ref 3.5–5.0)
Alkaline Phosphatase: 224 U/L — ABNORMAL HIGH (ref 38–126)
Anion gap: 8 (ref 5–15)
BUN: 6 mg/dL — ABNORMAL LOW (ref 8–23)
CO2: 28 mmol/L (ref 22–32)
Calcium: 8.9 mg/dL (ref 8.9–10.3)
Chloride: 104 mmol/L (ref 98–111)
Creatinine: 0.54 mg/dL (ref 0.44–1.00)
GFR, Estimated: >60 mL/min (ref >60)
Glucose, Bld: 129 mg/dL — ABNORMAL HIGH (ref 70–99)
Potassium: 3.5 mmol/L (ref 3.5–5.1)
Sodium: 140 mmol/L (ref 135–145)
Total Bilirubin: 0.3 mg/dL (ref 0.0–1.2)
Total Protein: 6.5 g/dL (ref 6.5–8.1)

## 2024-08-02 LAB — CBC WITH DIFFERENTIAL (CANCER CENTER ONLY)
Abs Immature Granulocytes: 0.18 K/uL — ABNORMAL HIGH (ref 0.00–0.07)
Basophils Absolute: 0 K/uL (ref 0.0–0.1)
Basophils Relative: 0 %
Eosinophils Absolute: 0.1 K/uL (ref 0.0–0.5)
Eosinophils Relative: 1 %
HCT: 37.3 % (ref 36.0–46.0)
Hemoglobin: 12.4 g/dL (ref 12.0–15.0)
Immature Granulocytes: 3 %
Lymphocytes Relative: 16 %
Lymphs Abs: 1.2 K/uL (ref 0.7–4.0)
MCH: 28.4 pg (ref 26.0–34.0)
MCHC: 33.2 g/dL (ref 30.0–36.0)
MCV: 85.6 fL (ref 80.0–100.0)
Monocytes Absolute: 0.7 K/uL (ref 0.1–1.0)
Monocytes Relative: 10 %
Neutro Abs: 4.9 K/uL (ref 1.7–7.7)
Neutrophils Relative %: 70 %
Platelet Count: 221 K/uL (ref 150–400)
RBC: 4.36 MIL/uL (ref 3.87–5.11)
RDW: 15.6 % — ABNORMAL HIGH (ref 11.5–15.5)
WBC Count: 7 K/uL (ref 4.0–10.5)
nRBC: 0 % (ref 0.0–0.2)

## 2024-08-02 MED ORDER — PALONOSETRON HCL INJECTION 0.25 MG/5ML
0.2500 mg | Freq: Once | INTRAVENOUS | Status: AC
  Administered 2024-08-02: 0.25 mg via INTRAVENOUS
  Filled 2024-08-02: qty 5

## 2024-08-02 MED ORDER — SODIUM CHLORIDE 0.9 % IV SOLN
50.0000 mg/m2 | Freq: Once | INTRAVENOUS | Status: AC
  Administered 2024-08-02: 98.9 mg via INTRAVENOUS
  Filled 2024-08-02: qty 23

## 2024-08-02 MED ORDER — SODIUM CHLORIDE 0.9 % IV SOLN
4500.0000 mg | INTRAVENOUS | Status: DC
  Administered 2024-08-02: 4500 mg via INTRAVENOUS
  Filled 2024-08-02: qty 90

## 2024-08-02 MED ORDER — SODIUM CHLORIDE 0.9 % IV SOLN
400.0000 mg/m2 | Freq: Once | INTRAVENOUS | Status: AC
  Administered 2024-08-02: 788 mg via INTRAVENOUS
  Filled 2024-08-02: qty 25

## 2024-08-02 MED ORDER — DEXAMETHASONE SODIUM PHOSPHATE 10 MG/ML IJ SOLN
10.0000 mg | Freq: Once | INTRAMUSCULAR | Status: AC
  Administered 2024-08-02: 10 mg via INTRAVENOUS
  Filled 2024-08-02: qty 1

## 2024-08-02 MED ORDER — SODIUM CHLORIDE 0.9% FLUSH
10.0000 mL | Freq: Once | INTRAVENOUS | Status: AC
Start: 2024-08-02 — End: 2024-08-02
  Administered 2024-08-02: 10 mL

## 2024-08-02 MED ORDER — ATROPINE SULFATE 1 MG/ML IV SOLN
0.4000 mg | Freq: Once | INTRAVENOUS | Status: AC
  Administered 2024-08-02: 0.4 mg via INTRAVENOUS
  Filled 2024-08-02: qty 1

## 2024-08-02 MED ORDER — FAMOTIDINE IN NACL 20-0.9 MG/50ML-% IV SOLN
20.0000 mg | Freq: Once | INTRAVENOUS | Status: AC
  Administered 2024-08-02: 20 mg via INTRAVENOUS
  Filled 2024-08-02: qty 50

## 2024-08-02 MED ORDER — ATROPINE SULFATE 1 MG/ML IV SOLN
0.5000 mg | Freq: Once | INTRAVENOUS | Status: DC | PRN
  Filled 2024-08-02: qty 1

## 2024-08-02 MED ORDER — SODIUM CHLORIDE 0.9 % IV SOLN: Freq: Once | INTRAVENOUS | Status: AC

## 2024-08-02 NOTE — Patient Instructions (Signed)
 [facility] - A DEPT OF [facility] [facility]  Discharge Instructions: Thank you for choosing [facility] to provide your oncology and hematology care.   If you have a lab appointment with the Cancer Center, please go directly to the Cancer Center and check in at the registration area.   Wear comfortable clothing and clothing appropriate for easy access to any Portacath or PICC line.   We strive to give you quality time with your provider. You may need to reschedule your appointment if you arrive late (15 or more minutes).  Arriving late affects you and other patients whose appointments are after yours.  Also, if you miss three or more appointments without notifying the office, you may be dismissed from the clinic at the provider's discretion.      For prescription refill requests, have your pharmacy contact our office and allow 72 hours for refills to be completed.    Today you received the following chemotherapy and/or immunotherapy agents liposomal irinotecan , adrucil , and leucovorin       To help prevent nausea and vomiting after your treatment, we encourage you to take your nausea medication as directed.  BELOW ARE SYMPTOMS THAT SHOULD BE REPORTED IMMEDIATELY: *FEVER GREATER THAN 100.4 F (38 C) OR HIGHER *CHILLS OR SWEATING *NAUSEA AND VOMITING THAT IS NOT CONTROLLED WITH YOUR NAUSEA MEDICATION *UNUSUAL SHORTNESS OF BREATH *UNUSUAL BRUISING OR BLEEDING *URINARY PROBLEMS (pain or burning when urinating, or frequent urination) *BOWEL PROBLEMS (unusual diarrhea, constipation, pain near the anus) TENDERNESS IN MOUTH AND THROAT WITH OR WITHOUT PRESENCE OF ULCERS (sore throat, sores in mouth, or a toothache) UNUSUAL RASH, SWELLING OR PAIN  UNUSUAL VAGINAL DISCHARGE OR ITCHING   Items with * indicate a potential emergency and should be followed up as soon as possible or go to the Emergency Department if any problems should occur.  Please show the  CHEMOTHERAPY ALERT CARD or IMMUNOTHERAPY ALERT CARD at check-in to the Emergency Department and triage nurse.  Should you have questions after your visit or need to cancel or reschedule your appointment, please contact [facility] - A DEPT OF [facility]  Dept: [phone]  and follow the prompts.  Office hours are 8:00 a.m. to 4:30 p.m. Monday - Friday. Please note that voicemails left after 4:00 p.m. may not be returned until the following business day.  We are closed weekends and major holidays. You have access to a nurse at all times for urgent questions. Please call the main number to the clinic Dept: [phone] and follow the prompts.   For any non-urgent questions, you may also contact your provider using MyChart. We now offer e-Visits for anyone 57 and older to request care online for non-urgent symptoms. For details visit [email or web address].   Also download the MyChart app! Go to the app store, search MyChart, open the app, select [place], and log in with your MyChart username and password.

## 2024-08-02 NOTE — Progress Notes (Signed)
 [facility]   Telephone:(336) [phone] [phone]   Clinic Follow up Note   Patient Care Team: [name], [name] as PCP - General (Nurse Practitioner) [name], MD as Consulting Physician (Hematology and Oncology) [name], MD as Consulting Physician (Radiation Oncology) [name], MD as Consulting Physician (General Surgery) [name], MD as Consulting Physician (Hematology and Oncology)  Date of Service:  08/02/2024  CHIEF COMPLAINT: f/u of pancreatic cancer  CURRENT THERAPY:  Maintenance chemotherapy with 5-FU and liposomal irinotecan  every 2 weeks  Oncology History   Pancreatic cancer (HCC) -stage IV with liver mets, MSS, KRAS G12R mutation (+) -diagnosed in 10/2023 -Patient has a history of breast cancer. She presents with dyspnea, chest discomfort, decreased appetite, and weight loss. She also reports new onset back pain. -CT showed a 2.9cm mass in pancreatic neck/body, and multiple liver mets, liver biopsy confirmed adenocarcinoma  -I reviewed the aggressive nature of pancreatic cancer, and the incurable nature of her disease due to diffuse liver metastasis. -she start first line chemo Nalirifox chemotherapy regimen on 11/12, with dose reduction for first cycle due to poor PS -Performed molecular testing on tumor tissue to identify potential targeted therapy options, unfortunately no actionable mutations detected  -She underwent genetic testing and came back negative  -restaging CT 04/20/2024 showed mild improvement, chemo changed to 5-fu and liposomal irinotecan  in April 2025  Assessment & Plan Pancreatic cancer on active chemotherapy Pancreatic cancer with stable tumor markers and a slight downward trend, indicating a positive response to chemotherapy. Recent scan results are pending. No new issues reported with chemotherapy, and she is managing symptoms well. 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 She is managing symptoms and functioning well despite the neuropathy.  Chemotherapy-related gastrointestinal symptoms (constipation and intermittent diarrhea) Intermittent diarrhea and constipation managed with Miralax and apple juice. Symptoms are currently well-controlled, with no significant issues reported in the last three days.  Plan - She is overall doing well, tolerating treatment well. - Labs reviewed, adequate for treatment, will proceed to chemo today at the same dose and continue every 2 weeks - Follow-up in 2 weeks   SUMMARY OF ONCOLOGIC HISTORY: Oncology History  Malignant neoplasm of overlapping sites of left breast in female, estrogen receptor positive (HCC)  08/04/2018 Initial Diagnosis   Screening mammogram detected left breast mass at 9:30 position 1.2 x 1.2 x 1.1 cm.  Closer to the nipple at 9:30 position 0.8 cm lesion span 3.6 cm and a 1.5 cm apart.  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 position 2 cm from nipple, at 1 cm from nipple PASH, lymph node benign, ER 90%, PR 95%, Ki-67 20%, HER-2 negative by IHC 1+, T1CN0 stage Ia AJCC 8   09/29/2018 Surgery   Left lumpectomy: Grade 3 IDC, 1.7 cm, margins negative, lymphovascular invasion present, 0/2 lymph nodes negative, T1c N0 stage Ia   10/06/2018 Cancer Staging   Staging form: Breast, AJCC 8th Edition - Pathologic: Stage IA (pT1c, pN0(sn), cM0, G3, ER+, PR+, HER2-) - Signed by [name], MD on 10/06/2018   10/22/2018 Oncotype testing   Oncotype score 13: Distant recurrence at 9 years with hormone therapy alone 4%   11/12/2018 - 12/27/2018 Radiation Therapy  Adjuvant radiation therapy   12/27/2018 -  Anti-estrogen oral therapy    Antiestrogen therapy with letrozole  2.5 mg daily   11/30/2023 Genetic Testing   Negative genetic testing on the CancerNext-Expanded+RNAinsight panel.  VUS identified in KIT c.2083G>C and PMS2 c.-1C>A.  The report date is 11/30/2023.  The CancerNext-Expanded gene panel offered by [facility] and includes sequencing, rearrangement, and RNA analysis for the following 76 genes: AIP, ALK, APC, ATM, AXIN2, BAP1, BARD1, BMPR1A, BRCA1, BRCA2, BRIP1, CDC73, CDH1, CDK4, CDKN1B, CDKN2A, CEBPA, CHEK2, CTNNA1, DDX41, DICER1, ETV6, FH, FLCN, GATA2, LZTR1, MAX, MBD4, MEN1, MET, MLH1, MSH2, MSH3, MSH6, MUTYH, NF1, NF2, NTHL1, PALB2, PHOX2B, PMS2, POT1, PRKAR1A, PTCH1, PTEN, RAD51C, RAD51D, RB1, RET, RUNX1, SDHA, SDHAF2, SDHB, SDHC, SDHD, SMAD4, SMARCA4, SMARCB1, SMARCE1, STK11, SUFU, TMEM127, TP53, TSC1, TSC2, VHL, and WT1 (sequencing and deletion/duplication); EGFR, HOXB13, KIT, MITF, PDGFRA, POLD1, and POLE (sequencing only); EPCAM and GREM1 (deletion/duplication only).    Pancreatic cancer (HCC)  11/03/2023 Initial Diagnosis   Pancreatic cancer (HCC)   11/11/2023 -  Chemotherapy   Patient is on Treatment Plan : PANCREAS NALIRIFOX D1, 15 Q28D     11/30/2023 Genetic Testing   Negative genetic testing on the CancerNext-Expanded+RNAinsight panel.  VUS identified in KIT c.2083G>C and PMS2 c.-1C>A.  The report date is 11/30/2023.  The CancerNext-Expanded gene panel offered by [facility] and includes sequencing, rearrangement, and RNA analysis for the following 76 genes: AIP, ALK, APC, ATM, AXIN2, BAP1, BARD1, BMPR1A, BRCA1, BRCA2, BRIP1, CDC73, CDH1, CDK4, CDKN1B, CDKN2A, CEBPA, CHEK2, CTNNA1, DDX41, DICER1, ETV6, FH, FLCN, GATA2, LZTR1, MAX, MBD4, MEN1, MET, MLH1, MSH2, MSH3, MSH6, MUTYH, NF1, NF2, NTHL1, PALB2, PHOX2B, PMS2, POT1, PRKAR1A, PTCH1, PTEN, RAD51C, RAD51D, RB1, RET, RUNX1, SDHA, SDHAF2, SDHB, SDHC, SDHD, SMAD4, SMARCA4, SMARCB1, SMARCE1, STK11, SUFU, TMEM127, TP53, TSC1, TSC2, VHL, and WT1  (sequencing and deletion/duplication); EGFR, HOXB13, KIT, MITF, PDGFRA, POLD1, and POLE (sequencing only); EPCAM and GREM1 (deletion/duplication only).    01/29/2024 Imaging   CT abdomen and pelvis with contrast  IMPRESSION: 1. Diminished size of a hypodense mass of the pancreatic neck, consistent with treatment response. 2. Diminished size and hypodensity of numerous liver metastases, with new overlying pseudocirrhotic capsular retraction, consistent with treatment response. 3. 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 4. Unchanged prominent, nonspecific retroperitoneal lymph nodes.  5. Underlying hepatic steatosis.      Discussed the use of AI scribe software for clinical note transcription with the patient, who gave verbal consent to proceed.  History of Present Illness Ashley Clark is a 63 year old female with pancreatic cancer who presents for follow-up.  She feels well overall and manages her symptoms daily. Her last scan was on July 31st, with another scan last Thursday, pending results. She experiences occasional diarrhea, which resolves quickly, and constipation managed with Miralax and apple juice. She has been stable in the last three days with regular eating and bowel movements. Her chemotherapy schedule is coordinated with her vacation plans. The PICC line is functioning well. No significant issues with chemotherapy or neuropsychiatric symptoms.     All other systems were reviewed with the patient and are negative.  MEDICAL HISTORY:  Past Medical History:  Diagnosis Date   Acute pulmonary embolism (HCC) 10/29/2023   Allergy    Back pain    Cancer (HCC) 2019   left breast cancer-last radiation Dec.2019   Diabetes mellitus without complication [facility])    Medical history non-contributory    Personal history of radiation therapy  Port-A-Cath in place - removed 12-11-2023 due to port-a-cath infection. 11/13/2023   Prediabetes    Tiredness     Vitamin D  deficiency     SURGICAL HISTORY: Past Surgical History:  Procedure Laterality Date   BREAST BIOPSY Left 08/04/2018   x3   BREAST LUMPECTOMY Left    09-29-18   BREAST LUMPECTOMY WITH RADIOACTIVE SEED AND SENTINEL LYMPH NODE BIOPSY Left 09/29/2018   Procedure: LEFT BREAST LUMPECTOMY WITH RADIOACTIVE SEED AND SENTINEL LYMPH NODE BIOPSY;  Surgeon: [name], MD;  Location: [facility];  Service: General;  Laterality: Left;   COLONOSCOPY  2016   IR IMAGING GUIDED PORT INSERTION  11/03/2023   IR PATIENT EVAL TECH 0-60 MINS  12/18/2023   IR PATIENT EVAL TECH 0-60 MINS  12/24/2023   IR PATIENT EVAL TECH 0-60 MINS  12/28/2023   IR PATIENT EVAL TECH 0-60 MINS  01/04/2024   IR PATIENT EVAL TECH 0-60 MINS  01/13/2024   IR PATIENT EVAL TECH 0-60 MINS  01/22/2024   IR RADIOLOGIST EVAL & MGMT  12/15/2023   IR REMOVAL TUN ACCESS W/ PORT W/O FL MOD SED  12/11/2023   POLYPECTOMY     WISDOM TOOTH EXTRACTION      I have reviewed the social history and family history with the patient and they are unchanged from previous note.  ALLERGIES:  is allergic to irinotecan  liposome.  MEDICATIONS:  Current Outpatient Medications  Medication Sig Dispense Refill   apixaban  (ELIQUIS ) 5 MG TABS tablet Take 1 tablet (5 mg total) by mouth 2 (two) times daily. 60 tablet 5   cetirizine (ZYRTEC) 10 MG chewable tablet Chew 10 mg by mouth daily.     DULoxetine  (CYMBALTA ) 20 MG capsule Take 1 capsule (20 mg total) by mouth daily. 30 capsule 0   gabapentin  (NEURONTIN ) 300 MG capsule Take 1 capsule (300 mg total) by mouth 2 (two) times daily. 60 capsule 1   lidocaine -prilocaine  (EMLA ) cream Apply to affected area once (Patient taking differently: Apply 1 Application topically See admin instructions. Apply to affected area for port access) 30 g 3   ondansetron  (ZOFRAN ) 8 MG tablet Take 1 tablet (8 mg total) by mouth every 8 (eight) hours as needed for nausea or vomiting. Start on the third day after  irinotecan  30 tablet 1   potassium chloride  (KLOR-CON  M) 10 MEQ tablet Take 1 tablet (10 mEq total) by mouth 2 (two) times daily. 60 tablet 1   prochlorperazine  (COMPAZINE ) 10 MG tablet Take 1 tablet (10 mg total) by mouth every 6 (six) hours as needed for nausea or vomiting. 30 tablet 1   No current facility-administered medications for this visit.   Facility-Administered Medications Ordered in Other Visits  Medication Dose Route Frequency Provider Last Rate Last Admin   atropine  injection 0.5 mg  0.5 mg Intravenous Once PRN [name], MD       fluorouracil  (ADRUCIL ) 4,500 mg in sodium chloride  0.9 % 60 mL chemo infusion  4,500 mg Intravenous 1 day or 1 dose [name], MD       heparin  lock flush 100 unit/mL  500 Units Intracatheter Once PRN [name], MD       sodium chloride  flush (NS) 0.9 % injection 10 mL  10 mL Intracatheter PRN [name], MD        PHYSICAL EXAMINATION: ECOG PERFORMANCE STATUS: 1 - Symptomatic but completely ambulatory  Vitals:   08/02/24 0903  BP: 110/70  Pulse: 77  Resp: 16  Temp:  98.4 F (36.9 C)  SpO2: 97%   Wt Readings from Last 3 Encounters:  08/02/24 170 lb (77.1 kg)  07/20/24 167 lb (75.8 kg)  07/06/24 169 lb 6.4 oz (76.8 kg)     GENERAL:alert, no distress and comfortable SKIN: skin color, texture, turgor are normal, no rashes or significant lesions EYES: normal, Conjunctiva are pink and non-injected, sclera clear Musculoskeletal:no cyanosis of digits and no clubbing  NEURO: alert & oriented x 3 with fluent speech, no focal motor/sensory deficits  Physical Exam    LABORATORY DATA:  I have reviewed the data as listed    Latest Ref Rng & Units 08/02/2024    8:32 AM 07/20/2024    8:32 AM 07/06/2024    7:38 AM  CBC  WBC 4.0 - 10.5 K/uL 7.0  6.0  3.5   Hemoglobin 12.0 - 15.0 g/dL 87.5  87.4  88.1   Hematocrit 36.0 - 46.0 % 37.3  38.4  36.5   Platelets 150 - 400 K/uL 221  197  203         Latest Ref Rng & Units 08/02/2024    8:32 AM  07/20/2024    8:32 AM 07/06/2024    7:38 AM  CMP  Glucose 70 - 99 mg/dL 870  877  846   BUN 8 - 23 mg/dL 6  6  5    Creatinine 0.44 - 1.00 mg/dL 9.45  9.41  9.28   Sodium 135 - 145 mmol/L 140  138  139   Potassium 3.5 - 5.1 mmol/L 3.5  3.6  3.7   Chloride 98 - 111 mmol/L 104  104  106   CO2 22 - 32 mmol/L 28  27  25    Calcium  8.9 - 10.3 mg/dL 8.9  9.4  8.8   Total Protein 6.5 - 8.1 g/dL 6.5  6.9  6.1   Total Bilirubin 0.0 - 1.2 mg/dL 0.3  0.3  0.3   Alkaline Phos 38 - 126 U/L 224  201  195   AST 15 - 41 U/L 12  14  14    ALT 0 - 44 U/L 7  8  8        RADIOGRAPHIC STUDIES: I have personally reviewed the radiological images as listed and agreed with the findings in the report. No results found.    Orders Placed This Encounter  Procedures   CBC with Differential (Cancer Center Only)    Standing Status:   Future    Expected Date:   10/12/2024    Expiration Date:   10/12/2025   CMP (Cancer Center only)    Standing Status:   Future    Expected Date:   10/12/2024    Expiration Date:   10/12/2025   CBC with Differential (Cancer Center Only)    Standing Status:   Future    Expected Date:   10/26/2024    Expiration Date:   10/26/2025   CMP (Cancer Center only)    Standing Status:   Future    Expected Date:   10/26/2024    Expiration Date:   10/26/2025   All questions were answered. The patient knows to call the clinic with any problems, questions or concerns. No barriers to learning was detected. The total time spent in the appointment was 25 minutes, including review of chart and various tests results, discussions about plan of care and coordination of care plan     Onita Mattock, MD 08/02/2024

## 2024-08-02 NOTE — Assessment & Plan Note (Signed)
-  stage IV with liver mets, MSS, KRAS G12R mutation (+) -diagnosed in 10/2023 -Patient has a history of breast cancer. She presents with dyspnea, chest discomfort, decreased appetite, and weight loss. She also reports new onset back pain. -CT showed a 2.9cm mass in pancreatic neck/body, and multiple liver mets, liver biopsy confirmed adenocarcinoma  -I reviewed the aggressive nature of pancreatic cancer, and the incurable nature of her disease due to diffuse liver metastasis. -she start first line chemo Nalirifox chemotherapy regimen on 11/12, with dose reduction for first cycle due to poor PS -Performed molecular testing on tumor tissue to identify potential targeted therapy options, unfortunately no actionable mutations detected  -She underwent genetic testing and came back negative  -restaging CT 04/20/2024 showed mild improvement, chemo changed to 5-fu and liposomal irinotecan  in April 2025

## 2024-08-02 NOTE — Progress Notes (Signed)
 [facility] CSW Progress Note  Visual merchandiser met with patient to follow-up on questions regarding insurance.    Interventions: Pt reports her current insurance through her employer will only be paid through October.  Pt's secondary is [employer].  The policy currently would not cover all of pt's medical needs; however, open enrollment is in October.  CSW suggested pt explore expanded coverage through [employer] and provided the telephone number for [employer].  Pt could utilize [employer] if she needed to bridge for a month or two before [employer] benefits become active.        Follow Up Plan:  Patient will contact CSW with any support or resource needs    Ashley JONELLE Manna, LCSW Clinical Social Worker [facility]

## 2024-08-03 ENCOUNTER — Ambulatory Visit

## 2024-08-03 ENCOUNTER — Encounter

## 2024-08-03 ENCOUNTER — Ambulatory Visit: Admitting: Hematology

## 2024-08-03 ENCOUNTER — Other Ambulatory Visit: Payer: Self-pay

## 2024-08-04 ENCOUNTER — Inpatient Hospital Stay

## 2024-08-04 VITALS — BP 120/80 | HR 54 | Temp 98.2°F | Resp 18

## 2024-08-04 DIAGNOSIS — Z170 Estrogen receptor positive status [ER+]: Secondary | ICD-10-CM

## 2024-08-04 DIAGNOSIS — Z5111 Encounter for antineoplastic chemotherapy: Secondary | ICD-10-CM | POA: Diagnosis not present

## 2024-08-04 DIAGNOSIS — C251 Malignant neoplasm of body of pancreas: Secondary | ICD-10-CM

## 2024-08-04 MED ORDER — SODIUM CHLORIDE 0.9% FLUSH
10.0000 mL | Freq: Once | INTRAVENOUS | Status: AC
Start: 2024-08-04 — End: 2024-08-04
  Administered 2024-08-04: 10 mL

## 2024-08-04 MED ORDER — PEGFILGRASTIM INJECTION 6 MG/0.6ML ~~LOC~~
6.0000 mg | PREFILLED_SYRINGE | Freq: Once | SUBCUTANEOUS | Status: AC
  Administered 2024-08-04: 6 mg via SUBCUTANEOUS
  Filled 2024-08-04: qty 0.6

## 2024-08-05 ENCOUNTER — Encounter

## 2024-08-09 ENCOUNTER — Telehealth: Payer: Self-pay

## 2024-08-09 NOTE — Telephone Encounter (Signed)
 Notified the pt regarding her disability form being completed, faxed, and confirmation received. Pt copy will mailed. No questions or concerns to be noted at this time.

## 2024-08-10 ENCOUNTER — Inpatient Hospital Stay

## 2024-08-10 DIAGNOSIS — Z170 Estrogen receptor positive status [ER+]: Secondary | ICD-10-CM

## 2024-08-10 DIAGNOSIS — C50812 Malignant neoplasm of overlapping sites of left female breast: Secondary | ICD-10-CM

## 2024-08-10 DIAGNOSIS — Z5111 Encounter for antineoplastic chemotherapy: Secondary | ICD-10-CM | POA: Diagnosis not present

## 2024-08-10 MED ORDER — SODIUM CHLORIDE 0.9% FLUSH
10.0000 mL | Freq: Once | INTRAVENOUS | Status: AC
Start: 2024-08-10 — End: 2024-08-10
  Administered 2024-08-10 (×2): 10 mL

## 2024-08-17 ENCOUNTER — Encounter: Payer: Self-pay | Admitting: Hematology

## 2024-08-17 ENCOUNTER — Ambulatory Visit

## 2024-08-17 ENCOUNTER — Ambulatory Visit (HOSPITAL_BASED_OUTPATIENT_CLINIC_OR_DEPARTMENT_OTHER): Admitting: Hematology

## 2024-08-17 VITALS — BP 120/83 | HR 62 | Temp 97.9°F | Resp 17 | Ht 61.5 in | Wt 168.3 lb

## 2024-08-17 DIAGNOSIS — C251 Malignant neoplasm of body of pancreas: Secondary | ICD-10-CM

## 2024-08-17 DIAGNOSIS — Z170 Estrogen receptor positive status [ER+]: Secondary | ICD-10-CM

## 2024-08-17 DIAGNOSIS — Z5111 Encounter for antineoplastic chemotherapy: Secondary | ICD-10-CM | POA: Diagnosis not present

## 2024-08-17 DIAGNOSIS — C259 Malignant neoplasm of pancreas, unspecified: Secondary | ICD-10-CM | POA: Diagnosis not present

## 2024-08-17 LAB — CBC WITH DIFFERENTIAL (CANCER CENTER ONLY)
Abs Immature Granulocytes: 0.04 K/uL (ref 0.00–0.07)
Basophils Absolute: 0 K/uL (ref 0.0–0.1)
Basophils Relative: 1 %
Eosinophils Absolute: 0.1 K/uL (ref 0.0–0.5)
Eosinophils Relative: 1 %
HCT: 38.9 % (ref 36.0–46.0)
Hemoglobin: 12.5 g/dL (ref 12.0–15.0)
Immature Granulocytes: 1 %
Lymphocytes Relative: 22 %
Lymphs Abs: 1.2 K/uL (ref 0.7–4.0)
MCH: 27.7 pg (ref 26.0–34.0)
MCHC: 32.1 g/dL (ref 30.0–36.0)
MCV: 86.1 fL (ref 80.0–100.0)
Monocytes Absolute: 0.7 K/uL (ref 0.1–1.0)
Monocytes Relative: 12 %
Neutro Abs: 3.4 K/uL (ref 1.7–7.7)
Neutrophils Relative %: 63 %
Platelet Count: 203 K/uL (ref 150–400)
RBC: 4.52 MIL/uL (ref 3.87–5.11)
RDW: 15.4 % (ref 11.5–15.5)
WBC Count: 5.4 K/uL (ref 4.0–10.5)
nRBC: 0 % (ref 0.0–0.2)

## 2024-08-17 LAB — CMP (CANCER CENTER ONLY)
ALT: 8 U/L (ref 0–44)
AST: 13 U/L — ABNORMAL LOW (ref 15–41)
Albumin: 3.9 g/dL (ref 3.5–5.0)
Alkaline Phosphatase: 198 U/L — ABNORMAL HIGH (ref 38–126)
Anion gap: 6 (ref 5–15)
BUN: <5 mg/dL — ABNORMAL LOW (ref 8–23)
CO2: 28 mmol/L (ref 22–32)
Calcium: 8.8 mg/dL — ABNORMAL LOW (ref 8.9–10.3)
Chloride: 105 mmol/L (ref 98–111)
Creatinine: 0.48 mg/dL (ref 0.44–1.00)
GFR, Estimated: >60 mL/min (ref >60)
Glucose, Bld: 97 mg/dL (ref 70–99)
Potassium: 3.7 mmol/L (ref 3.5–5.1)
Sodium: 139 mmol/L (ref 135–145)
Total Bilirubin: 0.4 mg/dL (ref 0.0–1.2)
Total Protein: 6.5 g/dL (ref 6.5–8.1)

## 2024-08-17 MED ORDER — SODIUM CHLORIDE 0.9% FLUSH: 10.0000 mL | INTRAVENOUS | Status: DC | PRN

## 2024-08-17 MED ORDER — SODIUM CHLORIDE 0.9 % IV SOLN: Freq: Once | INTRAVENOUS | Status: AC

## 2024-08-17 MED ORDER — DEXAMETHASONE SODIUM PHOSPHATE 10 MG/ML IJ SOLN
10.0000 mg | Freq: Once | INTRAMUSCULAR | Status: AC
  Administered 2024-08-17: 10 mg via INTRAVENOUS
  Filled 2024-08-17: qty 1

## 2024-08-17 MED ORDER — PALONOSETRON HCL INJECTION 0.25 MG/5ML
0.2500 mg | Freq: Once | INTRAVENOUS | Status: AC
  Administered 2024-08-17: 0.25 mg via INTRAVENOUS
  Filled 2024-08-17: qty 5

## 2024-08-17 MED ORDER — HEPARIN SOD (PORK) LOCK FLUSH 100 UNIT/ML IV SOLN
500.0000 [IU] | Freq: Once | INTRAVENOUS | Status: DC | PRN
Start: 2024-08-17 — End: 2024-08-17

## 2024-08-17 MED ORDER — SODIUM CHLORIDE 0.9 % IV SOLN
400.0000 mg/m2 | Freq: Once | INTRAVENOUS | Status: AC
  Administered 2024-08-17: 788 mg via INTRAVENOUS
  Filled 2024-08-17: qty 25

## 2024-08-17 MED ORDER — SODIUM CHLORIDE 0.9 % IV SOLN
4500.0000 mg | INTRAVENOUS | Status: DC
  Administered 2024-08-17: 4500 mg via INTRAVENOUS
  Filled 2024-08-17: qty 90

## 2024-08-17 MED ORDER — SODIUM CHLORIDE 0.9 % IV SOLN
50.0000 mg/m2 | Freq: Once | INTRAVENOUS | Status: AC
  Administered 2024-08-17: 98.9 mg via INTRAVENOUS
  Filled 2024-08-17: qty 23

## 2024-08-17 MED ORDER — SODIUM CHLORIDE 0.9% FLUSH
10.0000 mL | Freq: Once | INTRAVENOUS | Status: AC
Start: 2024-08-17 — End: 2024-08-17
  Administered 2024-08-17: 10 mL

## 2024-08-17 MED ORDER — ATROPINE SULFATE 1 MG/ML IV SOLN
0.4000 mg | Freq: Once | INTRAVENOUS | Status: AC
  Administered 2024-08-17: 0.4 mg via INTRAVENOUS
  Filled 2024-08-17: qty 1

## 2024-08-17 MED ORDER — FAMOTIDINE IN NACL 20-0.9 MG/50ML-% IV SOLN
20.0000 mg | Freq: Once | INTRAVENOUS | Status: AC
  Administered 2024-08-17: 20 mg via INTRAVENOUS
  Filled 2024-08-17: qty 50

## 2024-08-17 NOTE — Assessment & Plan Note (Signed)
-  stage IV with liver mets, MSS, KRAS G12R mutation (+) -diagnosed in 10/2023 -Patient has a history of breast cancer. She presents with dyspnea, chest discomfort, decreased appetite, and weight loss. She also reports new onset back pain. -CT showed a 2.9cm mass in pancreatic neck/body, and multiple liver mets, liver biopsy confirmed adenocarcinoma  -I reviewed the aggressive nature of pancreatic cancer, and the incurable nature of her disease due to diffuse liver metastasis. -she start first line chemo Nalirifox chemotherapy regimen on 11/12, with dose reduction for first cycle due to poor PS -Performed molecular testing on tumor tissue to identify potential targeted therapy options, unfortunately no actionable mutations detected  -She underwent genetic testing and came back negative  -restaging CT 04/20/2024 showed mild improvement, chemo changed to 5-fu and liposomal irinotecan  in April 2025 -CT 07/28/2024 showed overall stable disease

## 2024-08-17 NOTE — Progress Notes (Signed)
 [facility]   [phone] [phone]   Clinic Follow up Note   Patient Care Team: [name], [name] as PCP - General (Nurse Practitioner) [name], MD as Consulting Physician (Hematology and Oncology) [name], MD as Consulting Physician (Radiation Oncology) [name], MD as Consulting Physician (General Surgery) [name], MD as Consulting Physician (Hematology and Oncology)  Date of Service:  08/17/2024  CHIEF COMPLAINT: f/u of pancreatic cancer  CURRENT THERAPY:  5-FU and liposomal irinotecan  every 2 weeks  Oncology History   Pancreatic cancer (HCC) -stage IV with liver mets, MSS, KRAS G12R mutation (+) -diagnosed in 10/2023 -Patient has a history of breast cancer. She presents with dyspnea, chest discomfort, decreased appetite, and weight loss. She also reports new onset back pain. -CT showed a 2.9cm mass in pancreatic neck/body, and multiple liver mets, liver biopsy confirmed adenocarcinoma  -I reviewed the aggressive nature of pancreatic cancer, and the incurable nature of her disease due to diffuse liver metastasis. -she start first line chemo Nalirifox chemotherapy regimen on 11/12, with dose reduction for first cycle due to poor PS -Performed molecular testing on tumor tissue to identify potential targeted therapy options, unfortunately no actionable mutations detected  -She underwent genetic testing and came back negative  -restaging CT 04/20/2024 showed mild improvement, chemo changed to 5-fu and liposomal irinotecan  in April 2025 -CT 07/28/2024 showed overall stable disease  Assessment & Plan Metastatic pancreatic cancer Metastatic pancreatic cancer under maintenance therapy. Recent CT scan was stable. Tumor marker pending, last value was 200. Reports 50-60% activity level, stable weight, and no gastrointestinal symptoms such as diarrhea. Continues to manage well with current treatment regimen. Discussed ongoing treatment  and the possibility of needing to change therapy in the future. Advised to remain on long-term disability due to ongoing treatment needs. - Continue current maintenance therapy - Monitor tumor markers - Schedule CT scan every three months  Hypocalcemia Mild hypocalcemia noted on recent labs. No symptoms reported. - Recommend over-the-counter calcium  supplementation - Recommend vitamin D  supplementation  Plan - Patient is clinically stable, lab reviewed, adequate for treatment, will proceed to chemo today and continue every 2 weeks - Due to her metastatic cancer and ongoing intensive chemotherapy, patient is not able to return to work. - Follow-up in 2 weeks.   SUMMARY OF ONCOLOGIC HISTORY: Oncology History  Malignant neoplasm of overlapping sites of left breast in female, estrogen receptor positive (HCC)  08/04/2018 Initial Diagnosis   Screening mammogram detected left breast mass at 9:30 position 1.2 x 1.2 x 1.1 cm.  Closer to the nipple at 9:30 position 0.8 cm lesion span 3.6 cm and a 1.5 cm apart.  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 position 2 cm from nipple, at 1 cm from nipple PASH, lymph node benign, ER 90%, PR 95%, Ki-67 20%, HER-2 negative by IHC 1+, T1CN0 stage Ia AJCC 8   09/29/2018 Surgery   Left lumpectomy: Grade 3 IDC, 1.7 cm, margins negative, lymphovascular invasion present, 0/2 lymph nodes negative, T1c N0 stage Ia   10/06/2018 Cancer Staging   Staging form: Breast, AJCC 8th Edition - Pathologic: Stage IA (pT1c, pN0(sn), cM0, G3, ER+, PR+, HER2-) - Signed by [name], MD on 10/06/2018   10/22/2018 Oncotype testing   Oncotype score 13: Distant recurrence at 9 years with hormone therapy alone 4%   11/12/2018 - 12/27/2018 Radiation Therapy   Adjuvant radiation  therapy   12/27/2018 -  Anti-estrogen oral therapy   Antiestrogen therapy with letrozole  2.5 mg daily   11/30/2023 Genetic  Testing   Negative genetic testing on the CancerNext-Expanded+RNAinsight panel.  VUS identified in KIT c.2083G>C and PMS2 c.-1C>A.  The report date is 11/30/2023.  The CancerNext-Expanded gene panel offered by [facility] and includes sequencing, rearrangement, and RNA analysis for the following 76 genes: AIP, ALK, APC, ATM, AXIN2, BAP1, BARD1, BMPR1A, BRCA1, BRCA2, BRIP1, CDC73, CDH1, CDK4, CDKN1B, CDKN2A, CEBPA, CHEK2, CTNNA1, DDX41, DICER1, ETV6, FH, FLCN, GATA2, LZTR1, MAX, MBD4, MEN1, MET, MLH1, MSH2, MSH3, MSH6, MUTYH, NF1, NF2, NTHL1, PALB2, PHOX2B, PMS2, POT1, PRKAR1A, PTCH1, PTEN, RAD51C, RAD51D, RB1, RET, RUNX1, SDHA, SDHAF2, SDHB, SDHC, SDHD, SMAD4, SMARCA4, SMARCB1, SMARCE1, STK11, SUFU, TMEM127, TP53, TSC1, TSC2, VHL, and WT1 (sequencing and deletion/duplication); EGFR, HOXB13, KIT, MITF, PDGFRA, POLD1, and POLE (sequencing only); EPCAM and GREM1 (deletion/duplication only).    Pancreatic cancer (HCC)  11/03/2023 Initial Diagnosis   Pancreatic cancer (HCC)   11/11/2023 -  Chemotherapy   Patient is on Treatment Plan : PANCREAS NALIRIFOX D1, 15 Q28D     11/30/2023 Genetic Testing   Negative genetic testing on the CancerNext-Expanded+RNAinsight panel.  VUS identified in KIT c.2083G>C and PMS2 c.-1C>A.  The report date is 11/30/2023.  The CancerNext-Expanded gene panel offered by [facility] and includes sequencing, rearrangement, and RNA analysis for the following 76 genes: AIP, ALK, APC, ATM, AXIN2, BAP1, BARD1, BMPR1A, BRCA1, BRCA2, BRIP1, CDC73, CDH1, CDK4, CDKN1B, CDKN2A, CEBPA, CHEK2, CTNNA1, DDX41, DICER1, ETV6, FH, FLCN, GATA2, LZTR1, MAX, MBD4, MEN1, MET, MLH1, MSH2, MSH3, MSH6, MUTYH, NF1, NF2, NTHL1, PALB2, PHOX2B, PMS2, POT1, PRKAR1A, PTCH1, PTEN, RAD51C, RAD51D, RB1, RET, RUNX1, SDHA, SDHAF2, SDHB, SDHC, SDHD, SMAD4, SMARCA4, SMARCB1, SMARCE1, STK11, SUFU, TMEM127, TP53, TSC1, TSC2, VHL, and WT1 (sequencing and deletion/duplication); EGFR, HOXB13, KIT, MITF, PDGFRA, POLD1, and  POLE (sequencing only); EPCAM and GREM1 (deletion/duplication only).    01/29/2024 Imaging   CT abdomen and pelvis with contrast  IMPRESSION: 1. Diminished size of a hypodense mass of the pancreatic neck, consistent with treatment response. 2. Diminished size and hypodensity of numerous liver metastases, with new overlying pseudocirrhotic capsular retraction, consistent with treatment response. 3. 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 4. Unchanged prominent, nonspecific retroperitoneal lymph nodes.  5. Underlying hepatic steatosis.      Discussed the use of AI scribe software for clinical note transcription with the patient, who gave verbal consent to proceed.  History of Present Illness Ashley Clark is a 63 year old female with metastatic pancreatic cancer who presents for follow-up.  She has no new symptoms and feels good overall. Her weight is stable at 168 pounds, and she maintains adequate food intake despite an abnormal eating pattern. Her activity level is 50-60% of normal, and she is able to cook and travel. Her blood counts, kidney, and liver functions are normal, with slightly low calcium  levels. Her last tumor marker was 200. She is not on pancreatic enzyme replacement therapy, as her current eating pattern manages her symptoms effectively. She is on long-term disability until October 30th.     All other systems were reviewed with the patient and are negative.  MEDICAL HISTORY:  Past Medical History:  Diagnosis Date   Acute pulmonary embolism (HCC) 10/29/2023   Allergy    Back pain    Cancer (HCC) 2019   left breast cancer-last radiation Dec.2019   Diabetes mellitus without complication [facility])    Medical history non-contributory  Personal history of radiation therapy    Port-A-Cath in place - removed 12-11-2023 due to port-a-cath infection. 11/13/2023   Prediabetes    Tiredness    Vitamin D  deficiency     SURGICAL HISTORY: Past  Surgical History:  Procedure Laterality Date   BREAST BIOPSY Left 08/04/2018   x3   BREAST LUMPECTOMY Left    09-29-18   BREAST LUMPECTOMY WITH RADIOACTIVE SEED AND SENTINEL LYMPH NODE BIOPSY Left 09/29/2018   Procedure: LEFT BREAST LUMPECTOMY WITH RADIOACTIVE SEED AND SENTINEL LYMPH NODE BIOPSY;  Surgeon: [name], MD;  Location: [facility];  Service: General;  Laterality: Left;   COLONOSCOPY  2016   IR IMAGING GUIDED PORT INSERTION  11/03/2023   IR PATIENT EVAL TECH 0-60 MINS  12/18/2023   IR PATIENT EVAL TECH 0-60 MINS  12/24/2023   IR PATIENT EVAL TECH 0-60 MINS  12/28/2023   IR PATIENT EVAL TECH 0-60 MINS  01/04/2024   IR PATIENT EVAL TECH 0-60 MINS  01/13/2024   IR PATIENT EVAL TECH 0-60 MINS  01/22/2024   IR RADIOLOGIST EVAL & MGMT  12/15/2023   IR REMOVAL TUN ACCESS W/ PORT W/O FL MOD SED  12/11/2023   POLYPECTOMY     WISDOM TOOTH EXTRACTION      I have reviewed the social history and family history with the patient and they are unchanged from previous note.  ALLERGIES:  is allergic to irinotecan  liposome.  MEDICATIONS:  Current Outpatient Medications  Medication Sig Dispense Refill   apixaban  (ELIQUIS ) 5 MG TABS tablet Take 1 tablet (5 mg total) by mouth 2 (two) times daily. 60 tablet 5   cetirizine (ZYRTEC) 10 MG chewable tablet Chew 10 mg by mouth daily.     DULoxetine  (CYMBALTA ) 20 MG capsule Take 1 capsule (20 mg total) by mouth daily. 30 capsule 0   gabapentin  (NEURONTIN ) 300 MG capsule Take 1 capsule (300 mg total) by mouth 2 (two) times daily. 60 capsule 1   lidocaine -prilocaine  (EMLA ) cream Apply to affected area once (Patient taking differently: Apply 1 Application topically See admin instructions. Apply to affected area for port access) 30 g 3   ondansetron  (ZOFRAN ) 8 MG tablet Take 1 tablet (8 mg total) by mouth every 8 (eight) hours as needed for nausea or vomiting. Start on the third day after irinotecan  30 tablet 1   potassium chloride   (KLOR-CON  M) 10 MEQ tablet Take 1 tablet (10 mEq total) by mouth 2 (two) times daily. 60 tablet 1   prochlorperazine  (COMPAZINE ) 10 MG tablet Take 1 tablet (10 mg total) by mouth every 6 (six) hours as needed for nausea or vomiting. 30 tablet 1   No current facility-administered medications for this visit.   Facility-Administered Medications Ordered in Other Visits  Medication Dose Route Frequency Provider Last Rate Last Admin   atropine  injection 0.4 mg  0.4 mg Intravenous Once [name], MD       famotidine  (PEPCID ) IVPB 20 mg premix  20 mg Intravenous Once [name], MD 200 mL/hr at 08/17/24 1035 20 mg at 08/17/24 1035   fluorouracil  (ADRUCIL ) 4,500 mg in sodium chloride  0.9 % 60 mL chemo infusion  4,500 mg Intravenous 1 day or 1 dose [name], MD       heparin  lock flush 100 unit/mL  500 Units Intracatheter Once PRN [name], MD       heparin  lock flush 100 unit/mL  500 Units Intracatheter Once PRN [name], MD  irinotecan  LIPOSOME (ONIVYDE ) 98.9 mg in sodium chloride  0.9 % 500 mL chemo infusion  50 mg/m2 (Treatment Plan Recorded) Intravenous Once [name], MD       leucovorin  788 mg in sodium chloride  0.9 % 250 mL infusion  400 mg/m2 (Treatment Plan Recorded) Intravenous Once [name], MD       sodium chloride  flush (NS) 0.9 % injection 10 mL  10 mL Intracatheter PRN [name], MD       sodium chloride  flush (NS) 0.9 % injection 10 mL  10 mL Intracatheter PRN [name], MD        PHYSICAL EXAMINATION: ECOG PERFORMANCE STATUS: 1 - Symptomatic but completely ambulatory  Vitals:   08/17/24 0957  BP: 120/83  Pulse: 62  Resp: 17  Temp: 97.9 F (36.6 C)  SpO2: 100%   Wt Readings from Last 3 Encounters:  08/17/24 168 lb 4.8 oz (76.3 kg)  08/02/24 170 lb (77.1 kg)  07/20/24 167 lb (75.8 kg)     GENERAL:alert, no distress and comfortable SKIN: skin color, texture, turgor are normal, no rashes or significant lesions EYES: normal, Conjunctiva are pink and non-injected,  sclera clear NECK: supple, thyroid normal size, non-tender, without nodularity LYMPH:  no palpable lymphadenopathy in the cervical, axillary  LUNGS: clear to auscultation and percussion with normal breathing effort HEART: regular rate & rhythm and no murmurs and no lower extremity edema ABDOMEN:abdomen soft, non-tender and normal bowel sounds Musculoskeletal:no cyanosis of digits and no clubbing  NEURO: alert & oriented x 3 with fluent speech, no focal motor/sensory deficits  Physical Exam MEASUREMENTS: Weight- 168.  LABORATORY DATA:  I have reviewed the data as listed    Latest Ref Rng & Units 08/17/2024    9:05 AM 08/02/2024    8:32 AM 07/20/2024    8:32 AM  CBC  WBC 4.0 - 10.5 K/uL 5.4  7.0  6.0   Hemoglobin 12.0 - 15.0 g/dL 87.4  87.5  87.4   Hematocrit 36.0 - 46.0 % 38.9  37.3  38.4   Platelets 150 - 400 K/uL 203  221  197         Latest Ref Rng & Units 08/17/2024    9:05 AM 08/02/2024    8:32 AM 07/20/2024    8:32 AM  CMP  Glucose 70 - 99 mg/dL 97  870  877   BUN 8 - 23 mg/dL 5  6  6    Creatinine 0.44 - 1.00 mg/dL 9.51  9.45  9.41   Sodium 135 - 145 mmol/L 139  140  138   Potassium 3.5 - 5.1 mmol/L 3.7  3.5  3.6   Chloride 98 - 111 mmol/L 105  104  104   CO2 22 - 32 mmol/L 28  28  27    Calcium  8.9 - 10.3 mg/dL 8.8  8.9  9.4   Total Protein 6.5 - 8.1 g/dL 6.5  6.5  6.9   Total Bilirubin 0.0 - 1.2 mg/dL 0.4  0.3  0.3   Alkaline Phos 38 - 126 U/L 198  224  201   AST 15 - 41 U/L 13  12  14    ALT 0 - 44 U/L 8  7  8        RADIOGRAPHIC STUDIES: I have personally reviewed the radiological images as listed and agreed with the findings in the report. No results found.    Orders Placed This Encounter  Procedures   CBC with Differential (Cancer Center Only)  Standing Status:   Future    Expected Date:   11/09/2024    Expiration Date:   11/09/2025   CMP (Cancer Center only)    Standing Status:   Future    Expected Date:   11/09/2024    Expiration Date:   11/09/2025    CBC with Differential (Cancer Center Only)    Standing Status:   Future    Expected Date:   11/23/2024    Expiration Date:   11/23/2025   CMP (Cancer Center only)    Standing Status:   Future    Expected Date:   11/23/2024    Expiration Date:   11/23/2025   CBC with Differential (Cancer Center Only)    Standing Status:   Future    Expected Date:   12/07/2024    Expiration Date:   12/07/2025   CMP (Cancer Center only)    Standing Status:   Future    Expected Date:   12/07/2024    Expiration Date:   12/07/2025   CBC with Differential (Cancer Center Only)    Standing Status:   Future    Expected Date:   12/21/2024    Expiration Date:   12/21/2025   CMP (Cancer Center only)    Standing Status:   Future    Expected Date:   12/21/2024    Expiration Date:   12/21/2025   All questions were answered. The patient knows to call the clinic with any problems, questions or concerns. No barriers to learning was detected. The total time spent in the appointment was 30 minutes, including review of chart and various tests results, discussions about plan of care and coordination of care plan     Onita Mattock, MD 08/17/2024

## 2024-08-17 NOTE — Patient Instructions (Signed)
 [facility] - A DEPT OF [facility]  Discharge Instructions: Thank you for choosing [facility] to provide your oncology and hematology care.   If you have a lab appointment with the Cancer Center, please go directly to the Cancer Center and check in at the registration area.   Wear comfortable clothing and clothing appropriate for easy access to any Portacath or PICC line.   We strive to give you quality time with your provider. You may need to reschedule your appointment if you arrive late (15 or more minutes).  Arriving late affects you and other patients whose appointments are after yours.  Also, if you miss three or more appointments without notifying the office, you may be dismissed from the clinic at the provider's discretion.      For prescription refill requests, have your pharmacy contact our office and allow 72 hours for refills to be completed.    Today you received the following chemotherapy and/or immunotherapy agents: Liposomal Irinotecan, Leucovorin, Fluorouracil.       To help prevent nausea and vomiting after your treatment, we encourage you to take your nausea medication as directed.  BELOW ARE SYMPTOMS THAT SHOULD BE REPORTED IMMEDIATELY: *FEVER GREATER THAN 100.4 F (38 C) OR HIGHER *CHILLS OR SWEATING *NAUSEA AND VOMITING THAT IS NOT CONTROLLED WITH YOUR NAUSEA MEDICATION *UNUSUAL SHORTNESS OF BREATH *UNUSUAL BRUISING OR BLEEDING *URINARY PROBLEMS (pain or burning when urinating, or frequent urination) *BOWEL PROBLEMS (unusual diarrhea, constipation, pain near the anus) TENDERNESS IN MOUTH AND THROAT WITH OR WITHOUT PRESENCE OF ULCERS (sore throat, sores in mouth, or a toothache) UNUSUAL RASH, SWELLING OR PAIN  UNUSUAL VAGINAL DISCHARGE OR ITCHING   Items with * indicate a potential emergency and should be followed up as soon as possible or go to the Emergency Department if any problems should occur.  Please show the  CHEMOTHERAPY ALERT CARD or IMMUNOTHERAPY ALERT CARD at check-in to the Emergency Department and triage nurse.  Should you have questions after your visit or need to cancel or reschedule your appointment, please contact [facility] - A DEPT [facility]  Dept: [phone]  and follow the prompts.  Office hours are 8:00 a.m. to 4:30 p.m. Monday - Friday. Please note that voicemails left after 4:00 p.m. may not be returned until the following business day.  We are closed weekends and major holidays. You have access to a nurse at all times for urgent questions. Please call the main number to the clinic Dept: [phone] and follow the prompts.   For any non-urgent questions, you may also contact your provider using MyChart. We now offer e-Visits for anyone 74 and older to request care online for non-urgent symptoms. For details visit [email or web address].   Also download the MyChart app! Go to the app store, search "MyChart", open the app, select [facility], and log in with your MyChart username and password.

## 2024-08-18 LAB — CANCER ANTIGEN 19-9: CA 19-9: 297 U/mL — ABNORMAL HIGH (ref 0–35)

## 2024-08-19 ENCOUNTER — Ambulatory Visit

## 2024-08-19 ENCOUNTER — Ambulatory Visit: Payer: Self-pay | Admitting: Nurse Practitioner

## 2024-08-19 VITALS — BP 111/66 | HR 62 | Temp 98.5°F | Resp 18

## 2024-08-19 DIAGNOSIS — C251 Malignant neoplasm of body of pancreas: Secondary | ICD-10-CM

## 2024-08-19 DIAGNOSIS — Z5111 Encounter for antineoplastic chemotherapy: Secondary | ICD-10-CM | POA: Diagnosis not present

## 2024-08-19 MED ORDER — PEGFILGRASTIM INJECTION 6 MG/0.6ML ~~LOC~~
6.0000 mg | PREFILLED_SYRINGE | Freq: Once | SUBCUTANEOUS | Status: AC
  Administered 2024-08-19: 6 mg via SUBCUTANEOUS
  Filled 2024-08-19: qty 0.6

## 2024-08-22 ENCOUNTER — Telehealth: Payer: Self-pay

## 2024-08-22 NOTE — Telephone Encounter (Signed)
 Pt reached to the office in regards to her Accommodation form.She stated that HR notified her to let her know her form had expire. The pt asked if I can send  HR a copy. I emailed  a copy per patient. Did received email back from HR as confirmation.No questions or concerns to be noted.

## 2024-08-24 ENCOUNTER — Inpatient Hospital Stay

## 2024-08-24 DIAGNOSIS — C251 Malignant neoplasm of body of pancreas: Secondary | ICD-10-CM

## 2024-08-24 DIAGNOSIS — Z5111 Encounter for antineoplastic chemotherapy: Secondary | ICD-10-CM | POA: Diagnosis not present

## 2024-08-24 DIAGNOSIS — C259 Malignant neoplasm of pancreas, unspecified: Secondary | ICD-10-CM

## 2024-08-24 DIAGNOSIS — C50812 Malignant neoplasm of overlapping sites of left female breast: Secondary | ICD-10-CM

## 2024-08-24 MED ORDER — SODIUM CHLORIDE 0.9% FLUSH
10.0000 mL | Freq: Once | INTRAVENOUS | Status: AC
  Administered 2024-08-24: 10 mL

## 2024-08-26 ENCOUNTER — Other Ambulatory Visit (HOSPITAL_COMMUNITY): Payer: Self-pay

## 2024-08-30 NOTE — Assessment & Plan Note (Signed)
-  stage IV with liver mets, MSS, KRAS G12R mutation (+) -diagnosed in 10/2023 -Patient has a history of breast cancer. She presents with dyspnea, chest discomfort, decreased appetite, and weight loss. She also reports new onset back pain. -CT showed a 2.9cm mass in pancreatic neck/body, and multiple liver mets, liver biopsy confirmed adenocarcinoma  -I reviewed the aggressive nature of pancreatic cancer, and the incurable nature of her disease due to diffuse liver metastasis. -she start first line chemo Nalirifox chemotherapy regimen on 11/12, with dose reduction for first cycle due to poor PS -Performed molecular testing on tumor tissue to identify potential targeted therapy options, unfortunately no actionable mutations detected  -She underwent genetic testing and came back negative  -restaging CT 04/20/2024 showed mild improvement, chemo changed to 5-fu and liposomal irinotecan  in April 2025 -CT 07/28/2024 showed overall stable disease

## 2024-08-31 ENCOUNTER — Other Ambulatory Visit: Payer: Self-pay

## 2024-08-31 ENCOUNTER — Inpatient Hospital Stay: Attending: Hematology

## 2024-08-31 ENCOUNTER — Inpatient Hospital Stay (HOSPITAL_BASED_OUTPATIENT_CLINIC_OR_DEPARTMENT_OTHER): Admitting: Hematology

## 2024-08-31 ENCOUNTER — Other Ambulatory Visit (HOSPITAL_COMMUNITY): Payer: Self-pay

## 2024-08-31 ENCOUNTER — Inpatient Hospital Stay

## 2024-08-31 VITALS — BP 116/68 | HR 86 | Temp 97.3°F | Resp 17 | Ht 61.5 in | Wt 168.4 lb

## 2024-08-31 DIAGNOSIS — Z79634 Long term (current) use of topoisomerase inhibitor: Secondary | ICD-10-CM | POA: Insufficient documentation

## 2024-08-31 DIAGNOSIS — Z5189 Encounter for other specified aftercare: Secondary | ICD-10-CM | POA: Diagnosis not present

## 2024-08-31 DIAGNOSIS — C787 Secondary malignant neoplasm of liver and intrahepatic bile duct: Secondary | ICD-10-CM | POA: Diagnosis not present

## 2024-08-31 DIAGNOSIS — C251 Malignant neoplasm of body of pancreas: Secondary | ICD-10-CM

## 2024-08-31 DIAGNOSIS — Z79630 Long term (current) use of alkylating agent: Secondary | ICD-10-CM | POA: Insufficient documentation

## 2024-08-31 DIAGNOSIS — C258 Malignant neoplasm of overlapping sites of pancreas: Secondary | ICD-10-CM | POA: Diagnosis not present

## 2024-08-31 DIAGNOSIS — Z5111 Encounter for antineoplastic chemotherapy: Secondary | ICD-10-CM | POA: Diagnosis present

## 2024-08-31 LAB — CBC WITH DIFFERENTIAL (CANCER CENTER ONLY)
Abs Immature Granulocytes: 0.13 K/uL — ABNORMAL HIGH (ref 0.00–0.07)
Basophils Absolute: 0.1 K/uL (ref 0.0–0.1)
Basophils Relative: 1 %
Eosinophils Absolute: 0.1 K/uL (ref 0.0–0.5)
Eosinophils Relative: 1 %
HCT: 38.8 % (ref 36.0–46.0)
Hemoglobin: 12.7 g/dL (ref 12.0–15.0)
Immature Granulocytes: 2 %
Lymphocytes Relative: 15 %
Lymphs Abs: 1 K/uL (ref 0.7–4.0)
MCH: 28.1 pg (ref 26.0–34.0)
MCHC: 32.7 g/dL (ref 30.0–36.0)
MCV: 85.8 fL (ref 80.0–100.0)
Monocytes Absolute: 0.9 K/uL (ref 0.1–1.0)
Monocytes Relative: 13 %
Neutro Abs: 4.9 K/uL (ref 1.7–7.7)
Neutrophils Relative %: 68 %
Platelet Count: 216 K/uL (ref 150–400)
RBC: 4.52 MIL/uL (ref 3.87–5.11)
RDW: 15.5 % (ref 11.5–15.5)
WBC Count: 7.1 K/uL (ref 4.0–10.5)
nRBC: 0 % (ref 0.0–0.2)

## 2024-08-31 LAB — CMP (CANCER CENTER ONLY)
ALT: 9 U/L (ref 0–44)
AST: 13 U/L — ABNORMAL LOW (ref 15–41)
Albumin: 3.8 g/dL (ref 3.5–5.0)
Alkaline Phosphatase: 222 U/L — ABNORMAL HIGH (ref 38–126)
Anion gap: 7 (ref 5–15)
BUN: 7 mg/dL — ABNORMAL LOW (ref 8–23)
CO2: 28 mmol/L (ref 22–32)
Calcium: 8.8 mg/dL — ABNORMAL LOW (ref 8.9–10.3)
Chloride: 104 mmol/L (ref 98–111)
Creatinine: 0.51 mg/dL (ref 0.44–1.00)
GFR, Estimated: >60 mL/min (ref >60)
Glucose, Bld: 106 mg/dL — ABNORMAL HIGH (ref 70–99)
Potassium: 3.8 mmol/L (ref 3.5–5.1)
Sodium: 139 mmol/L (ref 135–145)
Total Bilirubin: 0.3 mg/dL (ref 0.0–1.2)
Total Protein: 6.6 g/dL (ref 6.5–8.1)

## 2024-08-31 MED ORDER — FAMOTIDINE IN NACL 20-0.9 MG/50ML-% IV SOLN
20.0000 mg | Freq: Once | INTRAVENOUS | Status: AC
  Administered 2024-08-31: 20 mg via INTRAVENOUS
  Filled 2024-08-31: qty 50

## 2024-08-31 MED ORDER — SODIUM CHLORIDE 0.9 % IV SOLN: Freq: Once | INTRAVENOUS | Status: AC

## 2024-08-31 MED ORDER — SODIUM CHLORIDE 0.9 % IV SOLN
4500.0000 mg | INTRAVENOUS | Status: DC
  Administered 2024-08-31: 4500 mg via INTRAVENOUS
  Filled 2024-08-31: qty 90

## 2024-08-31 MED ORDER — APIXABAN 5 MG PO TABS
5.0000 mg | ORAL_TABLET | Freq: Two times a day (BID) | ORAL | 5 refills | Status: DC
  Filled 2024-08-31: qty 60, 30d supply, fill #0
  Filled 2024-08-31: qty 180, 90d supply, fill #0
  Filled 2024-10-13 – 2024-10-14 (×3): qty 60, 30d supply, fill #1
  Filled 2024-11-06: qty 60, 30d supply, fill #2
  Filled 2024-12-09: qty 60, 30d supply, fill #3

## 2024-08-31 MED ORDER — DEXAMETHASONE SODIUM PHOSPHATE 10 MG/ML IJ SOLN
10.0000 mg | Freq: Once | INTRAMUSCULAR | Status: AC
  Administered 2024-08-31: 10 mg via INTRAVENOUS
  Filled 2024-08-31: qty 1

## 2024-08-31 MED ORDER — ATROPINE SULFATE 1 MG/ML IV SOLN
0.4000 mg | Freq: Once | INTRAVENOUS | Status: AC
  Administered 2024-08-31: 0.4 mg via INTRAVENOUS
  Filled 2024-08-31: qty 1

## 2024-08-31 MED ORDER — PALONOSETRON HCL INJECTION 0.25 MG/5ML
0.2500 mg | Freq: Once | INTRAVENOUS | Status: AC
  Administered 2024-08-31: 0.25 mg via INTRAVENOUS
  Filled 2024-08-31: qty 5

## 2024-08-31 MED ORDER — SODIUM CHLORIDE 0.9 % IV SOLN
50.0000 mg/m2 | Freq: Once | INTRAVENOUS | Status: AC
  Administered 2024-08-31: 98.9 mg via INTRAVENOUS
  Filled 2024-08-31: qty 23

## 2024-08-31 MED ORDER — SODIUM CHLORIDE 0.9 % IV SOLN
400.0000 mg/m2 | Freq: Once | INTRAVENOUS | Status: AC
  Administered 2024-08-31: 788 mg via INTRAVENOUS
  Filled 2024-08-31: qty 39.4

## 2024-08-31 NOTE — Patient Instructions (Signed)
 [facility] - A DEPT OF [facility]  Discharge Instructions: Thank you for choosing [facility] to provide your oncology and hematology care.   If you have a lab appointment with the Cancer Center, please go directly to the Cancer Center and check in at the registration area.   Wear comfortable clothing and clothing appropriate for easy access to any Portacath or PICC line.   We strive to give you quality time with your provider. You may need to reschedule your appointment if you arrive late (15 or more minutes).  Arriving late affects you and other patients whose appointments are after yours.  Also, if you miss three or more appointments without notifying the office, you may be dismissed from the clinic at the provider's discretion.      For prescription refill requests, have your pharmacy contact our office and allow 72 hours for refills to be completed.    Today you received the following chemotherapy and/or immunotherapy agents: Liposomal Irinotecan, Leucovorin, Fluorouracil.       To help prevent nausea and vomiting after your treatment, we encourage you to take your nausea medication as directed.  BELOW ARE SYMPTOMS THAT SHOULD BE REPORTED IMMEDIATELY: *FEVER GREATER THAN 100.4 F (38 C) OR HIGHER *CHILLS OR SWEATING *NAUSEA AND VOMITING THAT IS NOT CONTROLLED WITH YOUR NAUSEA MEDICATION *UNUSUAL SHORTNESS OF BREATH *UNUSUAL BRUISING OR BLEEDING *URINARY PROBLEMS (pain or burning when urinating, or frequent urination) *BOWEL PROBLEMS (unusual diarrhea, constipation, pain near the anus) TENDERNESS IN MOUTH AND THROAT WITH OR WITHOUT PRESENCE OF ULCERS (sore throat, sores in mouth, or a toothache) UNUSUAL RASH, SWELLING OR PAIN  UNUSUAL VAGINAL DISCHARGE OR ITCHING   Items with * indicate a potential emergency and should be followed up as soon as possible or go to the Emergency Department if any problems should occur.  Please show the  CHEMOTHERAPY ALERT CARD or IMMUNOTHERAPY ALERT CARD at check-in to the Emergency Department and triage nurse.  Should you have questions after your visit or need to cancel or reschedule your appointment, please contact [facility] - A DEPT [facility]  Dept: [phone]  and follow the prompts.  Office hours are 8:00 a.m. to 4:30 p.m. Monday - Friday. Please note that voicemails left after 4:00 p.m. may not be returned until the following business day.  We are closed weekends and major holidays. You have access to a nurse at all times for urgent questions. Please call the main number to the clinic Dept: [phone] and follow the prompts.   For any non-urgent questions, you may also contact your provider using MyChart. We now offer e-Visits for anyone 74 and older to request care online for non-urgent symptoms. For details visit [email or web address].   Also download the MyChart app! Go to the app store, search "MyChart", open the app, select [facility], and log in with your MyChart username and password.

## 2024-08-31 NOTE — Progress Notes (Signed)
 [facility]   [phone] [phone]   Clinic Follow up Note   Patient Care Team: [name], [name] as PCP - General (Nurse Practitioner) [name], MD as Consulting Physician (Hematology and Oncology) [name], MD as Consulting Physician (Radiation Oncology) [name], MD as Consulting Physician (General Surgery) [name], MD as Consulting Physician (Hematology and Oncology)  Date of Service:  08/31/2024  CHIEF COMPLAINT: f/u of pancreatic cancer  CURRENT THERAPY:  5-FU and liposomal irinotecan  every 2 weeks  Oncology History   Pancreatic cancer (HCC) -stage IV with liver mets, MSS, KRAS G12R mutation (+) -diagnosed in 10/2023 -Patient has a history of breast cancer. She presents with dyspnea, chest discomfort, decreased appetite, and weight loss. She also reports new onset back pain. -CT showed a 2.9cm mass in pancreatic neck/body, and multiple liver mets, liver biopsy confirmed adenocarcinoma  -I reviewed the aggressive nature of pancreatic cancer, and the incurable nature of her disease due to diffuse liver metastasis. -she start first line chemo Nalirifox chemotherapy regimen on 11/12, with dose reduction for first cycle due to poor PS -Performed molecular testing on tumor tissue to identify potential targeted therapy options, unfortunately no actionable mutations detected  -She underwent genetic testing and came back negative  -restaging CT 04/20/2024 showed mild improvement, chemo changed to 5-fu and liposomal irinotecan  in April 2025 -CT 07/28/2024 showed overall stable disease  Assessment & Plan Metastatic pancreatic cancer Undergoing treatment, currently on cycle eleven of chemotherapy. Reports manageable side effects, primarily diarrhea. Denies pain or discomfort. Recent labs show normal blood counts. Tolerating treatment well and maintaining daily activities. - Continue chemotherapy regimen every two weeks. - Allow  one-week break during Thanksgiving week and adjust subsequent cycle accordingly. - Order CT scan for early October to monitor disease progression.  Hypocalcemia Calcium  level is slightly low. Currently taking calcium  supplements with vitamin D .  History of PE On Eliquis  for anticoagulation management. Reports no bleeding issues. - Refill Eliquis  prescription and send to [facility] along pharmacy.  Plan - She is clinically doing well, tolerating treatment well, lab reviewed, adequate for treatment, will proceed to chemo today and continue every 2 weeks - Follow-up in 2 weeks   SUMMARY OF ONCOLOGIC HISTORY: Oncology History  Malignant neoplasm of overlapping sites of left breast in female, estrogen receptor positive (HCC)  08/04/2018 Initial Diagnosis   Screening mammogram detected left breast mass at 9:30 position 1.2 x 1.2 x 1.1 cm.  Closer to the nipple at 9:30 position 0.8 cm lesion span 3.6 cm and a 1.5 cm apart.  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 position 2 cm from nipple, at 1 cm from nipple PASH, lymph node benign, ER 90%, PR 95%, Ki-67 20%, HER-2 negative by IHC 1+, T1CN0 stage Ia AJCC 8   09/29/2018 Surgery   Left lumpectomy: Grade 3 IDC, 1.7 cm, margins negative, lymphovascular invasion present, 0/2 lymph nodes negative, T1c N0 stage Ia   10/06/2018 Cancer Staging   Staging form: Breast, AJCC 8th Edition - Pathologic: Stage IA (pT1c, pN0(sn), cM0, G3, ER+, PR+, HER2-) - Signed by [name], MD on 10/06/2018   10/22/2018 Oncotype testing   Oncotype score 13: Distant recurrence at 9 years with hormone therapy alone 4%   11/12/2018 - 12/27/2018 Radiation Therapy   Adjuvant radiation therapy   12/27/2018 -  Anti-estrogen oral therapy   Antiestrogen therapy with letrozole  2.5 mg  daily   11/30/2023 Genetic Testing   Negative genetic testing on the CancerNext-Expanded+RNAinsight panel.  VUS identified  in KIT c.2083G>C and PMS2 c.-1C>A.  The report date is 11/30/2023.  The CancerNext-Expanded gene panel offered by [facility] and includes sequencing, rearrangement, and RNA analysis for the following 76 genes: AIP, ALK, APC, ATM, AXIN2, BAP1, BARD1, BMPR1A, BRCA1, BRCA2, BRIP1, CDC73, CDH1, CDK4, CDKN1B, CDKN2A, CEBPA, CHEK2, CTNNA1, DDX41, DICER1, ETV6, FH, FLCN, GATA2, LZTR1, MAX, MBD4, MEN1, MET, MLH1, MSH2, MSH3, MSH6, MUTYH, NF1, NF2, NTHL1, PALB2, PHOX2B, PMS2, POT1, PRKAR1A, PTCH1, PTEN, RAD51C, RAD51D, RB1, RET, RUNX1, SDHA, SDHAF2, SDHB, SDHC, SDHD, SMAD4, SMARCA4, SMARCB1, SMARCE1, STK11, SUFU, TMEM127, TP53, TSC1, TSC2, VHL, and WT1 (sequencing and deletion/duplication); EGFR, HOXB13, KIT, MITF, PDGFRA, POLD1, and POLE (sequencing only); EPCAM and GREM1 (deletion/duplication only).    Pancreatic cancer (HCC)  11/03/2023 Initial Diagnosis   Pancreatic cancer (HCC)   11/11/2023 -  Chemotherapy   Patient is on Treatment Plan : PANCREAS NALIRIFOX D1, 15 Q28D     11/30/2023 Genetic Testing   Negative genetic testing on the CancerNext-Expanded+RNAinsight panel.  VUS identified in KIT c.2083G>C and PMS2 c.-1C>A.  The report date is 11/30/2023.  The CancerNext-Expanded gene panel offered by [facility] and includes sequencing, rearrangement, and RNA analysis for the following 76 genes: AIP, ALK, APC, ATM, AXIN2, BAP1, BARD1, BMPR1A, BRCA1, BRCA2, BRIP1, CDC73, CDH1, CDK4, CDKN1B, CDKN2A, CEBPA, CHEK2, CTNNA1, DDX41, DICER1, ETV6, FH, FLCN, GATA2, LZTR1, MAX, MBD4, MEN1, MET, MLH1, MSH2, MSH3, MSH6, MUTYH, NF1, NF2, NTHL1, PALB2, PHOX2B, PMS2, POT1, PRKAR1A, PTCH1, PTEN, RAD51C, RAD51D, RB1, RET, RUNX1, SDHA, SDHAF2, SDHB, SDHC, SDHD, SMAD4, SMARCA4, SMARCB1, SMARCE1, STK11, SUFU, TMEM127, TP53, TSC1, TSC2, VHL, and WT1 (sequencing and deletion/duplication); EGFR, HOXB13, KIT, MITF, PDGFRA, POLD1, and POLE (sequencing only); EPCAM and GREM1 (deletion/duplication only).    01/29/2024 Imaging   CT  abdomen and pelvis with contrast  IMPRESSION: 1. Diminished size of a hypodense mass of the pancreatic neck, consistent with treatment response. 2. Diminished size and hypodensity of numerous liver metastases, with new overlying pseudocirrhotic capsular retraction, consistent with treatment response. 3. 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 4. Unchanged prominent, nonspecific retroperitoneal lymph nodes.  5. Underlying hepatic steatosis.      Discussed the use of AI scribe software for clinical note transcription with the patient, who gave verbal consent to proceed.  History of Present Illness Ashley Clark is a 63 year old female with metastatic pancreatic cancer who presents for follow-up.  She is on cycle eleven of chemotherapy and reports no pain or discomfort. Diarrhea is not a significant issue, and her weight remains stable at 168 pounds. She experiences no bleeding and maintains good functional status.  Recent labs show normal blood counts and liver function. Electrolytes are normal except for slightly low calcium , for which she takes calcium  and vitamin D  supplements. She does not require potassium supplementation.  She requests a refill for Eliquis  and confirms adequate supply of nausea medication. She takes B12 supplements and Claritin  over the counter. Gabapentin  is available but not used due to ineffectiveness. She has not started Cymbalta .  She recently traveled to [place] for a friend's 66th birthday, indicating good social engagement. Her last CT scan was on July 28, 2024, with the next scan scheduled for early October.     All other systems were reviewed with the patient and are negative.  MEDICAL HISTORY:  Past Medical History:  Diagnosis Date   Acute pulmonary embolism (HCC) 10/29/2023  Allergy    Back pain    Cancer (HCC) 2019   left breast cancer-last radiation Dec.2019   Diabetes mellitus without complication [facility])    Medical  history non-contributory    Personal history of radiation therapy    Port-A-Cath in place - removed 12-11-2023 due to port-a-cath infection. 11/13/2023   Prediabetes    Tiredness    Vitamin D  deficiency     SURGICAL HISTORY: Past Surgical History:  Procedure Laterality Date   BREAST BIOPSY Left 08/04/2018   x3   BREAST LUMPECTOMY Left    09-29-18   BREAST LUMPECTOMY WITH RADIOACTIVE SEED AND SENTINEL LYMPH NODE BIOPSY Left 09/29/2018   Procedure: LEFT BREAST LUMPECTOMY WITH RADIOACTIVE SEED AND SENTINEL LYMPH NODE BIOPSY;  Surgeon: [name], MD;  Location: [facility];  Service: General;  Laterality: Left;   COLONOSCOPY  2016   IR IMAGING GUIDED PORT INSERTION  11/03/2023   IR PATIENT EVAL TECH 0-60 MINS  12/18/2023   IR PATIENT EVAL TECH 0-60 MINS  12/24/2023   IR PATIENT EVAL TECH 0-60 MINS  12/28/2023   IR PATIENT EVAL TECH 0-60 MINS  01/04/2024   IR PATIENT EVAL TECH 0-60 MINS  01/13/2024   IR PATIENT EVAL TECH 0-60 MINS  01/22/2024   IR RADIOLOGIST EVAL & MGMT  12/15/2023   IR REMOVAL TUN ACCESS W/ PORT W/O FL MOD SED  12/11/2023   POLYPECTOMY     WISDOM TOOTH EXTRACTION      I have reviewed the social history and family history with the patient and they are unchanged from previous note.  ALLERGIES:  is allergic to irinotecan  liposome.  MEDICATIONS:  Current Outpatient Medications  Medication Sig Dispense Refill   cetirizine (ZYRTEC) 10 MG chewable tablet Chew 10 mg by mouth daily.     lidocaine -prilocaine  (EMLA ) cream Apply to affected area once (Patient taking differently: Apply 1 Application topically See admin instructions. Apply to affected area for port access) 30 g 3   ondansetron  (ZOFRAN ) 8 MG tablet Take 1 tablet (8 mg total) by mouth every 8 (eight) hours as needed for nausea or vomiting. Start on the third day after irinotecan  30 tablet 1   prochlorperazine  (COMPAZINE ) 10 MG tablet Take 1 tablet (10 mg total) by mouth every 6 (six) hours as  needed for nausea or vomiting. 30 tablet 1   apixaban  (ELIQUIS ) 5 MG TABS tablet Take 1 tablet (5 mg total) by mouth 2 (two) times daily. 60 tablet 5   No current facility-administered medications for this visit.   Facility-Administered Medications Ordered in Other Visits  Medication Dose Route Frequency Provider Last Rate Last Admin   fluorouracil  (ADRUCIL ) 4,500 mg in sodium chloride  0.9 % 60 mL chemo infusion  4,500 mg Intravenous 1 day or 1 dose [name], MD       heparin  lock flush 100 unit/mL  500 Units Intracatheter Once PRN [name], MD       irinotecan  LIPOSOME (ONIVYDE ) 98.9 mg in sodium chloride  0.9 % 500 mL chemo infusion  50 mg/m2 (Treatment Plan Recorded) Intravenous Once [name], MD 349 mL/hr at 08/31/24 1057 98.9 mg at 08/31/24 1057   leucovorin  788 mg in sodium chloride  0.9 % 250 mL infusion  400 mg/m2 (Treatment Plan Recorded) Intravenous Once [name], MD       sodium chloride  flush (NS) 0.9 % injection 10 mL  10 mL Intracatheter PRN [name], MD        PHYSICAL EXAMINATION: ECOG PERFORMANCE  STATUS: 1 - Symptomatic but completely ambulatory  Vitals:   08/31/24 0920  BP: 116/68  Pulse: 86  Resp: 17  Temp: (!) 97.3 F (36.3 C)  SpO2: 98%   Wt Readings from Last 3 Encounters:  08/31/24 168 lb 6.4 oz (76.4 kg)  08/17/24 168 lb 4.8 oz (76.3 kg)  08/02/24 170 lb (77.1 kg)     GENERAL:alert, no distress and comfortable SKIN: skin color, texture, turgor are normal, no rashes or significant lesions EYES: normal, Conjunctiva are pink and non-injected, sclera clear NECK: supple, thyroid normal size, non-tender, without nodularity LYMPH:  no palpable lymphadenopathy in the cervical, axillary  LUNGS: clear to auscultation and percussion with normal breathing effort HEART: regular rate & rhythm and no murmurs and no lower extremity edema ABDOMEN:abdomen soft, non-tender and normal bowel sounds Musculoskeletal:no cyanosis of digits and no clubbing  NEURO: alert &  oriented x 3 with fluent speech, no focal motor/sensory deficits  Physical Exam MEASUREMENTS: Weight- 168.  LABORATORY DATA:  I have reviewed the data as listed    Latest Ref Rng & Units 08/31/2024    8:50 AM 08/17/2024    9:05 AM 08/02/2024    8:32 AM  CBC  WBC 4.0 - 10.5 K/uL 7.1  5.4  7.0   Hemoglobin 12.0 - 15.0 g/dL 87.2  87.4  87.5   Hematocrit 36.0 - 46.0 % 38.8  38.9  37.3   Platelets 150 - 400 K/uL 216  203  221         Latest Ref Rng & Units 08/31/2024    8:50 AM 08/17/2024    9:05 AM 08/02/2024    8:32 AM  CMP  Glucose 70 - 99 mg/dL 893  97  870   BUN 8 - 23 mg/dL 7  <5  6   Creatinine 9.55 - 1.00 mg/dL 9.48  9.51  9.45   Sodium 135 - 145 mmol/L 139  139  140   Potassium 3.5 - 5.1 mmol/L 3.8  3.7  3.5   Chloride 98 - 111 mmol/L 104  105  104   CO2 22 - 32 mmol/L 28  28  28    Calcium  8.9 - 10.3 mg/dL 8.8  8.8  8.9   Total Protein 6.5 - 8.1 g/dL 6.6  6.5  6.5   Total Bilirubin 0.0 - 1.2 mg/dL 0.3  0.4  0.3   Alkaline Phos 38 - 126 U/L 222  198  224   AST 15 - 41 U/L 13  13  12    ALT 0 - 44 U/L 9  8  7        RADIOGRAPHIC STUDIES: I have personally reviewed the radiological images as listed and agreed with the findings in the report. No results found.    Orders Placed This Encounter  Procedures   CBC with Differential (Cancer Center Only)    Standing Status:   Future    Expected Date:   12/28/2024    Expiration Date:   12/28/2025   CMP (Cancer Center only)    Standing Status:   Future    Expected Date:   12/28/2024    Expiration Date:   12/28/2025   CBC with Differential (Cancer Center Only)    Standing Status:   Future    Expected Date:   01/11/2025    Expiration Date:   01/11/2026   CMP (Cancer Center only)    Standing Status:   Future    Expected Date:   01/11/2025  Expiration Date:   01/11/2026   All questions were answered. The patient knows to call the clinic with any problems, questions or concerns. No barriers to learning was detected. The total  time spent in the appointment was 25 minutes, including review of chart and various tests results, discussions about plan of care and coordination of care plan     Onita Mattock, MD 08/31/2024

## 2024-09-02 ENCOUNTER — Telehealth: Payer: Self-pay

## 2024-09-02 ENCOUNTER — Inpatient Hospital Stay

## 2024-09-02 ENCOUNTER — Encounter

## 2024-09-02 VITALS — BP 111/80 | HR 67 | Temp 98.5°F | Resp 17

## 2024-09-02 DIAGNOSIS — C251 Malignant neoplasm of body of pancreas: Secondary | ICD-10-CM

## 2024-09-02 DIAGNOSIS — Z5111 Encounter for antineoplastic chemotherapy: Secondary | ICD-10-CM | POA: Diagnosis not present

## 2024-09-02 MED ORDER — PEGFILGRASTIM INJECTION 6 MG/0.6ML ~~LOC~~
6.0000 mg | PREFILLED_SYRINGE | Freq: Once | SUBCUTANEOUS | Status: AC
  Administered 2024-09-02: 6 mg via SUBCUTANEOUS
  Filled 2024-09-02: qty 0.6

## 2024-09-02 NOTE — Telephone Encounter (Signed)
 Notified the pt regarding her Disability forms being completed, faxed, and confirmation received. Pt will also need to sign an updated ROI form for medical records that [facility] is requesting. Pt stated that she has ano appt today and will pick up /sign her form.

## 2024-09-07 ENCOUNTER — Inpatient Hospital Stay

## 2024-09-07 DIAGNOSIS — Z170 Estrogen receptor positive status [ER+]: Secondary | ICD-10-CM

## 2024-09-07 DIAGNOSIS — Z5111 Encounter for antineoplastic chemotherapy: Secondary | ICD-10-CM | POA: Diagnosis not present

## 2024-09-07 MED ORDER — SODIUM CHLORIDE 0.9% FLUSH
10.0000 mL | Freq: Once | INTRAVENOUS | Status: AC
  Administered 2024-09-07: 10 mL

## 2024-09-12 ENCOUNTER — Other Ambulatory Visit: Payer: Self-pay | Admitting: Nurse Practitioner

## 2024-09-12 DIAGNOSIS — C251 Malignant neoplasm of body of pancreas: Secondary | ICD-10-CM

## 2024-09-12 NOTE — Assessment & Plan Note (Addendum)
-  stage IV with liver mets, MSS, KRAS G12R mutation (+) -diagnosed in 10/2023 -Patient has a history of breast cancer. She presents with dyspnea, chest discomfort, decreased appetite, and weight loss. She also reports new onset back pain. -CT showed a 2.9cm mass in pancreatic neck/body, and multiple liver mets, liver biopsy confirmed adenocarcinoma  -I reviewed the aggressive nature of pancreatic cancer, and the incurable nature of her disease due to diffuse liver metastasis. -she start first line chemo Nalirifox chemotherapy regimen on 11/12, with dose reduction for first cycle due to poor PS -Performed molecular testing on tumor tissue to identify potential targeted therapy options, unfortunately no actionable mutations detected  -She underwent genetic testing and came back negative  -restaging CT 04/20/2024 showed mild improvement, chemo changed to 5-fu and liposomal irinotecan  in April 2025 -CT 07/28/2024 showed overall stable disease - 09/13/2024 -restaging CT CAP ordered as part of today's visit.  Should be done in early October. --Continue with chemotherapy 5-FU pump and liposomal irinotecan  every 2 weeks.

## 2024-09-12 NOTE — Progress Notes (Signed)
 Patient Care Team: [name], [name], [name] as PCP - General (Nurse Practitioner) [name], MD as Consulting Physician (Hematology and Oncology) [name], MD as Consulting Physician (Radiation Oncology) [name], MD as Consulting Physician (General Surgery) [name], MD as Consulting Physician (Hematology and Oncology)  Clinic Day:  09/13/2024  Referring physician: [name], [name], PA  ASSESSMENT & PLAN:   Assessment & Plan: Pancreatic cancer (HCC) -stage IV with liver mets, MSS, KRAS G12R mutation (+) -diagnosed in 10/2023 -Patient has a history of breast cancer. She presents with dyspnea, chest discomfort, decreased appetite, and weight loss. She also reports new onset back pain. -CT showed a 2.9cm mass in pancreatic neck/body, and multiple liver mets, liver biopsy confirmed adenocarcinoma  -I reviewed the aggressive nature of pancreatic cancer, and the incurable nature of her disease due to diffuse liver metastasis. -she start first line chemo Nalirifox chemotherapy regimen on 11/12, with dose reduction for first cycle due to poor PS -Performed molecular testing on tumor tissue to identify potential targeted therapy options, unfortunately no actionable mutations detected  -She underwent genetic testing and came back negative  -restaging CT 04/20/2024 showed mild improvement, chemo changed to 5-fu and liposomal irinotecan  in April 2025 -CT 07/28/2024 showed overall stable disease - 09/13/2024 -restaging CT CAP ordered as part of today's visit.  Should be done in early October. --Continue with chemotherapy 5-FU pump and liposomal irinotecan  every 2 weeks.   Nausea This is occurring mostly on day of pump DC.  She does have Compazine  she can take as needed.  She stays hydrated with Gatorade, liquid IV, and other electrolyte supplements.  Constipation/diarrhea She states she has more constipation than diarrhea.  Manages constipation with apple juice, MiraLAX, and senna.  If/when  diarrhea develops, she does use Imodium OTC which is helpful.  Baseline neuropathy Side effects from previous chemotherapy.  Not getting worse.  Gabapentin  not very helpful.  Taking magnesium and B12 supplements which helped more.  States she is managing this well overall.  Fatigue Likely side effect from chemotherapy.  She states this is most significant during first 2 days after chemotherapy.  She is able to participate in her regular activities and stay active in things she finds enjoyable.  She has good support system and they keep her busy and involved in social activities.  Plan Labs reviewed. - CBC unremarkable - CMP unremarkable other than elevated ALKP at 206.. - CA 19-9 pending. Due for restaging CT CAP in early October.  Ordered as part of today's visit. Proceed with cycle 12 of 5-FU pump and liposomal irinotecan . Plan for labs/flush, follow-up, and next treatment in 2 weeks as scheduled.  The patient understands the plans discussed today and is in agreement with them.  She knows to contact our office if she develops concerns prior to her next appointment.  I provided 25 minutes of face-to-face time during this encounter and > 50% was spent counseling as documented under my assessment and plan.    Powell FORBES Lessen, NP  [facility] - [facility] DE[facility] [address] [place] [address] Dept: [phone] Dept Fax: [phone]   Orders Placed This Encounter  Procedures   CT CHEST ABDOMEN PELVIS W CONTRAST    Standing Status:   Future    Expected Date:   10/04/2024    Expiration Date:   09/13/2025    If indicated for the ordered procedure, I authorize the administration of  contrast media per Radiology protocol:   Yes    Does the patient have a contrast media/X-ray dye allergy?:   No    If indicated for the ordered procedure, I authorize the administration of oral contrast media per Radiology protocol:    Yes    Preferred imaging location?:   [facility]      CHIEF COMPLAINT:  CC: F/U pancreatic cancer  Current Treatment: 5-FU and liposomal irinotecan  every 2 weeks  INTERVAL HISTORY:  Anelise is here today for repeat clinical assessment. She last saw Dr. Lanny on 08/30/2024.  She continues to tolerate current chemotherapy regimen with manageable side effects.l Most significant side effects she has are diarrhea and fatigue. Today, she presents for cycle 12 day 1 chemotherapy 5-FU and liposomal irinotecan .  She should be scheduled for CT CAP in early October.  Will order as part of today's visit.  She denies chest pain, chest pressure, or shortness of breath. She denies headaches or visual disturbances. She denies abdominal pain, nausea, vomiting, or changes in bowel or bladder habits.  She denies fevers or chills. She denies pain. Her appetite is fair. Goes up and down. Her weight has been stable.  I have reviewed the past medical history, past surgical history, social history and family history with the patient and they are unchanged from previous note.  ALLERGIES:  is allergic to irinotecan  liposome.  MEDICATIONS:  Current Outpatient Medications  Medication Sig Dispense Refill   apixaban  (ELIQUIS ) 5 MG TABS tablet Take 1 tablet (5 mg total) by mouth 2 (two) times daily. 60 tablet 5   cetirizine (ZYRTEC) 10 MG chewable tablet Chew 10 mg by mouth daily.     lidocaine -prilocaine  (EMLA ) cream Apply to affected area once (Patient taking differently: Apply 1 Application topically See admin instructions. Apply to affected area for port access) 30 g 3   ondansetron  (ZOFRAN ) 8 MG tablet Take 1 tablet (8 mg total) by mouth every 8 (eight) hours as needed for nausea or vomiting. Start on the third day after irinotecan  30 tablet 1   prochlorperazine  (COMPAZINE ) 10 MG tablet Take 1 tablet (10 mg total) by mouth every 6 (six) hours as needed for nausea or vomiting. 30 tablet 1   No current  facility-administered medications for this visit.   Facility-Administered Medications Ordered in Other Visits  Medication Dose Route Frequency Provider Last Rate Last Admin   heparin  lock flush 100 unit/mL  500 Units Intracatheter Once PRN [name], MD       sodium chloride  flush (NS) 0.9 % injection 10 mL  10 mL Intracatheter PRN [name], MD        HISTORY OF PRESENT ILLNESS:   Oncology History  Malignant neoplasm of overlapping sites of left breast in female, estrogen receptor positive (HCC)  08/04/2018 Initial Diagnosis   Screening mammogram detected left breast mass at 9:30 position 1.2 x 1.2 x 1.1 cm.  Closer to the nipple at 9:30 position 0.8 cm lesion span 3.6 cm and a 1.5 cm apart.  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 position 2 cm from nipple, at 1 cm from nipple PASH, lymph node benign, ER 90%, PR 95%, Ki-67 20%, HER-2 negative by IHC 1+, T1CN0 stage Ia AJCC 8   09/29/2018 Surgery   Left lumpectomy: Grade 3 IDC, 1.7 cm, margins negative, lymphovascular invasion present, 0/2 lymph nodes negative, T1c N0 stage Ia  10/06/2018 Cancer Staging   Staging form: Breast, AJCC 8th Edition - Pathologic: Stage IA (pT1c, pN0(sn), cM0, G3, ER+, PR+, HER2-) - Signed by [name], MD on 10/06/2018   10/22/2018 Oncotype testing   Oncotype score 13: Distant recurrence at 9 years with hormone therapy alone 4%   11/12/2018 - 12/27/2018 Radiation Therapy   Adjuvant radiation therapy   12/27/2018 -  Anti-estrogen oral therapy   Antiestrogen therapy with letrozole  2.5 mg daily   11/30/2023 Genetic Testing   Negative genetic testing on the CancerNext-Expanded+RNAinsight panel.  VUS identified in KIT c.2083G>C and PMS2 c.-1C>A.  The report date is 11/30/2023.  The CancerNext-Expanded gene panel offered by [facility] and includes sequencing, rearrangement, and RNA analysis for the following 76 genes: AIP, ALK, APC,  ATM, AXIN2, BAP1, BARD1, BMPR1A, BRCA1, BRCA2, BRIP1, CDC73, CDH1, CDK4, CDKN1B, CDKN2A, CEBPA, CHEK2, CTNNA1, DDX41, DICER1, ETV6, FH, FLCN, GATA2, LZTR1, MAX, MBD4, MEN1, MET, MLH1, MSH2, MSH3, MSH6, MUTYH, NF1, NF2, NTHL1, PALB2, PHOX2B, PMS2, POT1, PRKAR1A, PTCH1, PTEN, RAD51C, RAD51D, RB1, RET, RUNX1, SDHA, SDHAF2, SDHB, SDHC, SDHD, SMAD4, SMARCA4, SMARCB1, SMARCE1, STK11, SUFU, TMEM127, TP53, TSC1, TSC2, VHL, and WT1 (sequencing and deletion/duplication); EGFR, HOXB13, KIT, MITF, PDGFRA, POLD1, and POLE (sequencing only); EPCAM and GREM1 (deletion/duplication only).    Pancreatic cancer (HCC)  11/03/2023 Initial Diagnosis   Pancreatic cancer (HCC)   11/11/2023 -  Chemotherapy   Patient is on Treatment Plan : PANCREAS NALIRIFOX D1, 15 Q28D     11/30/2023 Genetic Testing   Negative genetic testing on the CancerNext-Expanded+RNAinsight panel.  VUS identified in KIT c.2083G>C and PMS2 c.-1C>A.  The report date is 11/30/2023.  The CancerNext-Expanded gene panel offered by [facility] and includes sequencing, rearrangement, and RNA analysis for the following 76 genes: AIP, ALK, APC, ATM, AXIN2, BAP1, BARD1, BMPR1A, BRCA1, BRCA2, BRIP1, CDC73, CDH1, CDK4, CDKN1B, CDKN2A, CEBPA, CHEK2, CTNNA1, DDX41, DICER1, ETV6, FH, FLCN, GATA2, LZTR1, MAX, MBD4, MEN1, MET, MLH1, MSH2, MSH3, MSH6, MUTYH, NF1, NF2, NTHL1, PALB2, PHOX2B, PMS2, POT1, PRKAR1A, PTCH1, PTEN, RAD51C, RAD51D, RB1, RET, RUNX1, SDHA, SDHAF2, SDHB, SDHC, SDHD, SMAD4, SMARCA4, SMARCB1, SMARCE1, STK11, SUFU, TMEM127, TP53, TSC1, TSC2, VHL, and WT1 (sequencing and deletion/duplication); EGFR, HOXB13, KIT, MITF, PDGFRA, POLD1, and POLE (sequencing only); EPCAM and GREM1 (deletion/duplication only).    01/29/2024 Imaging   CT abdomen and pelvis with contrast  IMPRESSION: 1. Diminished size of a hypodense mass of the pancreatic neck, consistent with treatment response. 2. Diminished size and hypodensity of numerous liver metastases, with new  overlying pseudocirrhotic capsular retraction, consistent with treatment response. 3. 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 4. Unchanged prominent, nonspecific retroperitoneal lymph nodes.  5. Underlying hepatic steatosis.       REVIEW OF SYSTEMS:   Constitutional: Denies fevers, chills or abnormal weight loss Eyes: Denies blurriness of vision Ears, nose, mouth, throat, and face: Denies mucositis or sore throat Respiratory: Denies cough, dyspnea or wheezes Cardiovascular: Denies palpitation, chest discomfort or lower extremity swelling Gastrointestinal:  Denies nausea, heartburn or change in bowel habits. Mild diarrhea, mostly occurring the first three days after chemotherapy treatment.  Skin: Denies abnormal skin rashes Lymphatics: Denies new lymphadenopathy or easy bruising Neurological:Denies numbness, tingling or new weaknesses Behavioral/Psych: Mood is stable, no new changes  All other systems were reviewed with the patient and are negative.   VITALS:   Today's Vitals   09/13/24 0800  BP: 108/78  Pulse: 80  Resp: 17  Temp: 98.4 F (36.9 C)  TempSrc: Temporal  SpO2:  95%  Weight: 169 lb 6.4 oz (76.8 kg)  Height: 5' 1.5 (1.562 m)  PainSc: 0-No pain   Body mass index is 31.49 kg/m.   Wt Readings from Last 3 Encounters:  09/13/24 169 lb 6.4 oz (76.8 kg)  08/31/24 168 lb 6.4 oz (76.4 kg)  08/17/24 168 lb 4.8 oz (76.3 kg)    Body mass index is 31.49 kg/m.  Performance status (ECOG): 1 - Symptomatic but completely ambulatory  PHYSICAL EXAM:   GENERAL:alert, no distress and comfortable SKIN: skin color, texture, turgor are normal, no rashes or significant lesions EYES: normal, Conjunctiva are pink and non-injected, sclera clear OROPHARYNX:no exudate, no erythema and lips, buccal mucosa, and tongue normal  NECK: supple, thyroid normal size, non-tender, without nodularity LYMPH:  no palpable lymphadenopathy in the cervical, axillary or  inguinal LUNGS: clear to auscultation and percussion with normal breathing effort HEART: regular rate & rhythm and no murmurs and no lower extremity edema ABDOMEN:abdomen soft, non-tender and normal bowel sounds Musculoskeletal:no cyanosis of digits and no clubbing  NEURO: alert & oriented x 3 with fluent speech, no focal motor/sensory deficits  LABORATORY DATA:  I have reviewed the data as listed    Component Value Date/Time   NA 139 09/13/2024 0825   NA 136 06/10/2022 1152   K 3.8 09/13/2024 0825   CL 106 09/13/2024 0825   CO2 28 09/13/2024 0825   GLUCOSE 104 (H) 09/13/2024 0825   BUN 5 (L) 09/13/2024 0825   BUN 7 (L) 06/10/2022 1152   CREATININE 0.50 09/13/2024 0825   CALCIUM  9.0 09/13/2024 0825   PROT 6.4 (L) 09/13/2024 0825   PROT 7.6 06/10/2022 1152   ALBUMIN 3.8 09/13/2024 0825   ALBUMIN 4.6 06/10/2022 1152   AST 11 (L) 09/13/2024 0825   ALT 8 09/13/2024 0825   ALKPHOS 206 (H) 09/13/2024 0825   BILITOT 0.4 09/13/2024 0825   GFRNONAA >60 09/13/2024 0825    Lab Results  Component Value Date   WBC 5.6 09/13/2024   NEUTROABS 3.7 09/13/2024   HGB 12.5 09/13/2024   HCT 38.3 09/13/2024   MCV 86.3 09/13/2024   PLT 232 09/13/2024

## 2024-09-13 ENCOUNTER — Inpatient Hospital Stay

## 2024-09-13 ENCOUNTER — Inpatient Hospital Stay (HOSPITAL_BASED_OUTPATIENT_CLINIC_OR_DEPARTMENT_OTHER): Admitting: Nurse Practitioner

## 2024-09-13 VITALS — BP 108/78 | HR 80 | Temp 98.4°F | Resp 17 | Ht 61.5 in | Wt 169.4 lb

## 2024-09-13 DIAGNOSIS — C251 Malignant neoplasm of body of pancreas: Secondary | ICD-10-CM | POA: Diagnosis not present

## 2024-09-13 DIAGNOSIS — Z5111 Encounter for antineoplastic chemotherapy: Secondary | ICD-10-CM | POA: Diagnosis not present

## 2024-09-13 DIAGNOSIS — C259 Malignant neoplasm of pancreas, unspecified: Secondary | ICD-10-CM | POA: Diagnosis not present

## 2024-09-13 LAB — CMP (CANCER CENTER ONLY)
ALT: 8 U/L (ref 0–44)
AST: 11 U/L — ABNORMAL LOW (ref 15–41)
Albumin: 3.8 g/dL (ref 3.5–5.0)
Alkaline Phosphatase: 206 U/L — ABNORMAL HIGH (ref 38–126)
Anion gap: 5 (ref 5–15)
BUN: 5 mg/dL — ABNORMAL LOW (ref 8–23)
CO2: 28 mmol/L (ref 22–32)
Calcium: 9 mg/dL (ref 8.9–10.3)
Chloride: 106 mmol/L (ref 98–111)
Creatinine: 0.5 mg/dL (ref 0.44–1.00)
GFR, Estimated: >60 mL/min (ref >60)
Glucose, Bld: 104 mg/dL — ABNORMAL HIGH (ref 70–99)
Potassium: 3.8 mmol/L (ref 3.5–5.1)
Sodium: 139 mmol/L (ref 135–145)
Total Bilirubin: 0.4 mg/dL (ref 0.0–1.2)
Total Protein: 6.4 g/dL — ABNORMAL LOW (ref 6.5–8.1)

## 2024-09-13 LAB — CBC WITH DIFFERENTIAL (CANCER CENTER ONLY)
Abs Immature Granulocytes: 0.05 K/uL (ref 0.00–0.07)
Basophils Absolute: 0 K/uL (ref 0.0–0.1)
Basophils Relative: 1 %
Eosinophils Absolute: 0.1 K/uL (ref 0.0–0.5)
Eosinophils Relative: 2 %
HCT: 38.3 % (ref 36.0–46.0)
Hemoglobin: 12.5 g/dL (ref 12.0–15.0)
Immature Granulocytes: 1 %
Lymphocytes Relative: 21 %
Lymphs Abs: 1.2 K/uL (ref 0.7–4.0)
MCH: 28.2 pg (ref 26.0–34.0)
MCHC: 32.6 g/dL (ref 30.0–36.0)
MCV: 86.3 fL (ref 80.0–100.0)
Monocytes Absolute: 0.6 K/uL (ref 0.1–1.0)
Monocytes Relative: 11 %
Neutro Abs: 3.7 K/uL (ref 1.7–7.7)
Neutrophils Relative %: 64 %
Platelet Count: 232 K/uL (ref 150–400)
RBC: 4.44 MIL/uL (ref 3.87–5.11)
RDW: 15.3 % (ref 11.5–15.5)
WBC Count: 5.6 K/uL (ref 4.0–10.5)
nRBC: 0 % (ref 0.0–0.2)

## 2024-09-13 MED ORDER — SODIUM CHLORIDE 0.9 % IV SOLN
50.0000 mg/m2 | Freq: Once | INTRAVENOUS | Status: AC
  Administered 2024-09-13: 98.9 mg via INTRAVENOUS
  Filled 2024-09-13: qty 23

## 2024-09-13 MED ORDER — ATROPINE SULFATE 1 MG/ML IV SOLN
0.4000 mg | Freq: Once | INTRAVENOUS | Status: AC
  Administered 2024-09-13: 0.4 mg via INTRAVENOUS
  Filled 2024-09-13: qty 1

## 2024-09-13 MED ORDER — SODIUM CHLORIDE 0.9 % IV SOLN
4500.0000 mg | INTRAVENOUS | Status: DC
  Administered 2024-09-13: 4500 mg via INTRAVENOUS
  Filled 2024-09-13: qty 90

## 2024-09-13 MED ORDER — DEXAMETHASONE SODIUM PHOSPHATE 10 MG/ML IJ SOLN
10.0000 mg | Freq: Once | INTRAMUSCULAR | Status: AC
  Administered 2024-09-13: 10 mg via INTRAVENOUS
  Filled 2024-09-13: qty 1

## 2024-09-13 MED ORDER — SODIUM CHLORIDE 0.9 % IV SOLN
400.0000 mg/m2 | Freq: Once | INTRAVENOUS | Status: AC
  Administered 2024-09-13: 788 mg via INTRAVENOUS
  Filled 2024-09-13: qty 25

## 2024-09-13 MED ORDER — FAMOTIDINE IN NACL 20-0.9 MG/50ML-% IV SOLN
20.0000 mg | Freq: Once | INTRAVENOUS | Status: AC
  Administered 2024-09-13: 20 mg via INTRAVENOUS
  Filled 2024-09-13: qty 50

## 2024-09-13 MED ORDER — DEXTROSE 5 % IV SOLN: INTRAVENOUS | Status: DC

## 2024-09-13 MED ORDER — PALONOSETRON HCL INJECTION 0.25 MG/5ML
0.2500 mg | Freq: Once | INTRAVENOUS | Status: AC
  Administered 2024-09-13: 0.25 mg via INTRAVENOUS
  Filled 2024-09-13: qty 5

## 2024-09-13 MED ORDER — SODIUM CHLORIDE 0.9 % IV SOLN: Freq: Once | INTRAVENOUS | Status: AC

## 2024-09-13 NOTE — Patient Instructions (Signed)
 [facility] - A DEPT OF [facility]  Discharge Instructions: Thank you for choosing [facility] to provide your oncology and hematology care.   If you have a lab appointment with the Cancer Center, please go directly to the Cancer Center and check in at the registration area.   Wear comfortable clothing and clothing appropriate for easy access to any Portacath or PICC line.   We strive to give you quality time with your provider. You may need to reschedule your appointment if you arrive late (15 or more minutes).  Arriving late affects you and other patients whose appointments are after yours.  Also, if you miss three or more appointments without notifying the office, you may be dismissed from the clinic at the provider's discretion.      For prescription refill requests, have your pharmacy contact our office and allow 72 hours for refills to be completed.    Today you received the following chemotherapy and/or immunotherapy agents: Liposomal Irinotecan, Leucovorin, Fluorouracil.       To help prevent nausea and vomiting after your treatment, we encourage you to take your nausea medication as directed.  BELOW ARE SYMPTOMS THAT SHOULD BE REPORTED IMMEDIATELY: *FEVER GREATER THAN 100.4 F (38 C) OR HIGHER *CHILLS OR SWEATING *NAUSEA AND VOMITING THAT IS NOT CONTROLLED WITH YOUR NAUSEA MEDICATION *UNUSUAL SHORTNESS OF BREATH *UNUSUAL BRUISING OR BLEEDING *URINARY PROBLEMS (pain or burning when urinating, or frequent urination) *BOWEL PROBLEMS (unusual diarrhea, constipation, pain near the anus) TENDERNESS IN MOUTH AND THROAT WITH OR WITHOUT PRESENCE OF ULCERS (sore throat, sores in mouth, or a toothache) UNUSUAL RASH, SWELLING OR PAIN  UNUSUAL VAGINAL DISCHARGE OR ITCHING   Items with * indicate a potential emergency and should be followed up as soon as possible or go to the Emergency Department if any problems should occur.  Please show the  CHEMOTHERAPY ALERT CARD or IMMUNOTHERAPY ALERT CARD at check-in to the Emergency Department and triage nurse.  Should you have questions after your visit or need to cancel or reschedule your appointment, please contact [facility] - A DEPT [facility]  Dept: [phone]  and follow the prompts.  Office hours are 8:00 a.m. to 4:30 p.m. Monday - Friday. Please note that voicemails left after 4:00 p.m. may not be returned until the following business day.  We are closed weekends and major holidays. You have access to a nurse at all times for urgent questions. Please call the main number to the clinic Dept: [phone] and follow the prompts.   For any non-urgent questions, you may also contact your provider using MyChart. We now offer e-Visits for anyone 74 and older to request care online for non-urgent symptoms. For details visit [email or web address].   Also download the MyChart app! Go to the app store, search "MyChart", open the app, select [facility], and log in with your MyChart username and password.

## 2024-09-14 LAB — CANCER ANTIGEN 19-9: CA 19-9: 604 U/mL — ABNORMAL HIGH (ref 0–35)

## 2024-09-15 ENCOUNTER — Inpatient Hospital Stay

## 2024-09-15 DIAGNOSIS — Z5111 Encounter for antineoplastic chemotherapy: Secondary | ICD-10-CM | POA: Diagnosis not present

## 2024-09-15 DIAGNOSIS — C251 Malignant neoplasm of body of pancreas: Secondary | ICD-10-CM

## 2024-09-15 MED ORDER — PEGFILGRASTIM INJECTION 6 MG/0.6ML ~~LOC~~
6.0000 mg | PREFILLED_SYRINGE | Freq: Once | SUBCUTANEOUS | Status: AC
  Administered 2024-09-15: 6 mg via SUBCUTANEOUS
  Filled 2024-09-15: qty 0.6

## 2024-09-20 ENCOUNTER — Encounter: Payer: Self-pay | Admitting: Nurse Practitioner

## 2024-09-20 ENCOUNTER — Encounter: Payer: Self-pay | Admitting: Hematology

## 2024-09-21 ENCOUNTER — Inpatient Hospital Stay

## 2024-09-27 NOTE — Assessment & Plan Note (Signed)
-  stage IV with liver mets, MSS, KRAS G12R mutation (+) -diagnosed in 10/2023 -Patient has a history of breast cancer. She presents with dyspnea, chest discomfort, decreased appetite, and weight loss. She also reports new onset back pain. -CT showed a 2.9cm mass in pancreatic neck/body, and multiple liver mets, liver biopsy confirmed adenocarcinoma  -I reviewed the aggressive nature of pancreatic cancer, and the incurable nature of her disease due to diffuse liver metastasis. -she start first line chemo Nalirifox chemotherapy regimen on 11/12, with dose reduction for first cycle due to poor PS -Performed molecular testing on tumor tissue to identify potential targeted therapy options, unfortunately no actionable mutations detected  -She underwent genetic testing and came back negative  -restaging CT 04/20/2024 showed mild improvement, chemo changed to 5-fu and liposomal irinotecan  in April 2025 -CT 07/28/2024 showed overall stable disease

## 2024-09-28 ENCOUNTER — Encounter: Payer: Self-pay | Admitting: Hematology

## 2024-09-28 ENCOUNTER — Inpatient Hospital Stay

## 2024-09-28 ENCOUNTER — Inpatient Hospital Stay: Attending: Hematology

## 2024-09-28 ENCOUNTER — Inpatient Hospital Stay (HOSPITAL_BASED_OUTPATIENT_CLINIC_OR_DEPARTMENT_OTHER): Admitting: Hematology

## 2024-09-28 VITALS — BP 104/64 | HR 70 | Temp 98.1°F | Resp 16 | Ht 61.5 in | Wt 167.9 lb

## 2024-09-28 DIAGNOSIS — C251 Malignant neoplasm of body of pancreas: Secondary | ICD-10-CM | POA: Diagnosis not present

## 2024-09-28 DIAGNOSIS — C50812 Malignant neoplasm of overlapping sites of left female breast: Secondary | ICD-10-CM

## 2024-09-28 DIAGNOSIS — C787 Secondary malignant neoplasm of liver and intrahepatic bile duct: Secondary | ICD-10-CM | POA: Insufficient documentation

## 2024-09-28 DIAGNOSIS — Z5189 Encounter for other specified aftercare: Secondary | ICD-10-CM | POA: Diagnosis not present

## 2024-09-28 DIAGNOSIS — C258 Malignant neoplasm of overlapping sites of pancreas: Secondary | ICD-10-CM | POA: Diagnosis not present

## 2024-09-28 DIAGNOSIS — Z5111 Encounter for antineoplastic chemotherapy: Secondary | ICD-10-CM | POA: Diagnosis present

## 2024-09-28 DIAGNOSIS — Z79631 Long term (current) use of antimetabolite agent: Secondary | ICD-10-CM | POA: Diagnosis not present

## 2024-09-28 DIAGNOSIS — Z79634 Long term (current) use of topoisomerase inhibitor: Secondary | ICD-10-CM | POA: Diagnosis not present

## 2024-09-28 DIAGNOSIS — C259 Malignant neoplasm of pancreas, unspecified: Secondary | ICD-10-CM

## 2024-09-28 LAB — CBC WITH DIFFERENTIAL (CANCER CENTER ONLY)
Abs Immature Granulocytes: 0.05 K/uL (ref 0.00–0.07)
Basophils Absolute: 0 K/uL (ref 0.0–0.1)
Basophils Relative: 0 %
Eosinophils Absolute: 0.1 K/uL (ref 0.0–0.5)
Eosinophils Relative: 1 %
HCT: 37.8 % (ref 36.0–46.0)
Hemoglobin: 12.5 g/dL (ref 12.0–15.0)
Immature Granulocytes: 1 %
Lymphocytes Relative: 15 %
Lymphs Abs: 0.9 K/uL (ref 0.7–4.0)
MCH: 28.3 pg (ref 26.0–34.0)
MCHC: 33.1 g/dL (ref 30.0–36.0)
MCV: 85.5 fL (ref 80.0–100.0)
Monocytes Absolute: 0.6 K/uL (ref 0.1–1.0)
Monocytes Relative: 11 %
Neutro Abs: 4.4 K/uL (ref 1.7–7.7)
Neutrophils Relative %: 72 %
Platelet Count: 210 K/uL (ref 150–400)
RBC: 4.42 MIL/uL (ref 3.87–5.11)
RDW: 14.8 % (ref 11.5–15.5)
WBC Count: 6.1 K/uL (ref 4.0–10.5)
nRBC: 0 % (ref 0.0–0.2)

## 2024-09-28 LAB — CMP (CANCER CENTER ONLY)
ALT: 6 U/L (ref 0–44)
AST: 11 U/L — ABNORMAL LOW (ref 15–41)
Albumin: 3.9 g/dL (ref 3.5–5.0)
Alkaline Phosphatase: 179 U/L — ABNORMAL HIGH (ref 38–126)
Anion gap: 5 (ref 5–15)
BUN: 9 mg/dL (ref 8–23)
CO2: 28 mmol/L (ref 22–32)
Calcium: 8.8 mg/dL — ABNORMAL LOW (ref 8.9–10.3)
Chloride: 104 mmol/L (ref 98–111)
Creatinine: 0.66 mg/dL (ref 0.44–1.00)
GFR, Estimated: >60 mL/min (ref >60)
Glucose, Bld: 127 mg/dL — ABNORMAL HIGH (ref 70–99)
Potassium: 3.9 mmol/L (ref 3.5–5.1)
Sodium: 137 mmol/L (ref 135–145)
Total Bilirubin: 0.3 mg/dL (ref 0.0–1.2)
Total Protein: 6.5 g/dL (ref 6.5–8.1)

## 2024-09-28 MED ORDER — SODIUM CHLORIDE 0.9 % IV SOLN
50.0000 mg/m2 | Freq: Once | INTRAVENOUS | Status: AC
  Administered 2024-09-28: 98.9 mg via INTRAVENOUS
  Filled 2024-09-28: qty 23

## 2024-09-28 MED ORDER — FAMOTIDINE IN NACL 20-0.9 MG/50ML-% IV SOLN
20.0000 mg | Freq: Once | INTRAVENOUS | Status: AC
  Administered 2024-09-28: 20 mg via INTRAVENOUS
  Filled 2024-09-28: qty 50

## 2024-09-28 MED ORDER — DEXAMETHASONE SODIUM PHOSPHATE 10 MG/ML IJ SOLN
10.0000 mg | Freq: Once | INTRAMUSCULAR | Status: AC
  Administered 2024-09-28: 10 mg via INTRAVENOUS
  Filled 2024-09-28: qty 1

## 2024-09-28 MED ORDER — ATROPINE SULFATE 1 MG/ML IV SOLN
0.4000 mg | Freq: Once | INTRAVENOUS | Status: AC
  Administered 2024-09-28: 0.4 mg via INTRAVENOUS
  Filled 2024-09-28: qty 1

## 2024-09-28 MED ORDER — SODIUM CHLORIDE 0.9 % IV SOLN: Freq: Once | INTRAVENOUS | Status: AC

## 2024-09-28 MED ORDER — SODIUM CHLORIDE 0.9 % IV SOLN
4500.0000 mg | INTRAVENOUS | Status: DC
  Administered 2024-09-28: 4500 mg via INTRAVENOUS
  Filled 2024-09-28: qty 90

## 2024-09-28 MED ORDER — SODIUM CHLORIDE 0.9 % IV SOLN
400.0000 mg/m2 | Freq: Once | INTRAVENOUS | Status: AC
  Administered 2024-09-28: 788 mg via INTRAVENOUS
  Filled 2024-09-28: qty 25

## 2024-09-28 MED ORDER — PALONOSETRON HCL INJECTION 0.25 MG/5ML
0.2500 mg | Freq: Once | INTRAVENOUS | Status: AC
  Administered 2024-09-28: 0.25 mg via INTRAVENOUS
  Filled 2024-09-28: qty 5

## 2024-09-29 ENCOUNTER — Encounter: Payer: Self-pay | Admitting: Hematology

## 2024-09-29 NOTE — Progress Notes (Signed)
 [facility]   [phone] [phone]   Clinic Follow up Note   Patient Care Team: [name], [name] as PCP - General (Nurse Practitioner) [name], MD as Consulting Physician (Hematology and Oncology) [name], MD as Consulting Physician (Radiation Oncology) [name], MD as Consulting Physician (General Surgery) [name], MD as Consulting Physician (Hematology and Oncology)  Date of Service:  09/28/2024  CHIEF COMPLAINT: f/u of pancreatic cancer  CURRENT THERAPY:  5-FU and liposomal irinotecan  every 2 weeks  Oncology History   Pancreatic cancer (HCC) -stage IV with liver mets, MSS, KRAS G12R mutation (+) -diagnosed in 10/2023 -Patient has a history of breast cancer. She presents with dyspnea, chest discomfort, decreased appetite, and weight loss. She also reports new onset back pain. -CT showed a 2.9cm mass in pancreatic neck/body, and multiple liver mets, liver biopsy confirmed adenocarcinoma  -I reviewed the aggressive nature of pancreatic cancer, and the incurable nature of her disease due to diffuse liver metastasis. -she start first line chemo Nalirifox chemotherapy regimen on 11/12, with dose reduction for first cycle due to poor PS -Performed molecular testing on tumor tissue to identify potential targeted therapy options, unfortunately no actionable mutations detected  -She underwent genetic testing and came back negative  -restaging CT 04/20/2024 showed mild improvement, chemo changed to 5-fu and liposomal irinotecan  in April 2025 -CT 07/28/2024 showed overall stable disease  Assessment & Plan Malignant neoplasm of body of pancreas Pancreatic cancer with well-managed clinical status. Tumor marker CA 19-9 increased from 200-300 to 604, suggesting potential disease progression. Blood counts and liver function tests are normal, with slightly low calcium  level. On long-term disability and considering retirement by year-end.  Discussed potential treatment resistance and alternative options. Emphasized tumor markers may not fully indicate disease status. - Continue current treatment regimen - Perform scheduled scan in three weeks - Repeat tumor marker test in two weeks - Evaluate scan and tumor marker results in four weeks to determine treatment plan - Ensure adequate supply of Eliquis  and nausea medication - Continue weekly PICC line maintenance  Plan - She is clinically stable, labs reviewed, adequate for treatment, will proceed to chemo today and continue every 2 weeks - Her tumor marker CA 19.9 has doubled recently, concerning for disease progression.  She is scheduled for restaging CT scan on October 22. - Follow-up in 2 weeks before next cycle chemo.    SUMMARY OF ONCOLOGIC HISTORY: Oncology History  Malignant neoplasm of overlapping sites of left breast in female, estrogen receptor positive (HCC)  08/04/2018 Initial Diagnosis   Screening mammogram detected left breast mass at 9:30 position 1.2 x 1.2 x 1.1 cm.  Closer to the nipple at 9:30 position 0.8 cm lesion span 3.6 cm and a 1.5 cm apart.  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 position 2 cm from nipple, at 1 cm from nipple PASH, lymph node benign, ER 90%, PR 95%, Ki-67 20%, HER-2 negative by IHC 1+, T1CN0 stage Ia AJCC 8   09/29/2018 Surgery   Left lumpectomy: Grade 3 IDC, 1.7 cm, margins negative, lymphovascular invasion present, 0/2 lymph nodes negative, T1c N0 stage Ia   10/06/2018 Cancer Staging   Staging form: Breast, AJCC 8th Edition - Pathologic: Stage IA (pT1c, pN0(sn), cM0, G3, ER+, PR+, HER2-) - Signed by [name], MD on 10/06/2018   10/22/2018 Oncotype testing   Oncotype score 13: Distant recurrence at  9 years with hormone therapy alone 4%   11/12/2018 - 12/27/2018 Radiation Therapy   Adjuvant radiation therapy   12/27/2018 -  Anti-estrogen oral therapy    Antiestrogen therapy with letrozole  2.5 mg daily   11/30/2023 Genetic Testing   Negative genetic testing on the CancerNext-Expanded+RNAinsight panel.  VUS identified in KIT c.2083G>C and PMS2 c.-1C>A.  The report date is 11/30/2023.  The CancerNext-Expanded gene panel offered by [facility] and includes sequencing, rearrangement, and RNA analysis for the following 76 genes: AIP, ALK, APC, ATM, AXIN2, BAP1, BARD1, BMPR1A, BRCA1, BRCA2, BRIP1, CDC73, CDH1, CDK4, CDKN1B, CDKN2A, CEBPA, CHEK2, CTNNA1, DDX41, DICER1, ETV6, FH, FLCN, GATA2, LZTR1, MAX, MBD4, MEN1, MET, MLH1, MSH2, MSH3, MSH6, MUTYH, NF1, NF2, NTHL1, PALB2, PHOX2B, PMS2, POT1, PRKAR1A, PTCH1, PTEN, RAD51C, RAD51D, RB1, RET, RUNX1, SDHA, SDHAF2, SDHB, SDHC, SDHD, SMAD4, SMARCA4, SMARCB1, SMARCE1, STK11, SUFU, TMEM127, TP53, TSC1, TSC2, VHL, and WT1 (sequencing and deletion/duplication); EGFR, HOXB13, KIT, MITF, PDGFRA, POLD1, and POLE (sequencing only); EPCAM and GREM1 (deletion/duplication only).    Pancreatic cancer (HCC)  11/03/2023 Initial Diagnosis   Pancreatic cancer (HCC)   11/11/2023 -  Chemotherapy   Patient is on Treatment Plan : PANCREAS NALIRIFOX D1, 15 Q28D     11/30/2023 Genetic Testing   Negative genetic testing on the CancerNext-Expanded+RNAinsight panel.  VUS identified in KIT c.2083G>C and PMS2 c.-1C>A.  The report date is 11/30/2023.  The CancerNext-Expanded gene panel offered by [facility] and includes sequencing, rearrangement, and RNA analysis for the following 76 genes: AIP, ALK, APC, ATM, AXIN2, BAP1, BARD1, BMPR1A, BRCA1, BRCA2, BRIP1, CDC73, CDH1, CDK4, CDKN1B, CDKN2A, CEBPA, CHEK2, CTNNA1, DDX41, DICER1, ETV6, FH, FLCN, GATA2, LZTR1, MAX, MBD4, MEN1, MET, MLH1, MSH2, MSH3, MSH6, MUTYH, NF1, NF2, NTHL1, PALB2, PHOX2B, PMS2, POT1, PRKAR1A, PTCH1, PTEN, RAD51C, RAD51D, RB1, RET, RUNX1, SDHA, SDHAF2, SDHB, SDHC, SDHD, SMAD4, SMARCA4, SMARCB1, SMARCE1, STK11, SUFU, TMEM127, TP53, TSC1, TSC2, VHL, and WT1 (sequencing  and deletion/duplication); EGFR, HOXB13, KIT, MITF, PDGFRA, POLD1, and POLE (sequencing only); EPCAM and GREM1 (deletion/duplication only).    01/29/2024 Imaging   CT abdomen and pelvis with contrast  IMPRESSION: 1. Diminished size of a hypodense mass of the pancreatic neck, consistent with treatment response. 2. Diminished size and hypodensity of numerous liver metastases, with new overlying pseudocirrhotic capsular retraction, consistent with treatment response. 3. 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 4. Unchanged prominent, nonspecific retroperitoneal lymph nodes.  5. Underlying hepatic steatosis.      Discussed the use of AI scribe software for clinical note transcription with the patient, who gave verbal consent to proceed.  History of Present Illness Ashley Clark is a 63 year old female with pancreatic cancer who presents for follow-up.  She feels generally unwell but has no new symptoms. Her weight is stable, and she maintains nutrition with high-calorie foods due to altered taste buds. She cannot tolerate spicy foods. Blood counts and liver function tests are normal. Calcium  levels are slightly low. Blood sugar is 127. Tumor marker CA 19-9 has increased from 200-300 to 604. She is taking Eliquis  for anticoagulation and has sufficient nausea medication. She receives weekly PICC line care, with one port not functioning but the other working fine.     All other systems were reviewed with the patient and are negative.  MEDICAL HISTORY:  Past Medical History:  Diagnosis Date   Acute pulmonary embolism (HCC) 10/29/2023   Allergy    Back pain    Cancer (HCC) 2019   left breast cancer-last radiation  Dec.2019   Diabetes mellitus without complication [facility])    Medical history non-contributory    Personal history of radiation therapy    Port-A-Cath in place - removed 12-11-2023 due to port-a-cath infection. 11/13/2023   Prediabetes    Tiredness     Vitamin D  deficiency     SURGICAL HISTORY: Past Surgical History:  Procedure Laterality Date   BREAST BIOPSY Left 08/04/2018   x3   BREAST LUMPECTOMY Left    09-29-18   BREAST LUMPECTOMY WITH RADIOACTIVE SEED AND SENTINEL LYMPH NODE BIOPSY Left 09/29/2018   Procedure: LEFT BREAST LUMPECTOMY WITH RADIOACTIVE SEED AND SENTINEL LYMPH NODE BIOPSY;  Surgeon: [name], MD;  Location: [facility];  Service: General;  Laterality: Left;   COLONOSCOPY  2016   IR IMAGING GUIDED PORT INSERTION  11/03/2023   IR PATIENT EVAL TECH 0-60 MINS  12/18/2023   IR PATIENT EVAL TECH 0-60 MINS  12/24/2023   IR PATIENT EVAL TECH 0-60 MINS  12/28/2023   IR PATIENT EVAL TECH 0-60 MINS  01/04/2024   IR PATIENT EVAL TECH 0-60 MINS  01/13/2024   IR PATIENT EVAL TECH 0-60 MINS  01/22/2024   IR RADIOLOGIST EVAL & MGMT  12/15/2023   IR REMOVAL TUN ACCESS W/ PORT W/O FL MOD SED  12/11/2023   POLYPECTOMY     WISDOM TOOTH EXTRACTION      I have reviewed the social history and family history with the patient and they are unchanged from previous note.  ALLERGIES:  is allergic to irinotecan  liposome.  MEDICATIONS:  Current Outpatient Medications  Medication Sig Dispense Refill   apixaban  (ELIQUIS ) 5 MG TABS tablet Take 1 tablet (5 mg total) by mouth 2 (two) times daily. 60 tablet 5   cetirizine (ZYRTEC) 10 MG chewable tablet Chew 10 mg by mouth daily.     lidocaine -prilocaine  (EMLA ) cream Apply to affected area once (Patient taking differently: Apply 1 Application topically See admin instructions. Apply to affected area for port access) 30 g 3   ondansetron  (ZOFRAN ) 8 MG tablet Take 1 tablet (8 mg total) by mouth every 8 (eight) hours as needed for nausea or vomiting. Start on the third day after irinotecan  30 tablet 1   prochlorperazine  (COMPAZINE ) 10 MG tablet Take 1 tablet (10 mg total) by mouth every 6 (six) hours as needed for nausea or vomiting. 30 tablet 1   No current facility-administered  medications for this visit.   Facility-Administered Medications Ordered in Other Visits  Medication Dose Route Frequency Provider Last Rate Last Admin   heparin  lock flush 100 unit/mL  500 Units Intracatheter Once PRN [name], MD       sodium chloride  flush (NS) 0.9 % injection 10 mL  10 mL Intracatheter PRN [name], MD        PHYSICAL EXAMINATION: ECOG PERFORMANCE STATUS: 1 - Symptomatic but completely ambulatory  Vitals:   09/28/24 0907  BP: 104/64  Pulse: 70  Resp: 16  Temp: 98.1 F (36.7 C)  SpO2: 98%   Wt Readings from Last 3 Encounters:  09/28/24 167 lb 14.4 oz (76.2 kg)  09/13/24 169 lb 6.4 oz (76.8 kg)  08/31/24 168 lb 6.4 oz (76.4 kg)     GENERAL:alert, no distress and comfortable SKIN: skin color, texture, turgor are normal, no rashes or significant lesions EYES: normal, Conjunctiva are pink and non-injected, sclera clear NECK: supple, thyroid normal size, non-tender, without nodularity LYMPH:  no palpable lymphadenopathy in the cervical, axillary  LUNGS: clear to  auscultation and percussion with normal breathing effort HEART: regular rate & rhythm and no murmurs and no lower extremity edema ABDOMEN:abdomen soft, non-tender and normal bowel sounds Musculoskeletal:no cyanosis of digits and no clubbing  NEURO: alert & oriented x 3 with fluent speech, no focal motor/sensory deficits  Physical Exam    LABORATORY DATA:  I have reviewed the data as listed    Latest Ref Rng & Units 09/28/2024    8:25 AM 09/13/2024    8:25 AM 08/31/2024    8:50 AM  CBC  WBC 4.0 - 10.5 K/uL 6.1  5.6  7.1   Hemoglobin 12.0 - 15.0 g/dL 87.4  87.4  87.2   Hematocrit 36.0 - 46.0 % 37.8  38.3  38.8   Platelets 150 - 400 K/uL 210  232  216         Latest Ref Rng & Units 09/28/2024    8:25 AM 09/13/2024    8:25 AM 08/31/2024    8:50 AM  CMP  Glucose 70 - 99 mg/dL 872  895  893   BUN 8 - 23 mg/dL 9  5  7    Creatinine 0.44 - 1.00 mg/dL 9.33  9.49  9.48   Sodium 135 - 145 mmol/L  137  139  139   Potassium 3.5 - 5.1 mmol/L 3.9  3.8  3.8   Chloride 98 - 111 mmol/L 104  106  104   CO2 22 - 32 mmol/L 28  28  28    Calcium  8.9 - 10.3 mg/dL 8.8  9.0  8.8   Total Protein 6.5 - 8.1 g/dL 6.5  6.4  6.6   Total Bilirubin 0.0 - 1.2 mg/dL 0.3  0.4  0.3   Alkaline Phos 38 - 126 U/L 179  206  222   AST 15 - 41 U/L 11  11  13    ALT 0 - 44 U/L 6  8  9        RADIOGRAPHIC STUDIES: I have personally reviewed the radiological images as listed and agreed with the findings in the report. No results found.    No orders of the defined types were placed in this encounter.  All questions were answered. The patient knows to call the clinic with any problems, questions or concerns. No barriers to learning was detected. The total time spent in the appointment was 25 minutes, including review of chart and various tests results, discussions about plan of care and coordination of care plan     Onita Mattock, MD 09/28/2024

## 2024-09-30 ENCOUNTER — Other Ambulatory Visit: Payer: Self-pay

## 2024-09-30 ENCOUNTER — Inpatient Hospital Stay

## 2024-09-30 VITALS — BP 104/77 | HR 77 | Temp 98.7°F | Resp 16

## 2024-09-30 DIAGNOSIS — C251 Malignant neoplasm of body of pancreas: Secondary | ICD-10-CM

## 2024-09-30 DIAGNOSIS — Z5111 Encounter for antineoplastic chemotherapy: Secondary | ICD-10-CM | POA: Diagnosis not present

## 2024-09-30 MED ORDER — PEGFILGRASTIM INJECTION 6 MG/0.6ML ~~LOC~~
6.0000 mg | PREFILLED_SYRINGE | Freq: Once | SUBCUTANEOUS | Status: AC
  Administered 2024-09-30: 6 mg via SUBCUTANEOUS
  Filled 2024-09-30: qty 0.6

## 2024-10-05 ENCOUNTER — Inpatient Hospital Stay

## 2024-10-07 ENCOUNTER — Other Ambulatory Visit: Payer: Self-pay | Admitting: Pharmacist

## 2024-10-07 ENCOUNTER — Encounter: Payer: Self-pay | Admitting: Hematology

## 2024-10-11 NOTE — Assessment & Plan Note (Signed)
-  stage IV with liver mets, MSS, KRAS G12R mutation (+) -diagnosed in 10/2023 -Patient has a history of breast cancer. She presents with dyspnea, chest discomfort, decreased appetite, and weight loss. She also reports new onset back pain. -CT showed a 2.9cm mass in pancreatic neck/body, and multiple liver mets, liver biopsy confirmed adenocarcinoma  -I reviewed the aggressive nature of pancreatic cancer, and the incurable nature of her disease due to diffuse liver metastasis. -she start first line chemo Nalirifox chemotherapy regimen on 11/12, with dose reduction for first cycle due to poor PS -Performed molecular testing on tumor tissue to identify potential targeted therapy options, unfortunately no actionable mutations detected  -She underwent genetic testing and came back negative  -restaging CT 04/20/2024 showed mild improvement, chemo changed to 5-fu and liposomal irinotecan  in April 2025 -CT 07/28/2024 showed overall stable disease

## 2024-10-12 ENCOUNTER — Inpatient Hospital Stay

## 2024-10-12 ENCOUNTER — Inpatient Hospital Stay (HOSPITAL_BASED_OUTPATIENT_CLINIC_OR_DEPARTMENT_OTHER): Admitting: Hematology

## 2024-10-12 VITALS — BP 116/78 | HR 84 | Temp 98.7°F | Resp 16 | Ht 61.5 in | Wt 164.1 lb

## 2024-10-12 DIAGNOSIS — C251 Malignant neoplasm of body of pancreas: Secondary | ICD-10-CM

## 2024-10-12 DIAGNOSIS — Z5111 Encounter for antineoplastic chemotherapy: Secondary | ICD-10-CM | POA: Diagnosis not present

## 2024-10-12 DIAGNOSIS — C259 Malignant neoplasm of pancreas, unspecified: Secondary | ICD-10-CM | POA: Diagnosis not present

## 2024-10-12 LAB — CBC WITH DIFFERENTIAL (CANCER CENTER ONLY)
Abs Immature Granulocytes: 0.08 K/uL — ABNORMAL HIGH (ref 0.00–0.07)
Basophils Absolute: 0 K/uL (ref 0.0–0.1)
Basophils Relative: 0 %
Eosinophils Absolute: 0.1 K/uL (ref 0.0–0.5)
Eosinophils Relative: 1 %
HCT: 37.8 % (ref 36.0–46.0)
Hemoglobin: 12.7 g/dL (ref 12.0–15.0)
Immature Granulocytes: 1 %
Lymphocytes Relative: 16 %
Lymphs Abs: 1.2 K/uL (ref 0.7–4.0)
MCH: 28.5 pg (ref 26.0–34.0)
MCHC: 33.6 g/dL (ref 30.0–36.0)
MCV: 84.8 fL (ref 80.0–100.0)
Monocytes Absolute: 0.6 K/uL (ref 0.1–1.0)
Monocytes Relative: 9 %
Neutro Abs: 5.1 K/uL (ref 1.7–7.7)
Neutrophils Relative %: 73 %
Platelet Count: 216 K/uL (ref 150–400)
RBC: 4.46 MIL/uL (ref 3.87–5.11)
RDW: 14.8 % (ref 11.5–15.5)
WBC Count: 7.1 K/uL (ref 4.0–10.5)
nRBC: 0 % (ref 0.0–0.2)

## 2024-10-12 LAB — CMP (CANCER CENTER ONLY)
ALT: 12 U/L (ref 0–44)
AST: 15 U/L (ref 15–41)
Albumin: 4 g/dL (ref 3.5–5.0)
Alkaline Phosphatase: 206 U/L — ABNORMAL HIGH (ref 38–126)
Anion gap: 6 (ref 5–15)
BUN: 5 mg/dL — ABNORMAL LOW (ref 8–23)
CO2: 28 mmol/L (ref 22–32)
Calcium: 9.4 mg/dL (ref 8.9–10.3)
Chloride: 102 mmol/L (ref 98–111)
Creatinine: 0.57 mg/dL (ref 0.44–1.00)
GFR, Estimated: >60 mL/min (ref >60)
Glucose, Bld: 105 mg/dL — ABNORMAL HIGH (ref 70–99)
Potassium: 3.7 mmol/L (ref 3.5–5.1)
Sodium: 136 mmol/L (ref 135–145)
Total Bilirubin: 0.6 mg/dL (ref 0.0–1.2)
Total Protein: 6.7 g/dL (ref 6.5–8.1)

## 2024-10-12 MED ORDER — DEXAMETHASONE SOD PHOSPHATE PF 10 MG/ML IJ SOLN
10.0000 mg | Freq: Once | INTRAMUSCULAR | Status: AC
  Administered 2024-10-12: 10 mg via INTRAVENOUS

## 2024-10-12 MED ORDER — DEXTROSE 5 % IV SOLN: INTRAVENOUS | Status: DC

## 2024-10-12 MED ORDER — SODIUM CHLORIDE 0.9 % IV SOLN
400.0000 mg/m2 | Freq: Once | INTRAVENOUS | Status: AC
  Administered 2024-10-12: 788 mg via INTRAVENOUS
  Filled 2024-10-12: qty 25

## 2024-10-12 MED ORDER — SODIUM CHLORIDE 0.9 % IV SOLN
4500.0000 mg | INTRAVENOUS | Status: DC
  Administered 2024-10-12: 4500 mg via INTRAVENOUS
  Filled 2024-10-12: qty 90

## 2024-10-12 MED ORDER — PALONOSETRON HCL INJECTION 0.25 MG/5ML
0.2500 mg | Freq: Once | INTRAVENOUS | Status: AC
  Administered 2024-10-12: 0.25 mg via INTRAVENOUS
  Filled 2024-10-12: qty 5

## 2024-10-12 MED ORDER — ATROPINE SULFATE 1 MG/ML IV SOLN
0.4000 mg | Freq: Once | INTRAVENOUS | Status: AC
  Administered 2024-10-12: 0.4 mg via INTRAVENOUS
  Filled 2024-10-12: qty 1

## 2024-10-12 MED ORDER — SODIUM CHLORIDE 0.9 % IV SOLN: Freq: Once | INTRAVENOUS | Status: AC

## 2024-10-12 MED ORDER — SODIUM CHLORIDE 0.9 % IV SOLN
50.0000 mg/m2 | Freq: Once | INTRAVENOUS | Status: AC
  Administered 2024-10-12: 98.9 mg via INTRAVENOUS
  Filled 2024-10-12: qty 23

## 2024-10-12 MED ORDER — DEXAMETHASONE SODIUM PHOSPHATE 10 MG/ML IJ SOLN
10.0000 mg | Freq: Once | INTRAMUSCULAR | Status: DC
  Filled 2024-10-12: qty 1

## 2024-10-12 MED ORDER — FAMOTIDINE IN NACL 20-0.9 MG/50ML-% IV SOLN
20.0000 mg | Freq: Once | INTRAVENOUS | Status: AC
  Administered 2024-10-12: 20 mg via INTRAVENOUS
  Filled 2024-10-12: qty 50

## 2024-10-12 NOTE — Patient Instructions (Signed)
 [facility] - A DEPT OF [facility]  Discharge Instructions: Thank you for choosing [facility] to provide your oncology and hematology care.   If you have a lab appointment with the Cancer Center, please go directly to the Cancer Center and check in at the registration area.   Wear comfortable clothing and clothing appropriate for easy access to any Portacath or PICC line.   We strive to give you quality time with your provider. You may need to reschedule your appointment if you arrive late (15 or more minutes).  Arriving late affects you and other patients whose appointments are after yours.  Also, if you miss three or more appointments without notifying the office, you may be dismissed from the clinic at the provider's discretion.      For prescription refill requests, have your pharmacy contact our office and allow 72 hours for refills to be completed.    Today you received the following chemotherapy and/or immunotherapy agents: Liposomal Irinotecan, Leucovorin, Fluorouracil.       To help prevent nausea and vomiting after your treatment, we encourage you to take your nausea medication as directed.  BELOW ARE SYMPTOMS THAT SHOULD BE REPORTED IMMEDIATELY: *FEVER GREATER THAN 100.4 F (38 C) OR HIGHER *CHILLS OR SWEATING *NAUSEA AND VOMITING THAT IS NOT CONTROLLED WITH YOUR NAUSEA MEDICATION *UNUSUAL SHORTNESS OF BREATH *UNUSUAL BRUISING OR BLEEDING *URINARY PROBLEMS (pain or burning when urinating, or frequent urination) *BOWEL PROBLEMS (unusual diarrhea, constipation, pain near the anus) TENDERNESS IN MOUTH AND THROAT WITH OR WITHOUT PRESENCE OF ULCERS (sore throat, sores in mouth, or a toothache) UNUSUAL RASH, SWELLING OR PAIN  UNUSUAL VAGINAL DISCHARGE OR ITCHING   Items with * indicate a potential emergency and should be followed up as soon as possible or go to the Emergency Department if any problems should occur.  Please show the  CHEMOTHERAPY ALERT CARD or IMMUNOTHERAPY ALERT CARD at check-in to the Emergency Department and triage nurse.  Should you have questions after your visit or need to cancel or reschedule your appointment, please contact [facility] - A DEPT [facility]  Dept: [phone]  and follow the prompts.  Office hours are 8:00 a.m. to 4:30 p.m. Monday - Friday. Please note that voicemails left after 4:00 p.m. may not be returned until the following business day.  We are closed weekends and major holidays. You have access to a nurse at all times for urgent questions. Please call the main number to the clinic Dept: [phone] and follow the prompts.   For any non-urgent questions, you may also contact your provider using MyChart. We now offer e-Visits for anyone 74 and older to request care online for non-urgent symptoms. For details visit [email or web address].   Also download the MyChart app! Go to the app store, search "MyChart", open the app, select [facility], and log in with your MyChart username and password.

## 2024-10-12 NOTE — Progress Notes (Signed)
 [facility]   Telephone:([phone]) [phone] Fax:([phone]) [phone]   Clinic Follow up Note   Patient Care Team: [name], [name] as PCP - General (Nurse Practitioner) [name], MD as Consulting Physician (Hematology and Oncology) [name], MD as Consulting Physician (Radiation Oncology) [name], MD as Consulting Physician (General Surgery) [name], MD as Consulting Physician (Hematology and Oncology)  Date of Service:  10/12/2024  CHIEF COMPLAINT: f/u of pancreatic cancer  CURRENT THERAPY:  Chemotherapy 5-FU and liposomal irinotecan  every 2 weeks  Oncology History   Pancreatic cancer (HCC) -stage IV with liver mets, MSS, KRAS G12R mutation (+) -diagnosed in 10/2023 -Patient has a history of breast cancer. She presents with dyspnea, chest discomfort, decreased appetite, and weight loss. She also reports new onset back pain. -CT showed a 2.9cm mass in pancreatic neck/body, and multiple liver mets, liver biopsy confirmed adenocarcinoma  -I reviewed the aggressive nature of pancreatic cancer, and the incurable nature of her disease due to diffuse liver metastasis. -she start first line chemo Nalirifox chemotherapy regimen on 11/12, with dose reduction for first cycle due to poor PS -Performed molecular testing on tumor tissue to identify potential targeted therapy options, unfortunately no actionable mutations detected  -She underwent genetic testing and came back negative  -restaging CT 04/20/2024 showed mild improvement, chemo changed to 5-fu and liposomal irinotecan  in April 2025 -CT 07/28/2024 showed overall stable disease  Assessment & Plan Pancreatic cancer Currently on cycle thirteen of chemotherapy for pancreatic cancer. Blood counts, renal, and hepatic functions remain normal, indicating chemotherapy tolerance. Tumor markers previously elevated at 600; repeat test pending. Experienced a weight loss of five pounds over the past month and occasional  diarrhea, likely treatment-related. Reports neuropathy symptoms without worsening. Plan to continue current chemotherapy regimen and evaluate treatment efficacy based on upcoming CT scan results. - Repeat tumor markers today - Proceed with chemotherapy cycle thirteen - Encourage high-calorie, high-protein diet - Monitor weight and nutritional intake - Schedule CT scan for October 19, 2024 - Review CT scan results and adjust treatment plan as necessary - Ensure pump removal is scheduled for November 14 and December 02, 2024  Plan - Lab reviewed, adequate for treatment, will proceed to chemo today continue every 2 weeks - Scheduled for restaging CT scan next week - Follow-up in 2 weeks     SUMMARY OF ONCOLOGIC HISTORY: Oncology History  Malignant neoplasm of overlapping sites of left breast in female, estrogen receptor positive (HCC)  08/04/2018 Initial Diagnosis   Screening mammogram detected left breast mass at 9:30 position 1.2 x 1.2 x 1.1 cm.  Closer to the nipple at 9:30 position 0.8 cm lesion span 3.6 cm and a 1.5 cm apart.  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 position 2 cm from nipple, at 1 cm from nipple PASH, lymph node benign, ER 90%, PR 95%, Ki-67 20%, HER-2 negative by IHC 1+, T1CN0 stage Ia AJCC 8   09/29/2018 Surgery   Left lumpectomy: Grade 3 IDC, 1.7 cm, margins negative, lymphovascular invasion present, 0/2 lymph nodes negative, T1c N0 stage Ia   10/06/2018 Cancer Staging   Staging form: Breast, AJCC 8th Edition - Pathologic: Stage IA (pT1c, pN0(sn), cM0, G3, ER+, PR+, HER2-) - Signed by [name], MD on 10/06/2018   10/22/2018 Oncotype testing   Oncotype score 13: Distant recurrence at 9 years with hormone therapy alone 4%   11/12/2018 - 12/27/2018  Radiation Therapy   Adjuvant radiation therapy   12/27/2018 -  Anti-estrogen oral therapy   Antiestrogen therapy with letrozole  2.5 mg  daily   11/30/2023 Genetic Testing   Negative genetic testing on the CancerNext-Expanded+RNAinsight panel.  VUS identified in KIT c.2083G>C and PMS2 c.-1C>A.  The report date is 11/30/2023.  The CancerNext-Expanded gene panel offered by [facility] and includes sequencing, rearrangement, and RNA analysis for the following 76 genes: AIP, ALK, APC, ATM, AXIN2, BAP1, BARD1, BMPR1A, BRCA1, BRCA2, BRIP1, CDC73, CDH1, CDK4, CDKN1B, CDKN2A, CEBPA, CHEK2, CTNNA1, DDX41, DICER1, ETV6, FH, FLCN, GATA2, LZTR1, MAX, MBD4, MEN1, MET, MLH1, MSH2, MSH3, MSH6, MUTYH, NF1, NF2, NTHL1, PALB2, PHOX2B, PMS2, POT1, PRKAR1A, PTCH1, PTEN, RAD51C, RAD51D, RB1, RET, RUNX1, SDHA, SDHAF2, SDHB, SDHC, SDHD, SMAD4, SMARCA4, SMARCB1, SMARCE1, STK11, SUFU, TMEM127, TP53, TSC1, TSC2, VHL, and WT1 (sequencing and deletion/duplication); EGFR, HOXB13, KIT, MITF, PDGFRA, POLD1, and POLE (sequencing only); EPCAM and GREM1 (deletion/duplication only).    Pancreatic cancer (HCC)  11/03/2023 Initial Diagnosis   Pancreatic cancer (HCC)   11/11/2023 -  Chemotherapy   Patient is on Treatment Plan : PANCREAS NALIRIFOX D1, 15 Q28D     11/30/2023 Genetic Testing   Negative genetic testing on the CancerNext-Expanded+RNAinsight panel.  VUS identified in KIT c.2083G>C and PMS2 c.-1C>A.  The report date is 11/30/2023.  The CancerNext-Expanded gene panel offered by [facility] and includes sequencing, rearrangement, and RNA analysis for the following 76 genes: AIP, ALK, APC, ATM, AXIN2, BAP1, BARD1, BMPR1A, BRCA1, BRCA2, BRIP1, CDC73, CDH1, CDK4, CDKN1B, CDKN2A, CEBPA, CHEK2, CTNNA1, DDX41, DICER1, ETV6, FH, FLCN, GATA2, LZTR1, MAX, MBD4, MEN1, MET, MLH1, MSH2, MSH3, MSH6, MUTYH, NF1, NF2, NTHL1, PALB2, PHOX2B, PMS2, POT1, PRKAR1A, PTCH1, PTEN, RAD51C, RAD51D, RB1, RET, RUNX1, SDHA, SDHAF2, SDHB, SDHC, SDHD, SMAD4, SMARCA4, SMARCB1, SMARCE1, STK11, SUFU, TMEM127, TP53, TSC1, TSC2, VHL, and WT1 (sequencing and deletion/duplication); EGFR, HOXB13,  KIT, MITF, PDGFRA, POLD1, and POLE (sequencing only); EPCAM and GREM1 (deletion/duplication only).    01/29/2024 Imaging   CT abdomen and pelvis with contrast  IMPRESSION: 1. Diminished size of a hypodense mass of the pancreatic neck, consistent with treatment response. 2. Diminished size and hypodensity of numerous liver metastases, with new overlying pseudocirrhotic capsular retraction, consistent with treatment response. 3. 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 4. Unchanged prominent, nonspecific retroperitoneal lymph nodes.  5. Underlying hepatic steatosis.      Discussed the use of AI scribe software for clinical note transcription with the patient, who gave verbal consent to proceed.  History of Present Illness Ashley Clark is a 63 year old female with pancreatic cancer who presents for follow-up.  She is on cycle thirteen of chemotherapy. Tumor markers were previously elevated at 600, with a repeat scan scheduled next Wednesday. She experiences variable appetite and has lost five pounds over the past month. Occasional diarrhea occurs, with a recent episode on Sunday. Persistent numbness, tingling, and neuropathy are present but stable. She uses Eliquis  and has nausea medication available.     All other systems were reviewed with the patient and are negative.  MEDICAL HISTORY:  Past Medical History:  Diagnosis Date   Acute pulmonary embolism (HCC) 10/29/2023   Allergy    Back pain    Cancer (HCC) 2019   left breast cancer-last radiation Dec.2019   Diabetes mellitus without complication [facility])    Medical history non-contributory    Personal history of radiation therapy    Port-A-Cath in place - removed 12-11-2023 due to port-a-cath infection. 11/13/2023  Prediabetes    Tiredness    Vitamin D  deficiency     SURGICAL HISTORY: Past Surgical History:  Procedure Laterality Date   BREAST BIOPSY Left 08/04/2018   x3   BREAST LUMPECTOMY Left     09-29-18   BREAST LUMPECTOMY WITH RADIOACTIVE SEED AND SENTINEL LYMPH NODE BIOPSY Left 09/29/2018   Procedure: LEFT BREAST LUMPECTOMY WITH RADIOACTIVE SEED AND SENTINEL LYMPH NODE BIOPSY;  Surgeon: [name], MD;  Location: [facility];  Service: General;  Laterality: Left;   COLONOSCOPY  2016   IR IMAGING GUIDED PORT INSERTION  11/03/2023   IR PATIENT EVAL TECH 0-60 MINS  12/18/2023   IR PATIENT EVAL TECH 0-60 MINS  12/24/2023   IR PATIENT EVAL TECH 0-60 MINS  12/28/2023   IR PATIENT EVAL TECH 0-60 MINS  01/04/2024   IR PATIENT EVAL TECH 0-60 MINS  01/13/2024   IR PATIENT EVAL TECH 0-60 MINS  01/22/2024   IR RADIOLOGIST EVAL & MGMT  12/15/2023   IR REMOVAL TUN ACCESS W/ PORT W/O FL MOD SED  12/11/2023   POLYPECTOMY     WISDOM TOOTH EXTRACTION      I have reviewed the social history and family history with the patient and they are unchanged from previous note.  ALLERGIES:  is allergic to irinotecan  liposome.  MEDICATIONS:  Current Outpatient Medications  Medication Sig Dispense Refill   apixaban  (ELIQUIS ) 5 MG TABS tablet Take 1 tablet (5 mg total) by mouth 2 (two) times daily. 60 tablet 5   cetirizine (ZYRTEC) 10 MG chewable tablet Chew 10 mg by mouth daily.     lidocaine -prilocaine  (EMLA ) cream Apply to affected area once (Patient taking differently: Apply 1 Application topically See admin instructions. Apply to affected area for port access) 30 g 3   ondansetron  (ZOFRAN ) 8 MG tablet Take 1 tablet (8 mg total) by mouth every 8 (eight) hours as needed for nausea or vomiting. Start on the third day after irinotecan  30 tablet 1   prochlorperazine  (COMPAZINE ) 10 MG tablet Take 1 tablet (10 mg total) by mouth every 6 (six) hours as needed for nausea or vomiting. 30 tablet 1   No current facility-administered medications for this visit.   Facility-Administered Medications Ordered in Other Visits  Medication Dose Route Frequency Provider Last Rate Last Admin   heparin   lock flush 100 unit/mL  500 Units Intracatheter Once PRN [name], MD       sodium chloride  flush (NS) 0.9 % injection 10 mL  10 mL Intracatheter PRN [name], MD        PHYSICAL EXAMINATION: ECOG PERFORMANCE STATUS: 1 - Symptomatic but completely ambulatory  Vitals:   10/12/24 0855  BP: 116/78  Pulse: 84  Resp: 16  Temp: 98.7 F (37.1 C)  SpO2: 99%   Wt Readings from Last 3 Encounters:  10/12/24 164 lb 1.6 oz (74.4 kg)  09/28/24 167 lb 14.4 oz (76.2 kg)  09/13/24 169 lb 6.4 oz (76.8 kg)    GENERAL:alert, no distress and comfortable SKIN: skin color, texture, turgor are normal, no rashes or significant lesions EYES: normal, Conjunctiva are pink and non-injected, sclera clear NECK: supple, thyroid normal size, non-tender, without nodularity LYMPH:  no palpable lymphadenopathy in the cervical, axillary  LUNGS: clear to auscultation and percussion with normal breathing effort HEART: regular rate & rhythm and no murmurs and no lower extremity edema ABDOMEN:abdomen soft, non-tender and normal bowel sounds Musculoskeletal:no cyanosis of digits and no clubbing  NEURO: alert & oriented  x 3 with fluent speech, no focal motor/sensory deficits  Physical Exam    LABORATORY DATA:  I have reviewed the data as listed    Latest Ref Rng & Units 10/12/2024    8:09 AM 09/28/2024    8:25 AM 09/13/2024    8:25 AM  CBC  WBC 4.0 - 10.5 K/uL 7.1  6.1  5.6   Hemoglobin 12.0 - 15.0 g/dL 87.2  87.4  87.4   Hematocrit 36.0 - 46.0 % 37.8  37.8  38.3   Platelets 150 - 400 K/uL 216  210  232         Latest Ref Rng & Units 10/12/2024    8:09 AM 09/28/2024    8:25 AM 09/13/2024    8:25 AM  CMP  Glucose 70 - 99 mg/dL 894  872  895   BUN 8 - 23 mg/dL 5  9  5    Creatinine 0.44 - 1.00 mg/dL 9.42  9.33  9.49   Sodium 135 - 145 mmol/L 136  137  139   Potassium 3.5 - 5.1 mmol/L 3.7  3.9  3.8   Chloride 98 - 111 mmol/L 102  104  106   CO2 22 - 32 mmol/L 28  28  28    Calcium  8.9 - 10.3 mg/dL 9.4   8.8  9.0   Total Protein 6.5 - 8.1 g/dL 6.7  6.5  6.4   Total Bilirubin 0.0 - 1.2 mg/dL 0.6  0.3  0.4   Alkaline Phos 38 - 126 U/L 206  179  206   AST 15 - 41 U/L 15  11  11    ALT 0 - 44 U/L 12  6  8        RADIOGRAPHIC STUDIES: I have personally reviewed the radiological images as listed and agreed with the findings in the report. No results found.    No orders of the defined types were placed in this encounter.  All questions were answered. The patient knows to call the clinic with any problems, questions or concerns. No barriers to learning was detected. The total time spent in the appointment was 25 minutes, including review of chart and various tests results, discussions about plan of care and coordination of care plan     Onita Mattock, MD 10/12/2024

## 2024-10-13 ENCOUNTER — Telehealth: Payer: Self-pay | Admitting: Hematology

## 2024-10-13 LAB — CANCER ANTIGEN 19-9: CA 19-9: 1502 U/mL — ABNORMAL HIGH (ref 0–35)

## 2024-10-13 NOTE — Telephone Encounter (Signed)
 I contacted Ashley Clark and informed her of her Pump appts added for 11/14 and 12/5. She asked to be scheduled at 12pm.

## 2024-10-14 ENCOUNTER — Other Ambulatory Visit (HOSPITAL_COMMUNITY): Payer: Self-pay

## 2024-10-14 ENCOUNTER — Inpatient Hospital Stay

## 2024-10-14 VITALS — BP 105/71 | HR 73 | Temp 98.7°F | Resp 16

## 2024-10-14 DIAGNOSIS — Z5111 Encounter for antineoplastic chemotherapy: Secondary | ICD-10-CM | POA: Diagnosis not present

## 2024-10-14 DIAGNOSIS — C251 Malignant neoplasm of body of pancreas: Secondary | ICD-10-CM

## 2024-10-14 MED ORDER — PEGFILGRASTIM INJECTION 6 MG/0.6ML ~~LOC~~
6.0000 mg | PREFILLED_SYRINGE | Freq: Once | SUBCUTANEOUS | Status: AC
  Administered 2024-10-14: 6 mg via SUBCUTANEOUS
  Filled 2024-10-14: qty 0.6

## 2024-10-19 ENCOUNTER — Inpatient Hospital Stay

## 2024-10-19 ENCOUNTER — Ambulatory Visit (HOSPITAL_COMMUNITY)
Admission: RE | Admit: 2024-10-19 | Discharge: 2024-10-19 | Disposition: A | Discharge status: Home / Self Care | Source: Ambulatory Visit | Attending: Nurse Practitioner | Admitting: Nurse Practitioner

## 2024-10-19 DIAGNOSIS — Z5111 Encounter for antineoplastic chemotherapy: Secondary | ICD-10-CM | POA: Diagnosis not present

## 2024-10-19 DIAGNOSIS — C787 Secondary malignant neoplasm of liver and intrahepatic bile duct: Secondary | ICD-10-CM | POA: Diagnosis not present

## 2024-10-19 DIAGNOSIS — K8689 Other specified diseases of pancreas: Secondary | ICD-10-CM | POA: Diagnosis not present

## 2024-10-19 DIAGNOSIS — N3289 Other specified disorders of bladder: Secondary | ICD-10-CM | POA: Diagnosis not present

## 2024-10-19 DIAGNOSIS — C251 Malignant neoplasm of body of pancreas: Secondary | ICD-10-CM | POA: Diagnosis not present

## 2024-10-19 MED ORDER — IOHEXOL 300 MG/ML  SOLN
100.0000 mL | Freq: Once | INTRAMUSCULAR | Status: AC | PRN
  Administered 2024-10-19: 100 mL via INTRAVENOUS

## 2024-10-19 MED ORDER — SODIUM CHLORIDE (PF) 0.9 % IJ SOLN
INTRAMUSCULAR | Status: AC
  Filled 2024-10-19: qty 50

## 2024-10-25 ENCOUNTER — Telehealth: Payer: Self-pay

## 2024-10-25 NOTE — Telephone Encounter (Signed)
 LVM for pt regarding her forms being completed, faxed, and confirmation received.No questions or concerns to be noted.

## 2024-10-25 NOTE — Assessment & Plan Note (Signed)
-  stage IV with liver mets, MSS, KRAS G12R mutation (+) -diagnosed in 10/2023 -Patient has a history of breast cancer. She presents with dyspnea, chest discomfort, decreased appetite, and weight loss. She also reports new onset back pain. -CT showed a 2.9cm mass in pancreatic neck/body, and multiple liver mets, liver biopsy confirmed adenocarcinoma  -I reviewed the aggressive nature of pancreatic cancer, and the incurable nature of her disease due to diffuse liver metastasis. -she start first line chemo Nalirifox chemotherapy regimen on 11/12, with dose reduction for first cycle due to poor PS -Performed molecular testing on tumor tissue to identify potential targeted therapy options, unfortunately no actionable mutations detected  -She underwent genetic testing and came back negative  -restaging CT 04/20/2024 showed mild improvement, chemo changed to 5-fu and liposomal irinotecan  in April 2025 -CT 07/28/2024 showed overall stable disease, but unfortunately her cancer progressed in liver on CT 10/19/2024 -plan to change her tx to gemcitabine and abraxane

## 2024-10-26 ENCOUNTER — Inpatient Hospital Stay (HOSPITAL_BASED_OUTPATIENT_CLINIC_OR_DEPARTMENT_OTHER): Admitting: Hematology

## 2024-10-26 ENCOUNTER — Inpatient Hospital Stay: Admitting: Dietician

## 2024-10-26 ENCOUNTER — Other Ambulatory Visit: Payer: Self-pay

## 2024-10-26 ENCOUNTER — Other Ambulatory Visit (HOSPITAL_COMMUNITY): Payer: Self-pay

## 2024-10-26 ENCOUNTER — Inpatient Hospital Stay

## 2024-10-26 ENCOUNTER — Encounter: Payer: Self-pay | Admitting: Hematology

## 2024-10-26 VITALS — BP 116/70 | HR 82 | Temp 99.5°F | Resp 17 | Ht 61.5 in | Wt 165.8 lb

## 2024-10-26 DIAGNOSIS — C251 Malignant neoplasm of body of pancreas: Secondary | ICD-10-CM | POA: Diagnosis not present

## 2024-10-26 DIAGNOSIS — Z5111 Encounter for antineoplastic chemotherapy: Secondary | ICD-10-CM | POA: Diagnosis not present

## 2024-10-26 LAB — CBC WITH DIFFERENTIAL (CANCER CENTER ONLY)
Abs Immature Granulocytes: 0.07 K/uL (ref 0.00–0.07)
Basophils Absolute: 0 K/uL (ref 0.0–0.1)
Basophils Relative: 0 %
Eosinophils Absolute: 0.1 K/uL (ref 0.0–0.5)
Eosinophils Relative: 1 %
HCT: 37.9 % (ref 36.0–46.0)
Hemoglobin: 12.6 g/dL (ref 12.0–15.0)
Immature Granulocytes: 1 %
Lymphocytes Relative: 13 %
Lymphs Abs: 1 K/uL (ref 0.7–4.0)
MCH: 28.6 pg (ref 26.0–34.0)
MCHC: 33.2 g/dL (ref 30.0–36.0)
MCV: 85.9 fL (ref 80.0–100.0)
Monocytes Absolute: 0.7 K/uL (ref 0.1–1.0)
Monocytes Relative: 10 %
Neutro Abs: 5.3 K/uL (ref 1.7–7.7)
Neutrophils Relative %: 75 %
Platelet Count: 200 K/uL (ref 150–400)
RBC: 4.41 MIL/uL (ref 3.87–5.11)
RDW: 15.6 % — ABNORMAL HIGH (ref 11.5–15.5)
WBC Count: 7.2 K/uL (ref 4.0–10.5)
nRBC: 0 % (ref 0.0–0.2)

## 2024-10-26 LAB — CMP (CANCER CENTER ONLY)
ALT: 8 U/L (ref 0–44)
AST: 14 U/L — ABNORMAL LOW (ref 15–41)
Albumin: 4 g/dL (ref 3.5–5.0)
Alkaline Phosphatase: 186 U/L — ABNORMAL HIGH (ref 38–126)
Anion gap: 6 (ref 5–15)
BUN: <5 mg/dL — ABNORMAL LOW (ref 8–23)
CO2: 29 mmol/L (ref 22–32)
Calcium: 9.2 mg/dL (ref 8.9–10.3)
Chloride: 103 mmol/L (ref 98–111)
Creatinine: 0.62 mg/dL (ref 0.44–1.00)
GFR, Estimated: >60 mL/min (ref >60)
Glucose, Bld: 101 mg/dL — ABNORMAL HIGH (ref 70–99)
Potassium: 4 mmol/L (ref 3.5–5.1)
Sodium: 138 mmol/L (ref 135–145)
Total Bilirubin: 0.4 mg/dL (ref 0.0–1.2)
Total Protein: 6.9 g/dL (ref 6.5–8.1)

## 2024-10-26 MED ORDER — PROCHLORPERAZINE MALEATE 10 MG PO TABS
10.0000 mg | ORAL_TABLET | Freq: Four times a day (QID) | ORAL | 1 refills | Status: DC | PRN
  Filled 2024-10-26: qty 30, 8d supply, fill #0

## 2024-10-26 MED ORDER — ONDANSETRON HCL 8 MG PO TABS
8.0000 mg | ORAL_TABLET | Freq: Three times a day (TID) | ORAL | 1 refills | Status: DC | PRN
  Filled 2024-10-26 – 2024-11-07 (×2): qty 30, 10d supply, fill #0

## 2024-10-26 NOTE — Progress Notes (Signed)
 [facility]   [phone] Fax:([phone]   Clinic Follow up Note   Patient Care Team: [name], PA as PCP - General (Nurse Practitioner) [name], MD as Consulting Physician (Hematology and Oncology) [name], MD as Consulting Physician (Radiation Oncology) [name], MD as Consulting Physician (General Surgery) [name], MD as Consulting Physician (Hematology and Oncology)  Date of Service:  10/26/2024  CHIEF COMPLAINT: f/u of pancreatic cancer  CURRENT THERAPY:  5-FU and liposomal irinotecan   Oncology History   Pancreatic cancer (HCC) -stage IV with liver mets, MSS, KRAS G12R mutation (+) -diagnosed in 10/2023 -Patient has a history of breast cancer. She presents with dyspnea, chest discomfort, decreased appetite, and weight loss. She also reports new onset back pain. -CT showed a 2.9cm mass in pancreatic neck/body, and multiple liver mets, liver biopsy confirmed adenocarcinoma  -I reviewed the aggressive nature of pancreatic cancer, and the incurable nature of her disease due to diffuse liver metastasis. -she start first line chemo Nalirifox chemotherapy regimen on 11/12, with dose reduction for first cycle due to poor PS -Performed molecular testing on tumor tissue to identify potential targeted therapy options, unfortunately no actionable mutations detected  -She underwent genetic testing and came back negative  -restaging CT 04/20/2024 showed mild improvement, chemo changed to 5-fu and liposomal irinotecan  in April 2025 -CT 07/28/2024 showed overall stable disease, but unfortunately her cancer progressed in liver on CT 10/19/2024 -plan to change her tx to gemcitabine and abraxane   Assessment & Plan Pancreatic cancer with liver metastasis Progression of pancreatic cancer with significant increase in size of liver metastases on recent CT scan. Current chemotherapy regimen is ineffective, with rising tumor marker levels  indicating disease progression.  - Initiate second-line chemotherapy with gemcitabine and Abraxane, pending insurance preauthorization. - Schedule chemotherapy infusions for two weeks on, one week off, with adjustments for holidays and patient availability. - Use ice during chemotherapy to mitigate neuropathy. - Monitor tumor markers and perform a follow-up scan after a couple of cycles to assess response to treatment.  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 Neuropathy is a known side effect of Abraxane, part of the new chemotherapy regimen. - Administer chemotherapy every other week to reduce the risk of exacerbating neuropathy. - Use ice during chemotherapy to help manage neuropathy symptoms.  Plan - I personally reviewed her restaging CT scan, which unfortunately showed cancer progression in liver - Will change her treatment to gemcitabine and Abraxane every 2 weeks, due to her neuropathy - Plan to start next week.  I will see her back with cycle 2   SUMMARY OF ONCOLOGIC HISTORY: Oncology History  Malignant neoplasm of overlapping sites of left breast in female, estrogen receptor positive (HCC)  08/04/2018 Initial Diagnosis   Screening mammogram detected left breast mass at 9:30 position 1.2 x 1.2 x 1.1 cm.  Closer to the nipple at 9:30 position 0.8 cm lesion span 3.6 cm and a 1.5 cm apart.  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 position 2 cm from nipple, at 1 cm from nipple PASH, lymph node benign, ER 90%, PR 95%, Ki-67 20%, HER-2 negative by IHC 1+, T1CN0 stage Ia AJCC 8   09/29/2018 Surgery   Left lumpectomy: Grade 3 IDC, 1.7 cm, margins negative, lymphovascular invasion present, 0/2 lymph nodes negative, T1c N0 stage Ia   10/06/2018 Cancer Staging  Staging form: Breast, AJCC 8th Edition - Pathologic: Stage IA (pT1c, pN0(sn),  cM0, G3, ER+, PR+, HER2-) - Signed by [name], MD on 10/06/2018   10/22/2018 Oncotype testing   Oncotype score 13: Distant recurrence at 9 years with hormone therapy alone 4%   11/12/2018 - 12/27/2018 Radiation Therapy   Adjuvant radiation therapy   12/27/2018 -  Anti-estrogen oral therapy   Antiestrogen therapy with letrozole  2.5 mg daily   11/30/2023 Genetic Testing   Negative genetic testing on the CancerNext-Expanded+RNAinsight panel.  VUS identified in KIT c.2083G>C and PMS2 c.-1C>A.  The report date is 11/30/2023.  The CancerNext-Expanded gene panel offered by [facility] and includes sequencing, rearrangement, and RNA analysis for the following 76 genes: AIP, ALK, APC, ATM, AXIN2, BAP1, BARD1, BMPR1A, BRCA1, BRCA2, BRIP1, CDC73, CDH1, CDK4, CDKN1B, CDKN2A, CEBPA, CHEK2, CTNNA1, DDX41, DICER1, ETV6, FH, FLCN, GATA2, LZTR1, MAX, MBD4, MEN1, MET, MLH1, MSH2, MSH3, MSH6, MUTYH, NF1, NF2, NTHL1, PALB2, PHOX2B, PMS2, POT1, PRKAR1A, PTCH1, PTEN, RAD51C, RAD51D, RB1, RET, RUNX1, SDHA, SDHAF2, SDHB, SDHC, SDHD, SMAD4, SMARCA4, SMARCB1, SMARCE1, STK11, SUFU, TMEM127, TP53, TSC1, TSC2, VHL, and WT1 (sequencing and deletion/duplication); EGFR, HOXB13, KIT, MITF, PDGFRA, POLD1, and POLE (sequencing only); EPCAM and GREM1 (deletion/duplication only).    Pancreatic cancer (HCC)  11/03/2023 Initial Diagnosis   Pancreatic cancer (HCC)   11/11/2023 - 10/14/2024 Chemotherapy   Patient is on Treatment Plan : PANCREAS NALIRIFOX D1, 15 Q28D     11/30/2023 Genetic Testing   Negative genetic testing on the CancerNext-Expanded+RNAinsight panel.  VUS identified in KIT c.2083G>C and PMS2 c.-1C>A.  The report date is 11/30/2023.  The CancerNext-Expanded gene panel offered by [facility] and includes sequencing, rearrangement, and RNA analysis for the following 76 genes: AIP, ALK, APC, ATM, AXIN2, BAP1, BARD1, BMPR1A, BRCA1, BRCA2, BRIP1, CDC73, CDH1, CDK4, CDKN1B, CDKN2A, CEBPA, CHEK2, CTNNA1, DDX41,  DICER1, ETV6, FH, FLCN, GATA2, LZTR1, MAX, MBD4, MEN1, MET, MLH1, MSH2, MSH3, MSH6, MUTYH, NF1, NF2, NTHL1, PALB2, PHOX2B, PMS2, POT1, PRKAR1A, PTCH1, PTEN, RAD51C, RAD51D, RB1, RET, RUNX1, SDHA, SDHAF2, SDHB, SDHC, SDHD, SMAD4, SMARCA4, SMARCB1, SMARCE1, STK11, SUFU, TMEM127, TP53, TSC1, TSC2, VHL, and WT1 (sequencing and deletion/duplication); EGFR, HOXB13, KIT, MITF, PDGFRA, POLD1, and POLE (sequencing only); EPCAM and GREM1 (deletion/duplication only).    01/29/2024 Imaging   CT abdomen and pelvis with contrast  IMPRESSION: 1. Diminished size of a hypodense mass of the pancreatic neck, consistent with treatment response. 2. Diminished size and hypodensity of numerous liver metastases, with new overlying pseudocirrhotic capsular retraction, consistent with treatment response. 3. 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 4. Unchanged prominent, nonspecific retroperitoneal lymph nodes.  5. Underlying hepatic steatosis.   11/02/2024 -  Chemotherapy   Patient is on Treatment Plan : PANCREATIC Abraxane D1,8,15 + Gemcitabine D1,8,15 q28d        Discussed the use of AI scribe software for clinical note transcription with the patient, who gave verbal consent to proceed.  History of Present Illness Ashley Clark is a 63 year old female with pancreatic cancer who presents for follow-up.  Recent CT scan shows changes in liver lesions compared to three months ago. She has been on her current chemotherapy regimen for several months, initially including a pump.  Neuropathy affects her feet, impacting balance, with less involvement of her hands. She has been on chemotherapy for a significant period, contributing to these symptoms.  She is not taking any new medications and does not require refills. Her PICC line is functioning well.  All other systems were reviewed with the patient and are negative.  MEDICAL HISTORY:  Past Medical History:  Diagnosis Date   Acute  pulmonary embolism (HCC) 10/29/2023   Allergy    Back pain    Cancer (HCC) 2019   left breast cancer-last radiation Dec.2019   Diabetes mellitus without complication [facility])    Medical history non-contributory    Personal history of radiation therapy    Port-A-Cath in place - removed 12-11-2023 due to port-a-cath infection. 11/13/2023   Prediabetes    Tiredness    Vitamin D  deficiency     SURGICAL HISTORY: Past Surgical History:  Procedure Laterality Date   BREAST BIOPSY Left 08/04/2018   x3   BREAST LUMPECTOMY Left    09-29-18   BREAST LUMPECTOMY WITH RADIOACTIVE SEED AND SENTINEL LYMPH NODE BIOPSY Left 09/29/2018   Procedure: LEFT BREAST LUMPECTOMY WITH RADIOACTIVE SEED AND SENTINEL LYMPH NODE BIOPSY;  Surgeon: [name], MD;  Location: [facility];  Service: General;  Laterality: Left;   COLONOSCOPY  2016   IR IMAGING GUIDED PORT INSERTION  11/03/2023   IR PATIENT EVAL TECH 0-60 MINS  12/18/2023   IR PATIENT EVAL TECH 0-60 MINS  12/24/2023   IR PATIENT EVAL TECH 0-60 MINS  12/28/2023   IR PATIENT EVAL TECH 0-60 MINS  01/04/2024   IR PATIENT EVAL TECH 0-60 MINS  01/13/2024   IR PATIENT EVAL TECH 0-60 MINS  01/22/2024   IR RADIOLOGIST EVAL & MGMT  12/15/2023   IR REMOVAL TUN ACCESS W/ PORT W/O FL MOD SED  12/11/2023   POLYPECTOMY     WISDOM TOOTH EXTRACTION      I have reviewed the social history and family history with the patient and they are unchanged from previous note.  ALLERGIES:  is allergic to irinotecan  liposome.  MEDICATIONS:  Current Outpatient Medications  Medication Sig Dispense Refill   apixaban  (ELIQUIS ) 5 MG TABS tablet Take 1 tablet (5 mg total) by mouth 2 (two) times daily. 60 tablet 5   cetirizine (ZYRTEC) 10 MG chewable tablet Chew 10 mg by mouth daily.     No current facility-administered medications for this visit.    PHYSICAL EXAMINATION: ECOG PERFORMANCE STATUS: 1 - Symptomatic but completely ambulatory  Vitals:   10/26/24 0850   BP: 116/70  Pulse: 82  Resp: 17  Temp: 99.5 F (37.5 C)  SpO2: 95%   Wt Readings from Last 3 Encounters:  10/26/24 165 lb 12.8 oz (75.2 kg)  10/12/24 164 lb 1.6 oz (74.4 kg)  09/28/24 167 lb 14.4 oz (76.2 kg)     GENERAL:alert, no distress and comfortable SKIN: skin color, texture, turgor are normal, no rashes or significant lesions EYES: normal, Conjunctiva are pink and non-injected, sclera clear NECK: supple, thyroid normal size, non-tender, without nodularity LYMPH:  no palpable lymphadenopathy in the cervical, axillary  LUNGS: clear to auscultation and percussion with normal breathing effort HEART: regular rate & rhythm and no murmurs and no lower extremity edema ABDOMEN:abdomen soft, non-tender and normal bowel sounds Musculoskeletal:no cyanosis of digits and no clubbing  NEURO: alert & oriented x 3 with fluent speech, no focal motor/sensory deficits  Physical Exam    LABORATORY DATA:  I have reviewed the data as listed    Latest Ref Rng & Units 10/26/2024    8:09 AM 10/12/2024    8:09 AM 09/28/2024    8:25 AM  CBC  WBC 4.0 - 10.5 K/uL 7.2  7.1  6.1   Hemoglobin  12.0 - 15.0 g/dL 87.3  87.2  87.4   Hematocrit 36.0 - 46.0 % 37.9  37.8  37.8   Platelets 150 - 400 K/uL 200  216  210         Latest Ref Rng & Units 10/12/2024    8:09 AM 09/28/2024    8:25 AM 09/13/2024    8:25 AM  CMP  Glucose 70 - 99 mg/dL 894  872  895   BUN 8 - 23 mg/dL 5  9  5    Creatinine 0.44 - 1.00 mg/dL 9.42  9.33  9.49   Sodium 135 - 145 mmol/L 136  137  139   Potassium 3.5 - 5.1 mmol/L 3.7  3.9  3.8   Chloride 98 - 111 mmol/L 102  104  106   CO2 22 - 32 mmol/L 28  28  28    Calcium  8.9 - 10.3 mg/dL 9.4  8.8  9.0   Total Protein 6.5 - 8.1 g/dL 6.7  6.5  6.4   Total Bilirubin 0.0 - 1.2 mg/dL 0.6  0.3  0.4   Alkaline Phos 38 - 126 U/L 206  179  206   AST 15 - 41 U/L 15  11  11    ALT 0 - 44 U/L 12  6  8        RADIOGRAPHIC STUDIES: I have personally reviewed the radiological images  as listed and agreed with the findings in the report. No results found.    Orders Placed This Encounter  Procedures   Consent Attestation for Oncology Treatment    The patient is informed of risks, benefits, side-effects of the prescribed oncology treatment. Potential short term and long term side effects and response rates discussed. After a long discussion, the patient made informed decision to proceed.:   Yes   CBC with Differential (Cancer Center Only)    Standing Status:   Future    Expected Date:   11/02/2024    Expiration Date:   11/02/2025   CMP (Cancer Center only)    Standing Status:   Future    Expected Date:   11/02/2024    Expiration Date:   11/02/2025   CBC with Differential (Cancer Center Only)    Standing Status:   Future    Expected Date:   11/16/2024    Expiration Date:   11/16/2025   CMP (Cancer Center only)    Standing Status:   Future    Expected Date:   11/16/2024    Expiration Date:   11/16/2025   CBC with Differential (Cancer Center Only)    Standing Status:   Future    Expected Date:   11/30/2024    Expiration Date:   11/30/2025   CMP (Cancer Center only)    Standing Status:   Future    Expected Date:   11/30/2024    Expiration Date:   11/30/2025   CBC with Differential (Cancer Center Only)    Standing Status:   Future    Expected Date:   12/14/2024    Expiration Date:   12/14/2025   CMP (Cancer Center only)    Standing Status:   Future    Expected Date:   12/14/2024    Expiration Date:   12/14/2025   All questions were answered. The patient knows to call the clinic with any problems, questions or concerns. No barriers to learning was detected. The total time spent in the appointment was 40 minutes, including review of chart and various  tests results, discussions about plan of care and coordination of care plan     Onita Mattock, MD 10/26/2024

## 2024-10-26 NOTE — Progress Notes (Signed)
 START ON PATHWAY REGIMEN - Pancreatic Adenocarcinoma     A cycle is every 28 days:     Nab-paclitaxel (protein bound)      Gemcitabine   **Always confirm dose/schedule in your pharmacy ordering system**  Patient Characteristics: Metastatic Disease, Second Line, MSS/pMMR or MSI Unknown, Fluoropyrimidine-Based Therapy First Line Therapeutic Status: Metastatic Disease Line of Therapy: Second Line Microsatellite/Mismatch Repair Status: MSS/pMMR Intent of Therapy: Non-Curative / Palliative Intent, Discussed with Patient

## 2024-10-27 ENCOUNTER — Other Ambulatory Visit: Payer: Self-pay

## 2024-10-28 ENCOUNTER — Inpatient Hospital Stay

## 2024-10-31 ENCOUNTER — Other Ambulatory Visit (HOSPITAL_COMMUNITY): Payer: Self-pay

## 2024-11-02 ENCOUNTER — Other Ambulatory Visit: Payer: Self-pay

## 2024-11-02 ENCOUNTER — Inpatient Hospital Stay: Attending: Hematology

## 2024-11-02 ENCOUNTER — Inpatient Hospital Stay

## 2024-11-02 ENCOUNTER — Inpatient Hospital Stay: Admitting: Dietician

## 2024-11-02 ENCOUNTER — Other Ambulatory Visit (HOSPITAL_COMMUNITY): Payer: Self-pay

## 2024-11-02 VITALS — BP 114/73 | HR 73 | Temp 99.0°F | Resp 16 | Ht 61.5 in | Wt 161.0 lb

## 2024-11-02 DIAGNOSIS — C251 Malignant neoplasm of body of pancreas: Secondary | ICD-10-CM

## 2024-11-02 DIAGNOSIS — C259 Malignant neoplasm of pancreas, unspecified: Secondary | ICD-10-CM | POA: Diagnosis not present

## 2024-11-02 DIAGNOSIS — Z5111 Encounter for antineoplastic chemotherapy: Secondary | ICD-10-CM | POA: Diagnosis present

## 2024-11-02 DIAGNOSIS — C787 Secondary malignant neoplasm of liver and intrahepatic bile duct: Secondary | ICD-10-CM | POA: Insufficient documentation

## 2024-11-02 DIAGNOSIS — Z79633 Long term (current) use of mitotic inhibitor: Secondary | ICD-10-CM | POA: Diagnosis not present

## 2024-11-02 DIAGNOSIS — Z79631 Long term (current) use of antimetabolite agent: Secondary | ICD-10-CM | POA: Insufficient documentation

## 2024-11-02 DIAGNOSIS — E876 Hypokalemia: Secondary | ICD-10-CM

## 2024-11-02 LAB — CMP (CANCER CENTER ONLY)
ALT: 8 U/L (ref 0–44)
AST: 16 U/L (ref 15–41)
Albumin: 4 g/dL (ref 3.5–5.0)
Alkaline Phosphatase: 161 U/L — ABNORMAL HIGH (ref 38–126)
Anion gap: 6 (ref 5–15)
BUN: 6 mg/dL — ABNORMAL LOW (ref 8–23)
CO2: 28 mmol/L (ref 22–32)
Calcium: 9.2 mg/dL (ref 8.9–10.3)
Chloride: 102 mmol/L (ref 98–111)
Creatinine: 0.47 mg/dL (ref 0.44–1.00)
GFR, Estimated: >60 mL/min (ref >60)
Glucose, Bld: 145 mg/dL — ABNORMAL HIGH (ref 70–99)
Potassium: 3.4 mmol/L — ABNORMAL LOW (ref 3.5–5.1)
Sodium: 136 mmol/L (ref 135–145)
Total Bilirubin: 0.9 mg/dL (ref 0.0–1.2)
Total Protein: 7.2 g/dL (ref 6.5–8.1)

## 2024-11-02 LAB — CBC WITH DIFFERENTIAL (CANCER CENTER ONLY)
Abs Immature Granulocytes: 0.02 K/uL (ref 0.00–0.07)
Basophils Absolute: 0 K/uL (ref 0.0–0.1)
Basophils Relative: 0 %
Eosinophils Absolute: 0.1 K/uL (ref 0.0–0.5)
Eosinophils Relative: 1 %
HCT: 37.4 % (ref 36.0–46.0)
Hemoglobin: 12.2 g/dL (ref 12.0–15.0)
Immature Granulocytes: 0 %
Lymphocytes Relative: 12 %
Lymphs Abs: 0.9 K/uL (ref 0.7–4.0)
MCH: 28.2 pg (ref 26.0–34.0)
MCHC: 32.6 g/dL (ref 30.0–36.0)
MCV: 86.4 fL (ref 80.0–100.0)
Monocytes Absolute: 0.8 K/uL (ref 0.1–1.0)
Monocytes Relative: 11 %
Neutro Abs: 5.3 K/uL (ref 1.7–7.7)
Neutrophils Relative %: 76 %
Platelet Count: 184 K/uL (ref 150–400)
RBC: 4.33 MIL/uL (ref 3.87–5.11)
RDW: 14.6 % (ref 11.5–15.5)
WBC Count: 7.1 K/uL (ref 4.0–10.5)
nRBC: 0 % (ref 0.0–0.2)

## 2024-11-02 MED ORDER — POTASSIUM CHLORIDE CRYS ER 20 MEQ PO TBCR
20.0000 meq | EXTENDED_RELEASE_TABLET | Freq: Two times a day (BID) | ORAL | 0 refills | Status: DC
  Filled 2024-11-02 – 2024-11-07 (×2): qty 6, 3d supply, fill #0

## 2024-11-02 MED ORDER — PACLITAXEL PROTEIN-BOUND CHEMO INJECTION 100 MG
125.0000 mg/m2 | Freq: Once | INTRAVENOUS | Status: AC
  Administered 2024-11-02: 225 mg via INTRAVENOUS
  Filled 2024-11-02: qty 45

## 2024-11-02 MED ORDER — METOCLOPRAMIDE HCL 10 MG PO TABS
10.0000 mg | ORAL_TABLET | Freq: Three times a day (TID) | ORAL | 1 refills | Status: DC | PRN
  Filled 2024-11-02 – 2024-11-07 (×2): qty 20, 7d supply, fill #0

## 2024-11-02 MED ORDER — PROCHLORPERAZINE MALEATE 10 MG PO TABS
10.0000 mg | ORAL_TABLET | Freq: Once | ORAL | Status: AC
  Administered 2024-11-02: 10 mg via ORAL
  Filled 2024-11-02: qty 1

## 2024-11-02 MED ORDER — SODIUM CHLORIDE 0.9 % IV SOLN: INTRAVENOUS | Status: DC

## 2024-11-02 MED ORDER — SODIUM CHLORIDE 0.9 % IV SOLN
1000.0000 mg/m2 | Freq: Once | INTRAVENOUS | Status: AC
  Administered 2024-11-02: 1824 mg via INTRAVENOUS
  Filled 2024-11-02: qty 47.97

## 2024-11-02 NOTE — Progress Notes (Signed)
 Verbal order w/readback from Dr. Lanny for Klor-Con  20meq PO once while in infusion d/t K+3.4 and home prescription for Klor-Con  20meq PO BID for 3 days for hypokalemia.  Orders placed and Infusion RN + pt aware of home prescription and infusion prescription.

## 2024-11-02 NOTE — Progress Notes (Signed)
 Verbal order w/readback from Dr. Lanny for home prescription for Reglan 10mg  PO PRN Q8hrs (20 tabs) 1 refill and d/c Compazine  d/t drug interaction with Reglan.  Prescription placed and compazine  order d/c.

## 2024-11-02 NOTE — Patient Instructions (Signed)
 [facility] - A DEPT OF . [facility]  Discharge Instructions: Thank you for choosing [facility] to provide your oncology and hematology care.   If you have a lab appointment with the Cancer Center, please go directly to the Cancer Center and check in at the registration area.   Wear comfortable clothing and clothing appropriate for easy access to any Portacath or PICC line.   We strive to give you quality time with your provider. You may need to reschedule your appointment if you arrive late (15 or more minutes).  Arriving late affects you and other patients whose appointments are after yours.  Also, if you miss three or more appointments without notifying the office, you may be dismissed from the clinic at the provider's discretion.      For prescription refill requests, have your pharmacy contact our office and allow 72 hours for refills to be completed.    Today you received the following chemotherapy and/or immunotherapy agents:Paclitaxel-protein bound (Abraxane), Gemcitabine (Gemzar)   To help prevent nausea and vomiting after your treatment, we encourage you to take your nausea medication as directed.  BELOW ARE SYMPTOMS THAT SHOULD BE REPORTED IMMEDIATELY: *FEVER GREATER THAN 100.4 F (38 C) OR HIGHER *CHILLS OR SWEATING *NAUSEA AND VOMITING THAT IS NOT CONTROLLED WITH YOUR NAUSEA MEDICATION *UNUSUAL SHORTNESS OF BREATH *UNUSUAL BRUISING OR BLEEDING *URINARY PROBLEMS (pain or burning when urinating, or frequent urination) *BOWEL PROBLEMS (unusual diarrhea, constipation, pain near the anus) TENDERNESS IN MOUTH AND THROAT WITH OR WITHOUT PRESENCE OF ULCERS (sore throat, sores in mouth, or a toothache) UNUSUAL RASH, SWELLING OR PAIN  UNUSUAL VAGINAL DISCHARGE OR ITCHING   Items with * indicate a potential emergency and should be followed up as soon as possible or go to the Emergency Department if any problems should occur.  Please show  the CHEMOTHERAPY ALERT CARD or IMMUNOTHERAPY ALERT CARD at check-in to the Emergency Department and triage nurse.  Should you have questions after your visit or need to cancel or reschedule your appointment, please contact [facility] - A DEPT OF [facility]  Dept: [phone]  and follow the prompts.  Office hours are 8:00 a.m. to 4:30 p.m. Monday - Friday. Please note that voicemails left after 4:00 p.m. may not be returned until the following business day.  We are closed weekends and major holidays. You have access to a nurse at all times for urgent questions. Please call the main number to the clinic Dept: [phone] and follow the prompts.   For any non-urgent questions, you may also contact your provider using MyChart. We now offer e-Visits for anyone 53 and older to request care online for non-urgent symptoms. For details visit [email or web address].   Also download the MyChart app! Go to the app store, search MyChart, open the app, select [facility], and log in with your MyChart username and password.

## 2024-11-02 NOTE — Progress Notes (Signed)
 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 Folfirinox discontinued after first cycle due to reaction. Changed to Nalirifox q28d. Oxaliplatin  stopped due to neuropathy. Now with progression to liver. Patient starting gemcitabine/abraxane q28d 11/5. She is under the care of Dr. Lanny.   Met with patient in infusion. She has been feeling down since learning of cancer progression. Patient relying on her strong faith and reports giving it to God this morning. Patient feeling some better this afternoon, however nervous about starting new regimen.   Patient has not been eating well. Says she ate one small meal Friday and Saturday. Patient did not eat Sunday. Recalls a bite of salmon patty and a few air fried catfish fingers for dinner as well as small bowl of ice cream before bed. She had cheese toast before appointments today and reports eating a cup of yogurt in infusion. Patient has noticed foods are slow to digest. Says it feels like it takes a couple of days for food to move into stomach. This promotes early satiety and a further decline in oral intake.   Medications: reviewed   Labs: K 3.4, glucose 145, BUN 6, alk phos 161  Anthropometrics: Wt 161 lb today decreased 2.4% in 7 days - severe for time frame  10/29 - 165 lb 12.8 oz 10/1 - 167 lb 14.4 oz  9/16 - 169 lb 6.4 oz    NUTRITION DIAGNOSIS: Unintended wt loss - ongoing    INTERVENTION:  Encourage oral intake within first hour of waking followed by bites/sips q2h  Encourage high calorie high protein foods Suggested resuming Ensure shakes - she does not care for these May benefit from reglan for slow motility - message sent to Dr. Lanny Support and encouragement     MONITORING, EVALUATION, GOAL: wt trends, intake    NEXT VISIT: Wednesday November 19 during infusion

## 2024-11-07 ENCOUNTER — Other Ambulatory Visit (HOSPITAL_COMMUNITY): Payer: Self-pay

## 2024-11-07 ENCOUNTER — Other Ambulatory Visit: Payer: Self-pay

## 2024-11-09 ENCOUNTER — Ambulatory Visit

## 2024-11-09 ENCOUNTER — Encounter: Payer: Self-pay | Admitting: Hematology

## 2024-11-09 ENCOUNTER — Telehealth: Payer: Self-pay

## 2024-11-09 ENCOUNTER — Encounter

## 2024-11-09 ENCOUNTER — Telehealth: Payer: Self-pay | Admitting: Hematology

## 2024-11-09 ENCOUNTER — Ambulatory Visit: Admitting: Hematology

## 2024-11-09 NOTE — Telephone Encounter (Signed)
 Pt called stating she has left several voicemail messages for Scheduling regarding scheduling her PICC dressing changes.  Pt stated was never called back and is due for a dressing change.  Stated unfortunately Dr Demetra desk nurse cannot schedule those appts and will transfer pt to Dr Demetra Scheduler Deshjna.  Transferred pt's call to Deshjna's extension.  Also, sent staff messasge to Deshjna to give pt a call to get her scheduled.

## 2024-11-09 NOTE — Telephone Encounter (Signed)
 Called the pt back like requested and Lvm to give us  a call back.

## 2024-11-10 ENCOUNTER — Other Ambulatory Visit: Payer: Self-pay

## 2024-11-11 ENCOUNTER — Inpatient Hospital Stay

## 2024-11-15 NOTE — Progress Notes (Unsigned)
 Patient Care Team: [name], [name], [name] as PCP - General (Nurse Practitioner) [name], MD as Consulting Physician (Hematology and Oncology) [name], MD as Consulting Physician (Radiation Oncology) [name], MD as Consulting Physician (General Surgery) [name], MD as Consulting Physician (Hematology and Oncology)  Clinic Day:  11/16/2024  Referring physician: [name], MD  ASSESSMENT & PLAN:   Assessment & Plan: Pancreatic cancer [facility]) -stage IV with liver mets, MSS, KRAS G12R mutation (+) -diagnosed in 10/2023 -Patient has a history of breast cancer. She presents with dyspnea, chest discomfort, decreased appetite, and weight loss. She also reports new onset back pain. -CT showed a 2.9cm mass in pancreatic neck/body, and multiple liver mets, liver biopsy confirmed adenocarcinoma  -I reviewed the aggressive nature of pancreatic cancer, and the incurable nature of her disease due to diffuse liver metastasis. -she start first line chemo Nalirifox chemotherapy regimen on 11/12, with dose reduction for first cycle due to poor PS -Performed molecular testing on tumor tissue to identify potential targeted therapy options, unfortunately no actionable mutations detected  -She underwent genetic testing and came back negative  -restaging CT 04/20/2024 showed mild improvement, chemo changed to 5-fu and liposomal irinotecan  in April 2025 -CT 07/28/2024 showed overall stable disease, -10/19/2024 - CT CAP showed cancer progression in the liver. Plan to change her tx to gemcitabine and abraxane  --she started new chemotherapy gemcitabine and abraxane on 11/02/2024.  She has tolerated well with mild increased nausea and decreased appetite.  No evidence of worsening neuropathy.  Will proceed with chemotherapy plan as scheduled.    Mild dehydration Patient with low/normal blood pressure of 97/68.  Sodium is 132 today.  Patient with slightly decreased appetite and increased fatigue.  Will  give 1 L normal saline during chemotherapy treatment today.  Pancreatic cancer with metastases to liver Chemotherapy changed to gemcitabine and Abraxane, first dose given 11/02/2024.  Patient reports increased nausea for which she does have medication at home to control symptoms.  She also reports decreased appetite.  Her weight is maintained.  She does see nutrition later today.  Recommend increased protein and calorie intake to avoid weight loss at this time.  She also reports mild constipation.  Does take MiraLAX and/or senna to manage symptoms.  Peripheral neuropathy Patient reporting baseline peripheral neuropathy.  No change (worsening) since start of new chemotherapy on 11/02/2024.  States she has restarted taking gabapentin  to reduce symptoms.  Will need to add back to her medication list and fill as needed.  Plan Patient seen in infusion suite today. Labs reviewed. - Stable and mild anemia. - Mild hyponatremia.  Stable elevation of alkaline phosphatase. - CA 19-9 pending. 1 L normal saline to be given during chemotherapy today to manage mild dehydration. Labs and patient presentation are appropriate for treatment today. Proceed with cycle 1 day 15 chemotherapy gemcitabine and Abraxane. PICC line management, labs, follow-up, and cycle 2 day 1 chemotherapy as scheduled.  The patient understands the plans discussed today and is in agreement with them.  She knows to contact our office if she develops concerns prior to her next appointment.  I provided 25 minutes of face-to-face time during this encounter and > 50% was spent counseling as documented under my assessment and plan.    Powell FORBES Lessen, NP  [facility] - A DEPT OF [facility] [address] [place] [address] Dept: [phone] Dept Fax: [phone]   No orders  of the defined types were placed in this encounter.     CHIEF COMPLAINT:  CC: Malignant  neoplasm of body of pancreas  Current Treatment: Chemotherapy gemcitabine and Abraxane every 14 days (started 11/02/2024)  INTERVAL HISTORY:  Alanys is here today for repeat clinical assessment. She last saw Dr. Lanny on 10/26/2024.  She started new chemotherapy, gemcitabine and Abraxane 11/02/2024.  She is on every 2-week regimen to reduce the risk of worsening peripheral neuropathy.  She reports increased nausea and decreased appetite with new chemotherapy.  Managing nausea with prescribed antiemetics.  She is consuming appropriate protein and calories.  Weight is maintained.  Neuropathy is at baseline without worsening due to new chemotherapy.  She is also experiencing constipation for which she takes MiraLAX.  She  denies chest pain, chest pressure, or shortness of breath. She denies headaches or visual disturbances. He denies abdominal pain, vomiting, or changes in bowel or bladder habits.  She denies fevers or chills. She denies pain.   I have reviewed the past medical history, past surgical history, social history and family history with the patient and they are unchanged from previous note.  ALLERGIES:  is allergic to irinotecan  liposome.  MEDICATIONS:  Current Outpatient Medications  Medication Sig Dispense Refill   apixaban  (ELIQUIS ) 5 MG TABS tablet Take 1 tablet (5 mg total) by mouth 2 (two) times daily. 60 tablet 5   cetirizine (ZYRTEC) 10 MG chewable tablet Chew 10 mg by mouth daily.     metoCLOPramide (REGLAN) 10 MG tablet Take 1 tablet (10 mg total) by mouth every 8 (eight) hours as needed for nausea. 20 tablet 1   ondansetron  (ZOFRAN ) 8 MG tablet Take 1 tablet (8 mg total) by mouth every 8 (eight) hours as needed for nausea or vomiting. 30 tablet 1   potassium chloride  SA (KLOR-CON  M) 20 MEQ tablet Take 1 tablet (20 mEq total) by mouth 2 (two) times daily. 6 tablet 0   No current facility-administered medications for this visit.   Facility-Administered Medications Ordered in Other  Visits  Medication Dose Route Frequency Provider Last Rate Last Admin   0.9 %  sodium chloride  infusion   Intravenous Continuous [name], MD 10 mL/hr at 11/16/24 0831 New Bag at 11/16/24 0831   gemcitabine (GEMZAR) 1,824 mg in sodium chloride  0.9 % 250 mL chemo infusion  1,000 mg/m2 (Treatment Plan Recorded) Intravenous Once [name], MD       PACLitaxel-protein bound (ABRAXANE) chemo infusion 225 mg  125 mg/m2 (Treatment Plan Recorded) Intravenous Once [name], MD        HISTORY OF PRESENT ILLNESS:   Oncology History  Malignant neoplasm of overlapping sites of left breast in female, estrogen receptor positive (HCC)  08/04/2018 Initial Diagnosis   Screening mammogram detected left breast mass at 9:30 position 1.2 x 1.2 x 1.1 cm.  Closer to the nipple at 9:30 position 0.8 cm lesion span 3.6 cm and a 1.5 cm apart.  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 position 2 cm from nipple, at 1 cm from nipple PASH, lymph node benign, ER 90%, PR 95%, Ki-67 20%, HER-2 negative by IHC 1+, T1CN0 stage Ia AJCC 8   09/29/2018 Surgery   Left lumpectomy: Grade 3 IDC, 1.7 cm, margins negative, lymphovascular invasion present, 0/2 lymph nodes negative, T1c N0 stage Ia   10/06/2018 Cancer Staging   Staging form: Breast, AJCC 8th Edition -  Pathologic: Stage IA (pT1c, pN0(sn), cM0, G3, ER+, PR+, HER2-) - Signed by [name], MD on 10/06/2018   10/22/2018 Oncotype testing   Oncotype score 13: Distant recurrence at 9 years with hormone therapy alone 4%   11/12/2018 - 12/27/2018 Radiation Therapy   Adjuvant radiation therapy   12/27/2018 -  Anti-estrogen oral therapy   Antiestrogen therapy with letrozole  2.5 mg daily   11/30/2023 Genetic Testing   Negative genetic testing on the CancerNext-Expanded+RNAinsight panel.  VUS identified in KIT c.2083G>C and PMS2 c.-1C>A.  The report date is 11/30/2023.  The CancerNext-Expanded gene panel  offered by [facility] and includes sequencing, rearrangement, and RNA analysis for the following 76 genes: AIP, ALK, APC, ATM, AXIN2, BAP1, BARD1, BMPR1A, BRCA1, BRCA2, BRIP1, CDC73, CDH1, CDK4, CDKN1B, CDKN2A, CEBPA, CHEK2, CTNNA1, DDX41, DICER1, ETV6, FH, FLCN, GATA2, LZTR1, MAX, MBD4, MEN1, MET, MLH1, MSH2, MSH3, MSH6, MUTYH, NF1, NF2, NTHL1, PALB2, PHOX2B, PMS2, POT1, PRKAR1A, PTCH1, PTEN, RAD51C, RAD51D, RB1, RET, RUNX1, SDHA, SDHAF2, SDHB, SDHC, SDHD, SMAD4, SMARCA4, SMARCB1, SMARCE1, STK11, SUFU, TMEM127, TP53, TSC1, TSC2, VHL, and WT1 (sequencing and deletion/duplication); EGFR, HOXB13, KIT, MITF, PDGFRA, POLD1, and POLE (sequencing only); EPCAM and GREM1 (deletion/duplication only).    Pancreatic cancer (HCC)  11/03/2023 Initial Diagnosis   Pancreatic cancer (HCC)   11/11/2023 - 10/14/2024 Chemotherapy   Patient is on Treatment Plan : PANCREAS NALIRIFOX D1, 15 Q28D     11/30/2023 Genetic Testing   Negative genetic testing on the CancerNext-Expanded+RNAinsight panel.  VUS identified in KIT c.2083G>C and PMS2 c.-1C>A.  The report date is 11/30/2023.  The CancerNext-Expanded gene panel offered by [facility] and includes sequencing, rearrangement, and RNA analysis for the following 76 genes: AIP, ALK, APC, ATM, AXIN2, BAP1, BARD1, BMPR1A, BRCA1, BRCA2, BRIP1, CDC73, CDH1, CDK4, CDKN1B, CDKN2A, CEBPA, CHEK2, CTNNA1, DDX41, DICER1, ETV6, FH, FLCN, GATA2, LZTR1, MAX, MBD4, MEN1, MET, MLH1, MSH2, MSH3, MSH6, MUTYH, NF1, NF2, NTHL1, PALB2, PHOX2B, PMS2, POT1, PRKAR1A, PTCH1, PTEN, RAD51C, RAD51D, RB1, RET, RUNX1, SDHA, SDHAF2, SDHB, SDHC, SDHD, SMAD4, SMARCA4, SMARCB1, SMARCE1, STK11, SUFU, TMEM127, TP53, TSC1, TSC2, VHL, and WT1 (sequencing and deletion/duplication); EGFR, HOXB13, KIT, MITF, PDGFRA, POLD1, and POLE (sequencing only); EPCAM and GREM1 (deletion/duplication only).    01/29/2024 Imaging   CT abdomen and pelvis with contrast  IMPRESSION: 1. Diminished size of a hypodense mass of the  pancreatic neck, consistent with treatment response. 2. Diminished size and hypodensity of numerous liver metastases, with new overlying pseudocirrhotic capsular retraction, consistent with treatment response. 3. 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 4. Unchanged prominent, nonspecific retroperitoneal lymph nodes.  5. Underlying hepatic steatosis.   11/02/2024 -  Chemotherapy   Patient is on Treatment Plan : PANCREATIC Abraxane D1,8,15 + Gemcitabine D1,8,15 q28d         REVIEW OF SYSTEMS:   Constitutional: Denies fevers, chills or abnormal weight loss.  Decreased appetite.  Eyes: Denies blurriness of vision Ears, nose, mouth, throat, and face: Denies mucositis or sore throat Respiratory: Denies cough, dyspnea or wheezes Cardiovascular: Denies palpitation, chest discomfort or lower extremity swelling Gastrointestinal: Increased nausea for which she is taking prescribed antiemetics.  Persistent constipation and taking MiraLAX when needed. Skin: Denies abnormal skin rashes Lymphatics: Denies new lymphadenopathy or easy bruising Neurological:Denies numbness, tingling or new weaknesses.  Baseline neuropathy.  Restarted gabapentin . Behavioral/Psych: Mood is stable, no new changes  All other systems were reviewed with the patient and are negative.   VITALS:   Today's Vitals   11/16/24 0822  BP: 97/86  Pulse: 95  Resp: 16  Temp: 98.4 F (36.9 C)  SpO2: 100%  Weight: 158 lb 12.8 oz (72 kg)   Body mass index is 29.52 kg/m.   Wt Readings from Last 3 Encounters:  11/16/24 158 lb 8 oz (71.9 kg)  11/16/24 158 lb 12.8 oz (72 kg)  11/02/24 161 lb (73 kg)    Body mass index is 29.52 kg/m.  Performance status (ECOG): 1 - Symptomatic but completely ambulatory  PHYSICAL EXAM:   GENERAL:alert, no distress and comfortable SKIN: skin color, texture, turgor are normal, no rashes or significant lesions EYES: normal, Conjunctiva are pink and non-injected,  sclera clear OROPHARYNX:no exudate, no erythema and lips, buccal mucosa, and tongue normal  NECK: supple, thyroid normal size, non-tender, without nodularity LYMPH:  no palpable lymphadenopathy in the cervical, axillary or inguinal LUNGS: clear to auscultation and percussion with normal breathing effort HEART: regular rate & rhythm and no murmurs and no lower extremity edema ABDOMEN:abdomen soft, non-tender and normal bowel sounds Musculoskeletal:no cyanosis of digits and no clubbing  NEURO: alert & oriented x 3 with fluent speech, no focal motor/sensory deficits  LABORATORY DATA:  I have reviewed the data as listed    Component Value Date/Time   NA 132 (L) 11/16/2024 0737   NA 136 06/10/2022 1152   K 3.9 11/16/2024 0737   CL 96 (L) 11/16/2024 0737   CO2 24 11/16/2024 0737   GLUCOSE 124 (H) 11/16/2024 0737   BUN <5 (L) 11/16/2024 0737   BUN 7 (L) 06/10/2022 1152   CREATININE 0.49 11/16/2024 0737   CALCIUM  9.2 11/16/2024 0737   PROT 7.1 11/16/2024 0737   PROT 7.6 06/10/2022 1152   ALBUMIN 3.9 11/16/2024 0737   ALBUMIN 4.6 06/10/2022 1152   AST 26 11/16/2024 0737   ALT 12 11/16/2024 0737   ALKPHOS 192 (H) 11/16/2024 0737   BILITOT 0.8 11/16/2024 0737   GFRNONAA >60 11/16/2024 0737    Lab Results  Component Value Date   WBC 6.5 11/16/2024   NEUTROABS 4.4 11/16/2024   HGB 11.4 (L) 11/16/2024   HCT 34.4 (L) 11/16/2024   MCV 84.5 11/16/2024   PLT 302 11/16/2024     RADIOGRAPHIC STUDIES: CT CHEST ABDOMEN PELVIS W CONTRAST Result Date: 10/21/2024 EXAM: CT CHEST, ABDOMEN AND PELVIS WITH CONTRAST 10/19/2024 10:35:12 AM TECHNIQUE: CT of the chest, abdomen and pelvis was performed with the administration of 100 mL iohexol  (OMNIPAQUE ) 300 MG/ML solution. Multiplanar reformatted images are provided for review. Automated exposure control, iterative reconstruction, and/or weight based adjustment of the mA/kV was utilized to reduce the radiation dose to as low as reasonably  achievable. COMPARISON: CT chest, abdomen and pelvis 07/28/2024. CLINICAL HISTORY: Pancreatic cancer, assess treatment response. Malignant neoplasm of body of pancreas (HCC). FINDINGS: CHEST: MEDIASTINUM AND LYMPH NODES: Left PICC terminates at the cavoatrial junction. Heart size is stable and top normal. Stable dilated main pulmonary artery (3.5 cm diameter). The central airways are clear. No mediastinal, hilar or axillary lymphadenopathy. LUNGS AND PLEURA: Stable tiny 2 mm subpleural posterior right upper lobe nodule along the major fissure on series 12, image 61. Stable 3 mm ground-glass peripheral left upper lobe nodule on image 37. No acute consolidative airspace disease or new significant pulmonary nodules. No pleural effusion or pneumothorax. ABDOMEN AND PELVIS: LIVER: Anterior left liver 6.0 x 4.3 cm hypoenhancing mass on series 2, image 33, substantially increased from 1.7 x 1.3 cm on 07/28/2024 CT. New indistinct hypodense 1.0 cm anterior segment 2 left liver lesion on  series 7, image 85. Numerous additional subcentimeter hypodense lesions scattered throughout both liver lobes are stable, for example 0.8 cm in the segment 4a left liver on series 7, image 86. GALLBLADDER AND BILE DUCTS: Gallbladder is unremarkable. No biliary ductal dilatation. SPLEEN: No acute abnormality. PANCREAS: Inferior pancreatic body 1.8 x 1.2 cm hypodense mass on series 7, image 112, stable. Stable atrophy and mild duct dilation in the pancreatic body and tail. ADRENAL GLANDS: No acute abnormality. KIDNEYS, URETERS AND BLADDER: No stones in the kidneys or ureters. No hydronephrosis. No perinephric or periureteral stranding. Chronic mild diffuse bladder wall thickening is unchanged. GI AND BOWEL: Stomach demonstrates no acute abnormality. There is no bowel obstruction. REPRODUCTIVE ORGANS: No acute abnormality. PERITONEUM AND RETROPERITONEUM: No ascites. No free air. VASCULATURE: Abdominal aorta is normal in caliber. Mildly  atherosclerotic nonaneurysmal thoracic aorta. ABDOMINAL AND PELVIS LYMPH NODES: No lymphadenopathy. REPRODUCTIVE ORGANS: No acute abnormality. BONES AND SOFT TISSUES: Mild thoracic spondylosis. No acute osseous abnormality. No focal soft tissue abnormality. IMPRESSION: 1. Marked interval enlargement of anterior left liver 6 cm metastasis. Separate new 1 cm segment 2 anterior left liver metastasis. 2. Stable 1.8 cm inferior pancreatic body mass. 3. No findings to suggest metastatic disease in the chest. Electronically signed by: Selinda Blue MD 10/21/2024 09:54 AM EDT RP Workstation: [id number]

## 2024-11-16 ENCOUNTER — Encounter

## 2024-11-16 ENCOUNTER — Inpatient Hospital Stay (HOSPITAL_BASED_OUTPATIENT_CLINIC_OR_DEPARTMENT_OTHER): Admitting: Nurse Practitioner

## 2024-11-16 ENCOUNTER — Encounter: Payer: Self-pay | Admitting: Nurse Practitioner

## 2024-11-16 ENCOUNTER — Inpatient Hospital Stay: Admitting: Dietician

## 2024-11-16 ENCOUNTER — Inpatient Hospital Stay

## 2024-11-16 ENCOUNTER — Encounter: Payer: Self-pay | Admitting: Hematology

## 2024-11-16 VITALS — BP 97/86 | HR 95 | Temp 98.4°F | Resp 16 | Wt 158.8 lb

## 2024-11-16 VITALS — BP 97/68 | HR 95 | Temp 98.4°F | Resp 16 | Wt 158.5 lb

## 2024-11-16 DIAGNOSIS — C251 Malignant neoplasm of body of pancreas: Secondary | ICD-10-CM

## 2024-11-16 DIAGNOSIS — Z5111 Encounter for antineoplastic chemotherapy: Secondary | ICD-10-CM | POA: Diagnosis not present

## 2024-11-16 DIAGNOSIS — Z170 Estrogen receptor positive status [ER+]: Secondary | ICD-10-CM

## 2024-11-16 DIAGNOSIS — C259 Malignant neoplasm of pancreas, unspecified: Secondary | ICD-10-CM

## 2024-11-16 LAB — CBC WITH DIFFERENTIAL (CANCER CENTER ONLY)
Abs Immature Granulocytes: 0.02 K/uL (ref 0.00–0.07)
Basophils Absolute: 0 K/uL (ref 0.0–0.1)
Basophils Relative: 0 %
Eosinophils Absolute: 0.1 K/uL (ref 0.0–0.5)
Eosinophils Relative: 2 %
HCT: 34.4 % — ABNORMAL LOW (ref 36.0–46.0)
Hemoglobin: 11.4 g/dL — ABNORMAL LOW (ref 12.0–15.0)
Immature Granulocytes: 0 %
Lymphocytes Relative: 15 %
Lymphs Abs: 1 K/uL (ref 0.7–4.0)
MCH: 28 pg (ref 26.0–34.0)
MCHC: 33.1 g/dL (ref 30.0–36.0)
MCV: 84.5 fL (ref 80.0–100.0)
Monocytes Absolute: 1 K/uL (ref 0.1–1.0)
Monocytes Relative: 15 %
Neutro Abs: 4.4 K/uL (ref 1.7–7.7)
Neutrophils Relative %: 68 %
Platelet Count: 302 K/uL (ref 150–400)
RBC: 4.07 MIL/uL (ref 3.87–5.11)
RDW: 13.5 % (ref 11.5–15.5)
WBC Count: 6.5 K/uL (ref 4.0–10.5)
nRBC: 0 % (ref 0.0–0.2)

## 2024-11-16 LAB — CMP (CANCER CENTER ONLY)
ALT: 12 U/L (ref 0–44)
AST: 26 U/L (ref 15–41)
Albumin: 3.9 g/dL (ref 3.5–5.0)
Alkaline Phosphatase: 192 U/L — ABNORMAL HIGH (ref 38–126)
Anion gap: 12 (ref 5–15)
BUN: <5 mg/dL — ABNORMAL LOW (ref 8–23)
CO2: 24 mmol/L (ref 22–32)
Calcium: 9.2 mg/dL (ref 8.9–10.3)
Chloride: 96 mmol/L — ABNORMAL LOW (ref 98–111)
Creatinine: 0.49 mg/dL (ref 0.44–1.00)
GFR, Estimated: >60 mL/min (ref >60)
Glucose, Bld: 124 mg/dL — ABNORMAL HIGH (ref 70–99)
Potassium: 3.9 mmol/L (ref 3.5–5.1)
Sodium: 132 mmol/L — ABNORMAL LOW (ref 135–145)
Total Bilirubin: 0.8 mg/dL (ref 0.0–1.2)
Total Protein: 7.1 g/dL (ref 6.5–8.1)

## 2024-11-16 MED ORDER — SODIUM CHLORIDE 0.9 % IV SOLN: INTRAVENOUS | Status: DC

## 2024-11-16 MED ORDER — PACLITAXEL PROTEIN-BOUND CHEMO INJECTION 100 MG
125.0000 mg/m2 | Freq: Once | INTRAVENOUS | Status: AC
  Administered 2024-11-16: 225 mg via INTRAVENOUS
  Filled 2024-11-16: qty 45

## 2024-11-16 MED ORDER — PROCHLORPERAZINE MALEATE 10 MG PO TABS
10.0000 mg | ORAL_TABLET | Freq: Once | ORAL | Status: AC
  Administered 2024-11-16: 10 mg via ORAL
  Filled 2024-11-16: qty 1

## 2024-11-16 MED ORDER — SODIUM CHLORIDE 0.9 % IV SOLN
1000.0000 mg/m2 | Freq: Once | INTRAVENOUS | Status: AC
  Administered 2024-11-16: 1824 mg via INTRAVENOUS
  Filled 2024-11-16: qty 47.97

## 2024-11-16 MED ORDER — SODIUM CHLORIDE 0.9 % IV SOLN: Freq: Once | INTRAVENOUS | Status: AC

## 2024-11-16 NOTE — Patient Instructions (Signed)
 [facility] - A DEPT OF . [facility]  Discharge Instructions: Thank you for choosing [facility] to provide your oncology and hematology care.   If you have a lab appointment with the Cancer Center, please go directly to the Cancer Center and check in at the registration area.   Wear comfortable clothing and clothing appropriate for easy access to any Portacath or PICC line.   We strive to give you quality time with your provider. You may need to reschedule your appointment if you arrive late (15 or more minutes).  Arriving late affects you and other patients whose appointments are after yours.  Also, if you miss three or more appointments without notifying the office, you may be dismissed from the clinic at the provider's discretion.      For prescription refill requests, have your pharmacy contact our office and allow 72 hours for refills to be completed.    Today you received the following chemotherapy and/or immunotherapy agents:Paclitaxel-protein bound (Abraxane), Gemcitabine (Gemzar)   To help prevent nausea and vomiting after your treatment, we encourage you to take your nausea medication as directed.  BELOW ARE SYMPTOMS THAT SHOULD BE REPORTED IMMEDIATELY: *FEVER GREATER THAN 100.4 F (38 C) OR HIGHER *CHILLS OR SWEATING *NAUSEA AND VOMITING THAT IS NOT CONTROLLED WITH YOUR NAUSEA MEDICATION *UNUSUAL SHORTNESS OF BREATH *UNUSUAL BRUISING OR BLEEDING *URINARY PROBLEMS (pain or burning when urinating, or frequent urination) *BOWEL PROBLEMS (unusual diarrhea, constipation, pain near the anus) TENDERNESS IN MOUTH AND THROAT WITH OR WITHOUT PRESENCE OF ULCERS (sore throat, sores in mouth, or a toothache) UNUSUAL RASH, SWELLING OR PAIN  UNUSUAL VAGINAL DISCHARGE OR ITCHING   Items with * indicate a potential emergency and should be followed up as soon as possible or go to the Emergency Department if any problems should occur.  Please show  the CHEMOTHERAPY ALERT CARD or IMMUNOTHERAPY ALERT CARD at check-in to the Emergency Department and triage nurse.  Should you have questions after your visit or need to cancel or reschedule your appointment, please contact [facility] - A DEPT OF [facility]  Dept: [phone]  and follow the prompts.  Office hours are 8:00 a.m. to 4:30 p.m. Monday - Friday. Please note that voicemails left after 4:00 p.m. may not be returned until the following business day.  We are closed weekends and major holidays. You have access to a nurse at all times for urgent questions. Please call the main number to the clinic Dept: [phone] and follow the prompts.   For any non-urgent questions, you may also contact your provider using MyChart. We now offer e-Visits for anyone 53 and older to request care online for non-urgent symptoms. For details visit [email or web address].   Also download the MyChart app! Go to the app store, search MyChart, open the app, select [facility], and log in with your MyChart username and password.

## 2024-11-16 NOTE — Assessment & Plan Note (Addendum)
-  stage IV with liver mets, MSS, KRAS G12R mutation (+) -diagnosed in 10/2023 -Patient has a history of breast cancer. She presents with dyspnea, chest discomfort, decreased appetite, and weight loss. She also reports new onset back pain. -CT showed a 2.9cm mass in pancreatic neck/body, and multiple liver mets, liver biopsy confirmed adenocarcinoma  -I reviewed the aggressive nature of pancreatic cancer, and the incurable nature of her disease due to diffuse liver metastasis. -she start first line chemo Nalirifox chemotherapy regimen on 11/12, with dose reduction for first cycle due to poor PS -Performed molecular testing on tumor tissue to identify potential targeted therapy options, unfortunately no actionable mutations detected  -She underwent genetic testing and came back negative  -restaging CT 04/20/2024 showed mild improvement, chemo changed to 5-fu and liposomal irinotecan  in April 2025 -CT 07/28/2024 showed overall stable disease, -10/19/2024 - CT CAP showed cancer progression in the liver. Plan to change her tx to gemcitabine  and abraxane   --she started new chemotherapy gemcitabine  and abraxane  on 11/02/2024.  She has tolerated well with mild increased nausea and decreased appetite.  No evidence of worsening neuropathy.  Will proceed with chemotherapy plan as scheduled.

## 2024-11-16 NOTE — Progress Notes (Signed)
 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 Folfirinox discontinued after first cycle due to reaction. Changed to Nalirifox q28d. Oxaliplatin  stopped due to neuropathy. Now with progression to liver. Patient starting gemcitabine/abraxane q28d 11/5. She is under the care of Dr. Lanny.   Met with patient in infusion. Feeling sleepy this morning after compazine . Patient reports new treatment was different. Had persistent nausea which resulted in decreased oral intake. She reports decreased fluid intake the last few days. Despite a rough cycle, she is in good spirits. Planning to make taco soup when she gets home. She tolerates this well and eats on it for a few days. Patient is looking forward to the holiday. Planning to travel and celebrate with family.    Medications: reviewed   Labs: Na 132, glucose 124, BUN <5  Anthropometrics: Wt 158 lb 12.8 oz today - trending down  11/5 - 161 lb  10/29 - 165 lb 12.8 oz  10/1 - 167 lb 14.4 oz 9/16 - 169 lb 6.4 oz    NUTRITION DIAGNOSIS: Unintended wt loss - ongoing    INTERVENTION:  Reviewed strategies for nausea, foods best tolerated and foods to avoid Continue antiemetics + reglan as prescribed  Encourage resuming Ensure for added calories/protein if tolerated      MONITORING, EVALUATION, GOAL: wt trends, intake   NEXT VISIT: Wednesday December 17 during infusion

## 2024-11-17 LAB — CANCER ANTIGEN 19-9: CA 19-9: 4300 U/mL — ABNORMAL HIGH (ref 0–35)

## 2024-11-29 ENCOUNTER — Other Ambulatory Visit: Payer: Self-pay | Admitting: Nurse Practitioner

## 2024-11-29 DIAGNOSIS — C251 Malignant neoplasm of body of pancreas: Secondary | ICD-10-CM

## 2024-11-29 NOTE — Assessment & Plan Note (Addendum)
-  stage IV with liver mets, MSS, KRAS G12R mutation (+) -diagnosed in 10/2023 -Patient has a history of breast cancer. She presents with dyspnea, chest discomfort, decreased appetite, and weight loss. She also reports new onset back pain. -CT showed a 2.9cm mass in pancreatic neck/body, and multiple liver mets, liver biopsy confirmed adenocarcinoma  -I reviewed the aggressive nature of pancreatic cancer, and the incurable nature of her disease due to diffuse liver metastasis. -she start first line chemo Nalirifox chemotherapy regimen on 11/12, with dose reduction for first cycle due to poor PS -Performed molecular testing on tumor tissue to identify potential targeted therapy options, unfortunately no actionable mutations detected  -She underwent genetic testing and came back negative  -restaging CT 04/20/2024 showed mild improvement, chemo changed to 5-fu and liposomal irinotecan  in April 2025 -CT 07/28/2024 showed overall stable disease, -10/19/2024 - CT CAP showed cancer progression in the liver. Plan to change her tx to gemcitabine  and abraxane   --she started new chemotherapy gemcitabine  and abraxane  on 11/02/2024.  She has tolerated well with mild increased nausea and decreased appetite.  No evidence of worsening neuropathy.  Will proceed with chemotherapy plan as scheduled. - 11/30/2024 -she presents for cycle 2 day 1 chemotherapy gemcitabine  and Abraxane .  Has had considerable increase in constipation causing epigastric tenderness and further decrease of appetite.  Overall, labs and presentation are appropriate for treatment and will proceed as scheduled.

## 2024-11-29 NOTE — Progress Notes (Unsigned)
 Patient Care Team: [name], [name], [name] as PCP - General (Nurse Practitioner) [name], MD as Consulting Physician (Hematology and Oncology) [name], MD as Consulting Physician (Radiation Oncology) [name], MD as Consulting Physician (General Surgery) [name], MD as Consulting Physician (Hematology and Oncology)  Clinic Day:  11/30/2024  Referring physician: [name], MD  ASSESSMENT & PLAN:   Assessment & Plan: Pancreatic cancer [facility]) -stage IV with liver mets, MSS, KRAS G12R mutation (+) -diagnosed in 10/2023 -Patient has a history of breast cancer. She presents with dyspnea, chest discomfort, decreased appetite, and weight loss. She also reports new onset back pain. -CT showed a 2.9cm mass in pancreatic neck/body, and multiple liver mets, liver biopsy confirmed adenocarcinoma  -I reviewed the aggressive nature of pancreatic cancer, and the incurable nature of her disease due to diffuse liver metastasis. -she start first line chemo Nalirifox chemotherapy regimen on 11/12, with dose reduction for first cycle due to poor PS -Performed molecular testing on tumor tissue to identify potential targeted therapy options, unfortunately no actionable mutations detected  -She underwent genetic testing and came back negative  -restaging CT 04/20/2024 showed mild improvement, chemo changed to 5-fu and liposomal irinotecan  in April 2025 -CT 07/28/2024 showed overall stable disease, -10/19/2024 - CT CAP showed cancer progression in the liver. Plan to change her tx to gemcitabine  and abraxane   --she started new chemotherapy gemcitabine  and abraxane  on 11/02/2024.  She has tolerated well with mild increased nausea and decreased appetite.  No evidence of worsening neuropathy.  Will proceed with chemotherapy plan as scheduled. - 11/30/2024 -she presents for cycle 2 day 1 chemotherapy gemcitabine  and Abraxane .  Has had considerable increase in constipation causing epigastric tenderness and  further decrease of appetite.  Overall, labs and presentation are appropriate for treatment and will proceed as scheduled.   Constipation This has been going on for approximately last week.  Started taking Colace as MiraLAX has been ineffective.  Starting to cause some epigastric discomfort.  Also resulting in decreased appetite.  Feels like she has something stuck in her upper abdomen.  This has been present since constipation.  We discussed taking magnesium citrate and/or adding fleets enema to relieve constipation and improve epigastric discomfort.  Weight loss She has had a 5 pound weight loss since last cycle.  She states since constipation started, she is having decreased appetite.  Still trying to eat enough calories and protein.  Sensation that there is something stuck in her abdomen making it difficult to increase calorie intake.  Anemia Mild and stable anemia with Hgb 11.8 and HCT 35.9.  No requirement for blood transfusion or iron infusion.  Will continue to watch with each visit for chemotherapy.  Will treat as indicated.  PICC line difficulties Problems getting patient PICC line to flush and pull back blood for labs today.  She was sent for IR dye study to evaluate placement and for possible occlusion of PICC line.  We discussed possibility of needing chemotherapy via peripheral line today.  We discussed placement of new Port-A-Cath if PICC line is occluded.  She is agreeable to new port if needed.  Per IR study results, PICC line was able to be flushed after aggressive manipulation.  They also were able to get blood return without difficulty.  She was able to proceed with chemotherapy via PICC line.  Will continue with PICC line maintenance weekly.  Plan Labs reviewed. -Mild and stable anemia. -Hyponatremia with sodium of 131.  Encouraged increased hydration.  Will monitor  closely. -CA 19-9 resulted after follow-up visit.  Moderate increase, now at 6, 559.  Discussed using magnesium  citrate and/or fleets enema to help relieve constipation thus, improving abdominal discomfort. Labs and patient presentation are appropriate for chemotherapy gemcitabine  and Abraxane . Proceed with cycle 2 day 1 chemo therapy. Continue with PICC line maintenance as scheduled. Labs, follow-up, and chemotherapy treatment as scheduled.  The patient understands the plans discussed today and is in agreement with them.  She knows to contact our office if she develops concerns prior to her next appointment.  I provided 30 minutes of face-to-face time during this encounter and > 50% was spent counseling as documented under my assessment and plan.    Powell FORBES Lessen, NP  [facility] - A DEPT OF [facility] [address] [place] [address] Dept: [phone] Dept Fax: [phone]   No orders of the defined types were placed in this encounter.     CHIEF COMPLAINT:  CC: pancreatic cancer   Current Treatment:  gemcitabine  and abraxane  every 14 days (started 11/02/2024)  INTERVAL HISTORY:  Ashley Clark is here today for repeat clinical assessment. She last saw me on 11/16/2024.  Today, she reports abdominal discomfort, mostly in mid epigastric area.  Feels like something is stuck in the upper abdomen.  She believes this is related to constipation.  Has been going on for approximately past week.  MiraLAX has been ineffective.  Started taking Colace yesterday.  After discussion, she will try magnesium citrate.  If still ineffective, will use fleets enema.  She states appetite has been decreased over the last week due to epigastric tenderness and constipation.  Trying to consume enough calories but difficult with discomfort in her belly.  Denies nausea and vomiting.  Denies any increase in neuropathy.  Reports increased fatigue over past week.  She denies chest pain, chest pressure, or shortness of breath.  She denies headaches or visual  changes.  She denies fevers or chills.  Her weight has decreased 5 pounds over last 2 weeks.  I have reviewed the past medical history, past surgical history, social history and family history with the patient and they are unchanged from previous note.  ALLERGIES:  is allergic to irinotecan  liposome.  MEDICATIONS:  Current Outpatient Medications  Medication Sig Dispense Refill   apixaban  (ELIQUIS ) 5 MG TABS tablet Take 1 tablet (5 mg total) by mouth 2 (two) times daily. 60 tablet 5   cetirizine (ZYRTEC) 10 MG chewable tablet Chew 10 mg by mouth daily.     metoCLOPramide  (REGLAN ) 10 MG tablet Take 1 tablet (10 mg total) by mouth every 8 (eight) hours as needed for nausea. 20 tablet 1   ondansetron  (ZOFRAN ) 8 MG tablet Take 1 tablet (8 mg total) by mouth every 8 (eight) hours as needed for nausea or vomiting. 30 tablet 1   potassium chloride  SA (KLOR-CON  M) 20 MEQ tablet Take 1 tablet (20 mEq total) by mouth 2 (two) times daily. 6 tablet 0   No current facility-administered medications for this visit.    HISTORY OF PRESENT ILLNESS:   Oncology History  Malignant neoplasm of overlapping sites of left breast in female, estrogen receptor positive (HCC)  08/04/2018 Initial Diagnosis   Screening mammogram detected left breast mass at 9:30 position 1.2 x 1.2 x 1.1 cm.  Closer to the nipple at 9:30 position 0.8 cm lesion span 3.6 cm and a 1.5 cm apart.  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 position 2 cm from nipple, at 1 cm from nipple PASH, lymph node benign, ER 90%, PR 95%, Ki-67 20%, HER-2 negative by IHC 1+, T1CN0 stage Ia AJCC 8   09/29/2018 Surgery   Left lumpectomy: Grade 3 IDC, 1.7 cm, margins negative, lymphovascular invasion present, 0/2 lymph nodes negative, T1c N0 stage Ia   10/06/2018 Cancer Staging   Staging form: Breast, AJCC 8th Edition - Pathologic: Stage IA (pT1c, pN0(sn), cM0, G3, ER+, PR+, HER2-) -  Signed by [name], MD on 10/06/2018   10/22/2018 Oncotype testing   Oncotype score 13: Distant recurrence at 9 years with hormone therapy alone 4%   11/12/2018 - 12/27/2018 Radiation Therapy   Adjuvant radiation therapy   12/27/2018 -  Anti-estrogen oral therapy   Antiestrogen therapy with letrozole  2.5 mg daily   11/30/2023 Genetic Testing   Negative genetic testing on the CancerNext-Expanded+RNAinsight panel.  VUS identified in KIT c.2083G>C and PMS2 c.-1C>A.  The report date is 11/30/2023.  The CancerNext-Expanded gene panel offered by [facility] and includes sequencing, rearrangement, and RNA analysis for the following 76 genes: AIP, ALK, APC, ATM, AXIN2, BAP1, BARD1, BMPR1A, BRCA1, BRCA2, BRIP1, CDC73, CDH1, CDK4, CDKN1B, CDKN2A, CEBPA, CHEK2, CTNNA1, DDX41, DICER1, ETV6, FH, FLCN, GATA2, LZTR1, MAX, MBD4, MEN1, MET, MLH1, MSH2, MSH3, MSH6, MUTYH, NF1, NF2, NTHL1, PALB2, PHOX2B, PMS2, POT1, PRKAR1A, PTCH1, PTEN, RAD51C, RAD51D, RB1, RET, RUNX1, SDHA, SDHAF2, SDHB, SDHC, SDHD, SMAD4, SMARCA4, SMARCB1, SMARCE1, STK11, SUFU, TMEM127, TP53, TSC1, TSC2, VHL, and WT1 (sequencing and deletion/duplication); EGFR, HOXB13, KIT, MITF, PDGFRA, POLD1, and POLE (sequencing only); EPCAM and GREM1 (deletion/duplication only).    Pancreatic cancer (HCC)  11/03/2023 Initial Diagnosis   Pancreatic cancer (HCC)   11/11/2023 - 10/14/2024 Chemotherapy   Patient is on Treatment Plan : PANCREAS NALIRIFOX D1, 15 Q28D     11/30/2023 Genetic Testing   Negative genetic testing on the CancerNext-Expanded+RNAinsight panel.  VUS identified in KIT c.2083G>C and PMS2 c.-1C>A.  The report date is 11/30/2023.  The CancerNext-Expanded gene panel offered by [facility] and includes sequencing, rearrangement, and RNA analysis for the following 76 genes: AIP, ALK, APC, ATM, AXIN2, BAP1, BARD1, BMPR1A, BRCA1, BRCA2, BRIP1, CDC73, CDH1, CDK4, CDKN1B, CDKN2A, CEBPA, CHEK2, CTNNA1, DDX41, DICER1, ETV6, FH, FLCN, GATA2,  LZTR1, MAX, MBD4, MEN1, MET, MLH1, MSH2, MSH3, MSH6, MUTYH, NF1, NF2, NTHL1, PALB2, PHOX2B, PMS2, POT1, PRKAR1A, PTCH1, PTEN, RAD51C, RAD51D, RB1, RET, RUNX1, SDHA, SDHAF2, SDHB, SDHC, SDHD, SMAD4, SMARCA4, SMARCB1, SMARCE1, STK11, SUFU, TMEM127, TP53, TSC1, TSC2, VHL, and WT1 (sequencing and deletion/duplication); EGFR, HOXB13, KIT, MITF, PDGFRA, POLD1, and POLE (sequencing only); EPCAM and GREM1 (deletion/duplication only).    01/29/2024 Imaging   CT abdomen and pelvis with contrast  IMPRESSION: 1. Diminished size of a hypodense mass of the pancreatic neck, consistent with treatment response. 2. Diminished size and hypodensity of numerous liver metastases, with new overlying pseudocirrhotic capsular retraction, consistent with treatment response. 3. 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 4. Unchanged prominent, nonspecific retroperitoneal lymph nodes.  5. Underlying hepatic steatosis.   11/02/2024 -  Chemotherapy   Patient is on Treatment Plan : PANCREATIC Abraxane  D1,8,15 + Gemcitabine  D1,8,15 q28d         REVIEW OF SYSTEMS:   Constitutional: Denies fevers or chills. Has had decreased appetite along with 5 pound weight loss over past 2 weeks.  Eyes: Denies blurriness of vision Ears, nose,  mouth, throat, and face: Denies mucositis or sore throat Respiratory: Denies cough, dyspnea or wheezes Cardiovascular: Denies palpitation, chest discomfort or lower extremity swelling Gastrointestinal:  Denies nausea or heartburn. She is having  increased constipation causing epigastric tenderness.  Skin: Denies abnormal skin rashes Lymphatics: Denies new lymphadenopathy or easy bruising Neurological:Denies numbness, tingling or new weaknesses Behavioral/Psych: Mood is stable, no new changes  All other systems were reviewed with the patient and are negative.   VITALS:   Today's Vitals   11/30/24 0927 11/30/24 0928  BP: (!) 118/52   Pulse: (!) 105   Resp: 17   Temp:  98.9 F (37.2 C)   SpO2: 95%   Weight: 153 lb 6.4 oz (69.6 kg)   PainSc:  0-No pain   Body mass index is 28.52 kg/m.   Wt Readings from Last 3 Encounters:  11/30/24 153 lb 6.4 oz (69.6 kg)  11/16/24 158 lb 8 oz (71.9 kg)  11/16/24 158 lb 12.8 oz (72 kg)    Body mass index is 28.52 kg/m.  Performance status (ECOG): 2 - Symptomatic, <50% confined to bed  PHYSICAL EXAM:   GENERAL:alert, no distress and comfortable SKIN: skin color, texture, turgor are normal, no rashes or significant lesions EYES: normal, Conjunctiva are pink and non-injected, sclera clear OROPHARYNX:no exudate, no erythema and lips, buccal mucosa, and tongue normal  NECK: supple, thyroid normal size, non-tender, without nodularity LYMPH:  no palpable lymphadenopathy in the cervical, axillary or inguinal LUNGS: clear to auscultation and percussion with normal breathing effort HEART: regular rate & rhythm and no murmurs and no lower extremity edema ABDOMEN:abdomen soft with normal bowel sounds. She does have tenderness over RUQ and midepigastric area of the abdomen. There is slight firmness on palpation of the RUQ of the abdomen.  Musculoskeletal:no cyanosis of digits and no clubbing  NEURO: alert & oriented x 3 with fluent speech, no focal motor/sensory deficits  LABORATORY DATA:  I have reviewed the data as listed    Component Value Date/Time   NA 131 (L) 11/30/2024 0859   NA 136 06/10/2022 1152   K 4.1 11/30/2024 0859   CL 93 (L) 11/30/2024 0859   CO2 25 11/30/2024 0859   GLUCOSE 109 (H) 11/30/2024 0859   BUN 6 (L) 11/30/2024 0859   BUN 7 (L) 06/10/2022 1152   CREATININE 0.51 11/30/2024 0859   CALCIUM  9.1 11/30/2024 0859   PROT 7.1 11/30/2024 0859   PROT 7.6 06/10/2022 1152   ALBUMIN 4.0 11/30/2024 0859   ALBUMIN 4.6 06/10/2022 1152   AST 33 11/30/2024 0859   ALT 19 11/30/2024 0859   ALKPHOS 220 (H) 11/30/2024 0859   BILITOT 0.7 11/30/2024 0859   GFRNONAA >60 11/30/2024 0859    Lab Results   Component Value Date   WBC 7.0 11/30/2024   NEUTROABS 4.9 11/30/2024   HGB 11.8 (L) 11/30/2024   HCT 35.9 (L) 11/30/2024   MCV 83.7 11/30/2024   PLT 376 11/30/2024      RADIOGRAPHIC STUDIES: IR CV Line Injection Result Date: 11/30/2024 INDICATION: 63 year old female with left extremity PICC line which is currently malfunctioning. EXAM: IR NON-TUNNELED CENTRAL VENOUS CATH PLC W IMG MEDICATIONS: None ANESTHESIA/SEDATION: None FLUOROSCOPY TIME:  Radiation exposure index: Less than 1 mGy, air kerma COMPLICATIONS: None immediate. PROCEDURE: And initial spot image was obtained demonstrating left extremity PICC with the catheter tip well positioned at the superior cavoatrial junction. The catheter was vigorously flushed and found to flush and aspirate easily. Therefore patient back to the cancer  center to complete her treatment. IMPRESSION: Well position left upper extremity PICC. After aggressive flushing, the catheter was found to flush and aspirate easily. Electronically Signed   By: Wilkie Lent M.D.   On: 11/30/2024 10:40

## 2024-11-30 ENCOUNTER — Inpatient Hospital Stay

## 2024-11-30 ENCOUNTER — Inpatient Hospital Stay: Attending: Hematology | Admitting: Nurse Practitioner

## 2024-11-30 ENCOUNTER — Encounter

## 2024-11-30 ENCOUNTER — Other Ambulatory Visit (HOSPITAL_COMMUNITY): Payer: Self-pay | Admitting: Hematology and Oncology

## 2024-11-30 ENCOUNTER — Ambulatory Visit

## 2024-11-30 ENCOUNTER — Ambulatory Visit: Admitting: Hematology

## 2024-11-30 ENCOUNTER — Ambulatory Visit (HOSPITAL_COMMUNITY)
Admission: RE | Admit: 2024-11-30 | Discharge: 2024-11-30 | Disposition: A | Source: Ambulatory Visit | Attending: Nurse Practitioner

## 2024-11-30 ENCOUNTER — Other Ambulatory Visit: Payer: Self-pay | Admitting: Nurse Practitioner

## 2024-11-30 VITALS — BP 102/67 | HR 94 | Temp 99.1°F | Resp 16

## 2024-11-30 VITALS — BP 118/52 | HR 105 | Temp 98.9°F | Resp 17 | Wt 153.4 lb

## 2024-11-30 DIAGNOSIS — Z79633 Long term (current) use of mitotic inhibitor: Secondary | ICD-10-CM | POA: Diagnosis not present

## 2024-11-30 DIAGNOSIS — C787 Secondary malignant neoplasm of liver and intrahepatic bile duct: Secondary | ICD-10-CM | POA: Insufficient documentation

## 2024-11-30 DIAGNOSIS — C251 Malignant neoplasm of body of pancreas: Secondary | ICD-10-CM

## 2024-11-30 DIAGNOSIS — Z5111 Encounter for antineoplastic chemotherapy: Secondary | ICD-10-CM | POA: Diagnosis present

## 2024-11-30 DIAGNOSIS — Z452 Encounter for adjustment and management of vascular access device: Secondary | ICD-10-CM | POA: Diagnosis present

## 2024-11-30 DIAGNOSIS — T82898A Other specified complication of vascular prosthetic devices, implants and grafts, initial encounter: Secondary | ICD-10-CM | POA: Insufficient documentation

## 2024-11-30 DIAGNOSIS — Z170 Estrogen receptor positive status [ER+]: Secondary | ICD-10-CM

## 2024-11-30 DIAGNOSIS — Z79631 Long term (current) use of antimetabolite agent: Secondary | ICD-10-CM | POA: Diagnosis not present

## 2024-11-30 DIAGNOSIS — Y848 Other medical procedures as the cause of abnormal reaction of the patient, or of later complication, without mention of misadventure at the time of the procedure: Secondary | ICD-10-CM | POA: Diagnosis not present

## 2024-11-30 DIAGNOSIS — C259 Malignant neoplasm of pancreas, unspecified: Secondary | ICD-10-CM

## 2024-11-30 HISTORY — PX: IR CV LINE INJECTION: IMG2294

## 2024-11-30 LAB — CBC WITH DIFFERENTIAL (CANCER CENTER ONLY)
Abs Immature Granulocytes: 0.04 K/uL (ref 0.00–0.07)
Basophils Absolute: 0 K/uL (ref 0.0–0.1)
Basophils Relative: 0 %
Eosinophils Absolute: 0.1 K/uL (ref 0.0–0.5)
Eosinophils Relative: 1 %
HCT: 35.9 % — ABNORMAL LOW (ref 36.0–46.0)
Hemoglobin: 11.8 g/dL — ABNORMAL LOW (ref 12.0–15.0)
Immature Granulocytes: 1 %
Lymphocytes Relative: 12 %
Lymphs Abs: 0.8 K/uL (ref 0.7–4.0)
MCH: 27.5 pg (ref 26.0–34.0)
MCHC: 32.9 g/dL (ref 30.0–36.0)
MCV: 83.7 fL (ref 80.0–100.0)
Monocytes Absolute: 1.1 K/uL — ABNORMAL HIGH (ref 0.1–1.0)
Monocytes Relative: 16 %
Neutro Abs: 4.9 K/uL (ref 1.7–7.7)
Neutrophils Relative %: 70 %
Platelet Count: 376 K/uL (ref 150–400)
RBC: 4.29 MIL/uL (ref 3.87–5.11)
RDW: 13.9 % (ref 11.5–15.5)
WBC Count: 7 K/uL (ref 4.0–10.5)
nRBC: 0 % (ref 0.0–0.2)

## 2024-11-30 LAB — CMP (CANCER CENTER ONLY)
ALT: 19 U/L (ref 0–44)
AST: 33 U/L (ref 15–41)
Albumin: 4 g/dL (ref 3.5–5.0)
Alkaline Phosphatase: 220 U/L — ABNORMAL HIGH (ref 38–126)
Anion gap: 13 (ref 5–15)
BUN: 6 mg/dL — ABNORMAL LOW (ref 8–23)
CO2: 25 mmol/L (ref 22–32)
Calcium: 9.1 mg/dL (ref 8.9–10.3)
Chloride: 93 mmol/L — ABNORMAL LOW (ref 98–111)
Creatinine: 0.51 mg/dL (ref 0.44–1.00)
GFR, Estimated: >60 mL/min (ref >60)
Glucose, Bld: 109 mg/dL — ABNORMAL HIGH (ref 70–99)
Potassium: 4.1 mmol/L (ref 3.5–5.1)
Sodium: 131 mmol/L — ABNORMAL LOW (ref 135–145)
Total Bilirubin: 0.7 mg/dL (ref 0.0–1.2)
Total Protein: 7.1 g/dL (ref 6.5–8.1)

## 2024-11-30 MED ORDER — SODIUM CHLORIDE 0.9 % IV SOLN: INTRAVENOUS | Status: DC

## 2024-11-30 MED ORDER — SODIUM CHLORIDE 0.9% FLUSH
10.0000 mL | INTRAVENOUS | Status: DC | PRN
  Administered 2024-11-30: 10 mL

## 2024-11-30 MED ORDER — HEPARIN SOD (PORK) LOCK FLUSH 100 UNIT/ML IV SOLN
500.0000 [IU] | Freq: Once | INTRAVENOUS | Status: AC | PRN
  Administered 2024-11-30: 500 [IU]

## 2024-11-30 MED ORDER — PACLITAXEL PROTEIN-BOUND CHEMO INJECTION 100 MG
125.0000 mg/m2 | Freq: Once | INTRAVENOUS | Status: AC
  Administered 2024-11-30: 225 mg via INTRAVENOUS
  Filled 2024-11-30: qty 45

## 2024-11-30 MED ORDER — PROCHLORPERAZINE MALEATE 10 MG PO TABS
10.0000 mg | ORAL_TABLET | Freq: Once | ORAL | Status: AC
  Administered 2024-11-30: 10 mg via ORAL
  Filled 2024-11-30: qty 1

## 2024-11-30 MED ORDER — SODIUM CHLORIDE 0.9 % IV SOLN
1000.0000 mg/m2 | Freq: Once | INTRAVENOUS | Status: AC
  Administered 2024-11-30: 1824 mg via INTRAVENOUS
  Filled 2024-11-30: qty 47.97

## 2024-11-30 NOTE — Patient Instructions (Signed)
 [facility] - A DEPT OF . [facility]  Discharge Instructions: Thank you for choosing [facility] to provide your oncology and hematology care.   If you have a lab appointment with the Cancer Center, please go directly to the Cancer Center and check in at the registration area.   Wear comfortable clothing and clothing appropriate for easy access to any Portacath or PICC line.   We strive to give you quality time with your provider. You may need to reschedule your appointment if you arrive late (15 or more minutes).  Arriving late affects you and other patients whose appointments are after yours.  Also, if you miss three or more appointments without notifying the office, you may be dismissed from the clinic at the provider's discretion.      For prescription refill requests, have your pharmacy contact our office and allow 72 hours for refills to be completed.    Today you received the following chemotherapy and/or immunotherapy agents:Paclitaxel-protein bound (Abraxane), Gemcitabine (Gemzar)   To help prevent nausea and vomiting after your treatment, we encourage you to take your nausea medication as directed.  BELOW ARE SYMPTOMS THAT SHOULD BE REPORTED IMMEDIATELY: *FEVER GREATER THAN 100.4 F (38 C) OR HIGHER *CHILLS OR SWEATING *NAUSEA AND VOMITING THAT IS NOT CONTROLLED WITH YOUR NAUSEA MEDICATION *UNUSUAL SHORTNESS OF BREATH *UNUSUAL BRUISING OR BLEEDING *URINARY PROBLEMS (pain or burning when urinating, or frequent urination) *BOWEL PROBLEMS (unusual diarrhea, constipation, pain near the anus) TENDERNESS IN MOUTH AND THROAT WITH OR WITHOUT PRESENCE OF ULCERS (sore throat, sores in mouth, or a toothache) UNUSUAL RASH, SWELLING OR PAIN  UNUSUAL VAGINAL DISCHARGE OR ITCHING   Items with * indicate a potential emergency and should be followed up as soon as possible or go to the Emergency Department if any problems should occur.  Please show  the CHEMOTHERAPY ALERT CARD or IMMUNOTHERAPY ALERT CARD at check-in to the Emergency Department and triage nurse.  Should you have questions after your visit or need to cancel or reschedule your appointment, please contact [facility] - A DEPT OF [facility]  Dept: [phone]  and follow the prompts.  Office hours are 8:00 a.m. to 4:30 p.m. Monday - Friday. Please note that voicemails left after 4:00 p.m. may not be returned until the following business day.  We are closed weekends and major holidays. You have access to a nurse at all times for urgent questions. Please call the main number to the clinic Dept: [phone] and follow the prompts.   For any non-urgent questions, you may also contact your provider using MyChart. We now offer e-Visits for anyone 53 and older to request care online for non-urgent symptoms. For details visit [email or web address].   Also download the MyChart app! Go to the app store, search MyChart, open the app, select [facility], and log in with your MyChart username and password.

## 2024-12-01 LAB — CANCER ANTIGEN 19-9: CA 19-9: 6559 U/mL — ABNORMAL HIGH (ref 0–35)

## 2024-12-02 ENCOUNTER — Inpatient Hospital Stay

## 2024-12-04 ENCOUNTER — Encounter: Payer: Self-pay | Admitting: Hematology

## 2024-12-04 ENCOUNTER — Encounter: Payer: Self-pay | Admitting: Nurse Practitioner

## 2024-12-07 ENCOUNTER — Inpatient Hospital Stay

## 2024-12-07 ENCOUNTER — Encounter

## 2024-12-09 ENCOUNTER — Other Ambulatory Visit (HOSPITAL_COMMUNITY): Payer: Self-pay

## 2024-12-09 ENCOUNTER — Encounter: Payer: Self-pay | Admitting: Hematology

## 2024-12-13 NOTE — Assessment & Plan Note (Signed)
-  stage IV with liver mets, MSS, KRAS G12R mutation (+) -diagnosed in 10/2023 -Patient has a history of breast cancer. She presents with dyspnea, chest discomfort, decreased appetite, and weight loss. She also reports new onset back pain. -CT showed a 2.9cm mass in pancreatic neck/body, and multiple liver mets, liver biopsy confirmed adenocarcinoma  -I reviewed the aggressive nature of pancreatic cancer, and the incurable nature of her disease due to diffuse liver metastasis. -she start first line chemo Nalirifox chemotherapy regimen on 11/12, with dose reduction for first cycle due to poor PS -Performed molecular testing on tumor tissue to identify potential targeted therapy options, unfortunately no actionable mutations detected  -She underwent genetic testing and came back negative  -restaging CT 04/20/2024 showed mild improvement, chemo changed to 5-fu and liposomal irinotecan  in April 2025 -CT 07/28/2024 showed overall stable disease, but unfortunately her cancer progressed in liver on CT 10/19/2024 -chemo changed to gemcitabine  and abraxane  on 11/02/2024

## 2024-12-14 ENCOUNTER — Inpatient Hospital Stay: Admitting: Hematology

## 2024-12-14 ENCOUNTER — Inpatient Hospital Stay

## 2024-12-14 ENCOUNTER — Inpatient Hospital Stay: Admitting: Dietician

## 2024-12-14 VITALS — BP 96/65 | HR 92 | Temp 98.2°F | Resp 15 | Ht 61.5 in | Wt 149.7 lb

## 2024-12-14 DIAGNOSIS — C251 Malignant neoplasm of body of pancreas: Secondary | ICD-10-CM | POA: Diagnosis not present

## 2024-12-14 DIAGNOSIS — E860 Dehydration: Secondary | ICD-10-CM

## 2024-12-14 DIAGNOSIS — E876 Hypokalemia: Secondary | ICD-10-CM

## 2024-12-14 DIAGNOSIS — Z5111 Encounter for antineoplastic chemotherapy: Secondary | ICD-10-CM | POA: Diagnosis not present

## 2024-12-14 LAB — CMP (CANCER CENTER ONLY)
ALT: 36 U/L (ref 0–44)
AST: 72 U/L — ABNORMAL HIGH (ref 15–41)
Albumin: 3.5 g/dL (ref 3.5–5.0)
Alkaline Phosphatase: 232 U/L — ABNORMAL HIGH (ref 38–126)
Anion gap: 10 (ref 5–15)
BUN: 5 mg/dL — ABNORMAL LOW (ref 8–23)
CO2: 26 mmol/L (ref 22–32)
Calcium: 9.1 mg/dL (ref 8.9–10.3)
Chloride: 93 mmol/L — ABNORMAL LOW (ref 98–111)
Creatinine: 0.49 mg/dL (ref 0.44–1.00)
GFR, Estimated: >60 mL/min (ref >60)
Glucose, Bld: 121 mg/dL — ABNORMAL HIGH (ref 70–99)
Potassium: 4.1 mmol/L (ref 3.5–5.1)
Sodium: 129 mmol/L — ABNORMAL LOW (ref 135–145)
Total Bilirubin: 0.5 mg/dL (ref 0.0–1.2)
Total Protein: 6.8 g/dL (ref 6.5–8.1)

## 2024-12-14 LAB — CBC WITH DIFFERENTIAL (CANCER CENTER ONLY)
Abs Immature Granulocytes: 0.05 K/uL (ref 0.00–0.07)
Basophils Absolute: 0 K/uL (ref 0.0–0.1)
Basophils Relative: 0 %
Eosinophils Absolute: 0.1 K/uL (ref 0.0–0.5)
Eosinophils Relative: 1 %
HCT: 31.1 % — ABNORMAL LOW (ref 36.0–46.0)
Hemoglobin: 10.4 g/dL — ABNORMAL LOW (ref 12.0–15.0)
Immature Granulocytes: 1 %
Lymphocytes Relative: 12 %
Lymphs Abs: 1 K/uL (ref 0.7–4.0)
MCH: 26.6 pg (ref 26.0–34.0)
MCHC: 33.4 g/dL (ref 30.0–36.0)
MCV: 79.5 fL — ABNORMAL LOW (ref 80.0–100.0)
Monocytes Absolute: 1.5 K/uL — ABNORMAL HIGH (ref 0.1–1.0)
Monocytes Relative: 17 %
Neutro Abs: 5.9 K/uL (ref 1.7–7.7)
Neutrophils Relative %: 69 %
Platelet Count: 369 K/uL (ref 150–400)
RBC: 3.91 MIL/uL (ref 3.87–5.11)
RDW: 14.1 % (ref 11.5–15.5)
WBC Count: 8.5 K/uL (ref 4.0–10.5)
nRBC: 0 % (ref 0.0–0.2)

## 2024-12-14 MED ORDER — SODIUM CHLORIDE 0.9 % IV SOLN: INTRAVENOUS | Status: DC

## 2024-12-14 MED ORDER — POTASSIUM CHLORIDE CRYS ER 20 MEQ PO TBCR: 20.0000 meq | EXTENDED_RELEASE_TABLET | Freq: Once | ORAL | Status: DC

## 2024-12-14 MED ORDER — HEPARIN SOD (PORK) LOCK FLUSH 100 UNIT/ML IV SOLN
250.0000 [IU] | Freq: Once | INTRAVENOUS | Status: AC | PRN
  Administered 2024-12-14: 13:00:00 250 [IU]

## 2024-12-14 MED ORDER — PROCHLORPERAZINE MALEATE 10 MG PO TABS
10.0000 mg | ORAL_TABLET | Freq: Once | ORAL | Status: AC
  Administered 2024-12-14: 10:00:00 10 mg via ORAL
  Filled 2024-12-14: qty 1

## 2024-12-14 MED ORDER — PACLITAXEL PROTEIN-BOUND CHEMO INJECTION 100 MG
175.0000 mg | Freq: Once | INTRAVENOUS | Status: AC
  Administered 2024-12-14: 11:00:00 175 mg via INTRAVENOUS
  Filled 2024-12-14: qty 35

## 2024-12-14 MED ORDER — SODIUM CHLORIDE 0.9 % IV SOLN
800.0000 mg/m2 | Freq: Once | INTRAVENOUS | Status: AC
  Administered 2024-12-14: 12:00:00 1444 mg via INTRAVENOUS
  Filled 2024-12-14: qty 37.98

## 2024-12-14 NOTE — Progress Notes (Signed)
 [facility]   [phone] [phone]   Clinic Follow up Note   Patient Care Team: [name], [name] as PCP - General (Nurse Practitioner) [name], MD as Consulting Physician (Hematology and Oncology) [name], MD as Consulting Physician (Radiation Oncology) [name], MD as Consulting Physician (General Surgery) [name], MD as Consulting Physician (Hematology and Oncology)  Date of Service:  12/14/2024  CHIEF COMPLAINT: f/u of pancreatic cancer  CURRENT THERAPY:  Gemcitabine  and Abraxane  every 2 weeks  Oncology History   Pancreatic cancer (HCC) -stage IV with liver mets, MSS, KRAS G12R mutation (+) -diagnosed in 10/2023 -Patient has a history of breast cancer. She presents with dyspnea, chest discomfort, decreased appetite, and weight loss. She also reports new onset back pain. -CT showed a 2.9cm mass in pancreatic neck/body, and multiple liver mets, liver biopsy confirmed adenocarcinoma  -I reviewed the aggressive nature of pancreatic cancer, and the incurable nature of her disease due to diffuse liver metastasis. -she start first line chemo Nalirifox chemotherapy regimen on 11/12, with dose reduction for first cycle due to poor PS -Performed molecular testing on tumor tissue to identify potential targeted therapy options, unfortunately no actionable mutations detected  -She underwent genetic testing and came back negative  -restaging CT 04/20/2024 showed mild improvement, chemo changed to 5-fu and liposomal irinotecan  in April 2025 -CT 07/28/2024 showed overall stable disease, but unfortunately her cancer progressed in liver on CT 10/19/2024 -chemo changed to gemcitabine  and abraxane  on 11/02/2024  Assessment & Plan Malignant neoplasm of body of pancreas Rising CA 19-9 (6,500) and worsening symptoms, including persistent nausea and weight loss, raise concern for disease progression. KRAS mutation is present; no FDA-approved  targeted therapy is available, but clinical trials may be considered. Current symptoms may reflect disease progression or chemotherapy adverse effects. - Ordered CT scan of abdomen and pelvis to evaluate for disease progression. - Reduced chemotherapy dose for current cycle. - Discussed possible switch back to FOLFOX regimen if imaging confirms progression. - Discussed referral for clinical trials targeting KRAS mutation at [facility], [place], or [place]. - Will notify her of CT scan date and results. - Monitored laboratory results, including renal and hepatic function.  Chemotherapy-induced nausea and weight loss She reports persistent, severe nausea and significant weight loss (10 lbs in one month) with poor oral intake. Nausea is likely multifactorial, related to chemotherapy and possible disease progression. Current antiemetics include ondansetron  and prochlorperazine , with variable efficacy. - Recommended administration of antiemetic (ondansetron  or prochlorperazine ) 30 minutes prior to meals to improve oral intake. - Referred to palliative care nurse practitioner for symptom management. - Discussed antiemetic adverse effects: ondansetron  may cause constipation and headache; prochlorperazine  may cause drowsiness. - Monitored weight and nutritional status. - Encouraged small, frequent meals and use of nutritional supplements (e.g., Ensure).  Chemotherapy-induced peripheral neuropathy She experiences mild, well-managed numbness and tingling, without significant interference in daily activities. - Discussed possible return to oxaliplatin -based regimen (FOLFOX) if disease progression is confirmed.  Anemia Mild anemia (hemoglobin 10.4 g/dL) is present without acute symptoms. Leukocyte and platelet counts remain stable. - Monitored hemoglobin and complete blood count. - Awaited additional laboratory results for renal and hepatic function.   Plan - Due to her persistent nausea and anorexia, I  reduced chemo dose today - Due to the clinical suspicion for disease progression, I ordered staging CT chest, abdomen and pelvis to be done in the next week, plan to change chemotherapy to FOLFOX if she has disease progression -  I will call her after CT scan. - Urgent referral to palliative care NP [facility] for symptom management, she will see her next week.  SUMMARY OF ONCOLOGIC HISTORY: Oncology History  Malignant neoplasm of overlapping sites of left breast in female, estrogen receptor positive (HCC)  08/04/2018 Initial Diagnosis   Screening mammogram detected left breast mass at 9:30 position 1.2 x 1.2 x 1.1 cm.  Closer to the nipple at 9:30 position 0.8 cm lesion span 3.6 cm and a 1.5 cm apart.  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 position 2 cm from nipple, at 1 cm from nipple PASH, lymph node benign, ER 90%, PR 95%, Ki-67 20%, HER-2 negative by IHC 1+, T1CN0 stage Ia AJCC 8   09/29/2018 Surgery   Left lumpectomy: Grade 3 IDC, 1.7 cm, margins negative, lymphovascular invasion present, 0/2 lymph nodes negative, T1c N0 stage Ia   10/06/2018 Cancer Staging   Staging form: Breast, AJCC 8th Edition - Pathologic: Stage IA (pT1c, pN0(sn), cM0, G3, ER+, PR+, HER2-) - Signed by [name], MD on 10/06/2018   10/22/2018 Oncotype testing   Oncotype score 13: Distant recurrence at 9 years with hormone therapy alone 4%   11/12/2018 - 12/27/2018 Radiation Therapy   Adjuvant radiation therapy   12/27/2018 -  Anti-estrogen oral therapy   Antiestrogen therapy with letrozole  2.5 mg daily   11/30/2023 Genetic Testing   Negative genetic testing on the CancerNext-Expanded+RNAinsight panel.  VUS identified in KIT c.2083G>C and PMS2 c.-1C>A.  The report date is 11/30/2023.  The CancerNext-Expanded gene panel offered by [facility] and includes sequencing, rearrangement, and RNA analysis for the following 76 genes: AIP, ALK, APC,  ATM, AXIN2, BAP1, BARD1, BMPR1A, BRCA1, BRCA2, BRIP1, CDC73, CDH1, CDK4, CDKN1B, CDKN2A, CEBPA, CHEK2, CTNNA1, DDX41, DICER1, ETV6, FH, FLCN, GATA2, LZTR1, MAX, MBD4, MEN1, MET, MLH1, MSH2, MSH3, MSH6, MUTYH, NF1, NF2, NTHL1, PALB2, PHOX2B, PMS2, POT1, PRKAR1A, PTCH1, PTEN, RAD51C, RAD51D, RB1, RET, RUNX1, SDHA, SDHAF2, SDHB, SDHC, SDHD, SMAD4, SMARCA4, SMARCB1, SMARCE1, STK11, SUFU, TMEM127, TP53, TSC1, TSC2, VHL, and WT1 (sequencing and deletion/duplication); EGFR, HOXB13, KIT, MITF, PDGFRA, POLD1, and POLE (sequencing only); EPCAM and GREM1 (deletion/duplication only).    Pancreatic cancer (HCC)  11/03/2023 Initial Diagnosis   Pancreatic cancer (HCC)   11/11/2023 - 10/14/2024 Chemotherapy   Patient is on Treatment Plan : PANCREAS NALIRIFOX D1, 15 Q28D     11/30/2023 Genetic Testing   Negative genetic testing on the CancerNext-Expanded+RNAinsight panel.  VUS identified in KIT c.2083G>C and PMS2 c.-1C>A.  The report date is 11/30/2023.  The CancerNext-Expanded gene panel offered by [facility] and includes sequencing, rearrangement, and RNA analysis for the following 76 genes: AIP, ALK, APC, ATM, AXIN2, BAP1, BARD1, BMPR1A, BRCA1, BRCA2, BRIP1, CDC73, CDH1, CDK4, CDKN1B, CDKN2A, CEBPA, CHEK2, CTNNA1, DDX41, DICER1, ETV6, FH, FLCN, GATA2, LZTR1, MAX, MBD4, MEN1, MET, MLH1, MSH2, MSH3, MSH6, MUTYH, NF1, NF2, NTHL1, PALB2, PHOX2B, PMS2, POT1, PRKAR1A, PTCH1, PTEN, RAD51C, RAD51D, RB1, RET, RUNX1, SDHA, SDHAF2, SDHB, SDHC, SDHD, SMAD4, SMARCA4, SMARCB1, SMARCE1, STK11, SUFU, TMEM127, TP53, TSC1, TSC2, VHL, and WT1 (sequencing and deletion/duplication); EGFR, HOXB13, KIT, MITF, PDGFRA, POLD1, and POLE (sequencing only); EPCAM and GREM1 (deletion/duplication only).    01/29/2024 Imaging   CT abdomen and pelvis with contrast  IMPRESSION: 1. Diminished size of a hypodense mass of the pancreatic neck, consistent with treatment response. 2. Diminished size and hypodensity of numerous liver metastases, with  new overlying pseudocirrhotic  capsular retraction, consistent with treatment response. 3. 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 4. Unchanged prominent, nonspecific retroperitoneal lymph nodes.  5. Underlying hepatic steatosis.   11/02/2024 -  Chemotherapy   Patient is on Treatment Plan : PANCREATIC Abraxane  D1,8,15 + Gemcitabine  D1,8,15 q28d        Discussed the use of AI scribe software for clinical note transcription with the patient, who gave verbal consent to proceed.  History of Present Illness Ashley Clark is a 63 year old female with metastatic pancreatic adenocarcinoma who presents for follow-up due to persistent nausea and weight loss.  She has had nearly constant severe nausea for about two weeks, starting shortly after Thanksgiving. It markedly limits oral intake; today she tolerated only one egg, half a piece of bacon, and half a carton of Ensure and must remain still to avoid vomiting. She has lost about ten pounds over the past month, now weighing 149 pounds. Nausea is poorly controlled with Compazine  and Zofran , though Zofran  offers partial relief. She has not tried Phenergan  or steroids for nausea. She is aware of potential side effects of her antiemetics, including constipation, headache, and drowsiness.  She had intermittent constipation two weeks ago that improved with Colace and mild constipation yesterday, but overall bowel movements are acceptable, likely due to low intake. She denies abdominal pain.  She denies pain. She has mild, stable chemotherapy-induced peripheral neuropathy with numbness and tingling. She walks more slowly but can complete daily activities such as cooking.  She has a dual-lumen PICC line, with one lumen not functioning. She requests a heparin  block today.  Her CA 19-9 was 6,500 two weeks ago. Tumor profiling shows a KRAS mutation. She previously responded to FOLFOX and progressed on irinotecan .     All other  systems were reviewed with the patient and are negative.  MEDICAL HISTORY:  Past Medical History:  Diagnosis Date   Acute pulmonary embolism (HCC) 10/29/2023   Allergy    Back pain    Cancer (HCC) 2019   left breast cancer-last radiation Dec.2019   Diabetes mellitus without complication [facility])    Medical history non-contributory    Personal history of radiation therapy    Port-A-Cath in place - removed 12-11-2023 due to port-a-cath infection. 11/13/2023   Prediabetes    Tiredness    Vitamin D  deficiency     SURGICAL HISTORY: Past Surgical History:  Procedure Laterality Date   BREAST BIOPSY Left 08/04/2018   x3   BREAST LUMPECTOMY Left    09-29-18   BREAST LUMPECTOMY WITH RADIOACTIVE SEED AND SENTINEL LYMPH NODE BIOPSY Left 09/29/2018   Procedure: LEFT BREAST LUMPECTOMY WITH RADIOACTIVE SEED AND SENTINEL LYMPH NODE BIOPSY;  Surgeon: [name], MD;  Location: [facility];  Service: General;  Laterality: Left;   COLONOSCOPY  2016   IR CV LINE INJECTION  11/30/2024   IR IMAGING GUIDED PORT INSERTION  11/03/2023   IR PATIENT EVAL TECH 0-60 MINS  12/18/2023   IR PATIENT EVAL TECH 0-60 MINS  12/24/2023   IR PATIENT EVAL TECH 0-60 MINS  12/28/2023   IR PATIENT EVAL TECH 0-60 MINS  01/04/2024   IR PATIENT EVAL TECH 0-60 MINS  01/13/2024   IR PATIENT EVAL TECH 0-60 MINS  01/22/2024   IR RADIOLOGIST EVAL & MGMT  12/15/2023   IR REMOVAL TUN ACCESS W/ PORT W/O FL MOD SED  12/11/2023   POLYPECTOMY     WISDOM TOOTH EXTRACTION      I have reviewed  the social history and family history with the patient and they are unchanged from previous note.  ALLERGIES:  is allergic to irinotecan  liposome.  MEDICATIONS:  Current Outpatient Medications  Medication Sig Dispense Refill   apixaban  (ELIQUIS ) 5 MG TABS tablet Take 1 tablet (5 mg total) by mouth 2 (two) times daily. 60 tablet 5   cetirizine (ZYRTEC) 10 MG chewable tablet Chew 10 mg by mouth daily.     metoCLOPramide  (REGLAN )  10 MG tablet Take 1 tablet (10 mg total) by mouth every 8 (eight) hours as needed for nausea. 20 tablet 1   ondansetron  (ZOFRAN ) 8 MG tablet Take 1 tablet (8 mg total) by mouth every 8 (eight) hours as needed for nausea or vomiting. 30 tablet 1   potassium chloride  SA (KLOR-CON  M) 20 MEQ tablet Take 1 tablet (20 mEq total) by mouth 2 (two) times daily. 6 tablet 0   No current facility-administered medications for this visit.   Facility-Administered Medications Ordered in Other Visits  Medication Dose Route Frequency Provider Last Rate Last Admin   0.9 %  sodium chloride  infusion   Intravenous Continuous [name], MD       0.9 %  sodium chloride  infusion   Intravenous Continuous [name], MD 10 mL/hr at 12/14/24 0953 New Bag at 12/14/24 0953   0.9 %  sodium chloride  infusion   Intravenous Continuous [name], MD       gemcitabine  (GEMZAR ) 1,444 mg in sodium chloride  0.9 % 250 mL chemo infusion  800 mg/m2 (Treatment Plan Recorded) Intravenous Once [name], MD       PACLitaxel -protein bound (ABRAXANE ) chemo infusion 175 mg  175 mg Intravenous Once [name], MD       potassium chloride  SA (KLOR-CON  M) CR tablet 20 mEq  20 mEq Oral Once [name], MD        PHYSICAL EXAMINATION: ECOG PERFORMANCE STATUS: 2 - Symptomatic, <50% confined to bed  Vitals:   12/14/24 0913  BP: 96/65  Pulse: 92  Resp: 15  Temp: 98.2 F (36.8 C)  SpO2: 100%   Wt Readings from Last 3 Encounters:  12/14/24 149 lb 11.2 oz (67.9 kg)  11/30/24 153 lb 6.4 oz (69.6 kg)  11/16/24 158 lb 8 oz (71.9 kg)     GENERAL:alert, no distress and comfortable SKIN: skin color, texture, turgor are normal, no rashes or significant lesions EYES: normal, Conjunctiva are pink and non-injected, sclera clear NECK: supple, thyroid normal size, non-tender, without nodularity LYMPH:  no palpable lymphadenopathy in the cervical, axillary  LUNGS: clear to auscultation and percussion with normal breathing effort HEART: regular rate &  rhythm and no murmurs and no lower extremity edema ABDOMEN:abdomen soft, non-tender and normal bowel sounds Musculoskeletal:no cyanosis of digits and no clubbing  NEURO: alert & oriented x 3 with fluent speech, no focal motor/sensory deficits  Physical Exam   LABORATORY DATA:  I have reviewed the data as listed    Latest Ref Rng & Units 12/14/2024    8:46 AM 11/30/2024    8:59 AM 11/16/2024    7:37 AM  CBC  WBC 4.0 - 10.5 K/uL 8.5  7.0  6.5   Hemoglobin 12.0 - 15.0 g/dL 89.5  88.1  88.5   Hematocrit 36.0 - 46.0 % 31.1  35.9  34.4   Platelets 150 - 400 K/uL 369  376  302         Latest Ref Rng & Units 12/14/2024    8:46 AM 11/30/2024  8:59 AM 11/16/2024    7:37 AM  CMP  Glucose 70 - 99 mg/dL 878  890  875   BUN 8 - 23 mg/dL 5  6  <5   Creatinine 9.55 - 1.00 mg/dL 9.50  9.48  9.50   Sodium 135 - 145 mmol/L 129  131  132   Potassium 3.5 - 5.1 mmol/L 4.1  4.1  3.9   Chloride 98 - 111 mmol/L 93  93  96   CO2 22 - 32 mmol/L 26  25  24    Calcium  8.9 - 10.3 mg/dL 9.1  9.1  9.2   Total Protein 6.5 - 8.1 g/dL 6.8  7.1  7.1   Total Bilirubin 0.0 - 1.2 mg/dL 0.5  0.7  0.8   Alkaline Phos 38 - 126 U/L 232  220  192   AST 15 - 41 U/L 72  33  26   ALT 0 - 44 U/L 36  19  12       RADIOGRAPHIC STUDIES: I have personally reviewed the radiological images as listed and agreed with the findings in the report. No results found.    Orders Placed This Encounter  Procedures   CT CHEST ABDOMEN PELVIS W CONTRAST    Standing Status:   Future    Expected Date:   12/16/2024    Expiration Date:   12/14/2025    If indicated for the ordered procedure, I authorize the administration of contrast media per Radiology protocol:   Yes    Does the patient have a contrast media/X-ray dye allergy?:   Yes    Preferred imaging location?:   [place]    If indicated for the ordered procedure, I authorize the administration of oral contrast media per Radiology protocol:   Yes   CBC with  Differential (Cancer Center Only)    Standing Status:   Future    Expected Date:   12/28/2024    Expiration Date:   12/28/2025   CMP (Cancer Center only)    Standing Status:   Future    Expected Date:   12/28/2024    Expiration Date:   12/28/2025   CBC with Differential (Cancer Center Only)    Standing Status:   Future    Expected Date:   01/11/2025    Expiration Date:   01/11/2026   CMP (Cancer Center only)    Standing Status:   Future    Expected Date:   01/11/2025    Expiration Date:   01/11/2026   Amb Referral to Palliative Care    Referral Priority:   Urgent    Referral Type:   Consultation    Number of Visits Requested:   1   All questions were answered. The patient knows to call the clinic with any problems, questions or concerns. No barriers to learning was detected. The total time spent in the appointment was 30 minutes, including review of chart and various tests results, discussions about plan of care and coordination of care plan     Onita Mattock, MD 12/14/2024

## 2024-12-14 NOTE — Patient Instructions (Signed)
 [facility] - A DEPT OF . [facility]  Discharge Instructions: Thank you for choosing [facility] to provide your oncology and hematology care.   If you have a lab appointment with the Cancer Center, please go directly to the Cancer Center and check in at the registration area.   Wear comfortable clothing and clothing appropriate for easy access to any Portacath or PICC line.   We strive to give you quality time with your provider. You may need to reschedule your appointment if you arrive late (15 or more minutes).  Arriving late affects you and other patients whose appointments are after yours.  Also, if you miss three or more appointments without notifying the office, you may be dismissed from the clinic at the provider's discretion.      For prescription refill requests, have your pharmacy contact our office and allow 72 hours for refills to be completed.    Today you received the following chemotherapy and/or immunotherapy agents:Paclitaxel-protein bound (Abraxane), Gemcitabine (Gemzar)   To help prevent nausea and vomiting after your treatment, we encourage you to take your nausea medication as directed.  BELOW ARE SYMPTOMS THAT SHOULD BE REPORTED IMMEDIATELY: *FEVER GREATER THAN 100.4 F (38 C) OR HIGHER *CHILLS OR SWEATING *NAUSEA AND VOMITING THAT IS NOT CONTROLLED WITH YOUR NAUSEA MEDICATION *UNUSUAL SHORTNESS OF BREATH *UNUSUAL BRUISING OR BLEEDING *URINARY PROBLEMS (pain or burning when urinating, or frequent urination) *BOWEL PROBLEMS (unusual diarrhea, constipation, pain near the anus) TENDERNESS IN MOUTH AND THROAT WITH OR WITHOUT PRESENCE OF ULCERS (sore throat, sores in mouth, or a toothache) UNUSUAL RASH, SWELLING OR PAIN  UNUSUAL VAGINAL DISCHARGE OR ITCHING   Items with * indicate a potential emergency and should be followed up as soon as possible or go to the Emergency Department if any problems should occur.  Please show  the CHEMOTHERAPY ALERT CARD or IMMUNOTHERAPY ALERT CARD at check-in to the Emergency Department and triage nurse.  Should you have questions after your visit or need to cancel or reschedule your appointment, please contact [facility] - A DEPT OF [facility]  Dept: [phone]  and follow the prompts.  Office hours are 8:00 a.m. to 4:30 p.m. Monday - Friday. Please note that voicemails left after 4:00 p.m. may not be returned until the following business day.  We are closed weekends and major holidays. You have access to a nurse at all times for urgent questions. Please call the main number to the clinic Dept: [phone] and follow the prompts.   For any non-urgent questions, you may also contact your provider using MyChart. We now offer e-Visits for anyone 53 and older to request care online for non-urgent symptoms. For details visit [email or web address].   Also download the MyChart app! Go to the app store, search MyChart, open the app, select [facility], and log in with your MyChart username and password.

## 2024-12-14 NOTE — Progress Notes (Signed)
 Ok to adjust abraxane  dose to 175mg  d/t wt loss per Dr Lanny

## 2024-12-14 NOTE — Progress Notes (Signed)
 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 Folfirinox discontinued after first cycle due to reaction. Changed to Nalirifox q28d. Oxaliplatin  stopped due to neuropathy. Now with progression to liver. Patient starting gemcitabine /abraxane  q28d 11/5. She is under the care of Dr. Lanny.   Met with patient in infusion. She reports this has been the hardest week out of the 14 months of undergoing treatment. Appetite is poor resulting in increased fatigue. Patient determined to get her pep back. Endorses reflux and poor digestion. Burping up egg that she ate at 5 AM during visit. Patient is nauseous. Compazine  works fairly well. She has not been taking reglan  as it did nothing to help with nausea. She does not take anything for reflux. Constipation has resolved.   Medications: reviewed   Labs: glucose 121, Na 129, BUN 5 (receiving IVF + treatment)   Anthropometrics: Wt 149 lb 11.2 oz today - decreased 2.6% in 2 weeks - severe for time frame  12/3 - 153 lb 6.4 11/19 - 158 lb 12.8 oz 11/5 - 161 lb  10/29 - 165 lb 12.8 oz   NUTRITION DIAGNOSIS: Unintended wt loss continues   INTERVENTION:  Suggested trying reglan  again to aid with digestion. Pt may benefit from daily PPI Encouraged to add salt to foods/daily liquid IV given worsening hyponatremia  Encourage small meals/snacks q2-3h Pt has started drinking Ensure, recommend 2-3/day as tolerated  PCT to see patient during infusion Support and encouragement   MONITORING, EVALUATION, GOAL: wt trends, intake   NEXT VISIT: To be determined with treatment

## 2024-12-15 ENCOUNTER — Telehealth: Payer: Self-pay | Admitting: Hematology

## 2024-12-15 LAB — CANCER ANTIGEN 19-9: CA 19-9: 6396 U/mL — ABNORMAL HIGH (ref 0–35)

## 2024-12-15 NOTE — Telephone Encounter (Signed)
 Called and scheduled PT upcoming PF and TXT, day, time and location confirmed with PT.

## 2024-12-16 ENCOUNTER — Other Ambulatory Visit: Payer: Self-pay

## 2024-12-20 ENCOUNTER — Inpatient Hospital Stay

## 2024-12-20 DIAGNOSIS — C251 Malignant neoplasm of body of pancreas: Secondary | ICD-10-CM

## 2024-12-20 DIAGNOSIS — C259 Malignant neoplasm of pancreas, unspecified: Secondary | ICD-10-CM

## 2024-12-20 DIAGNOSIS — Z5111 Encounter for antineoplastic chemotherapy: Secondary | ICD-10-CM | POA: Diagnosis not present

## 2024-12-20 DIAGNOSIS — C50812 Malignant neoplasm of overlapping sites of left female breast: Secondary | ICD-10-CM

## 2024-12-21 ENCOUNTER — Ambulatory Visit (HOSPITAL_COMMUNITY)
Admission: RE | Admit: 2024-12-21 | Discharge: 2024-12-21 | Disposition: A | Discharge status: Home / Self Care | Source: Ambulatory Visit | Attending: Hematology | Admitting: Hematology

## 2024-12-21 ENCOUNTER — Other Ambulatory Visit: Payer: Self-pay

## 2024-12-21 DIAGNOSIS — C251 Malignant neoplasm of body of pancreas: Secondary | ICD-10-CM | POA: Diagnosis present

## 2024-12-21 MED ORDER — IOHEXOL 300 MG/ML  SOLN
100.0000 mL | Freq: Once | INTRAMUSCULAR | Status: AC | PRN
  Administered 2024-12-21: 100 mL via INTRAVENOUS

## 2024-12-21 MED ORDER — HEPARIN SOD (PORK) LOCK FLUSH 100 UNIT/ML IV SOLN
INTRAVENOUS | Status: AC
  Filled 2024-12-21: qty 5

## 2024-12-22 ENCOUNTER — Other Ambulatory Visit: Payer: Self-pay

## 2024-12-24 ENCOUNTER — Other Ambulatory Visit: Payer: Self-pay

## 2024-12-27 ENCOUNTER — Ambulatory Visit: Payer: Self-pay | Admitting: Nurse Practitioner

## 2024-12-27 ENCOUNTER — Inpatient Hospital Stay

## 2024-12-27 ENCOUNTER — Other Ambulatory Visit: Payer: Self-pay | Admitting: Nurse Practitioner

## 2024-12-27 VITALS — BP 93/67 | HR 100 | Temp 97.9°F | Resp 18

## 2024-12-27 DIAGNOSIS — E876 Hypokalemia: Secondary | ICD-10-CM

## 2024-12-27 DIAGNOSIS — Z5111 Encounter for antineoplastic chemotherapy: Secondary | ICD-10-CM | POA: Diagnosis not present

## 2024-12-27 DIAGNOSIS — C251 Malignant neoplasm of body of pancreas: Secondary | ICD-10-CM

## 2024-12-27 LAB — CMP (CANCER CENTER ONLY)
ALT: 29 U/L (ref 0–44)
AST: 53 U/L — ABNORMAL HIGH (ref 15–41)
Albumin: 3.4 g/dL — ABNORMAL LOW (ref 3.5–5.0)
Alkaline Phosphatase: 237 U/L — ABNORMAL HIGH (ref 38–126)
Anion gap: 18 — ABNORMAL HIGH (ref 5–15)
BUN: 6 mg/dL — ABNORMAL LOW (ref 8–23)
CO2: 22 mmol/L (ref 22–32)
Calcium: 9.4 mg/dL (ref 8.9–10.3)
Chloride: 89 mmol/L — ABNORMAL LOW (ref 98–111)
Creatinine: 0.37 mg/dL — ABNORMAL LOW (ref 0.44–1.00)
GFR, Estimated: >60 mL/min
Glucose, Bld: 108 mg/dL — ABNORMAL HIGH (ref 70–99)
Potassium: 3.3 mmol/L — ABNORMAL LOW (ref 3.5–5.1)
Sodium: 128 mmol/L — ABNORMAL LOW (ref 135–145)
Total Bilirubin: 1.1 mg/dL (ref 0.0–1.2)
Total Protein: 6.9 g/dL (ref 6.5–8.1)

## 2024-12-27 LAB — CBC WITH DIFFERENTIAL (CANCER CENTER ONLY)
Abs Immature Granulocytes: 0.12 K/uL — ABNORMAL HIGH (ref 0.00–0.07)
Basophils Absolute: 0 K/uL (ref 0.0–0.1)
Basophils Relative: 0 %
Eosinophils Absolute: 0.2 K/uL (ref 0.0–0.5)
Eosinophils Relative: 2 %
HCT: 32.8 % — ABNORMAL LOW (ref 36.0–46.0)
Hemoglobin: 10.7 g/dL — ABNORMAL LOW (ref 12.0–15.0)
Immature Granulocytes: 1 %
Lymphocytes Relative: 8 %
Lymphs Abs: 1 K/uL (ref 0.7–4.0)
MCH: 25.5 pg — ABNORMAL LOW (ref 26.0–34.0)
MCHC: 32.6 g/dL (ref 30.0–36.0)
MCV: 78.3 fL — ABNORMAL LOW (ref 80.0–100.0)
Monocytes Absolute: 1.8 K/uL — ABNORMAL HIGH (ref 0.1–1.0)
Monocytes Relative: 14 %
Neutro Abs: 9.4 K/uL — ABNORMAL HIGH (ref 1.7–7.7)
Neutrophils Relative %: 75 %
Platelet Count: 347 K/uL (ref 150–400)
RBC: 4.19 MIL/uL (ref 3.87–5.11)
RDW: 14.9 % (ref 11.5–15.5)
WBC Count: 12.5 K/uL — ABNORMAL HIGH (ref 4.0–10.5)
nRBC: 0 % (ref 0.0–0.2)

## 2024-12-27 MED ORDER — SODIUM CHLORIDE 0.9% FLUSH
10.0000 mL | INTRAVENOUS | Status: DC | PRN
  Administered 2024-12-27: 10 mL

## 2024-12-27 MED ORDER — PROCHLORPERAZINE MALEATE 10 MG PO TABS
10.0000 mg | ORAL_TABLET | Freq: Once | ORAL | Status: AC
  Administered 2024-12-27: 10 mg via ORAL
  Filled 2024-12-27: qty 1

## 2024-12-27 MED ORDER — POTASSIUM CHLORIDE CRYS ER 20 MEQ PO TBCR
20.0000 meq | EXTENDED_RELEASE_TABLET | Freq: Once | ORAL | Status: AC
  Administered 2024-12-27: 20 meq via ORAL
  Filled 2024-12-27: qty 1

## 2024-12-27 MED ORDER — SODIUM CHLORIDE 0.9 % IV SOLN: INTRAVENOUS | Status: DC

## 2024-12-27 MED ORDER — SODIUM CHLORIDE 0.9 % IV SOLN: Freq: Once | INTRAVENOUS | Status: AC

## 2024-12-27 MED ORDER — PACLITAXEL PROTEIN-BOUND CHEMO INJECTION 100 MG
100.0000 mg/m2 | Freq: Once | INTRAVENOUS | Status: AC
  Administered 2024-12-27: 175 mg via INTRAVENOUS
  Filled 2024-12-27: qty 35

## 2024-12-27 MED ORDER — SODIUM CHLORIDE 0.9 % IV SOLN
800.0000 mg/m2 | Freq: Once | INTRAVENOUS | Status: AC
  Administered 2024-12-27: 1444 mg via INTRAVENOUS
  Filled 2024-12-27: qty 26.28

## 2024-12-27 NOTE — Patient Instructions (Signed)
 [facility] - A DEPT OF [place]  Discharge Instructions: Thank you for choosing [facility] to provide your oncology and hematology care.   If you have a lab appointment with the Cancer Center, please go directly to the Cancer Center and check in at the registration area.   Wear comfortable clothing and clothing appropriate for easy access to any Portacath or PICC line.   We strive to give you quality time with your provider. You may need to reschedule your appointment if you arrive late (15 or more minutes).  Arriving late affects you and other patients whose appointments are after yours.  Also, if you miss three or more appointments without notifying the office, you may be dismissed from the clinic at the providers discretion.      For prescription refill requests, have your pharmacy contact our office and allow 72 hours for refills to be completed.    Today you received the following chemotherapy and/or immunotherapy agents:Paclitaxel -protein bound (Abraxane ), Gemcitabine  (Gemzar )   To help prevent nausea and vomiting after your treatment, we encourage you to take your nausea medication as directed.  BELOW ARE SYMPTOMS THAT SHOULD BE REPORTED IMMEDIATELY: *FEVER GREATER THAN 100.4 F (38 C) OR HIGHER *CHILLS OR SWEATING *NAUSEA AND VOMITING THAT IS NOT CONTROLLED WITH YOUR NAUSEA MEDICATION *UNUSUAL SHORTNESS OF BREATH *UNUSUAL BRUISING OR BLEEDING *URINARY PROBLEMS (pain or burning when urinating, or frequent urination) *BOWEL PROBLEMS (unusual diarrhea, constipation, pain near the anus) TENDERNESS IN MOUTH AND THROAT WITH OR WITHOUT PRESENCE OF ULCERS (sore throat, sores in mouth, or a toothache) UNUSUAL RASH, SWELLING OR PAIN  UNUSUAL VAGINAL DISCHARGE OR ITCHING   Items with * indicate a potential emergency and should be followed up as soon as possible or go to the Emergency Department if any problems should occur.  Please show  the CHEMOTHERAPY ALERT CARD or IMMUNOTHERAPY ALERT CARD at check-in to the Emergency Department and triage nurse.  Should you have questions after your visit or need to cancel or reschedule your appointment, please contact [facility] - A DEPT OF [facility]  Dept: [phone]  and follow the prompts.  Office hours are 8:00 a.m. to 4:30 p.m. Monday - Friday. Please note that voicemails left after 4:00 p.m. may not be returned until the following business day.  We are closed weekends and major holidays. You have access to a nurse at all times for urgent questions. Please call the main number to the clinic Dept: [phone] and follow the prompts.   For any non-urgent questions, you may also contact your provider using MyChart. We now offer e-Visits for anyone 68 and older to request care online for non-urgent symptoms. For details visit [email or web address].   Also download the MyChart app! Go to the app store, search MyChart, open the app, select [facility], and log in with your MyChart username and password.

## 2024-12-27 NOTE — Progress Notes (Signed)
 Lacie Burton, NP contacted me vis secure chat at [phone] about pt's labs (K+). She has placed orders for KCL 20meq po now and a 500cc bolus of NS to be co administered with chemo. See MAR. Pt is feeling ok today except for tiredness because she does not rest will the night before chemo.

## 2024-12-27 NOTE — Patient Instructions (Signed)
 PICC Home Care Guide A peripherally inserted central catheter (PICC) is a form of IV access that allows medicines and IV fluids to be quickly put into the blood and spread throughout the body. The PICC is a long, thin, flexible tube (catheter) that is put into a vein in a person's arm or leg. The catheter ends in a large vein just outside the heart called the superior vena cava (SVC). After the PICC is put in, a chest X-ray may be done to make sure that it is in the right place. A PICC may be placed for different reasons, such as: To give medicines and liquid nutrition. To give IV fluids and blood products. To take blood samples often. If there is trouble placing a peripheral intravenous (PIV) catheter. If cared for properly, a PICC can remain in place for many months. Having a PICC can allow you to go home from the hospital sooner and continue treatment at home. Medicines and PICC care can be managed at home by a family member, caregiver, or home health care team. What are the risks? Generally, having a PICC is safe. However, problems may occur, including: A blood clot (thrombus) forming in or at the end of the PICC. A blood clot forming in a vein (deep vein thrombosis) or traveling to the lung (pulmonary embolism). Inflammation of the vein (phlebitis) in which the PICC is placed. Infection at the insertion site or in the blood. Blood infections from central lines, like PICCs, can be serious and often require a hospital stay. PICC malposition, or PICC movement or poor placement. A break or cut in the PICC. Do not use scissors near the PICC. Nerve or tendon irritation or injury during PICC insertion. How to care for your PICC Please follow the specific guidelines provided by your health care provider. Preventing infection You and any caregivers should wash your hands often with soap and water for at least 20 seconds. Wash hands: Before touching the PICC or the infusion device. Before changing a  bandage (dressing). Do not change the dressing unless you have been taught to do so and have shown you are able to change it safely. Flush the PICC as told. Tell your health care provider right away if the PICC is hard to flush or does not flush. Do not use force to flush the PICC. Use clean and germ-free (sterile) supplies only. Keep the supplies in a dry place. Do not reuse needles, syringes, or any other supplies. Reusing supplies can lead to infection. Keep the PICC dressing dry and secure it with tape if the edges stop sticking to your skin. Check your PICC insertion site every day for signs of infection. Check for: Redness, swelling, or pain. Fluid or blood. Warmth. Pus or a bad smell. Preventing other problems Do not use a syringe that is less than 10 mL to flush the PICC. Do not have your blood pressure checked on the arm in which the PICC is placed. Do not ever pull or tug on the PICC. Keep it secured to your arm with tape or a stretch wrap when not in use. Do not take the PICC out yourself. Only a trained health care provider should remove the PICC. Keep pets and children away from your PICC. How to care for your PICC dressing Keep your PICC dressing clean and dry to prevent infection. Do not take baths, swim, or use a hot tub until your health care provider approves. Ask your health care provider if you can take  showers. You may only be allowed to take sponge baths. When you are allowed to shower: Ask your health care provider to teach you how to wrap the PICC. Cover the PICC with clear plastic wrap and tape to keep it dry while showering. Follow instructions from your health care provider about how to take care of your insertion site and dressing. Make sure you: Wash your hands with soap and water for at least 20 seconds before and after you change your dressing. If soap and water are not available, use hand sanitizer. Change your dressing only if taught to do so by your health care  provider. Your PICC dressing needs to be changed if it becomes loose or wet. Leave stitches (sutures), skin glue, or adhesive strips in place. These skin closures may need to stay in place for 2 weeks or longer. If adhesive strip edges start to loosen and curl up, you may trim the loose edges. Do not remove adhesive strips completely unless your health care provider tells you to do that. Follow these instructions at home: Disposal of supplies Throw away any syringes in a disposal container that is meant for sharp items (sharps container). You can buy a sharps container from a pharmacy, or you can make one by using an empty, hard plastic bottle with a lid. Place any used dressings or infusion bags into a plastic bag. Throw that bag in the trash. General instructions  Always carry your PICC identification card or wear a medical alert bracelet. Keep the tube clamped at all times, unless it is being used. Always carry a smooth-edge clamp with you to clamp the PICC if it breaks. Do not use scissors or sharp objects near the tube. You may bend your arm and move it freely. If your PICC is near or at the bend of your elbow, avoid activity with repeated motion at the elbow. Avoid lifting heavy objects as told by your health care provider. Keep all follow-up visits. This is important. You will need to have your PICC dressing changed at least once a week. Contact a health care provider if: You have pain in your arm, ear, face, or teeth. You have a fever or chills. You have redness, swelling, or pain around the insertion site. You have fluid or blood coming from the insertion site. Your insertion site feels warm to the touch. You have pus or a bad smell coming from the insertion site. Your skin feels hard and raised around the insertion site. Your PICC dressing has gotten wet or is coming off and you have not been taught how to change it. Get help right away if: You have problems with your PICC, such as  your PICC: Was tugged or pulled and has partially come out. Do not  push the PICC back in. Cannot be flushed, is hard to flush, or leaks around the insertion site when it is flushed. Makes a flushing sound when it is flushed. Appears to have a hole or tear. Is accidentally pulled all the way out. If this happens, cover the insertion site with a gauze dressing. Do not throw the PICC away. Your health care provider will need to check it to be sure the entire catheter came out. You feel your heart racing or skipping beats, or you have chest pain. You have shortness of breath or trouble breathing. You have swelling, redness, warmth, or pain in the arm in which the PICC is placed. You have a red streak going up your arm that  starts under the PICC dressing. These symptoms may be an emergency. Get help right away. Call 911. Do not wait to see if the symptoms will go away. Do not drive yourself to the hospital. Summary A peripherally inserted central catheter (PICC) is a long, thin, flexible tube (catheter) that is put into a vein in the arm or leg. If cared for properly, a PICC can remain in place for many months. Having a PICC can allow you to go home from the hospital sooner and continue treatment at home. The PICC is inserted using a germ-free (sterile) technique by a specially trained health care provider. Only a trained health care provider should remove it. Do not have your blood pressure checked on the arm in which your PICC is placed. Always keep your PICC identification card with you. This information is not intended to replace advice given to you by your health care provider. Make sure you discuss any questions you have with your health care provider. Document Revised: 07/03/2021 Document Reviewed: 07/03/2021 Elsevier Patient Education  2024 [employer].

## 2024-12-28 ENCOUNTER — Inpatient Hospital Stay: Admitting: Nurse Practitioner

## 2024-12-28 ENCOUNTER — Telehealth: Payer: Self-pay

## 2024-12-28 NOTE — Telephone Encounter (Signed)
 Spoke with pt via telephone regarding pt's appt with Palliative Care today.  Pt stated she was not aware of her appt today with Palliative Care and requested if the appt could be rescheduled.  Stated this nurse will have Palliative Care's scheduler contact her to reschedule the appt.  Pt verbalized understanding and stated she would awake their call.

## 2024-12-30 ENCOUNTER — Other Ambulatory Visit: Payer: Self-pay

## 2025-01-03 ENCOUNTER — Inpatient Hospital Stay: Attending: Hematology

## 2025-01-07 ENCOUNTER — Other Ambulatory Visit: Payer: Self-pay

## 2025-01-07 ENCOUNTER — Inpatient Hospital Stay (HOSPITAL_COMMUNITY)
Admission: EM | Admit: 2025-01-07 | Discharge: 2025-01-15 | DRG: 368 | Disposition: A | Discharge status: Home / Self Care | Attending: Internal Medicine | Admitting: Internal Medicine

## 2025-01-07 ENCOUNTER — Encounter (HOSPITAL_COMMUNITY): Payer: Self-pay | Admitting: Emergency Medicine

## 2025-01-07 DIAGNOSIS — B3781 Candidal esophagitis: Secondary | ICD-10-CM | POA: Diagnosis not present

## 2025-01-07 DIAGNOSIS — Z86711 Personal history of pulmonary embolism: Secondary | ICD-10-CM | POA: Diagnosis not present

## 2025-01-07 DIAGNOSIS — R1310 Dysphagia, unspecified: Secondary | ICD-10-CM

## 2025-01-07 DIAGNOSIS — I10 Essential (primary) hypertension: Secondary | ICD-10-CM | POA: Diagnosis present

## 2025-01-07 DIAGNOSIS — R531 Weakness: Secondary | ICD-10-CM | POA: Diagnosis present

## 2025-01-07 DIAGNOSIS — G893 Neoplasm related pain (acute) (chronic): Secondary | ICD-10-CM | POA: Diagnosis present

## 2025-01-07 DIAGNOSIS — C786 Secondary malignant neoplasm of retroperitoneum and peritoneum: Secondary | ICD-10-CM | POA: Diagnosis present

## 2025-01-07 DIAGNOSIS — F17210 Nicotine dependence, cigarettes, uncomplicated: Secondary | ICD-10-CM | POA: Diagnosis not present

## 2025-01-07 DIAGNOSIS — C787 Secondary malignant neoplasm of liver and intrahepatic bile duct: Secondary | ICD-10-CM | POA: Diagnosis not present

## 2025-01-07 DIAGNOSIS — D689 Coagulation defect, unspecified: Secondary | ICD-10-CM | POA: Diagnosis not present

## 2025-01-07 DIAGNOSIS — Z79899 Other long term (current) drug therapy: Secondary | ICD-10-CM

## 2025-01-07 DIAGNOSIS — G620 Drug-induced polyneuropathy: Secondary | ICD-10-CM | POA: Diagnosis present

## 2025-01-07 DIAGNOSIS — K5900 Constipation, unspecified: Secondary | ICD-10-CM

## 2025-01-07 DIAGNOSIS — K7460 Unspecified cirrhosis of liver: Secondary | ICD-10-CM | POA: Insufficient documentation

## 2025-01-07 DIAGNOSIS — E1141 Type 2 diabetes mellitus with diabetic mononeuropathy: Secondary | ICD-10-CM | POA: Diagnosis not present

## 2025-01-07 DIAGNOSIS — N390 Urinary tract infection, site not specified: Secondary | ICD-10-CM | POA: Diagnosis present

## 2025-01-07 DIAGNOSIS — Z7901 Long term (current) use of anticoagulants: Secondary | ICD-10-CM | POA: Diagnosis not present

## 2025-01-07 DIAGNOSIS — E43 Unspecified severe protein-calorie malnutrition: Secondary | ICD-10-CM | POA: Diagnosis not present

## 2025-01-07 DIAGNOSIS — I272 Pulmonary hypertension, unspecified: Secondary | ICD-10-CM | POA: Diagnosis present

## 2025-01-07 DIAGNOSIS — R64 Cachexia: Secondary | ICD-10-CM | POA: Diagnosis not present

## 2025-01-07 DIAGNOSIS — K222 Esophageal obstruction: Secondary | ICD-10-CM | POA: Diagnosis not present

## 2025-01-07 DIAGNOSIS — D509 Iron deficiency anemia, unspecified: Secondary | ICD-10-CM | POA: Diagnosis present

## 2025-01-07 DIAGNOSIS — Z853 Personal history of malignant neoplasm of breast: Secondary | ICD-10-CM

## 2025-01-07 DIAGNOSIS — Z803 Family history of malignant neoplasm of breast: Secondary | ICD-10-CM

## 2025-01-07 DIAGNOSIS — Z860100 Personal history of colon polyps, unspecified: Secondary | ICD-10-CM

## 2025-01-07 DIAGNOSIS — R1319 Other dysphagia: Secondary | ICD-10-CM

## 2025-01-07 DIAGNOSIS — E876 Hypokalemia: Secondary | ICD-10-CM | POA: Diagnosis present

## 2025-01-07 DIAGNOSIS — R300 Dysuria: Secondary | ICD-10-CM

## 2025-01-07 DIAGNOSIS — Z515 Encounter for palliative care: Secondary | ICD-10-CM

## 2025-01-07 DIAGNOSIS — F32A Depression, unspecified: Secondary | ICD-10-CM | POA: Diagnosis present

## 2025-01-07 DIAGNOSIS — K7290 Hepatic failure, unspecified without coma: Secondary | ICD-10-CM | POA: Insufficient documentation

## 2025-01-07 DIAGNOSIS — G5793 Unspecified mononeuropathy of bilateral lower limbs: Secondary | ICD-10-CM | POA: Diagnosis present

## 2025-01-07 DIAGNOSIS — R627 Adult failure to thrive: Secondary | ICD-10-CM | POA: Diagnosis not present

## 2025-01-07 DIAGNOSIS — Z8507 Personal history of malignant neoplasm of pancreas: Secondary | ICD-10-CM

## 2025-01-07 DIAGNOSIS — R933 Abnormal findings on diagnostic imaging of other parts of digestive tract: Secondary | ICD-10-CM

## 2025-01-07 DIAGNOSIS — I9589 Other hypotension: Secondary | ICD-10-CM | POA: Diagnosis present

## 2025-01-07 DIAGNOSIS — C7802 Secondary malignant neoplasm of left lung: Secondary | ICD-10-CM | POA: Diagnosis not present

## 2025-01-07 DIAGNOSIS — Z923 Personal history of irradiation: Secondary | ICD-10-CM | POA: Diagnosis not present

## 2025-01-07 DIAGNOSIS — R638 Other symptoms and signs concerning food and fluid intake: Secondary | ICD-10-CM

## 2025-01-07 DIAGNOSIS — Z66 Do not resuscitate: Secondary | ICD-10-CM | POA: Diagnosis not present

## 2025-01-07 DIAGNOSIS — E871 Hypo-osmolality and hyponatremia: Secondary | ICD-10-CM | POA: Diagnosis not present

## 2025-01-07 DIAGNOSIS — Z888 Allergy status to other drugs, medicaments and biological substances status: Secondary | ICD-10-CM

## 2025-01-07 DIAGNOSIS — C259 Malignant neoplasm of pancreas, unspecified: Secondary | ICD-10-CM | POA: Diagnosis present

## 2025-01-07 DIAGNOSIS — Z83719 Family history of colon polyps, unspecified: Secondary | ICD-10-CM

## 2025-01-07 DIAGNOSIS — Z7189 Other specified counseling: Secondary | ICD-10-CM

## 2025-01-07 DIAGNOSIS — E860 Dehydration: Principal | ICD-10-CM

## 2025-01-07 LAB — CBC WITH DIFFERENTIAL/PLATELET
Abs Immature Granulocytes: 0.23 K/uL — ABNORMAL HIGH (ref 0.00–0.07)
Basophils Absolute: 0 K/uL (ref 0.0–0.1)
Basophils Relative: 0 %
Eosinophils Absolute: 0.2 K/uL (ref 0.0–0.5)
Eosinophils Relative: 2 %
HCT: 28.7 % — ABNORMAL LOW (ref 36.0–46.0)
Hemoglobin: 9.3 g/dL — ABNORMAL LOW (ref 12.0–15.0)
Immature Granulocytes: 2 %
Lymphocytes Relative: 11 %
Lymphs Abs: 1.2 K/uL (ref 0.7–4.0)
MCH: 25.5 pg — ABNORMAL LOW (ref 26.0–34.0)
MCHC: 32.4 g/dL (ref 30.0–36.0)
MCV: 78.8 fL — ABNORMAL LOW (ref 80.0–100.0)
Monocytes Absolute: 1.8 K/uL — ABNORMAL HIGH (ref 0.1–1.0)
Monocytes Relative: 16 %
Neutro Abs: 7.8 K/uL — ABNORMAL HIGH (ref 1.7–7.7)
Neutrophils Relative %: 69 %
Platelets: 190 K/uL (ref 150–400)
RBC: 3.64 MIL/uL — ABNORMAL LOW (ref 3.87–5.11)
RDW: 17.2 % — ABNORMAL HIGH (ref 11.5–15.5)
WBC: 11.3 K/uL — ABNORMAL HIGH (ref 4.0–10.5)
nRBC: 0 % (ref 0.0–0.2)

## 2025-01-07 LAB — COMPREHENSIVE METABOLIC PANEL WITH GFR
ALT: 26 U/L (ref 0–44)
AST: 49 U/L — ABNORMAL HIGH (ref 15–41)
Albumin: 3 g/dL — ABNORMAL LOW (ref 3.5–5.0)
Alkaline Phosphatase: 283 U/L — ABNORMAL HIGH (ref 38–126)
Anion gap: 11 (ref 5–15)
BUN: 7 mg/dL — ABNORMAL LOW (ref 8–23)
CO2: 25 mmol/L (ref 22–32)
Calcium: 9 mg/dL (ref 8.9–10.3)
Chloride: 92 mmol/L — ABNORMAL LOW (ref 98–111)
Creatinine, Ser: 0.42 mg/dL — ABNORMAL LOW (ref 0.44–1.00)
GFR, Estimated: >60 mL/min
Glucose, Bld: 105 mg/dL — ABNORMAL HIGH (ref 70–99)
Potassium: 3.6 mmol/L (ref 3.5–5.1)
Sodium: 128 mmol/L — ABNORMAL LOW (ref 135–145)
Total Bilirubin: 1.6 mg/dL — ABNORMAL HIGH (ref 0.0–1.2)
Total Protein: 5.9 g/dL — ABNORMAL LOW (ref 6.5–8.1)

## 2025-01-07 LAB — CBG MONITORING, ED: Glucose-Capillary: 95 mg/dL (ref 70–99)

## 2025-01-07 LAB — PROTIME-INR
INR: 2 — ABNORMAL HIGH (ref 0.8–1.2)
Prothrombin Time: 23.9 s — ABNORMAL HIGH (ref 11.4–15.2)

## 2025-01-07 LAB — I-STAT CG4 LACTIC ACID, ED: Lactic Acid, Venous: 1.8 mmol/L (ref 0.5–1.9)

## 2025-01-07 MED ORDER — LACTATED RINGERS IV BOLUS
1000.0000 mL | Freq: Once | INTRAVENOUS | Status: AC
  Administered 2025-01-07: 1000 mL via INTRAVENOUS

## 2025-01-07 MED ORDER — ONDANSETRON HCL 4 MG/2ML IJ SOLN
4.0000 mg | Freq: Once | INTRAMUSCULAR | Status: AC
  Administered 2025-01-07: 4 mg via INTRAVENOUS
  Filled 2025-01-07: qty 2

## 2025-01-07 MED ORDER — MORPHINE SULFATE (PF) 4 MG/ML IV SOLN
4.0000 mg | Freq: Once | INTRAVENOUS | Status: AC
  Administered 2025-01-07: 4 mg via INTRAVENOUS
  Filled 2025-01-07: qty 1

## 2025-01-07 NOTE — ED Provider Notes (Signed)
 " [facility] Emergency Department Provider Note MRN:  [id number]  Arrival date & time: 01/08/2025     Chief Complaint   Weakness   History of Present Illness   Ashley Clark is a 65 y.o. year-old female presents to the ED with chief complaint of generalized weakness.  States that she hasn't been able to eat or drink for the past couple of weeks.  States that she normally takes zofran , but that hasn't really been helping either.  Has hx of pancreatic cancer.  Next treatment is on Wednesday.  States that she has had very dark urine and today was so weak that she could stand up or walk due to lack of energy/strength.  Denies fever or chills. Denies vomiting, but does report associated nausea.  She states that she has had some associated dysuria.   History provided by patient.   Review of Systems  Pertinent positive and negative review of systems noted in HPI.    Physical Exam   Vitals:   01/07/25 2313 01/08/25 0222  BP: 101/81 101/62  Pulse: (!) 110 99  Resp: 18 16  Temp: 98 F (36.7 C) 98.7 F (37.1 C)  SpO2: 96% 95%    CONSTITUTIONAL:  non toxic-appearing, NAD NEURO:  Alert and oriented x 3, CN 3-12 grossly intact EYES:  eyes equal and reactive ENT/NECK:  Supple, no stridor  CARDIO:  tachycardic, regular rhythm, appears well-perfused  PULM:  No respiratory distress, CTAB GI/GU:  non-distended, no focal tenderness MSK/SPINE:  No gross deformities, no edema, moves all extremities  SKIN:  no rash, atraumatic   *Additional and/or pertinent findings included in MDM below  Diagnostic and Interventional Summary    EKG Interpretation Date/Time:    Ventricular Rate:    PR Interval:    QRS Duration:    QT Interval:    QTC Calculation:   R Axis:      Text Interpretation:         Labs Reviewed  COMPREHENSIVE METABOLIC PANEL WITH GFR - Abnormal; Notable for the following components:      Result Value   Sodium 128 (*)    Chloride 92 (*)     Glucose, Bld 105 (*)    BUN 7 (*)    Creatinine, Ser 0.42 (*)    Total Protein 5.9 (*)    Albumin 3.0 (*)    AST 49 (*)    Alkaline Phosphatase 283 (*)    Total Bilirubin 1.6 (*)    All other components within normal limits  CBC WITH DIFFERENTIAL/PLATELET - Abnormal; Notable for the following components:   WBC 11.3 (*)    RBC 3.64 (*)    Hemoglobin 9.3 (*)    HCT 28.7 (*)    MCV 78.8 (*)    MCH 25.5 (*)    RDW 17.2 (*)    Neutro Abs 7.8 (*)    Monocytes Absolute 1.8 (*)    Abs Immature Granulocytes 0.23 (*)    All other components within normal limits  PROTIME-INR - Abnormal; Notable for the following components:   Prothrombin Time 23.9 (*)    INR 2.0 (*)    All other components within normal limits  URINALYSIS, W/ REFLEX TO CULTURE (INFECTION SUSPECTED) - Abnormal; Notable for the following components:   Color, Urine AMBER (*)    APPearance HAZY (*)    Ketones, ur 20 (*)    Protein, ur 30 (*)    Leukocytes,Ua LARGE (*)    All  other components within normal limits  CULTURE, BLOOD (ROUTINE X 2)  CULTURE, BLOOD (ROUTINE X 2)  URINE CULTURE  I-STAT CG4 LACTIC ACID, ED  CBG MONITORING, ED    No orders to display    Medications  cefTRIAXone  (ROCEPHIN ) 1 g in sodium chloride  0.9 % 100 mL IVPB (1 g Intravenous New Bag/Given 01/08/25 0251)  lactated ringers  bolus 1,000 mL (0 mLs Intravenous Stopped 01/08/25 0057)  lactated ringers  bolus 1,000 mL (0 mLs Intravenous Stopped 01/08/25 0106)  ondansetron  (ZOFRAN ) injection 4 mg (4 mg Intravenous Given 01/07/25 2249)  morphine  (PF) 4 MG/ML injection 4 mg (4 mg Intravenous Given 01/07/25 2354)  sodium chloride  0.9 % bolus 1,000 mL (1,000 mLs Intravenous New Bag/Given 01/08/25 0056)  HYDROmorphone  (DILAUDID ) injection 0.5 mg (0.5 mg Intravenous Given 01/08/25 0250)     Procedures  /  Critical Care Procedures  ED Course and Medical Decision Making  I have reviewed the triage vital signs, the nursing notes, and pertinent available  records from the EMR.  Social Determinants Affecting Complexity of Care: Patient has no clinically significant social determinants affecting this chief complaint..   ED Course:    Medical Decision Making Patient here with generalized weakness, decreased appetite and ability to eat due to persistent nausea.    Noted to be tachycardic and hypotensive.  Will start fluids.  Labs pending.  Doesn't appear toxic or septic.  Afebrile.    Urinalysis is abnormal.  With her reported dysuria, generalized weakness, feel that she should be further observed in the hospital.  She is a cancer patient, so is at risk for developing more severe infection.  Will treat with Rocephin  IV.   Amount and/or Complexity of Data Reviewed Labs: ordered. ECG/medicine tests: ordered.  Risk Prescription drug management. Decision regarding hospitalization.         Consultants: I consulted with Hospitalist, Dr. Dena, who is appreciated for admitting.   Treatment and Plan: Patient's exam and diagnostic results are concerning for UTI, generalized weakness.  Feel that patient will need admission to the hospital for further treatment and evaluation.    Final Clinical Impressions(s) / ED Diagnoses     ICD-10-CM   1. Dehydration  E86.0     2. Dysuria  R30.0       ED Discharge Orders     None         Discharge Instructions Discussed with and Provided to Patient:   Discharge Instructions   None      Vicky Charleston, PA-C 01/08/25 0255    Jerrol Agent, MD 01/08/25 1313  "

## 2025-01-07 NOTE — ED Triage Notes (Signed)
 Pt reports she is a cancer pt & hasn't been able to eat or drink in 2 weeks. Reports weakness now. Also c/o dark urine. Hx pancreatic cancer. Last trx dec 30.

## 2025-01-08 DIAGNOSIS — C259 Malignant neoplasm of pancreas, unspecified: Secondary | ICD-10-CM | POA: Diagnosis not present

## 2025-01-08 DIAGNOSIS — Z515 Encounter for palliative care: Secondary | ICD-10-CM

## 2025-01-08 DIAGNOSIS — E876 Hypokalemia: Secondary | ICD-10-CM | POA: Diagnosis present

## 2025-01-08 DIAGNOSIS — D509 Iron deficiency anemia, unspecified: Secondary | ICD-10-CM | POA: Insufficient documentation

## 2025-01-08 DIAGNOSIS — F32A Depression, unspecified: Secondary | ICD-10-CM | POA: Diagnosis present

## 2025-01-08 DIAGNOSIS — Z87891 Personal history of nicotine dependence: Secondary | ICD-10-CM | POA: Diagnosis not present

## 2025-01-08 DIAGNOSIS — I10 Essential (primary) hypertension: Secondary | ICD-10-CM | POA: Diagnosis present

## 2025-01-08 DIAGNOSIS — C799 Secondary malignant neoplasm of unspecified site: Secondary | ICD-10-CM | POA: Diagnosis not present

## 2025-01-08 DIAGNOSIS — C251 Malignant neoplasm of body of pancreas: Secondary | ICD-10-CM | POA: Diagnosis not present

## 2025-01-08 DIAGNOSIS — D689 Coagulation defect, unspecified: Secondary | ICD-10-CM | POA: Diagnosis present

## 2025-01-08 DIAGNOSIS — E871 Hypo-osmolality and hyponatremia: Secondary | ICD-10-CM | POA: Diagnosis present

## 2025-01-08 DIAGNOSIS — Z86711 Personal history of pulmonary embolism: Secondary | ICD-10-CM | POA: Diagnosis not present

## 2025-01-08 DIAGNOSIS — N390 Urinary tract infection, site not specified: Secondary | ICD-10-CM | POA: Diagnosis present

## 2025-01-08 DIAGNOSIS — B3781 Candidal esophagitis: Secondary | ICD-10-CM | POA: Diagnosis present

## 2025-01-08 DIAGNOSIS — R627 Adult failure to thrive: Secondary | ICD-10-CM | POA: Diagnosis present

## 2025-01-08 DIAGNOSIS — E1141 Type 2 diabetes mellitus with diabetic mononeuropathy: Secondary | ICD-10-CM | POA: Diagnosis present

## 2025-01-08 DIAGNOSIS — G893 Neoplasm related pain (acute) (chronic): Secondary | ICD-10-CM | POA: Diagnosis present

## 2025-01-08 DIAGNOSIS — Z66 Do not resuscitate: Secondary | ICD-10-CM | POA: Diagnosis present

## 2025-01-08 DIAGNOSIS — C7802 Secondary malignant neoplasm of left lung: Secondary | ICD-10-CM | POA: Diagnosis present

## 2025-01-08 DIAGNOSIS — N3000 Acute cystitis without hematuria: Secondary | ICD-10-CM | POA: Diagnosis not present

## 2025-01-08 DIAGNOSIS — Z8507 Personal history of malignant neoplasm of pancreas: Secondary | ICD-10-CM | POA: Diagnosis not present

## 2025-01-08 DIAGNOSIS — K229 Disease of esophagus, unspecified: Secondary | ICD-10-CM | POA: Diagnosis not present

## 2025-01-08 DIAGNOSIS — C787 Secondary malignant neoplasm of liver and intrahepatic bile duct: Secondary | ICD-10-CM | POA: Diagnosis present

## 2025-01-08 DIAGNOSIS — K222 Esophageal obstruction: Secondary | ICD-10-CM | POA: Diagnosis present

## 2025-01-08 DIAGNOSIS — E119 Type 2 diabetes mellitus without complications: Secondary | ICD-10-CM | POA: Diagnosis not present

## 2025-01-08 DIAGNOSIS — R64 Cachexia: Secondary | ICD-10-CM | POA: Diagnosis present

## 2025-01-08 DIAGNOSIS — C786 Secondary malignant neoplasm of retroperitoneum and peritoneum: Secondary | ICD-10-CM | POA: Diagnosis present

## 2025-01-08 DIAGNOSIS — R634 Abnormal weight loss: Secondary | ICD-10-CM | POA: Diagnosis not present

## 2025-01-08 DIAGNOSIS — R1310 Dysphagia, unspecified: Secondary | ICD-10-CM | POA: Diagnosis not present

## 2025-01-08 DIAGNOSIS — R531 Weakness: Secondary | ICD-10-CM | POA: Diagnosis present

## 2025-01-08 DIAGNOSIS — E43 Unspecified severe protein-calorie malnutrition: Secondary | ICD-10-CM | POA: Insufficient documentation

## 2025-01-08 DIAGNOSIS — F17210 Nicotine dependence, cigarettes, uncomplicated: Secondary | ICD-10-CM | POA: Diagnosis present

## 2025-01-08 DIAGNOSIS — R933 Abnormal findings on diagnostic imaging of other parts of digestive tract: Secondary | ICD-10-CM | POA: Diagnosis not present

## 2025-01-08 DIAGNOSIS — K7460 Unspecified cirrhosis of liver: Secondary | ICD-10-CM | POA: Insufficient documentation

## 2025-01-08 DIAGNOSIS — I272 Pulmonary hypertension, unspecified: Secondary | ICD-10-CM | POA: Diagnosis present

## 2025-01-08 DIAGNOSIS — R1319 Other dysphagia: Secondary | ICD-10-CM | POA: Diagnosis not present

## 2025-01-08 DIAGNOSIS — Z923 Personal history of irradiation: Secondary | ICD-10-CM | POA: Diagnosis not present

## 2025-01-08 DIAGNOSIS — Z7901 Long term (current) use of anticoagulants: Secondary | ICD-10-CM | POA: Diagnosis not present

## 2025-01-08 LAB — URINALYSIS, W/ REFLEX TO CULTURE (INFECTION SUSPECTED)
Bacteria, UA: NONE SEEN
Bilirubin Urine: NEGATIVE
Glucose, UA: NEGATIVE mg/dL
Hgb urine dipstick: NEGATIVE
Ketones, ur: 20 mg/dL — AB
Nitrite: NEGATIVE
Protein, ur: 30 mg/dL — AB
Specific Gravity, Urine: 1.014 (ref 1.005–1.030)
pH: 6 (ref 5.0–8.0)

## 2025-01-08 LAB — HIV ANTIBODY (ROUTINE TESTING W REFLEX): HIV Screen 4th Generation wRfx: NONREACTIVE

## 2025-01-08 MED ORDER — CHLORHEXIDINE GLUCONATE CLOTH 2 % EX PADS
6.0000 | MEDICATED_PAD | Freq: Every day | CUTANEOUS | Status: DC
  Administered 2025-01-08 – 2025-01-15 (×7): 6 via TOPICAL

## 2025-01-08 MED ORDER — SODIUM CHLORIDE 0.9% FLUSH: 10.0000 mL | INTRAVENOUS | Status: DC | PRN

## 2025-01-08 MED ORDER — SODIUM CHLORIDE 0.9 % IV SOLN
1.0000 g | INTRAVENOUS | Status: AC
  Administered 2025-01-08 – 2025-01-11 (×4): 1 g via INTRAVENOUS
  Filled 2025-01-08 (×4): qty 10

## 2025-01-08 MED ORDER — OXYCODONE HCL 5 MG PO TABS
5.0000 mg | ORAL_TABLET | ORAL | Status: DC | PRN
  Administered 2025-01-08 – 2025-01-14 (×11): 5 mg via ORAL
  Filled 2025-01-08 (×12): qty 1

## 2025-01-08 MED ORDER — ENSURE PLUS HIGH PROTEIN PO LIQD
237.0000 mL | Freq: Two times a day (BID) | ORAL | Status: DC
  Administered 2025-01-08 – 2025-01-15 (×8): 237 mL via ORAL

## 2025-01-08 MED ORDER — ENOXAPARIN SODIUM 40 MG/0.4ML IJ SOSY: 40.0000 mg | PREFILLED_SYRINGE | INTRAMUSCULAR | Status: DC

## 2025-01-08 MED ORDER — APIXABAN 5 MG PO TABS
5.0000 mg | ORAL_TABLET | Freq: Two times a day (BID) | ORAL | Status: DC
  Administered 2025-01-08 – 2025-01-11 (×6): 5 mg via ORAL
  Filled 2025-01-08 (×6): qty 1

## 2025-01-08 MED ORDER — KCL IN DEXTROSE-NACL 20-5-0.9 MEQ/L-%-% IV SOLN
INTRAVENOUS | Status: DC
  Filled 2025-01-08 (×3): qty 1000

## 2025-01-08 MED ORDER — PROCHLORPERAZINE EDISYLATE 10 MG/2ML IJ SOLN
10.0000 mg | INTRAMUSCULAR | Status: DC | PRN
  Administered 2025-01-11 (×2): 10 mg via INTRAVENOUS
  Filled 2025-01-08 (×2): qty 2

## 2025-01-08 MED ORDER — METOCLOPRAMIDE HCL 5 MG/ML IJ SOLN: 10.0000 mg | Freq: Four times a day (QID) | INTRAMUSCULAR | Status: DC | PRN

## 2025-01-08 MED ORDER — BISACODYL 5 MG PO TBEC: 5.0000 mg | DELAYED_RELEASE_TABLET | Freq: Every day | ORAL | Status: DC | PRN

## 2025-01-08 MED ORDER — HYDROMORPHONE HCL 1 MG/ML IJ SOLN
0.5000 mg | Freq: Once | INTRAMUSCULAR | Status: AC
  Administered 2025-01-08: 0.5 mg via INTRAVENOUS
  Filled 2025-01-08: qty 1

## 2025-01-08 MED ORDER — METOCLOPRAMIDE HCL 10 MG PO TABS: 10.0000 mg | ORAL_TABLET | Freq: Three times a day (TID) | ORAL | Status: DC | PRN

## 2025-01-08 MED ORDER — SODIUM CHLORIDE 0.9 % IV BOLUS
1000.0000 mL | Freq: Once | INTRAVENOUS | Status: AC
  Administered 2025-01-08: 1000 mL via INTRAVENOUS

## 2025-01-08 MED ORDER — SODIUM CHLORIDE 0.9% FLUSH
10.0000 mL | Freq: Two times a day (BID) | INTRAVENOUS | Status: DC
  Administered 2025-01-08 – 2025-01-12 (×9): 10 mL
  Administered 2025-01-13: 20 mL
  Administered 2025-01-13 – 2025-01-15 (×3): 10 mL

## 2025-01-08 MED ORDER — SODIUM CHLORIDE 0.9 % IV SOLN
1.0000 g | Freq: Once | INTRAVENOUS | Status: AC
  Administered 2025-01-08: 1 g via INTRAVENOUS
  Filled 2025-01-08: qty 10

## 2025-01-08 NOTE — H&P (Signed)
 " History and Physical    Patient: Ashley Clark FMW:[id number] DOB: 06-09-1961 DOA: 01/07/2025 DOS: the patient was seen and examined on 01/08/2025 PCP: [name], PA  Patient coming from: Home  Chief Complaint:  Chief Complaint  Patient presents with   Weakness       HPI:  64 y.o. 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   Patient has had a slow decrease in appetite companied by weight loss over the last year.  This is progressed to the point in the last month, that she can eat only a few bites of yogurt or sips of Ensure at a time, even with pretreatment with ondansetron .  This is accompanied by severe nausea, without vomiting.  No difference between solids and liquids.  In the last few days, she has gotten so weak she can barely stand, and not been able to tolerate any oral intake, so she came to the ER.  In the ER, UA showed pyuria.  She had hyponatremia and hypokalemia.  No leukocytosis.      Review of Systems  Constitutional:  Positive for malaise/fatigue. Negative for chills and fever.  Gastrointestinal:  Positive for abdominal pain and nausea. Negative for vomiting.  Genitourinary:  Negative for dysuria, frequency and urgency.  Musculoskeletal:  Negative for back pain, myalgias and neck pain.  Neurological:  Positive for weakness. Negative for focal weakness and loss of consciousness.  All other systems reviewed and are negative.    Past Medical History:  Diagnosis Date   Acute pulmonary embolism (HCC) 10/29/2023   Allergy    Back pain    Cancer (HCC) 2019   left breast cancer-last radiation Dec.2019   Diabetes mellitus without complication [facility]    Medical history non-contributory    Personal history of radiation therapy    Port-A-Cath in place - removed 12-11-2023 due to port-a-cath infection. 11/13/2023   Prediabetes     Tiredness    Vitamin D  deficiency    Past Surgical History:  Procedure Laterality Date   BREAST BIOPSY Left 08/04/2018   x3   BREAST LUMPECTOMY Left    09-29-18   BREAST LUMPECTOMY WITH RADIOACTIVE SEED AND SENTINEL LYMPH NODE BIOPSY Left 09/29/2018   Procedure: LEFT BREAST LUMPECTOMY WITH RADIOACTIVE SEED AND SENTINEL LYMPH NODE BIOPSY;  Surgeon: [name], MD;  Location: [facility];  Service: General;  Laterality: Left;   COLONOSCOPY  2016   IR CV LINE INJECTION  11/30/2024   IR IMAGING GUIDED PORT INSERTION  11/03/2023   IR PATIENT EVAL TECH 0-60 MINS  12/18/2023   IR PATIENT EVAL TECH 0-60 MINS  12/24/2023   IR PATIENT EVAL TECH 0-60 MINS  12/28/2023   IR PATIENT EVAL TECH 0-60 MINS  01/04/2024   IR PATIENT EVAL TECH 0-60 MINS  01/13/2024   IR PATIENT EVAL TECH 0-60 MINS  01/22/2024   IR RADIOLOGIST EVAL & MGMT  12/15/2023   IR REMOVAL TUN ACCESS W/ PORT W/O FL MOD SED  12/11/2023   POLYPECTOMY     WISDOM TOOTH EXTRACTION     Social History:  reports that she quit smoking about 9 years ago. Her smoking use included cigarettes. She smoked an average of 0.3 packs per day. She has never used smokeless tobacco. She reports that she does not currently use alcohol after a past usage of about 4.0 standard drinks of  alcohol per week. She reports that she does not use drugs.  Allergies[1]  Family History  Problem Relation Age of Onset   Breast cancer Mother 27   Colon polyps Mother    Colon cancer Neg Hx    Rectal cancer Neg Hx    Stomach cancer Neg Hx    Esophageal cancer Neg Hx     Prior to Admission medications  Medication Sig Start Date End Date Taking? Authorizing Provider  apixaban  (ELIQUIS ) 5 MG TABS tablet Take 1 tablet (5 mg total) by mouth 2 (two) times daily. 08/31/24  Yes [name], MD  cyanocobalamin (VITAMIN B12) 500 MCG tablet Take 500 mcg by mouth daily.   Yes [provider]  metoCLOPramide  (REGLAN ) 10 MG tablet Take 1 tablet (10 mg total)  by mouth every 8 (eight) hours as needed for nausea. 11/02/24  Yes [name], MD  ondansetron  (ZOFRAN ) 8 MG tablet Take 1 tablet (8 mg total) by mouth every 8 (eight) hours as needed for nausea or vomiting. 10/26/24  Yes [name], MD  potassium chloride  SA (KLOR-CON  M) 20 MEQ tablet Take 1 tablet (20 mEq total) by mouth 2 (two) times daily. 11/02/24  Yes [name], MD  prochlorperazine  (COMPAZINE ) 10 MG tablet 1 tablet as needed for nausea or vomiting Orally every 6 hours   Yes [provider]  cetirizine (ZYRTEC) 10 MG chewable tablet Chew 10 mg by mouth daily. Patient not taking: Reported on 01/08/2025    [provider]    Physical Exam: Vitals:   01/08/25 0435 01/08/25 0456 01/08/25 0457 01/08/25 0955  BP: (!) 91/59 90/61  (!) 81/53  Pulse: 99 (!) 103  (!) 106  Resp: 16 18  18   Temp: 98.4 F (36.9 C) 97.9 F (36.6 C)  98.8 F (37.1 C)  TempSrc: Oral Oral  Oral  SpO2: 95% 99%  99%  Weight:   65.5 kg   Height:   5' 3 (1.6 m)    Thin adult female, lying in bed, appears weak and tired RRR, no murmurs, no Peripheral edema Respiratory rate normal, lungs clear without rales or wheezes Abdomen soft, no tenderness palpation or guarding, no ascites or distention Attention normal, affect tired, judgment and insight appear normal, face metric, speech fluent    Data Reviewed: Basic metabolic panel shows hyponatremia, hypokalemia BUN and creatinine both low LFTs show minimal elevation of alk phos and AST and total bilirubin. 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  CT shows no evidence of obstruction - Upper GI series, small bowel follow-through - Consult oncology -Continue home Reglan , Compazine  - IV fluids   Acute cystitis versus asymptomatic pyuria Reports she feels better since starting antibiotics - Rocephin  for now - Follow blood and urine cultures  Acute on chronic hypotension Blood pressure appears to be 90/60 for some time now.  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 Inpatient status is judged to be reasonable and necessary in order to provide the required intensity of service to ensure the patient's safety. The patient's presenting symptoms, physical exam findings, and initial radiographic and laboratory data in the context of their chronic comorbidities is felt to place them at high risk for further clinical deterioration. Furthermore, it is not anticipated that the patient will be medically stable for discharge from the hospital within 2 midnights of admission.   * I certify that at the point of admission it is my clinical judgment that the patient will require inpatient hospital care spanning beyond 2 midnights from the point of admission due to high intensity of service, high risk for further deterioration and high  frequency of surveillance required.*  Author: Lonni SHAUNNA Dalton, MD 01/08/2025 12:18 PM  Patient seen at 7:30AM  For on call review [email or web address].       [1]  Allergies Allergen Reactions   Irinotecan  Liposome Shortness Of Breath and Other (See Comments)    (Onivyde , given via IV) Pt had hypersensitivity rxn on 11/11/2023, see hypersensitivity rxn note for details. Pt had s/s of back pain, abdominal discomfort, SOB, flushing, and hypotension.    "

## 2025-01-08 NOTE — ED Notes (Signed)
 Called to notify spouse, Loletha, of patient's assigned room per pt request.  Spouse answered and verbalized understanding of received information

## 2025-01-08 NOTE — Hospital Course (Signed)
 64 y.o. 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

## 2025-01-08 NOTE — Plan of Care (Signed)
 PMT no charge note 64 year old female with metastatic pancreatic adenocarcinoma liver lung lymph nodes, significant comorbidities of diabetes hypertension prior PE remote history of breast cancer status postlumpectomy As per chart review and as per discussions with [facility] MD colleague, patient admitted for progressive functional decline and inability to tolerate p.o. intake worsening anorexia and weight loss now with severe protein calorie malnutrition symptom burden also complicated by profound early satiety and persistent nausea.  Thus, patient arrived to the emergency department where further evaluation was notable for hyponatremia, chronic low blood pressure, pyuria that is being treated empirically with ceftriaxone , CT abdomen without mechanical obstruction.  Upper GI small bowel follow-through pending.  Oncology consulted for disease status and treatment options discussion.  In the interim, given suspected pancreatic cancer related visceral pain and escalating symptom burden of refractory nausea early satiety and poor oral intake consideration could be given to celiac plexus neurolysis as a palliative intervention to reduce tumor related side effects and to potentially improve pain control nausea hoping to increase appetite and overall functional status while minimizing opioid burden.  Full consult note to follow on 01-09-2025 Thank you for the consult.

## 2025-01-08 NOTE — ED Notes (Signed)
 ED TO INPATIENT HANDOFF REPORT  Name/Age/Gender Ashley Clark 64 y.o. female  Code Status    Code Status Orders  (From admission, onward)           Start     Ordered   01/08/25 0355  Full code  Continuous       Question:  By:  Answer:  Consent: discussion documented in EHR   01/08/25 0355           Code Status History     Date Active Date Inactive Code Status Order ID Comments User Context   12/24/2023 0944 12/25/2023 0503 Full Code [id number]  Kelsie Harman POUR, PA-C HOV   12/11/2023 1938 12/14/2023 1936 Full Code [id number]  Lou Claretta HERO, MD ED   10/29/2023 2132 11/03/2023 2239 Full Code [id number]  Ricky Alfrieda DASEN, DO Inpatient       Home/SNF/Other Home  Chief Complaint Urinary tract infection [N39.0]  Level of Care/Admitting Diagnosis ED Disposition     ED Disposition  Admit   Condition  --   Comment  Hospital Area: [facility] [[id number]]  Level of Care: Med-Surg [16]  May admit patient to [facility] or [facility] if equivalent level of care is available:: No  Diagnosis: Urinary tract infection [757885]  Admitting Physician: DENA CHARLESTON [[id number]]  Attending Physician: DENA CHARLESTON [[id number]]  Certification:: I certify this patient will need inpatient services for at least 2 midnights  Expected Medical Readiness: 01/11/2025          Medical History Past Medical History:  Diagnosis Date   Acute pulmonary embolism (HCC) 10/29/2023   Allergy    Back pain    Cancer (HCC) 2019   left breast cancer-last radiation Dec.2019   Diabetes mellitus without complication [facility])    Medical history non-contributory    Personal history of radiation therapy    Port-A-Cath in place - removed 12-11-2023 due to port-a-cath infection. 11/13/2023   Prediabetes    Tiredness    Vitamin D  deficiency     Allergies Allergies[1]  IV Location/Drains/Wounds Patient Lines/Drains/Airways Status     Active Line/Drains/Airways     Name  Placement date Placement time Site Days   PICC Double Lumen 02/17/24 Left Brachial 44 cm 02/17/24  1327  -- 326            Labs/Imaging Results for orders placed or performed during the hospital encounter of 01/07/25 (from the past 48 hours)  CBG monitoring, ED     Status: None   Collection Time: 01/07/25  9:42 PM  Result Value Ref Range   Glucose-Capillary 95 70 - 99 mg/dL    Comment: Glucose reference range applies only to samples taken after fasting for at least 8 hours.  Comprehensive metabolic panel     Status: Abnormal   Collection Time: 01/07/25  9:49 PM  Result Value Ref Range   Sodium 128 (L) 135 - 145 mmol/L   Potassium 3.6 3.5 - 5.1 mmol/L   Chloride 92 (L) 98 - 111 mmol/L   CO2 25 22 - 32 mmol/L   Glucose, Bld 105 (H) 70 - 99 mg/dL    Comment: Glucose reference range applies only to samples taken after fasting for at least 8 hours.   BUN 7 (L) 8 - 23 mg/dL   Creatinine, Ser 9.57 (L) 0.44 - 1.00 mg/dL   Calcium  9.0 8.9 - 10.3 mg/dL   Total Protein 5.9 (L) 6.5 - 8.1 g/dL   Albumin 3.0 (L) 3.5 -  5.0 g/dL   AST 49 (H) 15 - 41 U/L   ALT 26 0 - 44 U/L   Alkaline Phosphatase 283 (H) 38 - 126 U/L   Total Bilirubin 1.6 (H) 0.0 - 1.2 mg/dL   GFR, Estimated >39 >39 mL/min    Comment: (NOTE) Calculated using the CKD-EPI Creatinine Equation (2021)    Anion gap 11 5 - 15    Comment: Performed at [facility], [address] [place], [place] [address]  CBC with Differential     Status: Abnormal   Collection Time: 01/07/25  9:49 PM  Result Value Ref Range   WBC 11.3 (H) 4.0 - 10.5 K/uL   RBC 3.64 (L) 3.87 - 5.11 MIL/uL   Hemoglobin 9.3 (L) 12.0 - 15.0 g/dL   HCT 71.2 (L) 63.9 - 53.9 %   MCV 78.8 (L) 80.0 - 100.0 fL   MCH 25.5 (L) 26.0 - 34.0 pg   MCHC 32.4 30.0 - 36.0 g/dL   RDW 82.7 (H) 88.4 - 84.4 %   Platelets 190 150 - 400 K/uL   nRBC 0.0 0.0 - 0.2 %   Neutrophils Relative % 69 %   Neutro Abs 7.8 (H) 1.7 - 7.7 K/uL   Lymphocytes Relative 11 %    Lymphs Abs 1.2 0.7 - 4.0 K/uL   Monocytes Relative 16 %   Monocytes Absolute 1.8 (H) 0.1 - 1.0 K/uL   Eosinophils Relative 2 %   Eosinophils Absolute 0.2 0.0 - 0.5 K/uL   Basophils Relative 0 %   Basophils Absolute 0.0 0.0 - 0.1 K/uL   Immature Granulocytes 2 %   Abs Immature Granulocytes 0.23 (H) 0.00 - 0.07 K/uL    Comment: Performed at [facility], [address] [place], [place] [address]  Protime-INR     Status: Abnormal   Collection Time: 01/07/25  9:49 PM  Result Value Ref Range   Prothrombin Time 23.9 (H) 11.4 - 15.2 seconds   INR 2.0 (H) 0.8 - 1.2    Comment: (NOTE) INR goal varies based on device and disease states. Performed at [facility], [address], [place], [place] [address]   I-Stat Lactic Acid, ED     Status: None   Collection Time: 01/07/25  9:53 PM  Result Value Ref Range   Lactic Acid, Venous 1.8 0.5 - 1.9 mmol/L  Urinalysis, w/ Reflex to Culture (Infection Suspected) -Urine, Clean Catch     Status: Abnormal   Collection Time: 01/08/25 12:49 AM  Result Value Ref Range   Specimen Source URINE, CATHETERIZED    Color, Urine AMBER (A) YELLOW    Comment: BIOCHEMICALS MAY BE AFFECTED BY COLOR   APPearance HAZY (A) CLEAR   Specific Gravity, Urine 1.014 1.005 - 1.030   pH 6.0 5.0 - 8.0   Glucose, UA NEGATIVE NEGATIVE mg/dL   Hgb urine dipstick NEGATIVE NEGATIVE   Bilirubin Urine NEGATIVE NEGATIVE   Ketones, ur 20 (A) NEGATIVE mg/dL   Protein, ur 30 (A) NEGATIVE mg/dL   Nitrite NEGATIVE NEGATIVE   Leukocytes,Ua LARGE (A) NEGATIVE   RBC / HPF 0-5 0 - 5 RBC/hpf   WBC, UA 21-50 0 - 5 WBC/hpf    Comment:        Reflex urine culture not performed if WBC <=10, OR if Squamous epithelial cells >5. If Squamous epithelial cells >5 suggest recollection.    Bacteria, UA NONE SEEN NONE SEEN   Squamous Epithelial / HPF 0-5 0 - 5 /  HPF   Mucus PRESENT     Comment: Performed at [facility], [address], [place], [place] [address]   No results found.  Pending Labs Unresulted Labs (From admission, onward)     Start     Ordered   01/08/25 0355  HIV Antibody (routine testing w rflx)  (HIV Antibody (Routine testing w reflex) panel)  Once,   R        01/08/25 0355   01/08/25 0049  Urine Culture  Once,   R        01/08/25 0049   01/07/25 2136  Culture, blood (Routine x 2)  BLOOD CULTURE X 2,   R (with STAT occurrences)      01/07/25 2135            Vitals/Pain Today's Vitals   01/08/25 0106 01/08/25 0222 01/08/25 0250 01/08/25 0435  BP:  101/62  (!) 91/59  Pulse:  99  99  Resp:  16  16  Temp:  98.7 F (37.1 C)  98.4 F (36.9 C)  TempSrc:  Oral  Oral  SpO2:  95%  95%  PainSc: 4   8  0-No pain    Isolation Precautions No active isolations  Medications Medications  metoCLOPramide  (REGLAN ) tablet 10 mg (has no administration in time range)  apixaban  (ELIQUIS ) tablet 5 mg (has no administration in time range)  lactated ringers  bolus 1,000 mL (0 mLs Intravenous Stopped 01/08/25 0057)  lactated ringers  bolus 1,000 mL (0 mLs Intravenous Stopped 01/08/25 0106)  ondansetron  (ZOFRAN ) injection 4 mg (4 mg Intravenous Given 01/07/25 2249)  morphine  (PF) 4 MG/ML injection 4 mg (4 mg Intravenous Given 01/07/25 2354)  sodium chloride  0.9 % bolus 1,000 mL (0 mLs Intravenous Stopped 01/08/25 0434)  cefTRIAXone  (ROCEPHIN ) 1 g in sodium chloride  0.9 % 100 mL IVPB (0 g Intravenous Stopped 01/08/25 0321)  HYDROmorphone  (DILAUDID ) injection 0.5 mg (0.5 mg Intravenous Given 01/08/25 0250)    Mobility walks with person assist     [1]  Allergies Allergen Reactions   Irinotecan  Liposome Shortness Of Breath and Other (See Comments)    (Onivyde , given via IV) Pt had hypersensitivity rxn on 11/11/2023, see hypersensitivity rxn note for details. Pt had s/s of back pain, abdominal discomfort, SOB, flushing, and hypotension.

## 2025-01-09 ENCOUNTER — Inpatient Hospital Stay (HOSPITAL_COMMUNITY)

## 2025-01-09 DIAGNOSIS — C787 Secondary malignant neoplasm of liver and intrahepatic bile duct: Secondary | ICD-10-CM

## 2025-01-09 DIAGNOSIS — N3000 Acute cystitis without hematuria: Secondary | ICD-10-CM | POA: Diagnosis not present

## 2025-01-09 DIAGNOSIS — C251 Malignant neoplasm of body of pancreas: Secondary | ICD-10-CM | POA: Diagnosis not present

## 2025-01-09 DIAGNOSIS — N390 Urinary tract infection, site not specified: Secondary | ICD-10-CM

## 2025-01-09 LAB — CBC
HCT: 24.4 % — ABNORMAL LOW (ref 36.0–46.0)
Hemoglobin: 7.8 g/dL — ABNORMAL LOW (ref 12.0–15.0)
MCH: 25.7 pg — ABNORMAL LOW (ref 26.0–34.0)
MCHC: 32 g/dL (ref 30.0–36.0)
MCV: 80.5 fL (ref 80.0–100.0)
Platelets: 156 K/uL (ref 150–400)
RBC: 3.03 MIL/uL — ABNORMAL LOW (ref 3.87–5.11)
RDW: 17.2 % — ABNORMAL HIGH (ref 11.5–15.5)
WBC: 10.9 K/uL — ABNORMAL HIGH (ref 4.0–10.5)
nRBC: 0 % (ref 0.0–0.2)

## 2025-01-09 LAB — COMPREHENSIVE METABOLIC PANEL WITH GFR
ALT: 16 U/L (ref 0–44)
AST: 37 U/L (ref 15–41)
Albumin: 2.4 g/dL — ABNORMAL LOW (ref 3.5–5.0)
Alkaline Phosphatase: 215 U/L — ABNORMAL HIGH (ref 38–126)
Anion gap: 9 (ref 5–15)
BUN: <5 mg/dL — ABNORMAL LOW (ref 8–23)
CO2: 24 mmol/L (ref 22–32)
Calcium: 8.1 mg/dL — ABNORMAL LOW (ref 8.9–10.3)
Chloride: 97 mmol/L — ABNORMAL LOW (ref 98–111)
Creatinine, Ser: 0.3 mg/dL — ABNORMAL LOW (ref 0.44–1.00)
GFR, Estimated: >60 mL/min
Glucose, Bld: 131 mg/dL — ABNORMAL HIGH (ref 70–99)
Potassium: 3.4 mmol/L — ABNORMAL LOW (ref 3.5–5.1)
Sodium: 130 mmol/L — ABNORMAL LOW (ref 135–145)
Total Bilirubin: 1.1 mg/dL (ref 0.0–1.2)
Total Protein: 4.8 g/dL — ABNORMAL LOW (ref 6.5–8.1)

## 2025-01-09 LAB — URINE CULTURE

## 2025-01-09 MED ORDER — POTASSIUM CHLORIDE CRYS ER 20 MEQ PO TBCR
40.0000 meq | EXTENDED_RELEASE_TABLET | Freq: Once | ORAL | Status: AC
  Administered 2025-01-09: 40 meq via ORAL
  Filled 2025-01-09: qty 2

## 2025-01-09 MED ORDER — HYDROMORPHONE HCL 1 MG/ML IJ SOLN
1.0000 mg | INTRAMUSCULAR | Status: DC | PRN
  Administered 2025-01-09 – 2025-01-10 (×3): 1 mg via INTRAVENOUS
  Filled 2025-01-09 (×3): qty 1

## 2025-01-09 NOTE — Progress Notes (Signed)
" °  Progress Note   Patient: Ashley Clark FMW:[id number] DOB: 1961/06/10 DOA: 01/07/2025     1 DOS: the patient was seen and examined on 01/09/2025 at 9:54 AM      Brief hospital course: 64 y.o. 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     Assessment and Plan: Severe protein calorie malnutrition Weight loss Dysphagia Symptoms slowly progressive over months.  Worse with most recent chemo.  Minimal oral intake, has lost 50 lbs in last 6 months.    Here, given subjective obstruction in stomach, patient underwent upper GI series, small bowel follow-through today, report pending.  She relates that there was no obstruction on exam. - Follow-up UGI, SB follow-through - If abnormal, will consult GI - Continue Reglan , Compazine  - Hold further IV fluids for now-clear liquid diet - Consult palliative care, oncology  Acute cystitis versus asymptomatic pyuria Symptoms resolved.  Urine culture contaminated. - Continue Rocephin , day 3  Acute on chronic hypotension Blood pressure resolved to baseline 90/60.  Hypokalemia Hyponatremia Hyponatremia asymptomatic - Supplement potassium  Microcytic anemia Hemoglobin is dropped, no clinical bleeding, suspect this is dilution-trend hemoglobin  Adenocarcinoma of head of the pancreas, metastatic to liver, lung, and lymph node - Consult palliative care and oncology       Subjective: The patient had her small bowel series today.  No fever, no confusion, no respiratory distress.     Physical Exam: BP 107/69 (BP Location: Right Arm)   Pulse (!) 131   Temp 98.2 F (36.8 C) (Oral)   Resp (!) 21   Ht 5' 3 (1.6 m)   Wt 65.5 kg   LMP 03/12/2015   SpO2 100%   BMI 25.58 kg/m   Female, sitting up in bed, interactive and appropriate RRR, no murmurs, no peripheral edema Respiratory normal, lungs clear  without rales or wheezes Abdomen soft, no tenderness palpation or guarding, no ascites or distention Attention normal, affect pleasant, judgment and insight appear normal, face symmetric, speech fluent    Data Reviewed: CBC shows worsening anemia, BMP shows stable hyponatremia, worsening hypokalemia No leukocytosis.   Family Communication:     Disposition: Status is: Inpatient         Author: Lonni SHAUNNA Dalton, MD 01/09/2025 3:54 PM  For on call review [email or web address].    "

## 2025-01-09 NOTE — Consult Note (Signed)
 "                                                                                   Consultation Note Date: 01/09/2025   Patient Name: Ashley Clark  DOB: 1961-11-07  MRN: [id number]  Age / Sex: 64 y.o., female  PCP: [name], PA Referring Physician: Jonel Lonni SQUIBB, *  Reason for Consultation: Establishing goals of care, Non pain symptom management, and Pain control  HPI/Patient Profile: 64 y.o. female   admitted on 01/07/2025   64 year old female with metastatic pancreatic adenocarcinoma involving liver and lungs lymph nodes with comorbidities of diabetes hypertension prior pulmonary embolism remote history of breast cancer status postlumpectomy Per chart review and after discussion with the hospital medicine attending colleague, it is learned that she was admitted for progressive functional decline with inability to tolerate oral intake worsening anorexia and weight loss. Patient symptom burden notable for profound early satiety persistent nausea and suspected malignancy related visceral pain initial evaluation also demonstrated hyponatremia chronic hypotension and pyuria that is currently being treated empirically with antibiotics CT scan of the abdomen and pelvis showed no imaging of mechanical obstruction but upper GI series with small bowel follow-through is-26  Clinical Assessment and Goals of Care: Oncology has also been consulted to assess disease status prognosis and potential treatment options in the current setting In the interim palliative medicine consultation follows Palliative medicine recommendations are focused on symptom control and supportive care Patient at present going for upper GI series with small bowel follow-through Patient is awake alert resting in bed husband at bedside Palliative medicine is specialized medical care for people living with serious illness. It focuses on providing relief from the symptoms and stress of a serious illness. The goal is to  improve quality of life for both the patient and the family. Goals of care: Broad aims of medical therapy in relation to the patient's values and preferences. Our aim is to provide medical care aimed at enabling patients to achieve the goals that matter most to them, given the circumstances of their particular medical situation and their constraints.    NEXT OF KIN  Husband present at bedside.   SUMMARY OF RECOMMENDATIONS   Full code full scope care, monitor hospital course and overall disease trajectory of illness.  Further goals of care discussions to follow after oncology consultation and input.  Nausea and vomiting Continue antiemetic and promotility therapy with Reglan .  Pain  utilize oxycodone  immediate release as needed for cancer related pain while monitoring efficacy and tolerance Given the suspected pancreatic cancer related visceral pain contributing to nausea early satiety and poor oral intake consideration may be given to celiac plexus neurolysis palliative intervention to reduce tumor related pain and associated symptoms.  The goal here would be to improve comfort stimulate appetite and hopefully attempt to increase functional status while minimizing opioid burden.  Palliative medicine team to continue to engage in goals of care discussions and advance care planning including care coordination and disposition options. Thank you for the consult.    Code Status/Advance Care Planning: Full code   Symptom Management:     Palliative Prophylaxis:  Frequent Pain Assessment  Additional  Recommendations (Limitations, Scope, Preferences): Full Scope Treatment  Psycho-social/Spiritual:  Desire for further Chaplaincy support:yes Additional Recommendations: Caregiving  Support/Resources  Prognosis:  Unable to determine  Discharge Planning: To Be Determined      Primary Diagnoses: Present on Admission:  Urinary tract infection  Pancreatic cancer (HCC)   I have  reviewed the medical record, interviewed the patient and family, and examined the patient. The following aspects are pertinent.  Past Medical History:  Diagnosis Date   Acute pulmonary embolism (HCC) 10/29/2023   Allergy    Back pain    Cancer (HCC) 2019   left breast cancer-last radiation Dec.2019   Diabetes mellitus without complication [facility])    Medical history non-contributory    Personal history of radiation therapy    Port-A-Cath in place - removed 12-11-2023 due to port-a-cath infection. 11/13/2023   Prediabetes    Tiredness    Vitamin D  deficiency    Social History   Socioeconomic History   Marital status: Married    Spouse name: Not on file   Number of children: Not on file   Years of education: Not on file   Highest education level: Not on file  Occupational History   Not on file  Tobacco Use   Smoking status: Former    Current packs/day: 0.00    Average packs/day: 0.3 packs/day    Types: Cigarettes    Quit date: 01/2015    Years since quitting: 9.9   Smokeless tobacco: Never   Tobacco comments:    3-4 cigs a day   Vaping Use   Vaping status: Never Used  Substance and Sexual Activity   Alcohol use: Not Currently    Alcohol/week: 4.0 standard drinks of alcohol    Types: 4 Glasses of wine per week   Drug use: No   Sexual activity: Not on file  Other Topics Concern   Not on file  Social History Narrative   Not on file   Social Drivers of Health   Tobacco Use: Medium Risk (01/07/2025)   Patient History    Smoking Tobacco Use: Former    Smokeless Tobacco Use: Never    Passive Exposure: Not on Actuary Strain: Not on file  Food Insecurity: No Food Insecurity (01/08/2025)   Epic    Worried About Programme Researcher, Broadcasting/film/video in the Last Year: Never true    Ran Out of Food in the Last Year: Never true  Transportation Needs: No Transportation Needs (01/08/2025)   Epic    Lack of Transportation (Medical): No    Lack of Transportation (Non-Medical):  No  Physical Activity: Not on file  Stress: Not on file  Social Connections: Not on file  Depression (PHQ2-9): Low Risk (12/14/2024)   Depression (PHQ2-9)    PHQ-2 Score: 2  Recent Concern: Depression (PHQ2-9) - Medium Risk (09/28/2024)   Depression (PHQ2-9)    PHQ-2 Score: 5  Alcohol Screen: Not on file  Housing: Low Risk (01/08/2025)   Epic    Unable to Pay for Housing in the Last Year: No    Number of Times Moved in the Last Year: 0    Homeless in the Last Year: No  Utilities: Not At Risk (01/08/2025)   Epic    Threatened with loss of utilities: No  Health Literacy: Not on file   Family History  Problem Relation Age of Onset   Breast cancer Mother 41   Colon polyps Mother    Colon cancer Neg Hx  Rectal cancer Neg Hx    Stomach cancer Neg Hx    Esophageal cancer Neg Hx    Scheduled Meds:  apixaban   5 mg Oral BID   Chlorhexidine  Gluconate Cloth  6 each Topical Daily   feeding supplement  237 mL Oral BID BM   potassium chloride   40 mEq Oral Once   sodium chloride  flush  10-40 mL Intracatheter Q12H   Continuous Infusions:  cefTRIAXone  (ROCEPHIN )  IV 1 g (01/09/25 0649)   PRN Meds:.bisacodyl , metoCLOPramide  (REGLAN ) injection, oxyCODONE , prochlorperazine , sodium chloride  flush Medications Prior to Admission:  Prior to Admission medications  Medication Sig Start Date End Date Taking? Authorizing Provider  apixaban  (ELIQUIS ) 5 MG TABS tablet Take 1 tablet (5 mg total) by mouth 2 (two) times daily. 08/31/24  Yes [name], MD  cyanocobalamin (VITAMIN B12) 500 MCG tablet Take 500 mcg by mouth daily.   Yes [provider]  metoCLOPramide  (REGLAN ) 10 MG tablet Take 1 tablet (10 mg total) by mouth every 8 (eight) hours as needed for nausea. 11/02/24  Yes [name], MD  ondansetron  (ZOFRAN ) 8 MG tablet Take 1 tablet (8 mg total) by mouth every 8 (eight) hours as needed for nausea or vomiting. 10/26/24  Yes [name], MD  potassium chloride  SA (KLOR-CON  M) 20 MEQ tablet Take  1 tablet (20 mEq total) by mouth 2 (two) times daily. 11/02/24  Yes [name], MD  prochlorperazine  (COMPAZINE ) 10 MG tablet 1 tablet as needed for nausea or vomiting Orally every 6 hours   Yes [provider]  cetirizine (ZYRTEC) 10 MG chewable tablet Chew 10 mg by mouth daily. Patient not taking: Reported on 01/08/2025    [provider]   Allergies[1] Review of Systems + weakness  Physical Exam Appears weak and tired Has PICC line left upper extremity No peripheral edema Abdomen soft nontender Flat affect Awake alert oriented  Vital Signs: BP 107/69 (BP Location: Right Arm)   Pulse (!) 131   Temp 98.2 F (36.8 C) (Oral)   Resp (!) 21   Ht 5' 3 (1.6 m)   Wt 65.5 kg   LMP 03/12/2015   SpO2 100%   BMI 25.58 kg/m  Pain Scale: 0-10   Pain Score: Asleep   SpO2: SpO2: 100 % O2 Device:SpO2: 100 % O2 Flow Rate: .   IO: Intake/output summary: No intake or output data in the 24 hours ending 01/09/25 1236  LBM: Last BM Date : 01/03/25 Baseline Weight: Weight: 65.5 kg Most recent weight: Weight: 65.5 kg     Palliative Assessment/Data:   PPS 50%  Time In:  11 Time Out:  12 Time Total:  60  Greater than 50%  of this time was spent counseling and coordinating care related to the above assessment and plan.  Signed by: Lonia Serve, MD   Please contact Palliative Medicine Team phone at [phone] for questions and concerns.  For individual provider: See Amion                 [1]  Allergies Allergen Reactions   Irinotecan  Liposome Shortness Of Breath and Other (See Comments)    (Onivyde , given via IV) Pt had hypersensitivity rxn on 11/11/2023, see hypersensitivity rxn note for details. Pt had s/s of back pain, abdominal discomfort, SOB, flushing, and hypotension.    "

## 2025-01-10 DIAGNOSIS — Z7189 Other specified counseling: Secondary | ICD-10-CM

## 2025-01-10 DIAGNOSIS — E88A Wasting disease (syndrome) due to underlying condition: Secondary | ICD-10-CM

## 2025-01-10 DIAGNOSIS — C251 Malignant neoplasm of body of pancreas: Secondary | ICD-10-CM | POA: Diagnosis not present

## 2025-01-10 DIAGNOSIS — R638 Other symptoms and signs concerning food and fluid intake: Secondary | ICD-10-CM

## 2025-01-10 DIAGNOSIS — K5900 Constipation, unspecified: Secondary | ICD-10-CM

## 2025-01-10 DIAGNOSIS — Z6825 Body mass index (BMI) 25.0-25.9, adult: Secondary | ICD-10-CM

## 2025-01-10 DIAGNOSIS — E43 Unspecified severe protein-calorie malnutrition: Secondary | ICD-10-CM

## 2025-01-10 DIAGNOSIS — Z79899 Other long term (current) drug therapy: Secondary | ICD-10-CM

## 2025-01-10 DIAGNOSIS — G893 Neoplasm related pain (acute) (chronic): Secondary | ICD-10-CM

## 2025-01-10 LAB — COMPREHENSIVE METABOLIC PANEL WITH GFR
ALT: 18 U/L (ref 0–44)
AST: 48 U/L — ABNORMAL HIGH (ref 15–41)
Albumin: 2.5 g/dL — ABNORMAL LOW (ref 3.5–5.0)
Alkaline Phosphatase: 270 U/L — ABNORMAL HIGH (ref 38–126)
Anion gap: 9 (ref 5–15)
BUN: <5 mg/dL — ABNORMAL LOW (ref 8–23)
CO2: 25 mmol/L (ref 22–32)
Calcium: 8.1 mg/dL — ABNORMAL LOW (ref 8.9–10.3)
Chloride: 98 mmol/L (ref 98–111)
Creatinine, Ser: 0.36 mg/dL — ABNORMAL LOW (ref 0.44–1.00)
GFR, Estimated: >60 mL/min
Glucose, Bld: 106 mg/dL — ABNORMAL HIGH (ref 70–99)
Potassium: 3.4 mmol/L — ABNORMAL LOW (ref 3.5–5.1)
Sodium: 132 mmol/L — ABNORMAL LOW (ref 135–145)
Total Bilirubin: 1.3 mg/dL — ABNORMAL HIGH (ref 0.0–1.2)
Total Protein: 5 g/dL — ABNORMAL LOW (ref 6.5–8.1)

## 2025-01-10 LAB — CBC
HCT: 24.6 % — ABNORMAL LOW (ref 36.0–46.0)
Hemoglobin: 7.9 g/dL — ABNORMAL LOW (ref 12.0–15.0)
MCH: 25.6 pg — ABNORMAL LOW (ref 26.0–34.0)
MCHC: 32.1 g/dL (ref 30.0–36.0)
MCV: 79.9 fL — ABNORMAL LOW (ref 80.0–100.0)
Platelets: 141 K/uL — ABNORMAL LOW (ref 150–400)
RBC: 3.08 MIL/uL — ABNORMAL LOW (ref 3.87–5.11)
RDW: 17.4 % — ABNORMAL HIGH (ref 11.5–15.5)
WBC: 12.4 K/uL — ABNORMAL HIGH (ref 4.0–10.5)
nRBC: 0 % (ref 0.0–0.2)

## 2025-01-10 MED ORDER — BISACODYL 10 MG RE SUPP: 10.0000 mg | Freq: Every day | RECTAL | Status: DC | PRN

## 2025-01-10 MED ORDER — POTASSIUM CHLORIDE CRYS ER 20 MEQ PO TBCR
40.0000 meq | EXTENDED_RELEASE_TABLET | Freq: Once | ORAL | Status: DC
  Filled 2025-01-10: qty 2

## 2025-01-10 MED ORDER — PREGABALIN 25 MG PO CAPS
25.0000 mg | ORAL_CAPSULE | Freq: Two times a day (BID) | ORAL | Status: DC
  Administered 2025-01-10 – 2025-01-15 (×10): 25 mg via ORAL
  Filled 2025-01-10 (×10): qty 1

## 2025-01-10 MED ORDER — POTASSIUM CHLORIDE 10 MEQ/50ML IV SOLN
10.0000 meq | INTRAVENOUS | Status: AC
  Administered 2025-01-10 (×4): 10 meq via INTRAVENOUS
  Filled 2025-01-10 (×4): qty 50

## 2025-01-10 MED ORDER — SODIUM CHLORIDE 0.9 % IV SOLN: INTRAVENOUS | Status: DC

## 2025-01-10 MED ORDER — SENNA 8.6 MG PO TABS
2.0000 | ORAL_TABLET | Freq: Two times a day (BID) | ORAL | Status: DC
  Administered 2025-01-10 – 2025-01-14 (×5): 17.2 mg via ORAL
  Filled 2025-01-10 (×10): qty 2

## 2025-01-10 MED ORDER — METOCLOPRAMIDE HCL 5 MG/ML IJ SOLN
10.0000 mg | Freq: Three times a day (TID) | INTRAMUSCULAR | Status: DC
  Administered 2025-01-10 – 2025-01-12 (×6): 10 mg via INTRAVENOUS
  Filled 2025-01-10 (×6): qty 2

## 2025-01-10 NOTE — Progress Notes (Addendum)
 Ashley Clark   DOB:11-16-1961   FM#:[id number]      CLINICAL SUMMARY:  Ashley Clark is a 64 year old female patient admitted on 01/08/2025 with complaints of generalized weakness weight loss, and inability to take oral.  Oncologic history significant for pancreatic cancer.  Medical oncology following.    ASSESSMENT & PLAN:  Generalized weakness Dysphagia with weight loss Back pain Failure to thrive - Secondary to malignancy and oncologic therapy - Continue IV antiemetics, continue supportive care -Palliative following  Pancreatic cancer with liver mets, stage IV - Diagnosed 10/2023 - Status post first-line chemotherapy Nalirifox with subsequent dose reduction due to poor performance status. - CT imaging 10/19/2024 showed progression of disease.  Therefore chemotherapy regimen was changed to gemcitabine  and Abraxane  on 11/02/2024. - Medical oncology/Dr. Lanny following closely and will make further recommendations.  Neuropathy - Chemotherapy-induced - Palliative following, plans for Lyrica  - Will continue to follow closely  Anemia - Hemoglobin low 7.9.  Baseline hemoglobin in the 10-11 range. - Recommend PRBC transfusion for hemoglobin <7.0 - Need to monitor CBC with differential  History of breast cancer - No intervention at this time      Code Status Full   Subjective:  Patient seen awake and alert laying in bed.  Family member at bedside.  Admits to constipation with last bowel movement 1 week ago.  Admits to neuropathy bilateral feet and hands with hands being worse.  Reports pain is better since she has started Dilaudid .  No other acute complaints offered.  Objective:   Intake/Output Summary (Last 24 hours) at 01/10/2025 1000 Last data filed at 01/10/2025 0348 Gross per 24 hour  Intake 200.11 ml  Output --  Net 200.11 ml     PHYSICAL EXAMINATION: ECOG PERFORMANCE STATUS: 3 - Symptomatic, >50% confined to bed  Vitals:   01/10/25 0036 01/10/25 0533   BP: 109/62 102/76  Pulse: (!) 110 (!) 108  Resp: 19   Temp: 98.4 F (36.9 C) 98.3 F (36.8 C)  SpO2: 99% 99%   Filed Weights   01/08/25 0457  Weight: 144 lb 6.4 oz (65.5 kg)    GENERAL: alert, no distress and comfortable + chronically ill-appearing SKIN: skin color, texture, turgor are normal, no rashes or significant lesions EYES: normal, conjunctiva are pink and non-injected, sclera clear OROPHARYNX: no exudate, no erythema and lips, buccal mucosa, and tongue normal  NECK: supple, thyroid normal size, non-tender, without nodularity LYMPH: no palpable lymphadenopathy in the cervical, axillary or inguinal LUNGS: clear to auscultation and percussion with normal breathing effort HEART: regular rate & rhythm and no murmurs and no lower extremity edema ABDOMEN: abdomen soft, non-tender and normal bowel sounds MUSCULOSKELETAL: no cyanosis of digits and no clubbing  PSYCH: alert & oriented x 3 with fluent speech NEURO: no focal motor/sensory deficits   All questions were answered. The patient knows to call the clinic with any problems, questions or concerns.   I personally spent a total of 40 minutes minutes in the care of the patient today including preparing to see the patient, performing a medically appropriate exam/evaluation, referring and communicating with other health care professionals, documenting clinical information in the EHR, communicating results, and coordinating care.    Olam PARAS Rouson, NP 01/10/2025 10:00 AM    Labs Reviewed:  Lab Results  Component Value Date   WBC 12.4 (H) 01/10/2025   HGB 7.9 (L) 01/10/2025   HCT 24.6 (L) 01/10/2025   MCV 79.9 (L) 01/10/2025   PLT 141 (L) 01/10/2025  Recent Labs    01/07/25 2149 01/09/25 0342 01/10/25 0048  NA 128* 130* 132*  K 3.6 3.4* 3.4*  CL 92* 97* 98  CO2 25 24 25   GLUCOSE 105* 131* 106*  BUN 7* <5* <5*  CREATININE 0.42* 0.30* 0.36*  CALCIUM  9.0 8.1* 8.1*  GFRNONAA >60 >60 >60  PROT 5.9* 4.8* 5.0*   ALBUMIN 3.0* 2.4* 2.5*  AST 49* 37 48*  ALT 26 16 18   ALKPHOS 283* 215* 270*  BILITOT 1.6* 1.1 1.3*    Studies Reviewed:   DG Fluoro Rm 1-60 Min - No Report Result Date: 01/09/2025 Fluoroscopy was utilized by the requesting physician.  No radiographic interpretation.   DG Fluoro Rm 1-60 Min - No Report Result Date: 01/09/2025 Fluoroscopy was utilized by the requesting physician.  No radiographic interpretation.   DG Fluoro Rm 1-60 Min - No Report Result Date: 01/09/2025 Fluoroscopy was utilized by the requesting physician.  No radiographic interpretation.   DG Fluoro Rm 1-60 Min - No Report Result Date: 01/09/2025 Fluoroscopy was utilized by the requesting physician.  No radiographic interpretation.   DG Fluoro Rm 1-60 Min - No Report Result Date: 01/09/2025 Fluoroscopy was utilized by the requesting physician.  No radiographic interpretation.   DG Fluoro Rm 1-60 Min - No Report Result Date: 01/09/2025 Fluoroscopy was utilized by the requesting physician.  No radiographic interpretation.   DG Fluoro Rm 1-60 Min - No Report Result Date: 01/09/2025 Fluoroscopy was utilized by the requesting physician.  No radiographic interpretation.   CT CHEST ABDOMEN PELVIS W CONTRAST Result Date: 01/06/2025 EXAM: CT CHEST, ABDOMEN AND PELVIS WITH CONTRAST 12/21/2024 01:44:35 PM TECHNIQUE: CT of the chest, abdomen and pelvis was performed with the administration of 100 mL of iohexol  (OMNIPAQUE ) 300 MG/ML solution. Multiplanar reformatted images are provided for review. Automated exposure control, iterative reconstruction, and/or weight based adjustment of the mA/kV was utilized to reduce the radiation dose to as low as reasonably achievable. COMPARISON: CT chest, abdomen and pelvis 10/19/2024. CLINICAL HISTORY: Pancreatic cancer, assess treatment response. * Tracking Code: BO * FINDINGS: CHEST: MEDIASTINUM AND LYMPH NODES: Left PICC terminates at the cavoatrial junction. Heart and pericardium are  unremarkable. The central airways are clear. No mediastinal, hilar or axillary lymphadenopathy. LUNGS AND PLEURA: Tiny solid 0.3 cm posterior left lower lobe nodule on series 10, image 66 is new. Tiny perifissural 0.3 cm posterior right upper lobe nodule along the major fissure on image 40 is stable. No acute consolidative airspace disease. No additional significant pulmonary nodules. No pleural effusion. No pneumothorax. ABDOMEN AND PELVIS: LIVER: Numerous (greater than 15) hypoenhancing bulky liver masses scattered throughout the liver, increased in size and number. Representative dominant 9.8 x 6.1 cm left liver mass on series 7, image 104, increased from 4.9 x 3.6 cm. Representative new 4.0 x 3.9 cm superior right liver mass on image 73. Representative new 1.3 x 1.1 cm inferior right liver mass on image 106. GALLBLADDER AND BILE DUCTS: Unremarkable. No biliary ductal dilatation. SPLEEN: No acute abnormality. PANCREAS: Hypoenhancing pancreatic body 1.5 x 1.2 cm mass on image 109 is stable. Stable atrophy and mild ductal dilation in the pancreatic tail. ADRENAL GLANDS: No acute abnormality. KIDNEYS, URETERS AND BLADDER: No stones in the kidneys or ureters. No hydronephrosis. No perinephric or periureteral stranding. Urinary bladder is unremarkable. GI AND BOWEL: Stomach demonstrates no acute abnormality. Normal caliber small bowel. Normal appendix. No small or large bowel wall thickening. No significant colonic diverticulosis. REPRODUCTIVE ORGANS: No acute abnormality. PERITONEUM AND  RETROPERITONEUM: Small volume pelvic ascites is new. No free air. VASCULATURE: Dilated main pulmonary artery (3.6 cm diameter). Normal course and caliber of the thoracic aorta. Abdominal aorta is normal in caliber. 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 ABDOMINAL AND PELVIS LYMPH NODES: Progressive retroperitoneal adenopathy. Representative newly enlarged 1.0 cm porta hepatis  node on image 87, newly enlarged 1.4 cm portacaval node on image 105, newly enlarged left paraaortic nodes up to 1.0 cm on image 122 and newly enlarged left common iliac nodes up to 1.0 cm on image 131. BONES AND SOFT TISSUES: Mild thoracic spondylosis. Minimal lumbar spondylosis. No acute osseous abnormality. No focal soft tissue abnormality. IMPRESSION: 1. Progression of metastatic disease with significantly increased size and number of liver metastases. 2. Progressive retroperitoneal and left common iliac metastatic adenopathy. 3. New tiny 3 mm solid left lower lobe pulmonary nodule suspicious for metastasis. 4. New small volume pelvic ascites. 5. Stable 1.5 cm pancreatic body mass with stable pancreatic tail atrophy and mild pancreatic ductal dilatation. 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 6. Dilated main pulmonary artery, which can be seen with pulmonary hypertension. Electronically signed by: Selinda Blue MD MD 01/06/2025 09:36 AM EST RP Workstation: [id number]   Addendum I have seen the patient, examined her. I agree with the assessment and and plan and have edited the notes.   Patient is well-known to me, under my care for her metastatic pancreatic cancer.  She is currently on second line chemotherapy, unfortunately cancer significantly progressed based on the CT scan from January 06, 2025.  I think most of her symptoms is related to her metastatic cancer, especially high burden in the liver.  However I do want to ruled out gastric outlet obstruction, which potentially can from her pancreatic cancer and/or liver metastasis.  She underwent diagnostic swallowing evaluation yesterday, the report still pending.  If not conclusive, please repeat the CT abdomen.  She unfortunately is not a candidate for more chemotherapy, will see if the anything treatable/reversible if she has gastric outlet obstruction.  Otherwise we are more looking at hospice care and supportive measurements.   I discussed the CODE STATUS with her, she is on board with DNR, her family (mother and son) were at the bedside when I spoke with her.  She states her husband is also aware of her DNR.  All questions were answered.  I spent a total of 50 minutes for visit today.  Will continue following up.  Onita Mattock MD 01/10/2025

## 2025-01-10 NOTE — Progress Notes (Signed)
 " Daily Progress Note   Patient Name: Ashley Clark       Date: 01/10/2025 DOB: 11/08/1961  Age: 64 y.o. MRN#: [id number] Attending Physician: Jonel Lonni SQUIBB, * Primary Care Physician: [name], PA Admit Date: 01/07/2025 Length of Stay: 2 days  Reason for Follow-up: Establishing goals of care, Non pain symptom management, and Pain control  Subjective:   CC: Patient noting difficulty eating.  Following up regarding symptom management and goals for medical care.  Reviewed EMR including documentation from hospitalist on 01/09/2025 and documentation from oncology on 01/10/2025.  Hospitalist noted patient had upper GI series with small bowel follow-through on 01/09/2025; report still pending at time of EMR review.  Oncology was consulted for recommendations.  Oncology note from today evaluating today to determine further recommendations regarding cancer directed therapies.  Patient's last chemotherapy regimen was changed on 11/02/2024 after prior CT in October had noted progression of disease. Reviewed CMP from today noting patient's estimated GFR is >60 to assist with providing medication recommendations.  CMP also showed patient's albumin low at 2.5 and total bilirubin slightly elevated at 1.3.  Also reviewed CBC noting patient's leukocytosis elevated at 12.4 and thrombocytopenia of 141. At time of EMR review in past 24 hours patient has received as needed p.o. oxycodone  5 mg x 3 doses and as needed IV Dilaudid  1 mg x 2 doses.  Patient has not received any as needed Reglan . Personally reviewed EKG obtained on 01/09/2025 which noted QTc 437.  Presented to bedside to see patient.  Patient laying in bed.  During visit, family member joined at bedside and patient gave permission to continue conversation.  Introduced myself as a member of the palliative medicine team.  Able to follow-up on patient's symptoms at this time. Patient describes her primary pain being in her lower extremities  bilaterally.  Patient describes the pain as shooting and sometimes sharp down her legs.  Patient specifically notes it feels like neuropathy.  Patient noted she had previously taken gabapentin  though did not notice relief so stopped taking it.  Patient unsure of the dose of gabapentin  she had previously been receiving.  Spent time reviewing that based on patient's description of neuropathic pain, should use medications aimed at that type of pain as opioids are not specific for neuropathic pain.  Discussed restarting gabapentin  and adjusting dose versus Lyrica .  Patient requesting to start Lyrica  to see if this will help with neuropathic pain.  Agreed and noted would start at low-dose.  Continue to monitor for any adverse effects. Patient also describes difficulties with eating.  Patient very frustrated by this as she is hungry though feels that food gets stuck in her throat and stomach and cannot go down.  Noted currently awaiting upper GI series with small bowel follow-through from 01/09/2025 for further evaluation.  Did inquire if consistencies make a difference and patient noted that this can help him with liquids or solids.  Inquired if patient has previously had any relief with medications.  Patient noted that she has previously taken Zofran  though even when taking this medication she can only get minimal relief.  Inquired if patient had previously taken Reglan  and patient noted that she did though it was only once a day, not scheduled regularly throughout the day.  Based on patient's description while awaiting GI imaging, concerned about delays in peristalsis.  Discussed starting patient on scheduled Reglan  to determine if this would help with symptom burden.  Patient agreement with this plan. Patient also noted  that she has not had a bowel movement since last Tuesday.  Discussed adjustment of bowel regimen to assist with management of this.  During conversation, oncology NP presented to bedside to  evaluate patient.  Discussed with patient initiating medications for symptom management and would continue discussions moving forward based off of oncology's input.  All questions answered at that time.  Noted palliative medicine team continuing to follow with patient's medical journey.  Objective:   Vital Signs:  BP 102/76   Pulse (!) 108   Temp 98.3 F (36.8 C) (Oral)   Resp 19   Ht 5' 3 (1.6 m)   Wt 65.5 kg   LMP 03/12/2015   SpO2 99%   BMI 25.58 kg/m   Physical Exam: General: NAD, alert, chronically ill-appearing Cardiovascular: Tachycardia noted Respiratory: no increased work of breathing noted, not in respiratory distress Neuro: A&Ox4, following commands easily Psych: appropriately answers all questions  Assessment & Plan:   Assessment: Patient is a 64 year old female with a past medical history of metastatic pancreatic cancer with disease to the liver, diabetes mellitus type 2, hypertension, history of PE on Eliquis , and prior breast cancer status postlumpectomy who was admitted on 01/07/2025 for progressive weakness and lack of p.o. intake.  Since admission, patient has received management for dysphagia and electrolyte abnormalities.  Palliative medicine team consulted to assist with goals of care and symptom management.  Recommendations/Plan: # Complex medical decision making/goals of care:  - Currently continuing appropriate medical interventions.  Oncologist planning to follow-up with patient today and would determine recommendations regarding cancer directed therapy.  Palliative medicine team continuing to follow along with patient's medical journey.  -  Code Status: Full Code  # Symptom management: Patient is receiving these palliative interventions for symptom management with an intent to improve quality of life.    - Pain, acute on chronic in setting of metastatic pancreatic cancer with disease to the liver When patient describing pain today, describing more  neuropathic aspects of pain.  Could be chemotherapy related.  Patient feels like she has not previously gotten relief from gabapentin  so would like to try pregabalin  instead.   - Start pregabalin  25 mg twice daily   - Continue oxycodone  5 mg every 4 hours as needed   - Continue IV hydromorphone  1 mg every 4 hours as needed breakthrough pain after oral opioid   - Poor oral intake/dysphagia with weight loss/severe protein calorie malnutrition/cancer associated cachexia Noted concerns for gastroparesis.  Awaiting GI study.  Hospitalist will consult GI depending on study results. Personally reviewed EKG from 01/09/2025 noting QTc 437.   - Start IV Reglan  10 mg every 8 hours   - Constipation   - Start senna 2 tab twice daily   - Start bisacodyl  10 mg suppository daily as needed  # Psychosocial Support:  - Significant other: Jerona, Mother: Orlean  # Discharge Planning: To Be Determined  Discussed with: Patient, hospitalist, RN, oncologist  Thank you for allowing the palliative care team to participate in the care Ashley Clark.  Tinnie Radar, DO Palliative Care Provider PMT # [phone]  If patient remains symptomatic despite maximum doses, please call PMT at [phone] between 0700 and 1900. Outside of these hours, please call attending, as PMT does not have night coverage.  Billing based on MDM: Moderate  Problems Addressed: One or more chronic illnesses with severe exacerbation, progression, or side effects of treatment.  Amount and/or Complexity of Data: Category 3:Discussion of management or test interpretation with external  physician/other qualified health care professional/appropriate source (not separately reported)  Risks: Prescription drug management  "

## 2025-01-10 NOTE — Progress Notes (Signed)
" °  Progress Note   Patient: Ashley Clark FMW:[id number] DOB: 20-Apr-1961 DOA: 01/07/2025     2 DOS: the patient was seen and examined on 01/10/2025        Brief hospital course: 64 y.o. 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     Assessment and Plan: Severe protein calorie malnutrition Weight loss Dysphagia Symptoms of fullness and abdominal discomfort after eating, slowly progressive over months.  Worse with most recent chemo.  Minimal oral intake, has lost 50 lbs in last 6 months.     Here, given subjective obstruction in stomach, patient underwent upper GI series, small bowel follow-through today, report pending.  She relates that there was no obstruction on exam. - Follow-up UGI, SB follow-through, performed yesterday, read still pending - If UGI series negative, Oncology recommend CT abdomen and pelvis to rule out GOO  - Continue Reglan , Compazine  - CLD - Consult palliative care, oncology   Acute cystitis versus asymptomatic pyuria Symptoms resolved.  Urine culture contaminated. - Continue Rocephin , day 4   Acute on chronic hypotension Blood pressure resolved to baseline 90/60.   Hypokalemia Hyponatremia Hyponatremia asymptomatic - Supplement potassium   Microcytic anemia Hemoglobin dropped after admission, stable today, no clinical bleeding, suspect this is dilution - Trend Hgb   Adenocarcinoma of head of the pancreas, metastatic to liver, lung, and lymph node - Consult palliative care and oncology          Subjective: In a lot of pain.  No fever, no respiratory distress.     Physical Exam: BP 113/62 (BP Location: Right Arm)   Pulse (!) 109   Temp 98.9 F (37.2 C) (Oral)   Resp 18   Ht 5' 3 (1.6 m)   Wt 65.5 kg   LMP 03/12/2015   SpO2 100%   BMI 25.58 kg/m   General: Pt is oriented, awake, in a lot of  pain Cardiovascular: Tachycardic, regular, nl S1-S2, no murmurs appreciated.   No LE edema.   Respiratory: Normal respiratory rate and rhythm.  CTAB without rales or wheezes. Neuro/Psych: Strength symmetric in upper and lower extremities.  Judgment and insight appear normal.   Data Reviewed: Discussed with hematology and Palliative care BMP shows hyponatremia, hypokalemia, normal renal function CBC shows stbale anemia, leukocytosis       Disposition: Status is: Inpatient 64 y.o. F with progressive cancer, now with inability to eat.  Awaiting read of UGI series, if normal, will obtain CT abdomen.   Palliative and Oncology consulted for goals of care        Author: Lonni SHAUNNA Dalton, MD 01/10/2025 5:48 PM  For on call review [email or web address].    "

## 2025-01-10 NOTE — Plan of Care (Signed)

## 2025-01-10 NOTE — Plan of Care (Signed)
  Problem: Education: Goal: Knowledge of General Education information will improve Description: Including pain rating scale, medication(s)/side effects and non-pharmacologic comfort measures Outcome: Progressing   Problem: Health Behavior/Discharge Planning: Goal: Ability to manage health-related needs will improve Outcome: Progressing   Problem: Clinical Measurements: Goal: Ability to maintain clinical measurements within normal limits will improve Outcome: Progressing Goal: Will remain free from infection Outcome: Progressing Goal: Diagnostic test results will improve Outcome: Progressing Goal: Respiratory complications will improve Outcome: Progressing Goal: Cardiovascular complication will be avoided Outcome: Progressing   Problem: Coping: Goal: Level of anxiety will decrease Outcome: Progressing   Problem: Elimination: Goal: Will not experience complications related to bowel motility Outcome: Progressing Goal: Will not experience complications related to urinary retention Outcome: Progressing   Problem: Pain Managment: Goal: General experience of comfort will improve and/or be controlled Outcome: Progressing   Problem: Safety: Goal: Ability to remain free from injury will improve Outcome: Progressing   Problem: Skin Integrity: Goal: Risk for impaired skin integrity will decrease Outcome: Progressing

## 2025-01-10 NOTE — Assessment & Plan Note (Signed)
-  stage IV with liver mets, MSS, KRAS G12R mutation (+) -diagnosed in 10/2023 -Patient has a history of breast cancer. She presents with dyspnea, chest discomfort, decreased appetite, and weight loss. She also reports new onset back pain. -CT showed a 2.9cm mass in pancreatic neck/body, and multiple liver mets, liver biopsy confirmed adenocarcinoma  -I reviewed the aggressive nature of pancreatic cancer, and the incurable nature of her disease due to diffuse liver metastasis. -she start first line chemo Nalirifox chemotherapy regimen on 11/12, with dose reduction for first cycle due to poor PS -Performed molecular testing on tumor tissue to identify potential targeted therapy options, unfortunately no actionable mutations detected  -She underwent genetic testing and came back negative  -restaging CT 04/20/2024 showed mild improvement, chemo changed to 5-fu and liposomal irinotecan  in April 2025 -CT 07/28/2024 showed overall stable disease, but unfortunately her cancer progressed in liver on CT 10/19/2024 -chemo changed to gemcitabine  and abraxane  on 11/02/2024

## 2025-01-11 ENCOUNTER — Inpatient Hospital Stay

## 2025-01-11 ENCOUNTER — Inpatient Hospital Stay: Admitting: Hematology

## 2025-01-11 DIAGNOSIS — R933 Abnormal findings on diagnostic imaging of other parts of digestive tract: Secondary | ICD-10-CM

## 2025-01-11 DIAGNOSIS — B3781 Candidal esophagitis: Secondary | ICD-10-CM | POA: Diagnosis not present

## 2025-01-11 DIAGNOSIS — Z7901 Long term (current) use of anticoagulants: Secondary | ICD-10-CM | POA: Diagnosis not present

## 2025-01-11 DIAGNOSIS — R1310 Dysphagia, unspecified: Secondary | ICD-10-CM | POA: Diagnosis not present

## 2025-01-11 DIAGNOSIS — Z7189 Other specified counseling: Secondary | ICD-10-CM

## 2025-01-11 DIAGNOSIS — G620 Drug-induced polyneuropathy: Secondary | ICD-10-CM

## 2025-01-11 DIAGNOSIS — C799 Secondary malignant neoplasm of unspecified site: Secondary | ICD-10-CM

## 2025-01-11 DIAGNOSIS — R634 Abnormal weight loss: Secondary | ICD-10-CM

## 2025-01-11 DIAGNOSIS — R1319 Other dysphagia: Secondary | ICD-10-CM | POA: Diagnosis not present

## 2025-01-11 DIAGNOSIS — Z66 Do not resuscitate: Secondary | ICD-10-CM

## 2025-01-11 DIAGNOSIS — C259 Malignant neoplasm of pancreas, unspecified: Secondary | ICD-10-CM | POA: Diagnosis not present

## 2025-01-11 MED ORDER — SODIUM CHLORIDE 0.9 % IV SOLN: INTRAVENOUS | Status: AC

## 2025-01-11 NOTE — H&P (View-Only) (Signed)
 "      Consultation  Referring Provider:  [facility]  Primary Care Physician:  [name], [name], PA Primary Gastroenterologist:  Dr. Charlanne       Reason for Consultation:     Dysphagia and weight loss  LOS: 3 days          HPI:   Ashley Clark is a 64 y.o. 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 Reports unintentional weight loss of 50 pounds in the last 6 months, she feels this is more due to her cancer/chemo than dysphagia. She reports dysphagia to solids/liquids ongoing since thanksgiving in which each time  she attempts PO intake she reports it feeling stuck in her suprasternal notch. Denies pain, GERD, nausea, vomiting. No previous EGD  Underwent upper GI series 01/09/2025 which showed: 13 mm barium tablet became stuck in the upper thoracic esophagus, without obvious focal narrowing or stricture. Contrast was seen opacifying the ascending colon, no evidence of obstruction.   Workup notable for - CT chest abdomen pelvis with contrast with progressive liver metastasis, progressive retroperitoneal and left common iliac metastatic adenopathy, new 3 mm solid left lower lobe pulmonary nodule suspicious for metastasis, new small volume pelvic ascites, stable 1.5 cm pancreatic body mass with stable pancreatic tail atrophy and mild pancreatic ductal dilation.  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  Dilated main pulmonary artery. - Hgb 7.9 (down from 10.7 2 weeks ago) - Platelets 141 - WBC 12.4, uptrending - AST 48/alk phos 270/total bilirubin 1.3 - CR 0.36 - Albumin 2.4 - Potassium 3.4, sodium 130 - INR 2.0, PT 23.9 - CA [id number] [id number]    PREVIOUS GI WORKUP   Colonoscopy 2024 with two sessile polyps  UGIS  1/12 UGIS: CLINICAL DATA: Decreased appetite, weight loss, nausea, vomiting, malnutrition worsening over 1 year. Concern for possible intestinal obstruction. Request for upper GI series with small-bowel follow-through for further evaluation.  EXAM: UPPER GI SERIES WITH SMALL BOWEL FOLLOW-THROUGH FLUOROSCOPY: Radiation Exposure Index (as provided by the fluoroscopic device): 2.00 mGy Kerma TECHNIQUE: Combined double contrast and single contrast upper GI series using thin barium. Subsequently, serial images of the small bowel were obtained including spot views of the terminal ileum. This exam was performed by Kimble Clas, PA-C , and was supervised and interpreted by Dr. MARLA Kilts .   COMPARISON: None Available. FINDINGS: Esophagus: Normal appearance. Esophageal motility: Within normal limits. Gastroesophageal reflux: None visualized. Ingested 13 mm barium tablet: Became stuck in the upper thoracic esophagus, despite multiple sips of water and thin barium. No focal narrowing or obvious stricture seen. Stomach: Normal appearance. No hiatal hernia. Gastric emptying: Normal. Duodenum: Normal appearance. Other: Contrast seen opacifying the proximal ascending colon. No evidence of obstruction.   IMPRESSION: 13 mm barium tablet became stuck in the upper thoracic esophagus, without obvious focal narrowing or stricture. Contrast was seen opacifying the ascending colon, no evidence of obstruction.  so  Past Medical History:  Diagnosis Date   Acute pulmonary embolism (HCC) 10/29/2023   Allergy    Back pain    Cancer (HCC) 2019   left breast cancer-last radiation Dec.2019   Diabetes mellitus without complication [facility])    Medical history non-contributory    Personal history of radiation therapy    Port-A-Cath in place - removed  12-11-2023 due to port-a-cath infection. 11/13/2023   Prediabetes    Tiredness    Vitamin D  deficiency     Surgical History:  She  has a past surgical history that includes  Wisdom tooth extraction; Breast lumpectomy with radioactive seed and sentinel lymph node biopsy (Left, 09/29/2018); Colonoscopy (2016); Polypectomy; Breast lumpectomy (Left); Breast biopsy (Left, 08/04/2018); IR IMAGING GUIDED PORT INSERTION (11/03/2023); IR REMOVAL TUN ACCESS W/ PORT W/O FL MOD SED (12/11/2023); IR Radiologist Eval & Mgmt (12/15/2023); IR PATIENT EVAL TECH 0-60 MINS (12/18/2023); IR PATIENT EVAL TECH 0-60 MINS (12/24/2023); IR PATIENT EVAL TECH 0-60 MINS (12/28/2023); IR PATIENT EVAL TECH 0-60 MINS (01/04/2024); IR PATIENT EVAL TECH 0-60 MINS (01/13/2024); IR PATIENT EVAL TECH 0-60 MINS (01/22/2024); and IR CV Line Injection (11/30/2024). Family History:  Her family history includes Breast cancer (age of onset: 29) in her mother; Colon polyps in her mother. Social History:   reports that she quit smoking about 9 years ago. Her smoking use included cigarettes. She smoked an average of 0.3 packs per day. She has never used smokeless tobacco. She reports that she does not currently use alcohol after a past usage of about 4.0 standard drinks of alcohol per week. She reports that she does not use drugs.  Prior to Admission medications  Medication Sig Start Date End Date Taking? Authorizing Provider  apixaban  (ELIQUIS ) 5 MG TABS tablet Take 1 tablet (5 mg total) by mouth 2 (two) times daily. 08/31/24  Yes [name], MD  cyanocobalamin (VITAMIN B12) 500 MCG tablet Take 500 mcg by mouth daily.   Yes [provider]  metoCLOPramide  (REGLAN ) 10 MG tablet Take 1 tablet (10 mg total) by mouth every 8 (eight) hours as needed for nausea. 11/02/24  Yes [name], MD  ondansetron  (ZOFRAN ) 8 MG tablet Take 1 tablet (8 mg total) by mouth every 8 (eight) hours as needed for nausea or vomiting. 10/26/24  Yes [name], MD  potassium chloride  SA (KLOR-CON  M) 20 MEQ tablet Take 1 tablet (20 mEq total) by mouth 2 (two) times daily. 11/02/24  Yes [name], MD  prochlorperazine  (COMPAZINE ) 10 MG tablet 1 tablet  as needed for nausea or vomiting Orally every 6 hours   Yes [provider]  cetirizine (ZYRTEC) 10 MG chewable tablet Chew 10 mg by mouth daily. Patient not taking: Reported on 01/08/2025    [provider]    Current Facility-Administered Medications  Medication Dose Route Frequency Provider Last Rate Last Admin   0.9 %  sodium chloride  infusion   Intravenous Continuous Alto Isaiah CROME, NP 100 mL/hr at 01/11/25 0611 New Bag at 01/11/25 9388   apixaban  (ELIQUIS ) tablet 5 mg  5 mg Oral BID Dena Charleston, MD   5 mg at 01/11/25 0915   bisacodyl  (DULCOLAX) suppository 10 mg  10 mg Rectal Daily PRN Clayton Tinnie ORN, DO       Chlorhexidine  Gluconate Cloth 2 % PADS 6 each  6 each Topical Daily Dorrell, Robert, MD   6 each at 01/10/25 1038   feeding supplement (ENSURE PLUS HIGH PROTEIN) liquid 237 mL  237 mL Oral BID BM Danford, Lonni SQUIBB, MD   237 mL at 01/11/25 0915   HYDROmorphone  (DILAUDID ) injection 1 mg  1 mg Intravenous Q4H PRN Jonel Lonni SQUIBB, MD   1 mg at 01/10/25 1753   metoCLOPramide  (REGLAN ) injection 10 mg  10 mg Intravenous Q8H Mims, Lauren W, DO   10 mg at 01/11/25 0608   oxyCODONE  (Oxy IR/ROXICODONE )  immediate release tablet 5 mg  5 mg Oral Q4H PRN Jonel Lonni SQUIBB, MD   5 mg at 01/09/25 1749   pregabalin  (LYRICA ) capsule 25 mg  25 mg Oral BID Clayton Tinnie ORN, DO   25 mg at 01/11/25 0915   prochlorperazine  (COMPAZINE ) injection 10 mg  10 mg Intravenous Q4H PRN Jonel Lonni SQUIBB, MD   10 mg at 01/11/25 0915   senna (SENOKOT) tablet 17.2 mg  2 tablet Oral BID Clayton Tinnie ORN, DO   17.2 mg at 01/11/25 0915   sodium chloride  flush (NS) 0.9 % injection 10-40 mL  10-40 mL Intracatheter Q12H Dena Charleston, MD   10 mL at 01/11/25 0915   sodium chloride  flush (NS) 0.9 % injection 10-40 mL  10-40 mL Intracatheter PRN Dena Charleston, MD        Allergies as of 01/07/2025 - Review Complete 01/07/2025  Allergen Reaction Noted   Irinotecan  liposome Shortness  Of Breath and Other (See Comments) 11/11/2023    Review of Systems  Constitutional:  Positive for malaise/fatigue and weight loss. Negative for chills and fever.  HENT:  Negative for hearing loss and tinnitus.   Eyes:  Negative for blurred vision and double vision.  Respiratory:  Negative for cough and hemoptysis.   Cardiovascular:  Negative for chest pain and palpitations.  Gastrointestinal:  Negative for abdominal pain, blood in stool, constipation, diarrhea, heartburn, melena, nausea and vomiting.  Genitourinary:  Negative for dysuria and urgency.  Musculoskeletal:  Negative for myalgias and neck pain.  Skin:  Negative for itching and rash.  Neurological:  Negative for seizures and loss of consciousness.  Psychiatric/Behavioral:  Negative for depression and suicidal ideas.        Physical Exam:  Vital signs in last 24 hours: Temp:  [98 F (36.7 C)-98.9 F (37.2 C)] 98 F (36.7 C) (01/14 0800) Pulse Rate:  [99-128] 107 (01/14 0800) Resp:  [18] 18 (01/14 0800) BP: (91-113)/(59-77) 113/77 (01/14 0800) SpO2:  [98 %-100 %] 100 % (01/14 0800) Last BM Date : 01/11/25 Last BM recorded by nurses in past 5 days No data recorded  Physical Exam Constitutional:      Appearance: She is ill-appearing.  HENT:     Head: Normocephalic and atraumatic.     Right Ear: There is no impacted cerumen.     Nose: Nose normal. No congestion.     Mouth/Throat:     Mouth: Mucous membranes are moist.     Pharynx: Oropharynx is clear. No oropharyngeal exudate.  Eyes:     General: No scleral icterus.    Extraocular Movements: Extraocular movements intact.  Cardiovascular:     Rate and Rhythm: Regular rhythm. Tachycardia present.  Pulmonary:     Effort: Pulmonary effort is normal. No respiratory distress.  Abdominal:     General: Bowel sounds are normal. There is no distension.     Palpations: Abdomen is soft. There is no mass.     Tenderness: There is no abdominal tenderness.     Hernia: No  hernia is present.  Musculoskeletal:        General: No swelling. Normal range of motion.  Skin:    General: Skin is warm and dry.  Neurological:     General: No focal deficit present.     Mental Status: She is oriented to person, place, and time.  Psychiatric:        Mood and Affect: Mood normal.        Behavior: Behavior normal.  Thought Content: Thought content normal.        Judgment: Judgment normal.      LAB RESULTS: Recent Labs    01/09/25 0342 01/10/25 0048  WBC 10.9* 12.4*  HGB 7.8* 7.9*  HCT 24.4* 24.6*  PLT 156 141*   BMET Recent Labs    01/09/25 0342 01/10/25 0048  NA 130* 132*  K 3.4* 3.4*  CL 97* 98  CO2 24 25  GLUCOSE 131* 106*  BUN <5* <5*  CREATININE 0.30* 0.36*  CALCIUM  8.1* 8.1*   LFT Recent Labs    01/10/25 0048  PROT 5.0*  ALBUMIN 2.5*  AST 48*  ALT 18  ALKPHOS 270*  BILITOT 1.3*   PT/INR No results for input(s): LABPROT, INR in the last 72 hours.  STUDIES: DG Fluoro Rm 1-60 Min - No Report Result Date: 01/09/2025 Fluoroscopy was utilized by the requesting physician.  No radiographic interpretation.   DG Fluoro Rm 1-60 Min - No Report Result Date: 01/09/2025 Fluoroscopy was utilized by the requesting physician.  No radiographic interpretation.   DG Fluoro Rm 1-60 Min - No Report Result Date: 01/09/2025 Fluoroscopy was utilized by the requesting physician.  No radiographic interpretation.   DG Fluoro Rm 1-60 Min - No Report Result Date: 01/09/2025 Fluoroscopy was utilized by the requesting physician.  No radiographic interpretation.   DG Fluoro Rm 1-60 Min - No Report Result Date: 01/09/2025 Fluoroscopy was utilized by the requesting physician.  No radiographic interpretation.   DG Fluoro Rm 1-60 Min - No Report Result Date: 01/09/2025 Fluoroscopy was utilized by the requesting physician.  No radiographic interpretation.   DG Fluoro Rm 1-60 Min - No Report Result Date: 01/09/2025 Fluoroscopy was utilized by the  requesting physician.  No radiographic interpretation.      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 DDX includes radiation induced stricture, candida esophagitis in the setting of chemo, dysmotility -- EGD, timing TBD (possibly tomorrow afternoon) -- hold Eliquis  -- continue supportive care  Coagulopathy possibly in the setting of progressive liver mets versus infection PT 23.9, INR 2.0 WBC 12.4  Pulmonary Embolism 2024 On eliquis , last dose 1/14 AM  Acute cystitis versus asymptomatic pyuria Symptoms resolved on Rocephin   Adenocarcinoma of head of pancreas with mets to liver, lung, lymph node Diagnosed 10/2023 follows outpatient with Dr. Lanny.  Negative genetic testing.  Undergoing chemo with discussion of referral to [facility] for clinical trials -Palliative care and oncology following  Breast cancer s/p lumpectomy History of radiation  Thank you for your kind consultation, we will continue to follow.   Spirit Wernli CHRISTELLA Blower  01/11/2025, 11:06 AM  "

## 2025-01-11 NOTE — Plan of Care (Signed)

## 2025-01-11 NOTE — Consult Note (Addendum)
 "      Consultation  Referring Provider:  [facility]  Primary Care Physician:  [name], [name], PA Primary Gastroenterologist:  Dr. Charlanne       Reason for Consultation:     Dysphagia and weight loss  LOS: 3 days          HPI:   Ashley Clark is a 64 y.o. 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 Reports unintentional weight loss of 50 pounds in the last 6 months, she feels this is more due to her cancer/chemo than dysphagia. She reports dysphagia to solids/liquids ongoing since thanksgiving in which each time  she attempts PO intake she reports it feeling stuck in her suprasternal notch. Denies pain, GERD, nausea, vomiting. No previous EGD  Underwent upper GI series 01/09/2025 which showed: 13 mm barium tablet became stuck in the upper thoracic esophagus, without obvious focal narrowing or stricture. Contrast was seen opacifying the ascending colon, no evidence of obstruction.   Workup notable for - CT chest abdomen pelvis with contrast with progressive liver metastasis, progressive retroperitoneal and left common iliac metastatic adenopathy, new 3 mm solid left lower lobe pulmonary nodule suspicious for metastasis, new small volume pelvic ascites, stable 1.5 cm pancreatic body mass with stable pancreatic tail atrophy and mild pancreatic ductal dilation.  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  Dilated main pulmonary artery. - Hgb 7.9 (down from 10.7 2 weeks ago) - Platelets 141 - WBC 12.4, uptrending - AST 48/alk phos 270/total bilirubin 1.3 - CR 0.36 - Albumin 2.4 - Potassium 3.4, sodium 130 - INR 2.0, PT 23.9 - CA 19-9 [id number]    PREVIOUS GI WORKUP   Colonoscopy 2024 with two sessile polyps  UGIS  1/12 UGIS: CLINICAL DATA: Decreased appetite, weight loss, nausea, vomiting, malnutrition worsening over 1 year. Concern for possible intestinal obstruction. Request for upper GI series with small-bowel follow-through for further evaluation.  EXAM: UPPER GI SERIES WITH SMALL BOWEL FOLLOW-THROUGH FLUOROSCOPY: Radiation Exposure Index (as provided by the fluoroscopic device): 2.00 mGy Kerma TECHNIQUE: Combined double contrast and single contrast upper GI series using thin barium. Subsequently, serial images of the small bowel were obtained including spot views of the terminal ileum. This exam was performed by Kimble Clas, PA-C , and was supervised and interpreted by Dr. MARLA Kilts .   COMPARISON: None Available. FINDINGS: Esophagus: Normal appearance. Esophageal motility: Within normal limits. Gastroesophageal reflux: None visualized. Ingested 13 mm barium tablet: Became stuck in the upper thoracic esophagus, despite multiple sips of water and thin barium. No focal narrowing or obvious stricture seen. Stomach: Normal appearance. No hiatal hernia. Gastric emptying: Normal. Duodenum: Normal appearance. Other: Contrast seen opacifying the proximal ascending colon. No evidence of obstruction.   IMPRESSION: 13 mm barium tablet became stuck in the upper thoracic esophagus, without obvious focal narrowing or stricture. Contrast was seen opacifying the ascending colon, no evidence of obstruction.  so  Past Medical History:  Diagnosis Date   Acute pulmonary embolism (HCC) 10/29/2023   Allergy    Back pain    Cancer (HCC) 2019   left breast cancer-last radiation Dec.2019   Diabetes mellitus without complication [facility])    Medical history non-contributory    Personal history of radiation therapy    Port-A-Cath in place - removed  12-11-2023 due to port-a-cath infection. 11/13/2023   Prediabetes    Tiredness    Vitamin D  deficiency     Surgical History:  She  has a past surgical history that includes  Wisdom tooth extraction; Breast lumpectomy with radioactive seed and sentinel lymph node biopsy (Left, 09/29/2018); Colonoscopy (2016); Polypectomy; Breast lumpectomy (Left); Breast biopsy (Left, 08/04/2018); IR IMAGING GUIDED PORT INSERTION (11/03/2023); IR REMOVAL TUN ACCESS W/ PORT W/O FL MOD SED (12/11/2023); IR Radiologist Eval & Mgmt (12/15/2023); IR PATIENT EVAL TECH 0-60 MINS (12/18/2023); IR PATIENT EVAL TECH 0-60 MINS (12/24/2023); IR PATIENT EVAL TECH 0-60 MINS (12/28/2023); IR PATIENT EVAL TECH 0-60 MINS (01/04/2024); IR PATIENT EVAL TECH 0-60 MINS (01/13/2024); IR PATIENT EVAL TECH 0-60 MINS (01/22/2024); and IR CV Line Injection (11/30/2024). Family History:  Her family history includes Breast cancer (age of onset: 29) in her mother; Colon polyps in her mother. Social History:   reports that she quit smoking about 9 years ago. Her smoking use included cigarettes. She smoked an average of 0.3 packs per day. She has never used smokeless tobacco. She reports that she does not currently use alcohol after a past usage of about 4.0 standard drinks of alcohol per week. She reports that she does not use drugs.  Prior to Admission medications  Medication Sig Start Date End Date Taking? Authorizing Provider  apixaban  (ELIQUIS ) 5 MG TABS tablet Take 1 tablet (5 mg total) by mouth 2 (two) times daily. 08/31/24  Yes [name], MD  cyanocobalamin (VITAMIN B12) 500 MCG tablet Take 500 mcg by mouth daily.   Yes [provider]  metoCLOPramide  (REGLAN ) 10 MG tablet Take 1 tablet (10 mg total) by mouth every 8 (eight) hours as needed for nausea. 11/02/24  Yes [name], MD  ondansetron  (ZOFRAN ) 8 MG tablet Take 1 tablet (8 mg total) by mouth every 8 (eight) hours as needed for nausea or vomiting. 10/26/24  Yes [name], MD  potassium chloride  SA (KLOR-CON  M) 20 MEQ tablet Take 1 tablet (20 mEq total) by mouth 2 (two) times daily. 11/02/24  Yes [name], MD  prochlorperazine  (COMPAZINE ) 10 MG tablet 1 tablet  as needed for nausea or vomiting Orally every 6 hours   Yes [provider]  cetirizine (ZYRTEC) 10 MG chewable tablet Chew 10 mg by mouth daily. Patient not taking: Reported on 01/08/2025    [provider]    Current Facility-Administered Medications  Medication Dose Route Frequency Provider Last Rate Last Admin   0.9 %  sodium chloride  infusion   Intravenous Continuous Alto Isaiah CROME, NP 100 mL/hr at 01/11/25 0611 New Bag at 01/11/25 9388   apixaban  (ELIQUIS ) tablet 5 mg  5 mg Oral BID Dena Charleston, MD   5 mg at 01/11/25 0915   bisacodyl  (DULCOLAX) suppository 10 mg  10 mg Rectal Daily PRN Clayton Tinnie ORN, DO       Chlorhexidine  Gluconate Cloth 2 % PADS 6 each  6 each Topical Daily Dorrell, Robert, MD   6 each at 01/10/25 1038   feeding supplement (ENSURE PLUS HIGH PROTEIN) liquid 237 mL  237 mL Oral BID BM Danford, Lonni SQUIBB, MD   237 mL at 01/11/25 0915   HYDROmorphone  (DILAUDID ) injection 1 mg  1 mg Intravenous Q4H PRN Jonel Lonni SQUIBB, MD   1 mg at 01/10/25 1753   metoCLOPramide  (REGLAN ) injection 10 mg  10 mg Intravenous Q8H Mims, Lauren W, DO   10 mg at 01/11/25 0608   oxyCODONE  (Oxy IR/ROXICODONE )  immediate release tablet 5 mg  5 mg Oral Q4H PRN Jonel Lonni SQUIBB, MD   5 mg at 01/09/25 1749   pregabalin  (LYRICA ) capsule 25 mg  25 mg Oral BID Clayton Tinnie ORN, DO   25 mg at 01/11/25 0915   prochlorperazine  (COMPAZINE ) injection 10 mg  10 mg Intravenous Q4H PRN Jonel Lonni SQUIBB, MD   10 mg at 01/11/25 0915   senna (SENOKOT) tablet 17.2 mg  2 tablet Oral BID Clayton Tinnie ORN, DO   17.2 mg at 01/11/25 0915   sodium chloride  flush (NS) 0.9 % injection 10-40 mL  10-40 mL Intracatheter Q12H Dena Charleston, MD   10 mL at 01/11/25 0915   sodium chloride  flush (NS) 0.9 % injection 10-40 mL  10-40 mL Intracatheter PRN Dena Charleston, MD        Allergies as of 01/07/2025 - Review Complete 01/07/2025  Allergen Reaction Noted   Irinotecan  liposome Shortness  Of Breath and Other (See Comments) 11/11/2023    Review of Systems  Constitutional:  Positive for malaise/fatigue and weight loss. Negative for chills and fever.  HENT:  Negative for hearing loss and tinnitus.   Eyes:  Negative for blurred vision and double vision.  Respiratory:  Negative for cough and hemoptysis.   Cardiovascular:  Negative for chest pain and palpitations.  Gastrointestinal:  Negative for abdominal pain, blood in stool, constipation, diarrhea, heartburn, melena, nausea and vomiting.  Genitourinary:  Negative for dysuria and urgency.  Musculoskeletal:  Negative for myalgias and neck pain.  Skin:  Negative for itching and rash.  Neurological:  Negative for seizures and loss of consciousness.  Psychiatric/Behavioral:  Negative for depression and suicidal ideas.        Physical Exam:  Vital signs in last 24 hours: Temp:  [98 F (36.7 C)-98.9 F (37.2 C)] 98 F (36.7 C) (01/14 0800) Pulse Rate:  [99-128] 107 (01/14 0800) Resp:  [18] 18 (01/14 0800) BP: (91-113)/(59-77) 113/77 (01/14 0800) SpO2:  [98 %-100 %] 100 % (01/14 0800) Last BM Date : 01/11/25 Last BM recorded by nurses in past 5 days No data recorded  Physical Exam Constitutional:      Appearance: She is ill-appearing.  HENT:     Head: Normocephalic and atraumatic.     Right Ear: There is no impacted cerumen.     Nose: Nose normal. No congestion.     Mouth/Throat:     Mouth: Mucous membranes are moist.     Pharynx: Oropharynx is clear. No oropharyngeal exudate.  Eyes:     General: No scleral icterus.    Extraocular Movements: Extraocular movements intact.  Cardiovascular:     Rate and Rhythm: Regular rhythm. Tachycardia present.  Pulmonary:     Effort: Pulmonary effort is normal. No respiratory distress.  Abdominal:     General: Bowel sounds are normal. There is no distension.     Palpations: Abdomen is soft. There is no mass.     Tenderness: There is no abdominal tenderness.     Hernia: No  hernia is present.  Musculoskeletal:        General: No swelling. Normal range of motion.  Skin:    General: Skin is warm and dry.  Neurological:     General: No focal deficit present.     Mental Status: She is oriented to person, place, and time.  Psychiatric:        Mood and Affect: Mood normal.        Behavior: Behavior normal.  Thought Content: Thought content normal.        Judgment: Judgment normal.      LAB RESULTS: Recent Labs    01/09/25 0342 01/10/25 0048  WBC 10.9* 12.4*  HGB 7.8* 7.9*  HCT 24.4* 24.6*  PLT 156 141*   BMET Recent Labs    01/09/25 0342 01/10/25 0048  NA 130* 132*  K 3.4* 3.4*  CL 97* 98  CO2 24 25  GLUCOSE 131* 106*  BUN <5* <5*  CREATININE 0.30* 0.36*  CALCIUM  8.1* 8.1*   LFT Recent Labs    01/10/25 0048  PROT 5.0*  ALBUMIN 2.5*  AST 48*  ALT 18  ALKPHOS 270*  BILITOT 1.3*   PT/INR No results for input(s): LABPROT, INR in the last 72 hours.  STUDIES: DG Fluoro Rm 1-60 Min - No Report Result Date: 01/09/2025 Fluoroscopy was utilized by the requesting physician.  No radiographic interpretation.   DG Fluoro Rm 1-60 Min - No Report Result Date: 01/09/2025 Fluoroscopy was utilized by the requesting physician.  No radiographic interpretation.   DG Fluoro Rm 1-60 Min - No Report Result Date: 01/09/2025 Fluoroscopy was utilized by the requesting physician.  No radiographic interpretation.   DG Fluoro Rm 1-60 Min - No Report Result Date: 01/09/2025 Fluoroscopy was utilized by the requesting physician.  No radiographic interpretation.   DG Fluoro Rm 1-60 Min - No Report Result Date: 01/09/2025 Fluoroscopy was utilized by the requesting physician.  No radiographic interpretation.   DG Fluoro Rm 1-60 Min - No Report Result Date: 01/09/2025 Fluoroscopy was utilized by the requesting physician.  No radiographic interpretation.   DG Fluoro Rm 1-60 Min - No Report Result Date: 01/09/2025 Fluoroscopy was utilized by the  requesting physician.  No radiographic interpretation.      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 DDX includes radiation induced stricture, candida esophagitis in the setting of chemo, dysmotility -- EGD, timing TBD (possibly tomorrow afternoon) -- hold Eliquis  -- continue supportive care  Coagulopathy possibly in the setting of progressive liver mets versus infection PT 23.9, INR 2.0 WBC 12.4  Pulmonary Embolism 2024 On eliquis , last dose 1/14 AM  Acute cystitis versus asymptomatic pyuria Symptoms resolved on Rocephin   Adenocarcinoma of head of pancreas with mets to liver, lung, lymph node Diagnosed 10/2023 follows outpatient with Dr. Lanny.  Negative genetic testing.  Undergoing chemo with discussion of referral to [facility] for clinical trials -Palliative care and oncology following  Breast cancer s/p lumpectomy History of radiation  Thank you for your kind consultation, we will continue to follow.   Spirit Wernli CHRISTELLA Blower  01/11/2025, 11:06 AM  "

## 2025-01-11 NOTE — Progress Notes (Signed)
 " Daily Progress Note   Patient Name: Ashley Clark       Date: 01/11/2025 DOB: [birth year]  Age: 64 y.o. MRN#: [id number] Attending Physician: Austria, Eric J, DO Primary Care Physician: [name], PA Admit Date: 01/07/2025 Length of Stay: 3 days  Reason for Follow-up: Establishing goals of care, Non pain symptom management, and Pain control  Subjective:   CC: Patient feels her bilateral lower extremity neuropathic pain has improved.  Following up regarding goals of care and symptom management.  Reviewed oncologist documentation from 01/10/2025.  Oncologist noted that patient's cancer has significantly progressed on second line chemotherapy.  Patient is unfortunately not a candidate for more chemotherapy.  Continuing to treat/reverse what can be manage particularly to help with symptom management.  Patient did agree to change her CODE STATUS to DNR/DNI. At time of EMR review in past 24 hours patient has received as needed IV Dilaudid  1 mg x 2 doses.  Patient has not received any as needed oxycodone .  Patient continues to receive scheduled Reglan  10 mg every 8 hours and Lyrica  25 mg twice daily which was started on 01/10/2025. Discussed care with oncologist and hospitalist for medical updates.  Hospitalist was able to discuss with radiology who noted that patient's swallowing study noted that the barium tablet did become stuck in the upper thoracic esophagus though there was no obvious focal narrowing or stricture.  Hospitalist has consulted GI for evaluation and recommendations.  Presented to bedside to see patient.  Patient laying comfortably in the bed.  Family at bedside.  Again introduced myself as a member of the palliative medicine team.  Updated patient that swallow study did not show obvious signs of focal narrowing or stricture.  Explained that GI has been consulted for evaluation and recommendations. Inquired about patient's oral intake and she noted that it has been minimal despite  being on the scheduled Reglan .  Acknowledged this and noted hope GI could provide further recommendations. Did discuss that patient was able to have a bowel movement yesterday which help with the sensation of constipation.  Noted importance of regular bowel movements. Also inquired about patient's bilateral lower extremity neuropathy pain.  Patient feels this is much better controlled with the initiation of the pregabalin .  She does not feel the pregabalin  dose needs to be adjusted at this time with the improvement of her pain.  Noted can be adjusted at a later time if needed.  Patient not complaining of other pain at this time.  During conversation, GI provider presented to bedside.  This provider excused herself so that patient could discuss planning and evaluation with GI as this would be important to patient's symptom burden.  Noted palliative medicine team will continue to follow along with patient's medical journey.  Discussed care with RN, hospitalist, and oncologist to coordinate care.  Objective:   Vital Signs:  BP 107/70   Pulse 99   Temp 98.1 F (36.7 C)   Resp 18   Ht 5' 3 (1.6 m)   Wt 65.5 kg   LMP 03/12/2015   SpO2 100%   BMI 25.58 kg/m   Physical Exam: General: NAD, alert, chronically ill-appearing Cardiovascular: Tachycardia noted Respiratory: no increased work of breathing noted, not in respiratory distress Neuro: A&Ox4, following commands easily Psych: appropriately answers all questions  Assessment & Plan:   Assessment: Patient is a 64 year old female with a past medical history of metastatic pancreatic cancer with disease to the liver, diabetes mellitus type 2, hypertension, history of PE  on Eliquis , and prior breast cancer status postlumpectomy who was admitted on 01/07/2025 for progressive weakness and lack of p.o. intake.  Since admission, patient has received management for dysphagia and electrolyte abnormalities.  Palliative medicine team consulted to assist  with goals of care and symptom management.  Recommendations/Plan: # Complex medical decision making/goals of care:  - Discussed care with patient as detailed above in HPI.  Oncology spoke with patient on 01/10/2025 and discussed that patient is not a candidate for further cancer directed therapies.  At this time continuing appropriate medical management with hope to reverse conditions if able.  GI to evaluate for recommendations.  Palliative medicine team continuing to follow along with patient's medical journey.  -  Code Status: Limited: Do not attempt resuscitation (DNR) -DNR-LIMITED -Do Not Intubate/DNI   # Symptom management: Patient is receiving these palliative interventions for symptom management with an intent to improve quality of life.    - Pain, acute on chronic in setting of metastatic pancreatic cancer with disease to the liver Patient noted that her bilateral lower extremity pain is much improved today with the initiation of Lyrica .  Patient not describing other sensations of pain at this time.   - Continue pregabalin  25 mg twice daily   - Continue oxycodone  5 mg every 4 hours as needed   - Continue IV hydromorphone  1 mg every 4 hours as needed breakthrough pain after oral opioid   - Poor oral intake/dysphagia with weight loss/severe protein calorie malnutrition/cancer associated cachexia Noted concerns for gastroparesis.  Swallowing study did not show overt signs of narrowing or stricture.  GI consulted for recommendations. Personally reviewed EKG from 01/09/2025 noting QTc 437.   - Continue IV Reglan  10 mg every 8 hours   - Constipation   - Continue senna 2 tab twice daily   - Continue bisacodyl  10 mg suppository daily as needed  # Psychosocial Support:  - Significant other: Jerona, Mother: Orlean  # Discharge Planning: To Be Determined  Discussed with: Patient, hospitalist, RN, oncologist  Thank you for allowing the palliative care team to participate in the care  Seaira D Demby.  Tinnie Radar, DO Palliative Care Provider PMT # [phone]  If patient remains symptomatic despite maximum doses, please call PMT at [phone] between 0700 and 1900. Outside of these hours, please call attending, as PMT does not have night coverage.  Billing based on MDM: Moderate  Problems Addressed: One or more chronic illnesses with severe exacerbation, progression, or side effects of treatment.  Risks: Prescription drug management  "

## 2025-01-11 NOTE — Progress Notes (Addendum)
 " PROGRESS NOTE    Ashley Clark  FMW:[id number] DOB: 11/03/1961 DOA: 01/07/2025 PCP: [name], PA    Brief Narrative:   Ashley Clark is a 64 y.o. female with past medical history significant for stage IV pancreatic cancer with liver metastasis, HTN, PE on Eliquis , DM2, history of breast cancer s/p lumpectomy who presented to [facility] ED on 01/07/2025 with dysphagia, inability to tolerate oral intake over the last 2 weeks.  In the ED, temperature 98.0 F, HR 129, RR 19, BP 90/52, SpO2 100% on room air.  WBC 11.3, hemoglobin 9.3, platelet count 190.  Sodium 128, Tessman 3.6, chloride 92, CO2 25, glucose 105, BUN 7, creatinine 0.42.  AST 49, ALT 29, total bilirubin 1.6.  Lactic acid 1.8.  Urinalysis with large leukocytes, negative nitrite, no bacteria.  TRH consulted for admission for further evaluation and management of dysphagia.  Assessment & Plan:   Dysphagia Patient presenting to the ED with 2-week history of poor oral intake, unable to tolerate solids/liquids; and progressive weight loss since Thanksgiving.  Underwent upper GI series on 1/12 with findings concerning for possible proximal esophageal stricture with delay of the barium capsule, no overt abnormalities of the stomach or proximal small bowel. -- [facility] following, appreciate assistance -- Reglan  10 mg IV every 8 hours scheduled --Compazine  10 mg IV every 4 hours as needed refractory nausea/vomiting -- Eliquis  on hold, planning EGD tomorrow; n.p.o. after midnight  Hyponatremia Etiology likely secondary to poor oral intake, inability to tolerate oral/liquids due to severe dysphagia as above. -- Na 128>>132 -- NS at 100 mL/h -- BMP in a.m.  Stage IV pancreatic cancer with liver metastasis History of breast cancer s/p lumpectomy Follows with medical oncology, Dr. Lanny outpatient.  Diagnosed November 2024.  Received first-line chemotherapy with Nalrifox but dose was reduced due to poor performance  status.  Recent imaging on 10/19/2024 concerning for increased liver and retroperitoneal metastasis and potential new left lung metastasis with small volume ascites; and chemotherapy regimen was changed to gemcitabine  and Abraxane  on 11/02/2024. -- Medical oncology, palliative care following, appreciate assistance -- Further per medical oncology, overall prognosis appears poor  HTN Currently not on antihypertensives.  History of PE on anticoagulation -- Eliquis  on hold for EGD  Anemia Baseline hemoglobin 10/11. -- Hgb 9.3>7.8>7.9 -- Transfuse for hemoglobin less than 7.0  DM2 Hemoglobin A1c 6.8%.  Diet controlled at baseline.   DVT prophylaxis: SCDs Start: 01/08/25 0355    Code Status: Limited: Do not attempt resuscitation (DNR) -DNR-LIMITED -Do Not Intubate/DNI  Family Communication: No family present at bedside this morning  Disposition Plan:  Level of care: Med-Surg Status is: Inpatient Remains inpatient appropriate because: Pending EGD    Consultants:  Medical oncology, Dr. Lanny Palliative care [place] [facility]  Procedures:  Upper GI series 1/12  Antimicrobials:  None   Subjective: Patient seen examined bedside, sitting edge of bed.  Actively nauseous and requesting nausea medication.  Unfortunately upper GI series report not available, called radiology and report message to my epic chat but will not be validated in the [facility] by the radiologist who is apparently not here today.  Upper GI series concerning for proximal esophageal stricture with delay in barium tablet.  Consulted GI for consideration of EGD, planning tomorrow.  Patient with no other specific complaints, questions or concerns at this time.  Denies headache, no dizziness, no chest pain, no palpitations, no shortness of breath, no abdominal pain.  No acute events overnight per nurse  staff.  Objective: Vitals:   01/11/25 0454 01/11/25 0800 01/11/25 1307 01/11/25 1727  BP: 107/70 113/77 115/74  109/77  Pulse: 99 (!) 107 (!) 115 (!) 121  Resp: 18 18  17   Temp: 98.1 F (36.7 C) 98 F (36.7 C) 98.6 F (37 C) 99.8 F (37.7 C)  TempSrc:  Oral Oral Oral  SpO2: 100% 100% 100% 100%  Weight:      Height:        Intake/Output Summary (Last 24 hours) at 01/11/2025 1816 Last data filed at 01/11/2025 1100 Gross per 24 hour  Intake 1265.45 ml  Output --  Net 1265.45 ml   Filed Weights   01/08/25 0457  Weight: 65.5 kg    Examination:  Physical Exam: GEN: NAD, alert and oriented x 3, chronically ill in appearance HEENT: NCAT, PERRL, EOMI, sclera clear, MMM PULM: CTAB w/o wheezes/crackles, normal respiratory effort, on room air CV: RRR w/o M/G/R GI: abd soft, NTND, + BS MSK: no peripheral edema, moves all extremities independently NEURO: No focal neurological deficit PSYCH: normal mood/affect Integumentary: No concerning rashes/lesions/wounds noted on exposed skin surfaces    Data Reviewed: I have personally reviewed following labs and imaging studies  CBC: Recent Labs  Lab 01/07/25 2149 01/09/25 0342 01/10/25 0048  WBC 11.3* 10.9* 12.4*  NEUTROABS 7.8*  --   --   HGB 9.3* 7.8* 7.9*  HCT 28.7* 24.4* 24.6*  MCV 78.8* 80.5 79.9*  PLT 190 156 141*   Basic Metabolic Panel: Recent Labs  Lab 01/07/25 2149 01/09/25 0342 01/10/25 0048  NA 128* 130* 132*  K 3.6 3.4* 3.4*  CL 92* 97* 98  CO2 25 24 25   GLUCOSE 105* 131* 106*  BUN 7* <5* <5*  CREATININE 0.42* 0.30* 0.36*  CALCIUM  9.0 8.1* 8.1*   GFR: Estimated Creatinine Clearance: 66.3 mL/min (A) (by C-G formula based on SCr of 0.36 mg/dL (L)). Liver Function Tests: Recent Labs  Lab 01/07/25 2149 01/09/25 0342 01/10/25 0048  AST 49* 37 48*  ALT 26 16 18   ALKPHOS 283* 215* 270*  BILITOT 1.6* 1.1 1.3*  PROT 5.9* 4.8* 5.0*  ALBUMIN 3.0* 2.4* 2.5*   No results for input(s): LIPASE, AMYLASE in the last 168 hours. No results for input(s): AMMONIA in the last 168 hours. Coagulation Profile: Recent  Labs  Lab 01/07/25 2149  INR 2.0*   Cardiac Enzymes: No results for input(s): CKTOTAL, CKMB, CKMBINDEX, TROPONINI in the last 168 hours. BNP (last 3 results) No results for input(s): PROBNP in the last 8760 hours. HbA1C: No results for input(s): HGBA1C in the last 72 hours. CBG: Recent Labs  Lab 01/07/25 2142  GLUCAP 95   Lipid Profile: No results for input(s): CHOL, HDL, LDLCALC, TRIG, CHOLHDL, LDLDIRECT in the last 72 hours. Thyroid Function Tests: No results for input(s): TSH, T4TOTAL, FREET4, T3FREE, THYROIDAB in the last 72 hours. Anemia Panel: No results for input(s): VITAMINB12, FOLATE, FERRITIN, TIBC, IRON, RETICCTPCT in the last 72 hours. Sepsis Labs: Recent Labs  Lab 01/07/25 2153  LATICACIDVEN 1.8    Recent Results (from the past 240 hours)  Culture, blood (Routine x 2)     Status: None (Preliminary result)   Collection Time: 01/07/25 10:26 PM   Specimen: Left Antecubital; Blood  Result Value Ref Range Status   Specimen Description   Final    LEFT ANTECUBITAL Performed at [facility], [address], [place], [place] [address]    Special Requests   Final    BOTTLES  DRAWN AEROBIC AND ANAEROBIC Blood Culture adequate volume Performed at [facility], [address] [place], [place] [address]    Culture   Final    NO GROWTH 4 DAYS Performed at [facility], [address] [place], [place] [address]    Report Status PENDING  Incomplete  Culture, blood (Routine x 2)     Status: None (Preliminary result)   Collection Time: 01/07/25 10:38 PM   Specimen: Right Antecubital; Blood  Result Value Ref Range Status   Specimen Description   Final    RIGHT ANTECUBITAL Performed at [facility], [address] [place], [place] [address]    Special Requests   Final    BOTTLES DRAWN AEROBIC AND ANAEROBIC Blood Culture results may not be optimal due to an  inadequate volume of blood received in culture bottles Performed at [facility], [address] [place], [place] [address]    Culture   Final    NO GROWTH 4 DAYS Performed at [facility], [address] [place], [place] [address]    Report Status PENDING  Incomplete  Urine Culture     Status: Abnormal   Collection Time: 01/08/25 12:49 AM   Specimen: Urine, Random  Result Value Ref Range Status   Specimen Description   Final    URINE, RANDOM Performed at [facility], [address], [place], [place] [address]    Special Requests   Final    NONE Reflexed from [id number] Performed at [facility], [address], [place], [place] [address]    Culture MULTIPLE SPECIES PRESENT, SUGGEST RECOLLECTION (A)  Final   Report Status 01/09/2025 FINAL  Final         Radiology Studies: No results found.      Scheduled Meds:  Chlorhexidine  Gluconate Cloth  6 each Topical Daily   feeding supplement  237 mL Oral BID BM   metoCLOPramide  (REGLAN ) injection  10 mg Intravenous Q8H   pregabalin   25 mg Oral BID   senna  2 tablet Oral BID   sodium chloride  flush  10-40 mL Intracatheter Q12H   Continuous Infusions:  sodium chloride  100 mL/hr at 01/11/25 9388   sodium chloride        LOS: 3 days    Time spent: 50 minutes spent on 01/11/2025 caring for this patient face-to-face including chart review, ordering labs/tests, documenting, discussion with nursing staff, consultants, updating family and interview/physical exam    Camellia PARAS Lynna Zamorano, DO [facility] Available via Epic secure chat 7am-7pm After these hours, please refer to coverage provider listed on [email or web address] 01/11/2025, 6:16 PM   "

## 2025-01-12 ENCOUNTER — Inpatient Hospital Stay (HOSPITAL_COMMUNITY): Admitting: Anesthesiology

## 2025-01-12 ENCOUNTER — Encounter (HOSPITAL_COMMUNITY)
Admission: EM | Disposition: A | Discharge status: Home / Self Care | Payer: Self-pay | Source: Home / Self Care | Attending: Internal Medicine

## 2025-01-12 ENCOUNTER — Encounter (HOSPITAL_COMMUNITY): Payer: Self-pay

## 2025-01-12 DIAGNOSIS — N3000 Acute cystitis without hematuria: Secondary | ICD-10-CM | POA: Diagnosis not present

## 2025-01-12 DIAGNOSIS — E119 Type 2 diabetes mellitus without complications: Secondary | ICD-10-CM

## 2025-01-12 DIAGNOSIS — C259 Malignant neoplasm of pancreas, unspecified: Secondary | ICD-10-CM

## 2025-01-12 DIAGNOSIS — I10 Essential (primary) hypertension: Secondary | ICD-10-CM

## 2025-01-12 DIAGNOSIS — K229 Disease of esophagus, unspecified: Secondary | ICD-10-CM

## 2025-01-12 DIAGNOSIS — Z87891 Personal history of nicotine dependence: Secondary | ICD-10-CM

## 2025-01-12 DIAGNOSIS — B3781 Candidal esophagitis: Secondary | ICD-10-CM | POA: Diagnosis not present

## 2025-01-12 DIAGNOSIS — R1319 Other dysphagia: Secondary | ICD-10-CM | POA: Diagnosis not present

## 2025-01-12 DIAGNOSIS — R1310 Dysphagia, unspecified: Secondary | ICD-10-CM | POA: Diagnosis not present

## 2025-01-12 HISTORY — PX: ESOPHAGOGASTRODUODENOSCOPY: SHX5428

## 2025-01-12 HISTORY — PX: MALONEY DILATION: SHX5535

## 2025-01-12 LAB — CBC
HCT: 23.9 % — ABNORMAL LOW (ref 36.0–46.0)
Hemoglobin: 8 g/dL — ABNORMAL LOW (ref 12.0–15.0)
MCH: 26.2 pg (ref 26.0–34.0)
MCHC: 33.5 g/dL (ref 30.0–36.0)
MCV: 78.4 fL — ABNORMAL LOW (ref 80.0–100.0)
Platelets: 149 K/uL — ABNORMAL LOW (ref 150–400)
RBC: 3.05 MIL/uL — ABNORMAL LOW (ref 3.87–5.11)
RDW: 17.9 % — ABNORMAL HIGH (ref 11.5–15.5)
WBC: 16.5 K/uL — ABNORMAL HIGH (ref 4.0–10.5)
nRBC: 0.1 % (ref 0.0–0.2)

## 2025-01-12 LAB — CULTURE, BLOOD (ROUTINE X 2)
Culture: NO GROWTH
Culture: NO GROWTH
Special Requests: ADEQUATE

## 2025-01-12 LAB — BASIC METABOLIC PANEL WITH GFR
Anion gap: 10 (ref 5–15)
BUN: 6 mg/dL — ABNORMAL LOW (ref 8–23)
CO2: 23 mmol/L (ref 22–32)
Calcium: 8.3 mg/dL — ABNORMAL LOW (ref 8.9–10.3)
Chloride: 103 mmol/L (ref 98–111)
Creatinine, Ser: 0.32 mg/dL — ABNORMAL LOW (ref 0.44–1.00)
GFR, Estimated: >60 mL/min
Glucose, Bld: 111 mg/dL — ABNORMAL HIGH (ref 70–99)
Potassium: 3 mmol/L — ABNORMAL LOW (ref 3.5–5.1)
Sodium: 135 mmol/L (ref 135–145)

## 2025-01-12 LAB — PHOSPHORUS: Phosphorus: 2.4 mg/dL — ABNORMAL LOW (ref 2.5–4.6)

## 2025-01-12 LAB — MAGNESIUM: Magnesium: 2 mg/dL (ref 1.7–2.4)

## 2025-01-12 MED ORDER — PHENYLEPHRINE HCL (PRESSORS) 10 MG/ML IV SOLN
INTRAVENOUS | Status: DC | PRN
  Administered 2025-01-12 (×2): 160 ug via INTRAVENOUS

## 2025-01-12 MED ORDER — ONDANSETRON HCL 4 MG/2ML IJ SOLN
4.0000 mg | Freq: Four times a day (QID) | INTRAMUSCULAR | Status: DC | PRN
  Administered 2025-01-12: 4 mg via INTRAVENOUS
  Filled 2025-01-12: qty 2

## 2025-01-12 MED ORDER — APIXABAN 5 MG PO TABS
5.0000 mg | ORAL_TABLET | Freq: Two times a day (BID) | ORAL | Status: DC
  Administered 2025-01-13 – 2025-01-15 (×5): 5 mg via ORAL
  Filled 2025-01-12 (×5): qty 1

## 2025-01-12 MED ORDER — PROPOFOL 500 MG/50ML IV EMUL
INTRAVENOUS | Status: DC | PRN
  Administered 2025-01-12: 175 ug/kg/min via INTRAVENOUS

## 2025-01-12 MED ORDER — POTASSIUM CHLORIDE 10 MEQ/100ML IV SOLN
10.0000 meq | INTRAVENOUS | Status: AC
  Administered 2025-01-12 (×4): 10 meq via INTRAVENOUS
  Filled 2025-01-12 (×5): qty 100

## 2025-01-12 MED ORDER — PROCHLORPERAZINE EDISYLATE 10 MG/2ML IJ SOLN
10.0000 mg | INTRAMUSCULAR | Status: DC | PRN
  Administered 2025-01-12 – 2025-01-13 (×2): 10 mg via INTRAVENOUS
  Filled 2025-01-12 (×2): qty 2

## 2025-01-12 MED ORDER — PANTOPRAZOLE SODIUM 40 MG IV SOLR
40.0000 mg | INTRAVENOUS | Status: DC
  Administered 2025-01-12 – 2025-01-14 (×3): 40 mg via INTRAVENOUS
  Filled 2025-01-12 (×3): qty 10

## 2025-01-12 MED ORDER — FLUCONAZOLE IN SODIUM CHLORIDE 200-0.9 MG/100ML-% IV SOLN
200.0000 mg | INTRAVENOUS | Status: DC
  Administered 2025-01-13 – 2025-01-14 (×2): 200 mg via INTRAVENOUS
  Filled 2025-01-12 (×3): qty 100

## 2025-01-12 MED ORDER — FLUCONAZOLE IN SODIUM CHLORIDE 400-0.9 MG/200ML-% IV SOLN
400.0000 mg | Freq: Once | INTRAVENOUS | Status: AC
  Administered 2025-01-12: 400 mg via INTRAVENOUS
  Filled 2025-01-12: qty 200

## 2025-01-12 NOTE — Interval H&P Note (Signed)
 History and Physical Interval Note: For EGD today to evaluate dysphagia symptom, weight loss all in the setting of progressive metastatic pancreas cancer. Barium esophagram/upper GI series suggests a proximal esophageal stenosis The nature of the procedure, as well as the risks, benefits, and alternatives were carefully and thoroughly reviewed with the patient. Ample time for discussion and questions allowed. The patient understood, was satisfied, and agreed to proceed.      Latest Ref Rng & Units 01/12/2025    4:05 AM 01/10/2025   12:48 AM 01/09/2025    3:42 AM  CBC  WBC 4.0 - 10.5 K/uL 16.5  12.4  10.9   Hemoglobin 12.0 - 15.0 g/dL 8.0  7.9  7.8   Hematocrit 36.0 - 46.0 % 23.9  24.6  24.4   Platelets 150 - 400 K/uL 149  141  156      01/12/2025 1:57 PM  Kiwana D Menken  has presented today for surgery, with the diagnosis of dysphagia.  The various methods of treatment have been discussed with the patient and family. After consideration of risks, benefits and other options for treatment, the patient has consented to  Procedures: EGD (ESOPHAGOGASTRODUODENOSCOPY) (N/A) as a surgical intervention.  The patient's history has been reviewed, patient examined, no change in status, stable for surgery.  I have reviewed the patient's chart and labs.  Questions were answered to the patient's satisfaction.     Gordy HERO Burma Ketcher

## 2025-01-12 NOTE — Plan of Care (Signed)

## 2025-01-12 NOTE — Progress Notes (Signed)
 Ashley Clark   DOB:[birth year]   FM#:[id number]   [id number]  Medical oncology follow-up note  Subjective: Patient underwent a EGD by Dr. Albertus today, she was found to have esophageal candidiasis but no mechanical obstruction in upper GI tract, status post esophageal dilatation.  She tolerated procedure well, no new complaints.  She is tolerating liquid well.   Objective:  Vitals:   01/12/25 1450 01/12/25 1524  BP: 98/61 104/70  Pulse: (!) 103 (!) 104  Resp: (!) 38 (!) 30  Temp:  97.6 F (36.4 C)  SpO2: 99% 100%    Body mass index is 25.58 kg/m. No intake or output data in the 24 hours ending 01/12/25 1711   Sclerae unicteric  Oropharynx clear  No peripheral adenopathy  MSK no focal spinal tenderness, no peripheral edema  Neuro nonfocal    CBG (last 3)  No results for input(s): GLUCAP in the last 72 hours.   Labs:  Lab Results  Component Value Date   WBC 16.5 (H) 01/12/2025   HGB 8.0 (L) 01/12/2025   HCT 23.9 (L) 01/12/2025   MCV 78.4 (L) 01/12/2025   PLT 149 (L) 01/12/2025   NEUTROABS 7.8 (H) 01/07/2025     Urine Studies No results for input(s): UHGB, CRYS in the last 72 hours.  Invalid input(s): UACOL, UAPR, USPG, UPH, UTP, UGL, UKET, UBIL, UNIT, UROB, [place], UEPI, UWBC, [place]N [place]OL [place]NS [place], [place]  Basic Metabolic Panel: Recent Labs  Lab 01/07/25 2149 01/09/25 0342 01/10/25 0048 01/12/25 0405  NA 128* 130* 132* 135  K 3.6 3.4* 3.4* 3.0*  CL 92* 97* 98 103  CO2 25 24 25 23   GLUCOSE 105* 131* 106* 111*  BUN 7* <5* <5* 6*  CREATININE 0.42* 0.30* 0.36* 0.32*  CALCIUM  9.0 8.1* 8.1* 8.3*  MG  --   --   --  2.0  PHOS  --   --   --  2.4*   GFR Estimated Creatinine Clearance: 66.3 mL/min (A) (by C-G formula based on SCr of 0.32 mg/dL (L)). Liver Function Tests: Recent Labs  Lab 01/07/25 2149 01/09/25 0342 01/10/25 0048  AST 49* 37 48*  ALT 26 16 18   ALKPHOS 283* 215* 270*  BILITOT 1.6* 1.1 1.3*   PROT 5.9* 4.8* 5.0*  ALBUMIN 3.0* 2.4* 2.5*   No results for input(s): LIPASE, AMYLASE in the last 168 hours. No results for input(s): AMMONIA in the last 168 hours. Coagulation profile Recent Labs  Lab 01/07/25 2149  INR 2.0*    CBC: Recent Labs  Lab 01/07/25 2149 01/09/25 0342 01/10/25 0048 01/12/25 0405  WBC 11.3* 10.9* 12.4* 16.5*  NEUTROABS 7.8*  --   --   --   HGB 9.3* 7.8* 7.9* 8.0*  HCT 28.7* 24.4* 24.6* 23.9*  MCV 78.8* 80.5 79.9* 78.4*  PLT 190 156 141* 149*   Cardiac Enzymes: No results for input(s): CKTOTAL, CKMB, CKMBINDEX, TROPONINI in the last 168 hours. BNP: Invalid input(s): POCBNP CBG: Recent Labs  Lab 01/07/25 2142  GLUCAP 95   D-Dimer No results for input(s): DDIMER in the last 72 hours. Hgb A1c No results for input(s): HGBA1C in the last 72 hours. Lipid Profile No results for input(s): CHOL, HDL, LDLCALC, TRIG, CHOLHDL, LDLDIRECT in the last 72 hours. Thyroid function studies No results for input(s): TSH, T4TOTAL, T3FREE, THYROIDAB in the last 72 hours.  Invalid input(s): FREET3 Anemia work up No results for input(s): VITAMINB12, FOLATE, FERRITIN, TIBC, IRON, RETICCTPCT in the last 72 hours. Microbiology Recent Results (  from the past 240 hours)  Culture, blood (Routine x 2)     Status: None   Collection Time: 01/07/25 10:26 PM   Specimen: Left Antecubital; Blood  Result Value Ref Range Status   Specimen Description   Final    LEFT ANTECUBITAL Performed at [facility], [address], [place], [place] [address]    Special Requests   Final    BOTTLES DRAWN AEROBIC AND ANAEROBIC Blood Culture adequate volume Performed at [facility], [address] [place], [place] [address]    Culture   Final    NO GROWTH 5 DAYS Performed at [facility], [address] [place], [place] [address]    Report Status 01/12/2025 FINAL  Final  Culture,  blood (Routine x 2)     Status: None   Collection Time: 01/07/25 10:38 PM   Specimen: Right Antecubital; Blood  Result Value Ref Range Status   Specimen Description   Final    RIGHT ANTECUBITAL Performed at [facility], [address], [place], [place] [address]    Special Requests   Final    BOTTLES DRAWN AEROBIC AND ANAEROBIC Blood Culture results may not be optimal due to an inadequate volume of blood received in culture bottles Performed at [facility], [address], [place], [place] [address]    Culture   Final    NO GROWTH 5 DAYS Performed at [facility], [address], [place], [place] [address]    Report Status 01/12/2025 FINAL  Final  Urine Culture     Status: Abnormal   Collection Time: 01/08/25 12:49 AM   Specimen: Urine, Random  Result Value Ref Range Status   Specimen Description   Final    URINE, RANDOM Performed at [facility], [address], [place], [place] [address]    Special Requests   Final    NONE Reflexed from [id number] Performed at [facility], [address], [place], [place] [address]    Culture MULTIPLE SPECIES PRESENT, SUGGEST RECOLLECTION (A)  Final   Report Status 01/09/2025 FINAL  Final      Studies:  No results found.  Assessment: 64 y.o.  Dysphagia secondary to esophageal candidiasis Hyponatremia and hypokalemia H4 metastatic pancreatic cancer, on palliative chemotherapy, with cancer progression on recent CT scan. History of PE on anticoagulation Anemia Type 2 diabetes DNR   Plan:  - Hopefully she can tolerate oral intake better after esophageal dilatation and antibiotics for esophageal candidiasis - Overall poor prognosis, she has progressed through multiple chemo lines therapy, treatment options.  Patient voiced good understanding.  She has agreed with DNR. - I will follow-up on Monday if she is still in hospital, or within 1 to 2 weeks after  hospital discharge.   Onita Mattock, MD 01/12/2025  5:11 PM

## 2025-01-12 NOTE — TOC Initial Note (Signed)
 Transition of Care [facility]) - Initial/Assessment Note    Patient Details  Name: Ashley Clark MRN: [id number] Date of Birth: February 14, 1961  [facility] CM/SW Contact:    Doneta Glenys DASEN, RN Phone Number: 01/12/2025, 11:45 AM  Clinical Narrative:                 PTA lives with spouse. No IP CM needs identified during visit.  Expected Discharge Plan: Home/Self Care Barriers to Discharge: No Barriers Identified, Continued Medical Work up   Patient Goals and CMS Choice Patient states their goals for this hospitalization and ongoing recovery are:: Home CMS Medicare.gov Compare Post Acute Care list provided to:: Patient Choice offered to / list presented to : Patient [place] ownership interest in [facility].provided to:: Patient    Expected Discharge Plan and Services In-house Referral: NA Discharge Planning Services: CM Consult   Living arrangements for the past 2 months: Single Family Home                 DME Arranged: N/A DME Agency: NA       HH Arranged: NA HH Agency: NA        Prior Living Arrangements/Services Living arrangements for the past 2 months: Single Family Home Lives with:: Spouse Patient language and need for interpreter reviewed:: Yes Do you feel safe going back to the place where you live?: Yes      Need for Family Participation in Patient Care: Yes (Comment) Care giver support system in place?: Yes (comment) Current home services:  (NA) Criminal Activity/Legal Involvement Pertinent to Current Situation/Hospitalization: No - Comment as needed  Activities of Daily Living   ADL Screening (condition at time of admission) Independently performs ADLs?: Yes (appropriate for developmental age) Is the patient deaf or have difficulty hearing?: No Does the patient have difficulty seeing, even when wearing glasses/contacts?: No Does the patient have difficulty concentrating, remembering, or making decisions?: No  Permission  Sought/Granted Permission sought to share information with : Case Manager Permission granted to share information with : Yes, Verbal Permission Granted  Share Information with NAME: [name], [name], Emergency Contact  [phone]           Emotional Assessment Appearance:: Appears stated age Attitude/Demeanor/Rapport: Engaged Affect (typically observed): Appropriate Orientation: : Oriented to Self, Oriented to Place, Oriented to  Time, Oriented to Situation Alcohol / Substance Use: Not Applicable Psych Involvement: No (comment)  Admission diagnosis:  Dysuria [R30.0] Dehydration [E86.0] Urinary tract infection [N39.0] Patient Active Problem List   Diagnosis Date Noted   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   Goals of care, counseling/discussion 01/11/2025   Esophageal dysphagia 01/11/2025   Abnormal esophagram 01/11/2025   Decreased oral intake 01/10/2025   Constipation 01/10/2025   Cancer associated pain 01/10/2025   Counseling and coordination of care 01/10/2025   Medication management 01/10/2025   Urinary tract infection 01/08/2025   Protein-calorie malnutrition, severe 01/08/2025   Decompensated liver disease (HCC) 01/08/2025   Hyponatremia 01/08/2025   Microcytic anemia 01/08/2025   Palliative care by specialist 01/08/2025   Bacteremia due to Staphylococcus 12/13/2023   History of pulmonary embolism 12/12/2023   Metastasis to liver (HCC) 12/12/2023   Pancytopenia, acquired (HCC) 12/11/2023   Infected venous access port, initial encounter 12/11/2023   Genetic testing 11/30/2023   Pancreatic cancer (HCC) 11/03/2023   Pancreatic mass 10/29/2023   Liver lesion 10/29/2023   Obesity (BMI 30-39.9) 10/29/2023   At risk for side effect of medication 09/04/2022   Hypertension  associated with type 2 diabetes mellitus (HCC) 07/20/2022   Diabetes mellitus (HCC) 06/25/2022   Hyperlipidemia associated with type 2 diabetes mellitus (HCC) 06/25/2022   Vitamin D   deficiency 06/25/2022   At risk for heart disease 06/25/2022   Malignant neoplasm of overlapping sites of left breast in female, estrogen receptor positive (HCC) 08/18/2018   PCP:  [name], PA Pharmacy:   [facility] [address] [place] [address] Phone: [phone] Fax: [phone]     Social Drivers of Health (SDOH) Social History: SDOH Screenings   Food Insecurity: No Food Insecurity (01/08/2025)  Housing: Low Risk (01/08/2025)  Transportation Needs: No Transportation Needs (01/08/2025)  Utilities: Not At Risk (01/08/2025)  Depression (PHQ2-9): Low Risk (12/14/2024)  Recent Concern: Depression (PHQ2-9) - Medium Risk (09/28/2024)  Tobacco Use: Medium Risk (01/07/2025)   SDOH Interventions:     Readmission Risk Interventions    01/12/2025   11:41 AM  Readmission Risk Prevention Plan  Transportation Screening Complete  PCP or Specialist Appt within 3-5 Days Complete  HRI or Home Care Consult Complete  Social Work Consult for Recovery Care Planning/Counseling Complete  Palliative Care Screening Complete  Medication Review Oceanographer) Complete

## 2025-01-12 NOTE — Anesthesia Postprocedure Evaluation (Signed)
"   Anesthesia Post Note  Patient: Connee JONETTA Louder  Procedure(s) Performed: EGD (ESOPHAGOGASTRODUODENOSCOPY) DILATION, ESOPHAGUS, USING MALONEY DILATOR     Patient location during evaluation: PACU Anesthesia Type: MAC Level of consciousness: awake and alert Pain management: pain level controlled Vital Signs Assessment: post-procedure vital signs reviewed and stable Respiratory status: spontaneous breathing, nonlabored ventilation, respiratory function stable and patient connected to nasal cannula oxygen Cardiovascular status: stable and blood pressure returned to baseline Postop Assessment: no apparent nausea or vomiting Anesthetic complications: no   No notable events documented.  Last Vitals:  Vitals:   01/12/25 1450 01/12/25 1524  BP: 98/61 104/70  Pulse: (!) 103 (!) 104  Resp: (!) 38 (!) 30  Temp:  36.4 C  SpO2: 99% 100%    Last Pain:  Vitals:   01/12/25 1450  TempSrc:   PainSc: 0-No pain                 Franky JONETTA Bald      "

## 2025-01-12 NOTE — Progress Notes (Addendum)
 MEWS Progress Note  Patient Details Name: Ashley Clark MRN: [id number] DOB: Aug 06, 1961 Today's Date: 01/12/2025   MEWS Flowsheet Documentation:  Assess: MEWS Score Temp: (!) 97.4 F (36.3 C) BP: 103/80 MAP (mmHg): 88 Pulse Rate: (!) 123 ECG Heart Rate: (!) 104 Resp: (!) 26 Level of Consciousness: Alert SpO2: 100 % O2 Device: Room Air Patient Activity (if Appropriate): In bed O2 Flow Rate (L/min): 5 L/min Assess: MEWS Score MEWS Temp: 0 MEWS Systolic: 0 MEWS Pulse: 2 MEWS RR: 2 MEWS LOC: 0 MEWS Score: 4 MEWS Score Color: Red Assess: SIRS CRITERIA SIRS Temperature : 0 SIRS Respirations : 1 SIRS Pulse: 1 SIRS WBC: 0 SIRS Score Sum : 2      Patient is asymptomatic and resting comfortably. Provider notified.    Americus Scheurich A O'Donohue 01/12/2025, 7:53 PM

## 2025-01-12 NOTE — Transfer of Care (Signed)
 Immediate Anesthesia Transfer of Care Note  Patient: Ashley Clark  Procedure(s) Performed: EGD (ESOPHAGOGASTRODUODENOSCOPY) DILATION, ESOPHAGUS, USING MALONEY DILATOR  Patient Location: PACU  Anesthesia Type:MAC  Level of Consciousness: sedated, patient cooperative, and responds to stimulation  Airway & Oxygen Therapy: Patient Spontanous Breathing and Patient connected to face mask oxygen  Post-op Assessment: Report given to RN and Post -op Vital signs reviewed and stable  Post vital signs: Reviewed and stable  Last Vitals:  Vitals Value Taken Time  BP 86/53 01/12/25 14:31  Temp    Pulse 102 01/12/25 14:33  Resp 24 01/12/25 14:33  SpO2 100 % 01/12/25 14:33  Vitals shown include unfiled device data.  Last Pain:  Vitals:   01/12/25 1431  TempSrc:   PainSc: Asleep      Patients Stated Pain Goal: 0 (01/12/25 0131)  Complications: No notable events documented.

## 2025-01-12 NOTE — Anesthesia Preprocedure Evaluation (Addendum)
"                                    Anesthesia Evaluation  Patient identified by MRN, date of birth, ID band Patient awake    Reviewed: Allergy & Precautions, NPO status , Patient's Chart, lab work & pertinent test results  Airway Mallampati: II  TM Distance: >3 FB Neck ROM: Full    Dental  (+) Teeth Intact, Dental Advisory Given   Pulmonary former smoker   breath sounds clear to auscultation       Cardiovascular hypertension,  Rhythm:Regular Rate:Tachycardia  Echo:  1. Left ventricular ejection fraction, by estimation, is 60 to 65%. The  left ventricle has normal function. The left ventricle has no regional  wall motion abnormalities. Left ventricular diastolic parameters are  consistent with Grade I diastolic  dysfunction (impaired relaxation).   2. Right ventricular systolic function is normal. The right ventricular  size is normal.   3. The mitral valve is normal in structure. Trivial mitral valve  regurgitation. No evidence of mitral stenosis.   4. The aortic valve was not well visualized. Aortic valve regurgitation  is not visualized. No aortic stenosis is present.   5. The inferior vena cava is normal in size with greater than 50%  respiratory variability, suggesting right atrial pressure of 3 mmHg.     Neuro/Psych  Neuromuscular disease  negative psych ROS   GI/Hepatic negative GI ROS, Neg liver ROS,,,  Endo/Other  diabetes    Renal/GU negative Renal ROS     Musculoskeletal negative musculoskeletal ROS (+)    Abdominal   Peds  Hematology  (+) Blood dyscrasia, anemia   Anesthesia Other Findings   Reproductive/Obstetrics                              Anesthesia Physical Anesthesia Plan  ASA: 3  Anesthesia Plan: MAC   Post-op Pain Management: Minimal or no pain anticipated   Induction: Intravenous  PONV Risk Score and Plan: 2 and Ondansetron  and Propofol  infusion  Airway Management Planned: Natural Airway  and Nasal Cannula  Additional Equipment: None  Intra-op Plan:   Post-operative Plan:   Informed Consent: I have reviewed the patients History and Physical, chart, labs and discussed the procedure including the risks, benefits and alternatives for the proposed anesthesia with the patient or authorized representative who has indicated his/her understanding and acceptance.       Plan Discussed with: CRNA  Anesthesia Plan Comments:          Anesthesia Quick Evaluation  "

## 2025-01-12 NOTE — Progress Notes (Signed)
 MEWS Progress Note  Patient Details Name: Ashley Clark MRN: [id number] DOB: 06/17/1961 Today's Date: 01/12/2025   MEWS Flowsheet Documentation:  Assess: MEWS Score Temp: 97.6 F (36.4 C) BP: 104/70 MAP (mmHg): 81 Pulse Rate: (!) 104 ECG Heart Rate: (!) 104 Resp: (!) 30 Level of Consciousness: Alert SpO2: 100 % O2 Device: Room Air Patient Activity (if Appropriate): In bed O2 Flow Rate (L/min): 5 L/min Assess: MEWS Score MEWS Temp: 0 MEWS Systolic: 0 MEWS Pulse: 1 MEWS RR: 2 MEWS LOC: 0 MEWS Score: 3 MEWS Score Color: Yellow Assess: SIRS CRITERIA SIRS Temperature : 0 SIRS Respirations : 1 SIRS Pulse: 1 SIRS WBC: 0 SIRS Score Sum : 2     MD aware and seen patient at bedside     Rolin DELENA Sharps 01/12/2025, 7:35 PM

## 2025-01-12 NOTE — Op Note (Signed)
 [facility] Patient Name: Ashley Clark Procedure Date: 01/12/2025 MRN: [id number] Attending MD: Gordy CHRISTELLA Starch , MD, [id number] Date of Birth: April 03, 1961 CSN: [id number] Age: 64 Admit Type: Inpatient Procedure:                Upper GI endoscopy Indications:              Dysphagia, Abnormal UGI series (proximal esophageal                            stricture with impeding barium tablet) Providers:                Gordy CHRISTELLA. Starch, MD, Ozell Pouch, [place], Technician Referring MD:             [facility] Medicines:                Monitored Anesthesia Care Complications:            No immediate complications. Estimated Blood Loss:     Estimated blood loss: none. Procedure:                Pre-Anesthesia Assessment:                           - Prior to the procedure, a History and Physical                            was performed, and patient medications and                            allergies were reviewed. The patient's tolerance of                            previous anesthesia was also reviewed. The risks                            and benefits of the procedure and the sedation                            options and risks were discussed with the patient.                            All questions were answered, and informed consent                            was obtained. Prior Anticoagulants: The patient has                            taken Eliquis  (apixaban ), last dose was 2 days                            prior to procedure. ASA Grade Assessment: III - A  patient with severe systemic disease. After                            reviewing the risks and benefits, the patient was                            deemed in satisfactory condition to undergo the                            procedure.                           After obtaining informed consent, the endoscope was                             passed under direct vision. Throughout the                            procedure, the patient's blood pressure, pulse, and                            oxygen saturations were monitored continuously. The                            [id number]-H190 ([id number]) Olympus endoscope was introduced                            through the mouth, and advanced to the second part                            of duodenum. The upper GI endoscopy was                            accomplished without difficulty. The patient                            tolerated the procedure well. Scope In: Scope Out: Findings:      Patchy, white plaques were found in the entire esophagus.      No endoscopic abnormality was evident in the esophagus to explain the       patient's complaint of dysphagia. It was decided, however, to proceed       with dilation of the entire esophagus. The scope was withdrawn. Dilation       was performed with a Maloney dilator with mild resistance at 50 Fr.      The entire examined stomach was normal.      The examined duodenum was normal. Impression:               - Esophageal plaques were found, consistent with                            candidiasis.                           - No endoscopic esophageal abnormality to explain  patient's dysphagia. Esophagus dilated with 50 Fr.                            Agapito given findings on UGI series (question of                            proximal esophageal stricture).                           - Normal stomach.                           - Normal examined duodenum.                           - No specimens collected. Moderate Sedation:      N/A Recommendation:           - Return patient to hospital ward for ongoing care.                           - Clear liquid diet.                           - Continue present medications after dilation. Can                            advanced diet tomorrow as tolerated.                           -  Fluconazole  400 mg x 1 day, 200 mg x 13 days (IV                            today and change to PO when able). Procedure Code(s):        --- Professional ---                           [phone], Esophagogastroduodenoscopy, flexible,                            transoral; diagnostic, including collection of                            specimen(s) by brushing or washing, when performed                            (separate procedure)                           43450, Dilation of esophagus, by unguided sound or                            bougie, single or multiple passes Diagnosis Code(s):        --- Professional ---                           K22.9, Disease  of esophagus, unspecified                           R13.10, Dysphagia, unspecified                           R93.3, Abnormal findings on diagnostic imaging of                            other parts of digestive tract CPT copyright 2022 American Medical Association. All rights reserved. The codes documented in this report are preliminary and upon coder review may  be revised to meet current compliance requirements. Gordy CHRISTELLA Starch, MD 01/12/2025 2:39:28 PM This report has been signed electronically. Number of Addenda: 0

## 2025-01-12 NOTE — Progress Notes (Signed)
 " PROGRESS NOTE    Ashley Clark  FMW:[id number] DOB: 10-03-1961 DOA: 01/07/2025 PCP: [name], PA    Brief Narrative:   Ashley Clark is a 64 y.o. 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  In the ED, temperature 98.0 F, HR 129, RR 19, BP 90/52, SpO2 100% on room air.  WBC 11.3, hemoglobin 9.3, platelet count 190.  Sodium 128, Tessman 3.6, chloride 92, CO2 25, glucose 105, BUN 7, creatinine 0.42.  AST 49, ALT 29, total bilirubin 1.6.  Lactic acid 1.8.  Urinalysis with large leukocytes, negative nitrite, no bacteria.  TRH consulted for admission for further evaluation and management of dysphagia.  Assessment & Plan:   Dysphagia 2/2 esophageal candidiasis Patient presenting to the ED with 2-week history of poor oral intake, unable to tolerate solids/liquids; and progressive weight loss since Thanksgiving.  Underwent upper GI series on 1/12 with findings concerning for possible proximal esophageal stricture with delay of the barium capsule, no overt abnormalities of the stomach or proximal small bowel.  Gastroenterology was consulted and patient underwent EGD on 01/12/2025 with findings of esophageal plaques consistent with candidiasis, no endoscopic esophageal abnormality to explain dysphagia otherwise, esophagus was dilated. -- Fluconazole  400 mg IV x 1 followed by 200 mg IV daily to complete 14-day course -- Clear liquid diet, will attempt further advancement tomorrow -- Compazine  10 mg IV every 4 hours as needed nausea/vomiting -- Zofran  4 mg IV every 6 hours as needed refractory nausea/vomiting  Hyponatremia Etiology likely secondary to poor oral intake, inability to tolerate oral/liquids due to severe dysphagia as above. -- Na  128>>132>135 -- NS at 100 mL/h -- BMP in a.m.  Hypokalemia Potassium 3.0, will replete. -- Repeat electrolytes in a.m.  Stage IV pancreatic cancer with liver metastasis History of breast cancer s/p lumpectomy Follows with medical oncology, Dr. Lanny outpatient.  Diagnosed November 2024.  Received first-line chemotherapy with Nalrifox but dose was reduced due to poor performance status.  Recent imaging on 10/19/2024 concerning for increased liver and retroperitoneal metastasis and potential new left lung metastasis with small volume ascites; and chemotherapy regimen was changed to gemcitabine  and Abraxane  on 11/02/2024. -- Medical oncology, palliative care following, appreciate assistance -- Further per medical oncology, overall prognosis appears poor  HTN Currently not on antihypertensives.  History of PE on anticoagulation -- Resume Eliquis  tomorrow  Anemia Baseline hemoglobin 10/11. -- Hgb 9.3>7.8>7.9 -- Transfuse for hemoglobin less than 7.0  DM2 Hemoglobin A1c 6.8%.  Diet controlled at baseline.   DVT prophylaxis: SCDs Start: 01/08/25 0355    Code Status: Limited: Do not attempt resuscitation (DNR) -DNR-LIMITED -Do Not Intubate/DNI  Family Communication: Updated multiple family members present at bedside  Disposition Plan:  Level of care: Med-Surg Status is: Inpatient Remains inpatient appropriate because: Will need further advancement of diet, anticipate discharge home likely tomorrow    Consultants:  Medical oncology, Dr. Lanny Palliative care [facility]  Procedures:  Upper GI series 1/12  Antimicrobials:  None   Subjective: Patient seen examined bedside, lying in bed.  Underwent EGD this morning with findings of esophageal candidiasis.  Starting fluconazole .  Multiple family members present.  Patient with no other specific complaints, questions or concerns at this time.  Denies headache, no dizziness, no chest pain,  no palpitations, no shortness  of breath, no abdominal pain.  No acute events overnight per nursing staff.  Objective: Vitals:   01/12/25 1435 01/12/25 1440 01/12/25 1450 01/12/25 1524  BP: 102/67 (!) 90/59 98/61 104/70  Pulse: (!) 105 (!) 109 (!) 103 (!) 104  Resp: (!) 35 (!) 39 (!) 38 (!) 30  Temp:    97.6 F (36.4 C)  TempSrc:      SpO2: 100% 99% 99% 100%  Weight:      Height:       No intake or output data in the 24 hours ending 01/12/25 1603  Filed Weights   01/08/25 0457  Weight: 65.5 kg    Examination:  Physical Exam: GEN: NAD, alert and oriented x 3, chronically ill in appearance HEENT: NCAT, PERRL, EOMI, sclera clear, MMM PULM: CTAB w/o wheezes/crackles, normal respiratory effort, on room air CV: RRR w/o M/G/R GI: abd soft, NTND, + BS MSK: no peripheral edema, moves all extremities independently NEURO: No focal neurological deficit PSYCH: normal mood/affect Integumentary: No concerning rashes/lesions/wounds noted on exposed skin surfaces    Data Reviewed: I have personally reviewed following labs and imaging studies  CBC: Recent Labs  Lab 01/07/25 2149 01/09/25 0342 01/10/25 0048 01/12/25 0405  WBC 11.3* 10.9* 12.4* 16.5*  NEUTROABS 7.8*  --   --   --   HGB 9.3* 7.8* 7.9* 8.0*  HCT 28.7* 24.4* 24.6* 23.9*  MCV 78.8* 80.5 79.9* 78.4*  PLT 190 156 141* 149*   Basic Metabolic Panel: Recent Labs  Lab 01/07/25 2149 01/09/25 0342 01/10/25 0048 01/12/25 0405  NA 128* 130* 132* 135  K 3.6 3.4* 3.4* 3.0*  CL 92* 97* 98 103  CO2 25 24 25 23   GLUCOSE 105* 131* 106* 111*  BUN 7* <5* <5* 6*  CREATININE 0.42* 0.30* 0.36* 0.32*  CALCIUM  9.0 8.1* 8.1* 8.3*  MG  --   --   --  2.0  PHOS  --   --   --  2.4*   GFR: Estimated Creatinine Clearance: 66.3 mL/min (A) (by C-G formula based on SCr of 0.32 mg/dL (L)). Liver Function Tests: Recent Labs  Lab 01/07/25 2149 01/09/25 0342 01/10/25 0048  AST 49* 37 48*  ALT 26 16 18   ALKPHOS 283* 215* 270*  BILITOT 1.6* 1.1 1.3*  PROT 5.9*  4.8* 5.0*  ALBUMIN 3.0* 2.4* 2.5*   No results for input(s): LIPASE, AMYLASE in the last 168 hours. No results for input(s): AMMONIA in the last 168 hours. Coagulation Profile: Recent Labs  Lab 01/07/25 2149  INR 2.0*   Cardiac Enzymes: No results for input(s): CKTOTAL, CKMB, CKMBINDEX, TROPONINI in the last 168 hours. BNP (last 3 results) No results for input(s): PROBNP in the last 8760 hours. HbA1C: No results for input(s): HGBA1C in the last 72 hours. CBG: Recent Labs  Lab 01/07/25 2142  GLUCAP 95   Lipid Profile: No results for input(s): CHOL, HDL, LDLCALC, TRIG, CHOLHDL, LDLDIRECT in the last 72 hours. Thyroid Function Tests: No results for input(s): TSH, T4TOTAL, FREET4, T3FREE, THYROIDAB in the last 72 hours. Anemia Panel: No results for input(s): VITAMINB12, FOLATE, FERRITIN, TIBC, IRON, RETICCTPCT in the last 72 hours. Sepsis Labs: Recent Labs  Lab 01/07/25 2153  LATICACIDVEN 1.8    Recent Results (from the past 240 hours)  Culture, blood (Routine x 2)     Status: None   Collection Time: 01/07/25 10:26 PM   Specimen: Left Antecubital; Blood  Result Value Ref Range Status  Specimen Description   Final    LEFT ANTECUBITAL Performed at [facility], [address], [place], [place] [address]    Special Requests   Final    BOTTLES DRAWN AEROBIC AND ANAEROBIC Blood Culture adequate volume Performed at [facility], [address], [place], [place] [address]    Culture   Final    NO GROWTH 5 DAYS Performed at [facility], [address], [place], [place] [address]    Report Status 01/12/2025 FINAL  Final  Culture, blood (Routine x 2)     Status: None   Collection Time: 01/07/25 10:38 PM   Specimen: Right Antecubital; Blood  Result Value Ref Range Status   Specimen Description   Final    RIGHT ANTECUBITAL Performed at [facility], [address] [place], [place] [address]    Special Requests   Final    BOTTLES DRAWN AEROBIC AND ANAEROBIC Blood Culture results may not be optimal due to an inadequate volume of blood received in culture bottles Performed at [facility], [address] [place], [place] [address]    Culture   Final    NO GROWTH 5 DAYS Performed at [facility], [address], [place], [place] [address]    Report Status 01/12/2025 FINAL  Final  Urine Culture     Status: Abnormal   Collection Time: 01/08/25 12:49 AM   Specimen: Urine, Random  Result Value Ref Range Status   Specimen Description   Final    URINE, RANDOM Performed at [facility], [address], [place], [place] [address]    Special Requests   Final    NONE Reflexed from [phone] Performed at [facility], [address] [place], [place] [address]    Culture MULTIPLE SPECIES PRESENT, SUGGEST RECOLLECTION (A)  Final   Report Status 01/09/2025 FINAL  Final         Radiology Studies: No results found.      Scheduled Meds:  Chlorhexidine  Gluconate Cloth  6 each Topical Daily   feeding supplement  237 mL Oral BID BM   pantoprazole  (PROTONIX ) IV  40 mg Intravenous Q24H   pregabalin   25 mg Oral BID   senna  2 tablet Oral BID   sodium chloride  flush  10-40 mL Intracatheter Q12H   Continuous Infusions:  sodium chloride  100 mL/hr at 01/12/25 1414   sodium chloride  20 mL/hr at 01/11/25 1825   [START ON 01/13/2025] fluconazole  (DIFLUCAN ) IV     fluconazole  (DIFLUCAN ) IV       LOS: 4 days    Time spent: 50 minutes spent on 01/12/2025 caring for this patient face-to-face including chart review, ordering labs/tests, documenting, discussion with nursing staff, consultants, updating family and interview/physical exam    Camellia PARAS Dyke Weible, DO [facility] Available via Epic secure chat 7am-7pm After these hours, please refer to coverage provider listed on  [email or web address] 01/12/2025, 4:03 PM   "

## 2025-01-12 NOTE — Progress Notes (Signed)
 " Daily Progress Note   Patient Name: Ashley Clark       Date: 01/12/2025 DOB: 01-07-1961  Age: 64 y.o. MRN#: [id number] Attending Physician: Austria, Eric J, DO Primary Care Physician: [name], PA Admit Date: 01/07/2025 Length of Stay: 4 days  Reason for Follow-up: Establishing goals of care, Non pain symptom management, and Pain control  Subjective:   CC: Patient noted to be awaiting EGD today.  Following up regarding goals of care and symptom management.  Reviewed EMR documentation including GI note from 01/11/2025 noting plan for EGD today to evaluate for dysphagia symptoms.  Review of CBC from today noting patient's WBCs elevated to 16.5.  At time of EMR review, patient has not required as needed IV Dilaudid  or as needed oxycodone  in past 24 hours.  Patient continues to receive the pregabalin  25 mg twice daily. Patient resting comfortably in bed when seen.  Patient currently awaiting EGD which is planned for later this afternoon.  Noted palliative medicine team continuing to follow along with patient's medical journey.  Objective:   Vital Signs:  BP 127/89   Pulse (!) 121   Temp 98.7 F (37.1 C) (Oral)   Resp 18   Ht 5' 3 (1.6 m)   Wt 65.5 kg   LMP 03/12/2015   SpO2 100%   BMI 25.58 kg/m   Physical Exam: General: NAD, alert, chronically ill-appearing Cardiovascular: Tachycardia noted Respiratory: no increased work of breathing noted, not in respiratory distress Neuro: A&Ox4, following commands easily Psych: appropriately answers all questions  Assessment & Plan:   Assessment: Patient is a 64 year old female with a past medical history of metastatic pancreatic cancer with disease to the liver, diabetes mellitus type 2, hypertension, history of PE on Eliquis , and prior breast cancer status postlumpectomy who was admitted on 01/07/2025 for progressive weakness and lack of p.o. intake.  Since admission, patient has received management for dysphagia and electrolyte  abnormalities.  Palliative medicine team consulted to assist with goals of care and symptom management.  Recommendations/Plan: # Complex medical decision making/goals of care:  - Patient awaiting EGD evaluation today to further assist with workup and management of dysphagia.  Oncology had already discussed with patient on 01/10/2025 that patient is not a candidate for further cancer directed therapies.  Continuing appropriate medical management with hope to reverse conditions if able.  Palliative medicine team continuing to follow along with patient's medical journey.  -  Code Status: Limited: Do not attempt resuscitation (DNR) -DNR-LIMITED -Do Not Intubate/DNI   # Symptom management: Patient is receiving these palliative interventions for symptom management with an intent to improve quality of life.    - Pain, acute on chronic in setting of metastatic pancreatic cancer with disease to the liver; possible neuropathic pain from cancer chemotherapy   - Continue pregabalin  25 mg twice daily   - Continue oxycodone  5 mg every 4 hours as needed   - Continue IV hydromorphone  1 mg every 4 hours as needed breakthrough pain after oral opioid   - Poor oral intake/dysphagia with weight loss/severe protein calorie malnutrition/cancer associated cachexia -GI performing EGD today for workup   - Constipation Last BM in EMR noted 01/11/2025.   - Continue senna 2 tab twice daily   - Continue bisacodyl  10 mg suppository daily as needed  # Psychosocial Support:  - Significant other: Jerona, Mother: Orlean  # Discharge Planning: To Be Determined  Thank you for allowing the palliative care team to participate in the care Marice D  Lair.  Tinnie Radar, DO Palliative Care Provider PMT # [phone]  If patient remains symptomatic despite maximum doses, please call PMT at [phone] between 0700 and 1900. Outside of these hours, please call attending, as PMT does not have night coverage.  Billing based  on MDM: Low Problems addressed: 1 stable acute illness Amount and/or Complexity of Data: Category 1:Review of prior external note(s) from each unique source "

## 2025-01-13 DIAGNOSIS — K222 Esophageal obstruction: Secondary | ICD-10-CM

## 2025-01-13 DIAGNOSIS — R1310 Dysphagia, unspecified: Secondary | ICD-10-CM | POA: Diagnosis not present

## 2025-01-13 DIAGNOSIS — B3781 Candidal esophagitis: Secondary | ICD-10-CM | POA: Diagnosis not present

## 2025-01-13 LAB — BASIC METABOLIC PANEL WITH GFR
Anion gap: 11 (ref 5–15)
BUN: 7 mg/dL — ABNORMAL LOW (ref 8–23)
CO2: 22 mmol/L (ref 22–32)
Calcium: 8.6 mg/dL — ABNORMAL LOW (ref 8.9–10.3)
Chloride: 103 mmol/L (ref 98–111)
Creatinine, Ser: 0.33 mg/dL — ABNORMAL LOW (ref 0.44–1.00)
GFR, Estimated: >60 mL/min
Glucose, Bld: 146 mg/dL — ABNORMAL HIGH (ref 70–99)
Potassium: 3.6 mmol/L (ref 3.5–5.1)
Sodium: 136 mmol/L (ref 135–145)

## 2025-01-13 LAB — CBC
HCT: 26.3 % — ABNORMAL LOW (ref 36.0–46.0)
Hemoglobin: 8.5 g/dL — ABNORMAL LOW (ref 12.0–15.0)
MCH: 25.6 pg — ABNORMAL LOW (ref 26.0–34.0)
MCHC: 32.3 g/dL (ref 30.0–36.0)
MCV: 79.2 fL — ABNORMAL LOW (ref 80.0–100.0)
Platelets: 146 K/uL — ABNORMAL LOW (ref 150–400)
RBC: 3.32 MIL/uL — ABNORMAL LOW (ref 3.87–5.11)
RDW: 18.5 % — ABNORMAL HIGH (ref 11.5–15.5)
WBC: 21.6 K/uL — ABNORMAL HIGH (ref 4.0–10.5)
nRBC: 0.2 % (ref 0.0–0.2)

## 2025-01-13 LAB — MAGNESIUM: Magnesium: 2 mg/dL (ref 1.7–2.4)

## 2025-01-13 LAB — PHOSPHORUS: Phosphorus: 2.6 mg/dL (ref 2.5–4.6)

## 2025-01-13 NOTE — Progress Notes (Signed)
 " Daily Progress Note   Patient Name: Ashley Clark       Date: 01/13/2025 DOB: 04/15/1961  Age: 64 y.o. MRN#: [id number] Attending Physician: Austria, Eric J, DO Primary Care Physician: [name], PA Admit Date: 01/07/2025 Length of Stay: 5 days  Reason for Follow-up: Establishing goals of care, Non pain symptom management, and Pain control  Subjective:   CC: Patient feels pain continues to be improved with Lyrica .  Following up regarding symptom management.  Reviewed hospitalist and GI documentation for today.  Patient has had increase of WBCs to 21.6 and hospitalist suspects this is an the setting of patient's underlying metastatic pancreatic cancer; low suspicion for infectious process other than noted esophageal candidiasis.  Patient did have EGD yesterday which noted candidiasis and performing of esophageal dilation given findings on UGI series though no obvious abnormality was seen.  At time of EMR review in past 24 hours patient has received as needed oxycodone  5 mg x 2 doses.  Patient has also received Compazine  10 mg x 2 doses. Also discussed care with patient's oncologist, Dr. Lanny.  Plan is for patient to follow-up with oncologist in the outpatient setting after discharge.  Presented to bedside to see patient.  Multiple family members present at bedside including patient's husband.  Able to follow-up on patient's symptoms at this time.  Patient feels her neuropathic pain in her lower extremities is well-managed on the Lyrica .  She noted because she had to miss a dose yesterday morning it slightly worsened during the day though now that she is back on oral medications, it is controlled.  Patient noted she did take the as needed oxycodone  to help with pain when it was worse and feels that the 5 mg dose appropriately manages her pain if needed.  She does not feel these medications need to be further adjusted at this time.  Also inquired about the patient's oral intake.  She noted  that after the EGD yesterday, she has been able to eat some Jell-O and drink some liquids without the sensation of it getting caught in her throat which is an improvement.  Patient noted she did have some nausea though the Compazine  helps when needed for this.  Patient is hopeful to have continued progression of her diet with lunch and hopes she will do well.  Spent time providing emotional support via active listening.  Noted palliative medicine team continue to follow the patient's medical journey.  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 intake.  Since admission, patient has received management for dysphagia and electrolyte abnormalities.  Palliative medicine team consulted to assist with goals of care and symptom management.  Recommendations/Plan: # Complex medical decision making/goals of care:  -  Patient underwent EGD on 01/12/2025 to assist with management of dysphagia.  Patient was found to have candidiasis and did have dilation performed; patient feels that her swallowing has been better so far without the sensation of liquids or pills getting stuck in her throat.  Slowly continuing with progression of diet.  Oncology has already discussed  with patient that patient is not a candidate for further cancer directed therapies.  Continuing appropriate medical management with hope to reverse conditions if able.  Discussed with oncologist who noted that plan is for patient to follow-up with oncologist in the outpatient setting after discharge.  Patient has not agreed for hospice support during discussions with oncologist at this time.  Palliative medicine team continuing to follow along with patient's medical journey.  -  Code Status: Limited: Do not attempt resuscitation (DNR) -DNR-LIMITED -Do Not Intubate/DNI   # Symptom management: Patient is receiving these palliative interventions for symptom management with an intent to improve quality of life.   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  Patient also receiving management for candidiasis.   - Constipation Last BM in EMR noted 01/12/2025.   - Continue senna 2 tab twice daily   - Continue bisacodyl  10 mg suppository daily as needed  # Psychosocial Support:  - Significant other: Jerona, Mother: Orlean  # Discharge Planning: To Be Determined - Discussed with oncologist who noted plan is for patient to follow-up in the outpatient setting with oncology after discharge.  Placed consult for outpatient PMT consult at [facility] to continue symptom management and coordination of care.  Thank you for allowing the palliative care team to participate in the  care Tashae D Knierim.  Tinnie Radar, DO Palliative Care Provider PMT # [phone]  If patient remains symptomatic despite maximum doses, please call PMT at [phone] between 0700 and 1900. Outside of these hours, please call attending, as PMT does not have night coverage.  Personally spent 35 minutes in patient care including extensive chart review (labs, imaging, progress/consult notes, vital signs), medically appropraite exam, discussed with treatment team, education to patient, family, and staff, documenting clinical information, medication review and management, coordination of care, and available advanced directive documents.   "

## 2025-01-13 NOTE — Progress Notes (Addendum)
 " PROGRESS NOTE    Ashley Clark  FMW:[id number] DOB: 1961-03-22 DOA: 01/07/2025 PCP: [name], PA    Brief Narrative:   Ashley Clark is a 64 y.o. 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  In the ED, temperature 98.0 F, HR 129, RR 19, BP 90/52, SpO2 100% on room air.  WBC 11.3, hemoglobin 9.3, platelet count 190.  Sodium 128, Tessman 3.6, chloride 92, CO2 25, glucose 105, BUN 7, creatinine 0.42.  AST 49, ALT 29, total bilirubin 1.6.  Lactic acid 1.8.  Urinalysis with large leukocytes, negative nitrite, no bacteria.  TRH consulted for admission for further evaluation and management of dysphagia.  Assessment & Plan:   Dysphagia 2/2 esophageal candidiasis Patient presenting to the ED with 2-week history of poor oral intake, unable to tolerate solids/liquids; and progressive weight loss since Thanksgiving.  Underwent upper GI series on 1/12 with findings concerning for possible proximal esophageal stricture with delay of the barium capsule, no overt abnormalities of the stomach or proximal small bowel.  Gastroenterology was consulted and patient underwent EGD on 01/12/2025 with findings of esophageal plaques consistent with candidiasis, no endoscopic esophageal abnormality to explain dysphagia otherwise, esophagus was dilated. -- Fluconazole  400 mg IV x 1 followed by 200 mg IV daily to complete 14-day course -- Advance to soft diet today -- Compazine  10 mg IV every 4 hours as needed nausea/vomiting -- Zofran  4 mg IV every 6 hours as needed refractory nausea/vomiting  Hyponatremia: Resolved Etiology likely secondary to poor oral intake, inability to tolerate oral/liquids due to severe dysphagia as above. -- Na 128>>132>135>136 -- Continue  to encourage increased oral intake  Hypokalemia Repleted.  Potassium 3.6 today.  Leukocytosis Patient with persistent, uptrending WBC count.  Patient denies any urinary symptoms, treated earlier in the hospitalization for questionable cystitis/UTI.  Remains afebrile.  Denies any respiratory symptoms such as cough or shortness of breath.  Suspect etiology likely related to underlying metastatic pancreatic cancer with liver metastasis with progression and likely suspect cytokine secretion as etiology.  Low suspicion for infectious process other than known esophageal candidiasis as above.  Stage IV pancreatic cancer with liver metastasis History of breast cancer s/p lumpectomy Follows with medical oncology, Dr. Lanny outpatient.  Diagnosed November 2024.  Received first-line chemotherapy with Nalrifox but dose was reduced due to poor performance status.  Recent imaging on 10/19/2024 concerning for increased liver and retroperitoneal metastasis and potential new left lung metastasis with small volume ascites; and chemotherapy regimen was changed to gemcitabine  and Abraxane  on 11/02/2024. -- Medical oncology, palliative care following, appreciate assistance -- Further per medical oncology, overall prognosis appears poor  HTN Currently not on antihypertensives.  History of PE on anticoagulation -- Resume Eliquis    Anemia Baseline hemoglobin 10- 11. -- Hgb 9.3>7.8>7.9>8.5 -- Transfuse for hemoglobin less than 7.0  DM2 Hemoglobin A1c 6.8%.  Diet controlled at baseline.   DVT prophylaxis: SCDs Start: 01/08/25 0355 apixaban  (ELIQUIS ) tablet 5 mg    Code Status: Limited: Do not attempt resuscitation (DNR) -DNR-LIMITED -Do Not Intubate/DNI  Family Communication: No family present at bedside  Disposition Plan:  Level of care: Med-Surg Status is: Inpatient Remains inpatient appropriate because: Advance diet today, if tolerates anticipate discharge home tomorrow.      Consultants:  Medical  oncology, Dr. Lanny Palliative care [facility]  Procedures:  Upper GI series 1/12  Antimicrobials:  None   Subjective: Patient seen examined bedside, lying in bed.  Tolerating clear liquid diet.  Will advance to soft diet today.  If tolerates discussed anticipated discharge home tomorrow.  Patient with no other questions or concerns at this time.   Denies headache, no dizziness, no chest pain, no palpitations, no shortness of breath, no abdominal pain.  No acute events overnight per nursing staff.  Objective: Vitals:   01/13/25 0300 01/13/25 0643 01/13/25 1050 01/13/25 1218  BP: 106/70 99/65 110/65 101/77  Pulse: (!) 113 (!) 112 (!) 117 (!) 119  Resp: (!) 24 (!) 25 (!) 22 (!) 30  Temp: 98 F (36.7 C) 98.2 F (36.8 C) 98.5 F (36.9 C) 97.8 F (36.6 C)  TempSrc:    Oral  SpO2: 100% 98% 100% 99%  Weight:      Height:        Intake/Output Summary (Last 24 hours) at 01/13/2025 1340 Last data filed at 01/12/2025 2125 Gross per 24 hour  Intake 10 ml  Output --  Net 10 ml    Filed Weights   01/08/25 0457  Weight: 65.5 kg    Examination:  Physical Exam: GEN: NAD, alert and oriented x 3, chronically ill in appearance HEENT: NCAT, PERRL, EOMI, sclera clear, MMM PULM: CTAB w/o wheezes/crackles, normal respiratory effort, on room air CV: Tachycardic, regular rhythm w/o M/G/R GI: abd soft, NTND, + BS MSK: no peripheral edema, moves all extremities independently NEURO: No focal neurological deficit PSYCH: normal mood/affect Integumentary: No concerning rashes/lesions/wounds noted on exposed skin surfaces    Data Reviewed: I have personally reviewed following labs and imaging studies  CBC: Recent Labs  Lab 01/07/25 2149 01/09/25 0342 01/10/25 0048 01/12/25 0405 01/13/25 0333  WBC 11.3* 10.9* 12.4* 16.5* 21.6*  NEUTROABS 7.8*  --   --   --   --   HGB 9.3* 7.8* 7.9* 8.0* 8.5*  HCT 28.7* 24.4* 24.6* 23.9* 26.3*  MCV 78.8* 80.5 79.9* 78.4* 79.2*  PLT  190 156 141* 149* 146*   Basic Metabolic Panel: Recent Labs  Lab 01/07/25 2149 01/09/25 0342 01/10/25 0048 01/12/25 0405 01/13/25 0333  NA 128* 130* 132* 135 136  K 3.6 3.4* 3.4* 3.0* 3.6  CL 92* 97* 98 103 103  CO2 25 24 25 23 22   GLUCOSE 105* 131* 106* 111* 146*  BUN 7* <5* <5* 6* 7*  CREATININE 0.42* 0.30* 0.36* 0.32* 0.33*  CALCIUM  9.0 8.1* 8.1* 8.3* 8.6*  MG  --   --   --  2.0 2.0  PHOS  --   --   --  2.4* 2.6   GFR: Estimated Creatinine Clearance: 66.3 mL/min (A) (by C-G formula based on SCr of 0.33 mg/dL (L)). Liver Function Tests: Recent Labs  Lab 01/07/25 2149 01/09/25 0342 01/10/25 0048  AST 49* 37 48*  ALT 26 16 18   ALKPHOS 283* 215* 270*  BILITOT 1.6* 1.1 1.3*  PROT 5.9* 4.8* 5.0*  ALBUMIN 3.0* 2.4* 2.5*   No results for input(s): LIPASE, AMYLASE in the last 168 hours. No results for input(s): AMMONIA in the last 168 hours. Coagulation Profile: Recent Labs  Lab 01/07/25 2149  INR 2.0*   Cardiac Enzymes: No results for input(s): CKTOTAL, CKMB, CKMBINDEX, TROPONINI in the last 168 hours. BNP (last 3 results) No results for input(s): PROBNP in the last 8760 hours. HbA1C: No results for input(s): HGBA1C  in the last 72 hours. CBG: Recent Labs  Lab 01/07/25 2142  GLUCAP 95   Lipid Profile: No results for input(s): CHOL, HDL, LDLCALC, TRIG, CHOLHDL, LDLDIRECT in the last 72 hours. Thyroid Function Tests: No results for input(s): TSH, T4TOTAL, FREET4, T3FREE, THYROIDAB in the last 72 hours. Anemia Panel: No results for input(s): VITAMINB12, FOLATE, FERRITIN, TIBC, IRON, RETICCTPCT in the last 72 hours. Sepsis Labs: Recent Labs  Lab 01/07/25 2153  LATICACIDVEN 1.8    Recent Results (from the past 240 hours)  Culture, blood (Routine x 2)     Status: None   Collection Time: 01/07/25 10:26 PM   Specimen: Left Antecubital; Blood  Result Value Ref Range Status   Specimen Description   Final     LEFT ANTECUBITAL Performed at [facility], [address], [place], [place] [address]    Special Requests   Final    BOTTLES DRAWN AEROBIC AND ANAEROBIC Blood Culture adequate volume Performed at [facility], [address], [place], [place] [address]    Culture   Final    NO GROWTH 5 DAYS Performed at [facility], [address], [place], [place] [address]    Report Status 01/12/2025 FINAL  Final  Culture, blood (Routine x 2)     Status: None   Collection Time: 01/07/25 10:38 PM   Specimen: Right Antecubital; Blood  Result Value Ref Range Status   Specimen Description   Final    RIGHT ANTECUBITAL Performed at [facility], [address], [place], [place] [address]    Special Requests   Final    BOTTLES DRAWN AEROBIC AND ANAEROBIC Blood Culture results may not be optimal due to an inadequate volume of blood received in culture bottles Performed at [facility], [address], [place], [place] [address]    Culture   Final    NO GROWTH 5 DAYS Performed at [facility], [address] [place], [place] [address]    Report Status 01/12/2025 FINAL  Final  Urine Culture     Status: Abnormal   Collection Time: 01/08/25 12:49 AM   Specimen: Urine, Random  Result Value Ref Range Status   Specimen Description   Final    URINE, RANDOM Performed at [facility], [address], [place], [place] [address]    Special Requests   Final    NONE Reflexed from [id number] Performed at [facility], [address], [place], [place] [address]    Culture MULTIPLE SPECIES PRESENT, SUGGEST RECOLLECTION (A)  Final   Report Status 01/09/2025 FINAL  Final         Radiology Studies: No results found.      Scheduled Meds:  apixaban   5 mg Oral BID   Chlorhexidine  Gluconate Cloth  6 each Topical Daily   feeding supplement  237 mL Oral BID BM   pantoprazole   (PROTONIX ) IV  40 mg Intravenous Q24H   pregabalin   25 mg Oral BID   senna  2 tablet Oral BID   sodium chloride  flush  10-40 mL Intracatheter Q12H   Continuous Infusions:  sodium chloride  100 mL/hr at 01/13/25 0058   fluconazole  (DIFLUCAN ) IV       LOS: 5 days    Time spent: 45 minutes spent on 01/13/2025 caring for this patient face-to-face including chart review, ordering labs/tests, documenting, discussion with nursing staff, consultants, updating family and interview/physical exam    Camellia PARAS  Anastyn Ayars, DO [facility] Available via Epic secure chat 7am-7pm After these hours, please refer to coverage provider listed on [email or web address] 01/13/2025, 1:40 PM   "

## 2025-01-13 NOTE — Plan of Care (Signed)

## 2025-01-13 NOTE — Progress Notes (Addendum)
" ° ° ° °  [facility] Progress Note  CC:  Dysphagia and weight loss   Subjective:  S/p EGD yesterday with only candidiasis noted so started on fluconazole .  Tolerated some clear liquids this morning.  Diet has been advanced to soft by the hospitalist.  Three family members at bedside.  Objective:  Vital signs in last 24 hours: Temp:  [97.4 F (36.3 C)-99.1 F (37.3 C)] 98.2 F (36.8 C) (01/16 0643) Pulse Rate:  [100-123] 112 (01/16 0643) Resp:  [21-39] 25 (01/16 0643) BP: (86-121)/(53-85) 99/65 (01/16 0643) SpO2:  [96 %-100 %] 98 % (01/16 0643) Last BM Date : 01/12/25 General:  Alert, ill-appearing, in NAD Heart:  Tachycardic.  Pulm:  CTAB.  No W/R/R. Abdomen:  Soft, but seems somewhat distended.  BS present.  She did not express any TTP.  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 DDX includes radiation induced stricture, candida esophagitis in the setting of chemo, dysmotility  EGD 01/13/2024:- Esophageal plaques were found, consistent with candidiasis. - No endoscopic esophageal abnormality to explain patient' s dysphagia. Esophagus dilated with 50 Fr. Agapito given findings on UGI series (question of proximal esophageal stricture) . - Normal stomach. - Normal  examined duodenum. - No specimens collected.  - Tolerated some clear liquids this morning.  Diet has been advanced to soft.  Will see how she does. - Continue fluconazole  for esophageal candidiasis.  Coagulopathy possibly in the setting of progressive liver mets versus infection  Leukocytosis with WBC count of 21.6 K today   Pulmonary Embolism 2024 On eliquis .   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 Lanny.  Negative genetic testing.  Undergoing chemo with discussion of referral to [facility] for clinical trials   Breast cancer s/p lumpectomy History of radiation    LOS: 5 days   Harlene BIRCH. Daemon Dowty  01/13/2025, 9:12 AM    "

## 2025-01-14 DIAGNOSIS — R1310 Dysphagia, unspecified: Secondary | ICD-10-CM | POA: Diagnosis not present

## 2025-01-14 DIAGNOSIS — B3781 Candidal esophagitis: Secondary | ICD-10-CM | POA: Diagnosis not present

## 2025-01-14 MED ORDER — ALTEPLASE 2 MG IJ SOLR
2.0000 mg | Freq: Once | INTRAMUSCULAR | Status: AC
  Administered 2025-01-14: 2 mg
  Filled 2025-01-14: qty 2

## 2025-01-14 NOTE — Progress Notes (Signed)
 " PROGRESS NOTE    Ashley Clark  FMW:[id number] DOB: 12-10-1961 DOA: 01/07/2025 PCP: [name], PA    Brief Narrative:   Ashley Clark is a 64 y.o. 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  In the ED, temperature 98.0 F, HR 129, RR 19, BP 90/52, SpO2 100% on room air.  WBC 11.3, hemoglobin 9.3, platelet count 190.  Sodium 128, Tessman 3.6, chloride 92, CO2 25, glucose 105, BUN 7, creatinine 0.42.  AST 49, ALT 29, total bilirubin 1.6.  Lactic acid 1.8.  Urinalysis with large leukocytes, negative nitrite, no bacteria.  TRH consulted for admission for further evaluation and management of dysphagia.  Assessment & Plan:   Dysphagia 2/2 esophageal candidiasis Patient presenting to the ED with 2-week history of poor oral intake, unable to tolerate solids/liquids; and progressive weight loss since Thanksgiving.  Underwent upper GI series on 1/12 with findings concerning for possible proximal esophageal stricture with delay of the barium capsule, no overt abnormalities of the stomach or proximal small bowel.  Gastroenterology was consulted and patient underwent EGD on 01/12/2025 with findings of esophageal plaques consistent with candidiasis, no endoscopic esophageal abnormality to explain dysphagia otherwise, esophagus was dilated. -- Fluconazole  400 mg IV x 1 followed by 200 mg IV daily to complete 14-day course -- Diet advanced to soft on 1/16 -- Compazine  10 mg IV every 4 hours as needed nausea/vomiting -- Zofran  4 mg IV every 6 hours as needed refractory nausea/vomiting  Hyponatremia: Resolved Etiology likely secondary to poor oral intake, inability to tolerate oral/liquids due to severe dysphagia as above. -- Na 128>>132>135>136 --  Continue to encourage increased oral intake  Hypokalemia Repleted.    Leukocytosis Patient with persistent, uptrending WBC count.  Patient denies any urinary symptoms, treated earlier in the hospitalization for questionable cystitis/UTI.  Remains afebrile.  Denies any respiratory symptoms such as cough or shortness of breath.  Suspect etiology likely related to underlying metastatic pancreatic cancer with liver metastasis with progression and likely suspect cytokine secretion as etiology.  Low suspicion for infectious process other than known esophageal candidiasis as above.  Stage IV pancreatic cancer with liver metastasis History of breast cancer s/p lumpectomy Follows with medical oncology, Dr. Lanny outpatient.  Diagnosed November 2024.  Received first-line chemotherapy with Nalrifox but dose was reduced due to poor performance status.  Recent imaging on 10/19/2024 concerning for increased liver and retroperitoneal metastasis and potential new left lung metastasis with small volume ascites; and chemotherapy regimen was changed to gemcitabine  and Abraxane  on 11/02/2024. -- Medical oncology, palliative care following, appreciate assistance -- Further per medical oncology, overall prognosis appears poor  HTN Currently not on antihypertensives.  History of PE on anticoagulation -- Resumed Eliquis    Anemia Baseline hemoglobin 10- 11. -- Hgb 9.3>7.8>7.9>8.5 -- Transfuse for hemoglobin less than 7.0  DM2 Hemoglobin A1c 6.8%.  Diet controlled at baseline.   DVT prophylaxis: SCDs Start: 01/08/25 0355 apixaban  (ELIQUIS ) tablet 5 mg    Code Status: Limited: Do not attempt resuscitation (DNR) -DNR-LIMITED -Do Not Intubate/DNI  Family Communication: Updated spouse present at bedside this morning  Disposition Plan:  Level of care: Med-Surg Status is: Inpatient Remains inpatient appropriate because: Diet advanced, anticipate discharge home tomorrow if oral intake improves with continued  toleration of  advance diet.      Consultants:  Medical oncology, Dr. Lanny Palliative care [place] [facility]  Procedures:  Upper GI series 1/12  Antimicrobials:  None   Subjective: Patient seen examined bedside, lying in bed.  Spouse present at bedside.  Diet advanced yesterday, did not eat as much as she thought she could.  Discussed will continue IV fluconazole  today and hopefully her appetite and ability to tolerate diet improved today.  Suspect likely can discharge home tomorrow, patient and spouse agreeable.  No other questions or concerns at this time.  Denies headache, no dizziness, no chest pain, no palpitations, no shortness of breath, no abdominal pain.  No acute events overnight per nursing staff.  Objective: Vitals:   01/13/25 2011 01/13/25 2353 01/14/25 0233 01/14/25 0359  BP: 102/72 99/72 113/80 106/82  Pulse: (!) 125 (!) 130 (!) 119 (!) 115  Resp: (!) 24 (!) 24 (!) 22 (!) 25  Temp: 98.3 F (36.8 C) (!) 97.4 F (36.3 C) 98.1 F (36.7 C) 97.7 F (36.5 C)  TempSrc: Axillary  Oral Oral  SpO2: 99% 100% 100% 100%  Weight:      Height:        Intake/Output Summary (Last 24 hours) at 01/14/2025 1055 Last data filed at 01/13/2025 2152 Gross per 24 hour  Intake 10 ml  Output --  Net 10 ml    Filed Weights   01/08/25 0457  Weight: 65.5 kg    Examination:  Physical Exam: GEN: NAD, alert and oriented x 3, chronically ill in appearance HEENT: NCAT, PERRL, EOMI, sclera clear, MMM PULM: CTAB w/o wheezes/crackles, normal respiratory effort, on room air CV: Tachycardic, regular rhythm w/o M/G/R GI: abd soft, NTND, + BS MSK: no peripheral edema, moves all extremities independently NEURO: No focal neurological deficit PSYCH: normal mood/affect Integumentary: No concerning rashes/lesions/wounds noted on exposed skin surfaces    Data Reviewed: I have personally reviewed following labs and imaging studies  CBC: Recent Labs  Lab 01/07/25 2149  01/09/25 0342 01/10/25 0048 01/12/25 0405 01/13/25 0333  WBC 11.3* 10.9* 12.4* 16.5* 21.6*  NEUTROABS 7.8*  --   --   --   --   HGB 9.3* 7.8* 7.9* 8.0* 8.5*  HCT 28.7* 24.4* 24.6* 23.9* 26.3*  MCV 78.8* 80.5 79.9* 78.4* 79.2*  PLT 190 156 141* 149* 146*   Basic Metabolic Panel: Recent Labs  Lab 01/07/25 2149 01/09/25 0342 01/10/25 0048 01/12/25 0405 01/13/25 0333  NA 128* 130* 132* 135 136  K 3.6 3.4* 3.4* 3.0* 3.6  CL 92* 97* 98 103 103  CO2 25 24 25 23 22   GLUCOSE 105* 131* 106* 111* 146*  BUN 7* <5* <5* 6* 7*  CREATININE 0.42* 0.30* 0.36* 0.32* 0.33*  CALCIUM  9.0 8.1* 8.1* 8.3* 8.6*  MG  --   --   --  2.0 2.0  PHOS  --   --   --  2.4* 2.6   GFR: Estimated Creatinine Clearance: 66.3 mL/min (A) (by C-G formula based on SCr of 0.33 mg/dL (L)). Liver Function Tests: Recent Labs  Lab 01/07/25 2149 01/09/25 0342 01/10/25 0048  AST 49* 37 48*  ALT 26 16 18   ALKPHOS 283* 215* 270*  BILITOT 1.6* 1.1 1.3*  PROT 5.9* 4.8* 5.0*  ALBUMIN  3.0* 2.4* 2.5*   No results for input(s): LIPASE, AMYLASE in the last 168 hours. No results for input(s): AMMONIA in the last 168 hours. Coagulation Profile: Recent Labs  Lab 01/07/25 2149  INR 2.0*   Cardiac  Enzymes: No results for input(s): CKTOTAL, CKMB, CKMBINDEX, TROPONINI in the last 168 hours. BNP (last 3 results) No results for input(s): PROBNP in the last 8760 hours. HbA1C: No results for input(s): HGBA1C in the last 72 hours. CBG: Recent Labs  Lab 01/07/25 2142  GLUCAP 95   Lipid Profile: No results for input(s): CHOL, HDL, LDLCALC, TRIG, CHOLHDL, LDLDIRECT in the last 72 hours. Thyroid Function Tests: No results for input(s): TSH, T4TOTAL, FREET4, T3FREE, THYROIDAB in the last 72 hours. Anemia Panel: No results for input(s): VITAMINB12, FOLATE, FERRITIN, TIBC, IRON, RETICCTPCT in the last 72 hours. Sepsis Labs: Recent Labs  Lab 01/07/25 2153  LATICACIDVEN  1.8    Recent Results (from the past 240 hours)  Culture, blood (Routine x 2)     Status: None   Collection Time: 01/07/25 10:26 PM   Specimen: Left Antecubital; Blood  Result Value Ref Range Status   Specimen Description   Final    LEFT ANTECUBITAL Performed at [facility], [address] [place], [place] [address]    Special Requests   Final    BOTTLES DRAWN AEROBIC AND ANAEROBIC Blood Culture adequate volume Performed at [facility], [address] [place], [place] [address]    Culture   Final    NO GROWTH 5 DAYS Performed at [facility], [address] [place], [place] [address]    Report Status 01/12/2025 FINAL  Final  Culture, blood (Routine x 2)     Status: None   Collection Time: 01/07/25 10:38 PM   Specimen: Right Antecubital; Blood  Result Value Ref Range Status   Specimen Description   Final    RIGHT ANTECUBITAL Performed at [facility], [address] [place], [place] [address]    Special Requests   Final    BOTTLES DRAWN AEROBIC AND ANAEROBIC Blood Culture results may not be optimal due to an inadequate volume of blood received in culture bottles Performed at [facility], [address], [place], [place] [address]    Culture   Final    NO GROWTH 5 DAYS Performed at [facility], [address], [place], [place] [address]    Report Status 01/12/2025 FINAL  Final  Urine Culture     Status: Abnormal   Collection Time: 01/08/25 12:49 AM   Specimen: Urine, Random  Result Value Ref Range Status   Specimen Description   Final    URINE, RANDOM Performed at [facility], [address], [place], [place] [address]    Special Requests   Final    NONE Reflexed from [id number] Performed at [facility], [address], [place], [place] [address]    Culture MULTIPLE SPECIES PRESENT, SUGGEST RECOLLECTION (A)  Final   Report Status  01/09/2025 FINAL  Final         Radiology Studies: No results found.      Scheduled Meds:  apixaban   5 mg Oral BID   Chlorhexidine  Gluconate Cloth  6 each Topical Daily   feeding supplement  237 mL Oral BID BM   pantoprazole  (PROTONIX ) IV  40 mg Intravenous Q24H   pregabalin   25 mg Oral BID   senna  2 tablet Oral BID   sodium chloride  flush  10-40 mL Intracatheter Q12H   Continuous Infusions:  sodium chloride  100 mL/hr at 01/13/25 0058   fluconazole  (DIFLUCAN ) IV 200 mg (01/13/25 1626)     LOS: 6 days  Time spent: 45 minutes spent on 01/14/2025 caring for this patient face-to-face including chart review, ordering labs/tests, documenting, discussion with nursing staff, consultants, updating family and interview/physical exam    Camellia PARAS Elisabet Gutzmer, DO [facility] Available via Epic secure chat 7am-7pm After these hours, please refer to coverage provider listed on [email or web address] 01/14/2025, 10:55 AM   "

## 2025-01-14 NOTE — Plan of Care (Signed)

## 2025-01-15 ENCOUNTER — Encounter (HOSPITAL_COMMUNITY): Payer: Self-pay | Admitting: Internal Medicine

## 2025-01-15 ENCOUNTER — Encounter: Payer: Self-pay | Admitting: Hematology

## 2025-01-15 ENCOUNTER — Other Ambulatory Visit (HOSPITAL_COMMUNITY): Payer: Self-pay

## 2025-01-15 DIAGNOSIS — R1319 Other dysphagia: Secondary | ICD-10-CM | POA: Diagnosis not present

## 2025-01-15 DIAGNOSIS — B3781 Candidal esophagitis: Secondary | ICD-10-CM | POA: Diagnosis not present

## 2025-01-15 DIAGNOSIS — N3000 Acute cystitis without hematuria: Secondary | ICD-10-CM | POA: Diagnosis not present

## 2025-01-15 LAB — BASIC METABOLIC PANEL WITH GFR
Anion gap: 10 (ref 5–15)
BUN: 14 mg/dL (ref 8–23)
CO2: 20 mmol/L — ABNORMAL LOW (ref 22–32)
Calcium: 8.2 mg/dL — ABNORMAL LOW (ref 8.9–10.3)
Chloride: 102 mmol/L (ref 98–111)
Creatinine, Ser: 0.43 mg/dL — ABNORMAL LOW (ref 0.44–1.00)
GFR, Estimated: >60 mL/min
Glucose, Bld: 143 mg/dL — ABNORMAL HIGH (ref 70–99)
Potassium: 3.6 mmol/L (ref 3.5–5.1)
Sodium: 133 mmol/L — ABNORMAL LOW (ref 135–145)

## 2025-01-15 LAB — CBC
HCT: 26.2 % — ABNORMAL LOW (ref 36.0–46.0)
Hemoglobin: 8.5 g/dL — ABNORMAL LOW (ref 12.0–15.0)
MCH: 25.5 pg — ABNORMAL LOW (ref 26.0–34.0)
MCHC: 32.4 g/dL (ref 30.0–36.0)
MCV: 78.7 fL — ABNORMAL LOW (ref 80.0–100.0)
Platelets: 81 K/uL — ABNORMAL LOW (ref 150–400)
RBC: 3.33 MIL/uL — ABNORMAL LOW (ref 3.87–5.11)
RDW: 19.3 % — ABNORMAL HIGH (ref 11.5–15.5)
WBC: 22.6 K/uL — ABNORMAL HIGH (ref 4.0–10.5)
nRBC: 0.6 % — ABNORMAL HIGH (ref 0.0–0.2)

## 2025-01-15 LAB — MAGNESIUM: Magnesium: 1.9 mg/dL (ref 1.7–2.4)

## 2025-01-15 MED ORDER — PANTOPRAZOLE SODIUM 40 MG PO TBEC
40.0000 mg | DELAYED_RELEASE_TABLET | Freq: Every day | ORAL | 0 refills | Status: DC
  Filled 2025-01-15: qty 90, 90d supply, fill #0

## 2025-01-15 MED ORDER — PREGABALIN 25 MG PO CAPS
25.0000 mg | ORAL_CAPSULE | Freq: Two times a day (BID) | ORAL | 0 refills | Status: DC
  Filled 2025-01-15: qty 60, 30d supply, fill #0

## 2025-01-15 MED ORDER — FLUCONAZOLE 100 MG PO TABS
200.0000 mg | ORAL_TABLET | Freq: Every day | ORAL | 0 refills | Status: DC
  Filled 2025-01-15: qty 24, 12d supply, fill #0

## 2025-01-15 MED ORDER — SENNA 8.6 MG PO TABS
2.0000 | ORAL_TABLET | Freq: Two times a day (BID) | ORAL | 0 refills | Status: DC
  Filled 2025-01-15: qty 120, 30d supply, fill #0

## 2025-01-15 MED ORDER — OXYCODONE HCL 5 MG PO TABS
5.0000 mg | ORAL_TABLET | ORAL | 0 refills | Status: DC | PRN
  Filled 2025-01-15: qty 30, 5d supply, fill #0

## 2025-01-15 NOTE — TOC Transition Note (Signed)
 Transition of Care [facility]) - Discharge Note   Patient Details  Name: Ashley Clark MRN: [id number] Date of Birth: 06-30-1961  [facility] of [facility] CM/SW Contact:  Sonda Manuella Quill, RN Phone Number: 01/15/2025, 12:04 PM   Clinical Narrative:    D/C orders received; no IP CM needs.   Final next level of care: Home/Self Care Barriers to Discharge: No Barriers Identified   Patient Goals and CMS Choice Patient states their goals for this hospitalization and ongoing recovery are:: Home CMS Medicare.gov Compare Post Acute Care list provided to:: Patient Choice offered to / list presented to : Patient [place] ownership interest in [facility].provided to:: Patient    Discharge Placement                       Discharge Plan and Services Additional resources added to the After Visit Summary for   In-house Referral: NA Discharge Planning Services: CM Consult            DME Arranged: N/A DME Agency: NA       HH Arranged: NA HH Agency: NA        Social Drivers of Health (SDOH) Interventions SDOH Screenings   Food Insecurity: No Food Insecurity (01/08/2025)  Housing: Low Risk (01/08/2025)  Transportation Needs: No Transportation Needs (01/08/2025)  Utilities: Not At Risk (01/08/2025)  Depression (PHQ2-9): Low Risk (12/14/2024)  Recent Concern: Depression (PHQ2-9) - Medium Risk (09/28/2024)  Tobacco Use: Medium Risk (01/12/2025)     Readmission Risk Interventions    01/12/2025   11:41 AM  Readmission Risk Prevention Plan  Transportation Screening Complete  PCP or Specialist Appt within 3-5 Days Complete  HRI or Home Care Consult Complete  Social Work Consult for Recovery Care Planning/Counseling Complete  Palliative Care Screening Complete  Medication Review [employer]) Complete

## 2025-01-15 NOTE — Plan of Care (Signed)
  Problem: Education: Goal: Knowledge of General Education information will improve Description: Including pain rating scale, medication(s)/side effects and non-pharmacologic comfort measures Outcome: Progressing   Problem: Clinical Measurements: Goal: Ability to maintain clinical measurements within normal limits will improve Outcome: Progressing   Problem: Activity: Goal: Risk for activity intolerance will decrease Outcome: Progressing   Problem: Pain Managment: Goal: General experience of comfort will improve and/or be controlled Outcome: Progressing   Problem: Safety: Goal: Ability to remain free from injury will improve Outcome: Progressing   Problem: Skin Integrity: Goal: Risk for impaired skin integrity will decrease Outcome: Progressing

## 2025-01-15 NOTE — Discharge Summary (Signed)
 " Physician Discharge Summary  Ashley Clark FMW:[id number] DOB: [birth year] DOA: 01/07/2025  PCP: [name], PA  Admit date: 01/07/2025 Discharge date: 01/15/2025  Admitted From: Home Disposition: Home  Recommendations for Outpatient Follow-up:  Follow up with PCP in 1-2 weeks  Follow-up with medical oncology 1-2 weeks, Dr. Lanny Referral placed to palliative care outpatient Started on oxycodone  and Lyrica  for control of her pain symptoms Continue fluconazole  to complete 14-day course for esophageal candidiasis, also started on Protonix  per GI recommendations Continue further goals of care discussion outpatient, overall prognosis remains poor  Home Health: No Equipment/Devices: None  Discharge Condition: Guarded, overall long-term prognosis remains poor CODE STATUS: DNR Diet recommendation: Consistent carbohydrate diet  History of present illness:  Ashley Clark is a 64 y.o. 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 ED on 01/07/2025 with dysphagia, inability to tolerate oral intake over the last 2 weeks.   In the ED, temperature 98.0 F, HR 129, RR 19, BP 90/52, SpO2 100% on room air.  WBC 11.3, hemoglobin 9.3, platelet count 190.  Sodium 128, Tessman 3.6, chloride 92, CO2 25, glucose 105, BUN 7, creatinine 0.42.  AST 49, ALT 29, total bilirubin 1.6.  Lactic acid 1.8.  Urinalysis with large leukocytes, negative nitrite, no bacteria.  TRH consulted for admission for further evaluation and management of dysphagia.  Hospital course:  Dysphagia 2/2 esophageal candidiasis Patient presenting to the ED with 2-week history of poor oral intake, unable to tolerate solids/liquids; and progressive weight loss since Thanksgiving.  Underwent upper GI series on 1/12 with findings concerning for possible proximal esophageal stricture  with delay of the barium capsule, no overt abnormalities of the stomach or proximal small bowel.  Gastroenterology was consulted and patient underwent EGD on 01/12/2025 with findings of esophageal plaques consistent with candidiasis, no endoscopic esophageal abnormality to explain dysphagia otherwise, esophagus was dilated.  Patient was started on IV fluconazole , will continue fluconazole  2 mg p.o. daily to complete 14-day course.   Hyponatremia:  Etiology likely secondary to poor oral intake, inability to tolerate oral/liquids due to severe dysphagia as above.  Supported with IV fluid hydration.  Continue to encourage increased oral intake, supplementation. Hypokalemia Repleted.     Leukocytosis Patient with persistent, uptrending WBC count.  Patient denies any urinary symptoms, treated earlier in the hospitalization for questionable cystitis/UTI.  Remains afebrile.  Denies any respiratory symptoms such as cough or shortness of breath.  Suspect etiology likely related to underlying metastatic pancreatic cancer with liver metastasis with progression and likely suspect cytokine secretion as etiology.  Low suspicion for infectious process other than known esophageal candidiasis as above.   Stage IV pancreatic cancer with liver metastasis History of breast cancer s/p lumpectomy Follows with medical oncology, Dr. Lanny outpatient.  Diagnosed November 2024.  Received first-line chemotherapy with Nalrifox but dose was reduced due to poor performance status.  Recent imaging on 10/19/2024 concerning for increased liver and retroperitoneal metastasis and potential new left lung metastasis with small volume ascites; and chemotherapy regimen was changed to gemcitabine  and Abraxane  on 11/02/2024. Medical oncology and palliative care were consulted and followed during the hospital course.  Overall long-term prognosis remains poor.  Refer to outpatient palliative care.   HTN Currently not on antihypertensives.    History of PE on anticoagulation Continue Eliquis .   Anemia Baseline hemoglobin 10- 11.  Hemoglobin  8.5 at time of discharge.   DM2 Hemoglobin A1c 6.8%.  Diet controlled at baseline.  Discharge Diagnoses:  Principal Problem:   Urinary tract infection Active Problems:   Pancreatic cancer (HCC)   Protein-calorie malnutrition, severe   Decompensated liver disease (HCC)   Hyponatremia   Microcytic anemia   Palliative care by specialist   Decreased oral intake   Constipation   Cancer associated pain   Counseling and coordination of care   Medication management   Drug-induced polyneuropathy   Goals of care, counseling/discussion   Esophageal dysphagia   Abnormal esophagram   Dysphagia   Esophageal candidiasis (HCC)   Stricture and stenosis of esophagus    Discharge Instructions  Discharge Instructions     Amb Referral to Palliative Care   Complete by: As directed    Call MD for:  difficulty breathing, headache or visual disturbances   Complete by: As directed    Call MD for:  difficulty breathing, headache or visual disturbances   Complete by: As directed    Call MD for:  extreme fatigue   Complete by: As directed    Call MD for:  extreme fatigue   Complete by: As directed    Call MD for:  persistant dizziness or light-headedness   Complete by: As directed    Call MD for:  persistant dizziness or light-headedness   Complete by: As directed    Call MD for:  persistant nausea and vomiting   Complete by: As directed    Call MD for:  persistant nausea and vomiting   Complete by: As directed    Call MD for:  severe uncontrolled pain   Complete by: As directed    Call MD for:  severe uncontrolled pain   Complete by: As directed    Call MD for:  temperature >100.4   Complete by: As directed    Call MD for:  temperature >100.4   Complete by: As directed    Increase activity slowly   Complete by: As directed    Increase activity slowly   Complete by: As directed        Allergies as of 01/15/2025       Reactions   Irinotecan  Liposome Shortness Of Breath, Other (See Comments)   (Onivyde , given via IV) Pt had hypersensitivity rxn on 11/11/2023, see hypersensitivity rxn note for details. Pt had s/s of back pain, abdominal discomfort, SOB, flushing, and hypotension.         Medication List     STOP taking these medications    cetirizine 10 MG chewable tablet Commonly known as: ZYRTEC       TAKE these medications    cyanocobalamin 500 MCG tablet Commonly known as: VITAMIN B12 Take 500 mcg by mouth daily.   Eliquis  5 MG Tabs tablet Generic drug: apixaban  Take 1 tablet (5 mg total) by mouth 2 (two) times daily.   fluconazole  100 MG tablet Commonly known as: Diflucan  Take 2 tablets (200 mg total) by mouth daily for 12 days.   metoCLOPramide  10 MG tablet Commonly known as: REGLAN  Take 1 tablet (10 mg total) by mouth every 8 (eight) hours as needed for nausea.   ondansetron  8 MG tablet Commonly known as: ZOFRAN  Take 1 tablet (8 mg total) by mouth every 8 (eight) hours as needed for nausea or vomiting.   oxyCODONE  5 MG immediate release tablet Commonly known as: Oxy IR/ROXICODONE  Take 1 tablet (5 mg total) by mouth every 4 (four) hours as needed  for moderate pain (pain score 4-6) or severe pain (pain score 7-10).   pantoprazole  40 MG tablet Commonly known as: Protonix  Take 1 tablet (40 mg total) by mouth daily.   potassium chloride  SA 20 MEQ tablet Commonly known as: KLOR-CON  M Take 1 tablet (20 mEq total) by mouth 2 (two) times daily.   pregabalin  25 MG capsule Commonly known as: LYRICA  Take 1 capsule (25 mg total) by mouth 2 (two) times daily.   prochlorperazine  10 MG tablet Commonly known as: COMPAZINE  1 tablet as needed for nausea or vomiting Orally every 6 hours   senna 8.6 MG Tabs tablet Commonly known as: SENOKOT Take 2 tablets (17.2 mg total) by mouth 2 (two) times daily.        Follow-up Information      [name], [place], [name]. Schedule an appointment as soon as possible for a visit in 1 week(s).   Specialty: Physician Assistant Contact information: [address] [place] [address] [phone]         [name], MD. Schedule an appointment as soon as possible for a visit in 1 week(s).   Specialties: Hematology, Oncology Contact information: [address] [place] [address] [phone]                Allergies[1]  Consultations: Medical oncology, Dr. Lanny Palliative care [place] [facility]   Procedures/Studies: DG UGI W SMALL BOWEL Result Date: 01/12/2025 CLINICAL DATA:  Decreased appetite, weight loss, nausea, vomiting, malnutrition worsening over 1 year. Concern for possible intestinal obstruction. Request for upper GI series with small-bowel follow-through for further evaluation. Widespread metastatic pancreatic cancer. EXAM: UPPER GI SERIES WITH SMALL BOWEL FOLLOW-THROUGH FLUOROSCOPY: Radiation Exposure Index (as provided by the fluoroscopic device): 2.00 hypoxic mGy Kerma TECHNIQUE: Combined double contrast and single contrast upper GI series using thin barium. Subsequently, serial images of the small bowel were obtained including spot views of the terminal ileum. This exam was performed by Kimble Clas, PA-C, and was supervised and interpreted by Dr. MARLA Kilts. COMPARISON:  12/21/2024 CTs FINDINGS: Esophagus:  Tiny Zenker's diverticulum. Esophageal motility:  Within normal limits. Gastroesophageal reflux:  None visualized. Ingested 13 mm barium tablet: Became stuck in the upper thoracic esophagus, despite multiple sips of water and thin barium. No focal narrowing or obvious stricture seen. Stomach: Normal appearance. No hiatal hernia. Gastric emptying: Normal. Duodenum:  Normal appearance. Other: Contrast seen opacifying the proximal ascending colon. No evidence of obstruction. Normal small bowel transit. No significant small-bowel  narrowing. IMPRESSION: No evidence of small-bowel obstruction or other explanation for patient's symptoms. 13 mm barium tablet has delayed passage in the upper esophagus without cause identified. Electronically Signed   By: Rockey Kilts M.D.   On: 01/12/2025 11:05   DG Fluoro Rm 1-60 Min - No Report Result Date: 01/09/2025 Fluoroscopy was utilized by the requesting physician.  No radiographic interpretation.   DG Fluoro Rm 1-60 Min - No Report Result Date: 01/09/2025 Fluoroscopy was utilized by the requesting physician.  No radiographic interpretation.   DG Fluoro Rm 1-60 Min - No Report Result Date: 01/09/2025 Fluoroscopy was utilized by the requesting physician.  No radiographic interpretation.   DG Fluoro Rm 1-60 Min - No Report Result Date: 01/09/2025 Fluoroscopy was utilized by the requesting physician.  No radiographic interpretation.   DG Fluoro Rm 1-60 Min - No Report Result Date: 01/09/2025 Fluoroscopy was utilized by the requesting physician.  No radiographic interpretation.   DG Fluoro Rm 1-60 Min - No Report  Result Date: 01/09/2025 Fluoroscopy was utilized by the requesting physician.  No radiographic interpretation.   DG Fluoro Rm 1-60 Min - No Report Result Date: 01/09/2025 Fluoroscopy was utilized by the requesting physician.  No radiographic interpretation.   CT CHEST ABDOMEN PELVIS W CONTRAST Result Date: 01/06/2025 EXAM: CT CHEST, ABDOMEN AND PELVIS WITH CONTRAST 12/21/2024 01:44:35 PM TECHNIQUE: CT of the chest, abdomen and pelvis was performed with the administration of 100 mL of iohexol  (OMNIPAQUE ) 300 MG/ML solution. Multiplanar reformatted images are provided for review. Automated exposure control, iterative reconstruction, and/or weight based adjustment of the mA/kV was utilized to reduce the radiation dose to as low as reasonably achievable. COMPARISON: CT chest, abdomen and pelvis 10/19/2024. CLINICAL HISTORY: Pancreatic cancer, assess treatment response. * Tracking  Code: BO * FINDINGS: CHEST: MEDIASTINUM AND LYMPH NODES: Left PICC terminates at the cavoatrial junction. Heart and pericardium are unremarkable. The central airways are clear. No mediastinal, hilar or axillary lymphadenopathy. LUNGS AND PLEURA: Tiny solid 0.3 cm posterior left lower lobe nodule on series 10, image 66 is new. Tiny perifissural 0.3 cm posterior right upper lobe nodule along the major fissure on image 40 is stable. No acute consolidative airspace disease. No additional significant pulmonary nodules. No pleural effusion. No pneumothorax. ABDOMEN AND PELVIS: LIVER: Numerous (greater than 15) hypoenhancing bulky liver masses scattered throughout the liver, increased in size and number. Representative dominant 9.8 x 6.1 cm left liver mass on series 7, image 104, increased from 4.9 x 3.6 cm. Representative new 4.0 x 3.9 cm superior right liver mass on image 73. Representative new 1.3 x 1.1 cm inferior right liver mass on image 106. GALLBLADDER AND BILE DUCTS: Unremarkable. No biliary ductal dilatation. SPLEEN: No acute abnormality. PANCREAS: Hypoenhancing pancreatic body 1.5 x 1.2 cm mass on image 109 is stable. Stable atrophy and mild ductal dilation in the pancreatic tail. ADRENAL GLANDS: No acute abnormality. KIDNEYS, URETERS AND BLADDER: No stones in the kidneys or ureters. No hydronephrosis. No perinephric or periureteral stranding. Urinary bladder is unremarkable. GI AND BOWEL: Stomach demonstrates no acute abnormality. Normal caliber small bowel. Normal appendix. No small or large bowel wall thickening. No significant colonic diverticulosis. REPRODUCTIVE ORGANS: No acute abnormality. PERITONEUM AND RETROPERITONEUM: Small volume pelvic ascites is new. No free air. VASCULATURE: Dilated main pulmonary artery (3.6 cm diameter). Normal course and caliber of the thoracic aorta. Abdominal aorta is normal in caliber. 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 ABDOMINAL AND PELVIS LYMPH NODES: Progressive retroperitoneal adenopathy. Representative newly enlarged 1.0 cm porta hepatis node on image 87, newly enlarged 1.4 cm portacaval node on image 105, newly enlarged left paraaortic nodes up to 1.0 cm on image 122 and newly enlarged left common iliac nodes up to 1.0 cm on image 131. BONES AND SOFT TISSUES: Mild thoracic spondylosis. Minimal lumbar spondylosis. No acute osseous abnormality. No focal soft tissue abnormality. IMPRESSION: 1. Progression of metastatic disease with significantly increased size and number of liver metastases. 2. Progressive retroperitoneal and left common iliac metastatic adenopathy. 3. New tiny 3 mm solid left lower lobe pulmonary nodule suspicious for metastasis. 4. New small volume pelvic ascites. 5. Stable 1.5 cm pancreatic body mass with stable pancreatic tail atrophy and mild pancreatic ductal dilatation. 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 6. Dilated main pulmonary artery, which can be seen with pulmonary hypertension. Electronically signed by: Selinda Blue MD MD 01/06/2025 09:36 AM EST RP Workstation: [id number]  Subjective: Patient seen examined bedside, lying in bed.  No family present.  Tolerating diet.  Discharging home today.  No other questions or concerns at this time.  Denies headache, no dizziness, no chest pain, no shortness of breath, no abdominal pain, no nausea/vomiting/diarrhea.  No acute events overnight per nursing staff.  Discharge Exam: Vitals:   01/14/25 2027 01/15/25 0514  BP: 102/72 110/75  Pulse: 61 63  Resp: 20 18  Temp: 97.6 F (36.4 C) (!) 97.5 F (36.4 C)  SpO2: 100% 100%   Vitals:   01/14/25 1633 01/14/25 1838 01/14/25 2027 01/15/25 0514  BP: 111/76 103/77 102/72 110/75  Pulse: (!) 128 (!) 129 61 63  Resp: (!) 32 (!) 34 20 18  Temp: 97.7 F (36.5 C) (!) 97.3 F (36.3 C) 97.6 F (36.4 C) (!) 97.5 F (36.4 C)  TempSrc:  Oral Oral Oral  SpO2: 98%  99% 100% 100%  Weight:      Height:        Physical Exam: GEN: NAD, alert and oriented x 3, chronically ill in appearance HEENT: NCAT, PERRL, EOMI, sclera clear, MMM PULM: CTAB w/o wheezes/crackles, normal respiratory effort, on room air CV: RRR w/o M/G/R GI: abd soft, NTND, + BS MSK: no peripheral edema, moves all extremities independently NEURO: No focal neurological deficit PSYCH: normal mood/affect Integumentary: No concerning rashes/lesions/wounds noted on exposed skin surfaces    The results of significant diagnostics from this hospitalization (including imaging, microbiology, ancillary and laboratory) are listed below for reference.     Microbiology: Recent Results (from the past 240 hours)  Culture, blood (Routine x 2)     Status: None   Collection Time: 01/07/25 10:26 PM   Specimen: Left Antecubital; Blood  Result Value Ref Range Status   Specimen Description   Final    LEFT ANTECUBITAL Performed at [facility], [address], [place], [place] [address]    Special Requests   Final    BOTTLES DRAWN AEROBIC AND ANAEROBIC Blood Culture adequate volume Performed at [facility], [address], [place], [place] [address]    Culture   Final    NO GROWTH 5 DAYS Performed at [facility], [address] [place], [place] [address]    Report Status 01/12/2025 FINAL  Final  Culture, blood (Routine x 2)     Status: None   Collection Time: 01/07/25 10:38 PM   Specimen: Right Antecubital; Blood  Result Value Ref Range Status   Specimen Description   Final    RIGHT ANTECUBITAL Performed at [facility], [address], [place], [place] [address]    Special Requests   Final    BOTTLES DRAWN AEROBIC AND ANAEROBIC Blood Culture results may not be optimal due to an inadequate volume of blood received in culture bottles Performed at [facility], [address], [place], [place] [address]     Culture   Final    NO GROWTH 5 DAYS Performed at [facility], [address] [place], [place] [address]    Report Status 01/12/2025 FINAL  Final  Urine Culture     Status: Abnormal   Collection Time: 01/08/25 12:49 AM   Specimen: Urine, Random  Result Value Ref Range Status   Specimen Description   Final    URINE, RANDOM Performed at [facility], [address] [place], [place] [address]    Special Requests   Final  NONE Reflexed from [id number] Performed at [facility], [address] [place], [place] [address]    Culture MULTIPLE SPECIES PRESENT, SUGGEST RECOLLECTION (A)  Final   Report Status 01/09/2025 FINAL  Final     Labs: BNP (last 3 results) No results for input(s): BNP in the last 8760 hours. Basic Metabolic Panel: Recent Labs  Lab 01/09/25 0342 01/10/25 0048 01/12/25 0405 01/13/25 0333 01/15/25 0316  NA 130* 132* 135 136 133*  K 3.4* 3.4* 3.0* 3.6 3.6  CL 97* 98 103 103 102  CO2 24 25 23 22  20*  GLUCOSE 131* 106* 111* 146* 143*  BUN <5* <5* 6* 7* 14  CREATININE 0.30* 0.36* 0.32* 0.33* 0.43*  CALCIUM  8.1* 8.1* 8.3* 8.6* 8.2*  MG  --   --  2.0 2.0 1.9  PHOS  --   --  2.4* 2.6  --    Liver Function Tests: Recent Labs  Lab 01/09/25 0342 01/10/25 0048  AST 37 48*  ALT 16 18  ALKPHOS 215* 270*  BILITOT 1.1 1.3*  PROT 4.8* 5.0*  ALBUMIN  2.4* 2.5*   No results for input(s): LIPASE, AMYLASE in the last 168 hours. No results for input(s): AMMONIA in the last 168 hours. CBC: Recent Labs  Lab 01/09/25 0342 01/10/25 0048 01/12/25 0405 01/13/25 0333 01/15/25 0316  WBC 10.9* 12.4* 16.5* 21.6* 22.6*  HGB 7.8* 7.9* 8.0* 8.5* 8.5*  HCT 24.4* 24.6* 23.9* 26.3* 26.2*  MCV 80.5 79.9* 78.4* 79.2* 78.7*  PLT 156 141* 149* 146* 81*   Cardiac Enzymes: No results for input(s): CKTOTAL, CKMB, CKMBINDEX, TROPONINI in the last 168 hours. BNP: Invalid input(s): POCBNP CBG: No results for  input(s): GLUCAP in the last 168 hours. D-Dimer No results for input(s): DDIMER in the last 72 hours. Hgb A1c No results for input(s): HGBA1C in the last 72 hours. Lipid Profile No results for input(s): CHOL, HDL, LDLCALC, TRIG, CHOLHDL, LDLDIRECT in the last 72 hours. Thyroid function studies No results for input(s): TSH, T4TOTAL, T3FREE, THYROIDAB in the last 72 hours.  Invalid input(s): FREET3 Anemia work up No results for input(s): VITAMINB12, FOLATE, FERRITIN, TIBC, IRON, RETICCTPCT in the last 72 hours. Urinalysis    Component Value Date/Time   COLORURINE AMBER (A) 01/08/2025 0049   APPEARANCEUR HAZY (A) 01/08/2025 0049   LABSPEC 1.014 01/08/2025 0049   PHURINE 6.0 01/08/2025 0049   GLUCOSEU NEGATIVE 01/08/2025 0049   HGBUR NEGATIVE 01/08/2025 0049   BILIRUBINUR NEGATIVE 01/08/2025 0049   KETONESUR 20 (A) 01/08/2025 0049   PROTEINUR 30 (A) 01/08/2025 0049   NITRITE NEGATIVE 01/08/2025 0049   LEUKOCYTESUR LARGE (A) 01/08/2025 0049   Sepsis Labs Recent Labs  Lab 01/10/25 0048 01/12/25 0405 01/13/25 0333 01/15/25 0316  WBC 12.4* 16.5* 21.6* 22.6*   Microbiology Recent Results (from the past 240 hours)  Culture, blood (Routine x 2)     Status: None   Collection Time: 01/07/25 10:26 PM   Specimen: Left Antecubital; Blood  Result Value Ref Range Status   Specimen Description   Final    LEFT ANTECUBITAL Performed at [facility], [address] [place], [place] [address]    Special Requests   Final    BOTTLES DRAWN AEROBIC AND ANAEROBIC Blood Culture adequate volume Performed at [facility], [address], [place], [place] [address]    Culture   Final    NO GROWTH 5 DAYS Performed at [facility], [address] [place], [place] [address]  Report Status 01/12/2025 FINAL  Final  Culture, blood (Routine x 2)     Status: None   Collection Time: 01/07/25 10:38 PM   Specimen:  Right Antecubital; Blood  Result Value Ref Range Status   Specimen Description   Final    RIGHT ANTECUBITAL Performed at [facility], [address] [place], [place] [address]    Special Requests   Final    BOTTLES DRAWN AEROBIC AND ANAEROBIC Blood Culture results may not be optimal due to an inadequate volume of blood received in culture bottles Performed at [facility], [address], [place], [place] [address]    Culture   Final    NO GROWTH 5 DAYS Performed at [facility], [address], [place], [place] [address]    Report Status 01/12/2025 FINAL  Final  Urine Culture     Status: Abnormal   Collection Time: 01/08/25 12:49 AM   Specimen: Urine, Random  Result Value Ref Range Status   Specimen Description   Final    URINE, RANDOM Performed at [facility], [address] [place], [place] [address]    Special Requests   Final    NONE Reflexed from [id number] Performed at [facility], [address], [place], [place] [address]    Culture MULTIPLE SPECIES PRESENT, SUGGEST RECOLLECTION (A)  Final   Report Status 01/09/2025 FINAL  Final     Time coordinating discharge: Over 30 minutes  SIGNED:   Camellia PARAS Sayla Golonka, DO  [facility] 01/15/2025, 9:57 AM     [1]  Allergies Allergen Reactions   Irinotecan  Liposome Shortness Of Breath and Other (See Comments)    (Onivyde , given via IV) Pt had hypersensitivity rxn on 11/11/2023, see hypersensitivity rxn note for details. Pt had s/s of back pain, abdominal discomfort, SOB, flushing, and hypotension.    "

## 2025-01-19 ENCOUNTER — Other Ambulatory Visit: Payer: Self-pay

## 2025-01-19 ENCOUNTER — Emergency Department (HOSPITAL_COMMUNITY)

## 2025-01-19 ENCOUNTER — Observation Stay (HOSPITAL_COMMUNITY)
Admission: EM | Admit: 2025-01-19 | Discharge: 2025-01-20 | Disposition: A | Discharge status: Hospice | Attending: Emergency Medicine | Admitting: Emergency Medicine

## 2025-01-19 ENCOUNTER — Encounter (HOSPITAL_COMMUNITY): Payer: Self-pay | Admitting: Emergency Medicine

## 2025-01-19 DIAGNOSIS — C259 Malignant neoplasm of pancreas, unspecified: Principal | ICD-10-CM | POA: Insufficient documentation

## 2025-01-19 DIAGNOSIS — B3781 Candidal esophagitis: Secondary | ICD-10-CM | POA: Diagnosis not present

## 2025-01-19 DIAGNOSIS — D696 Thrombocytopenia, unspecified: Secondary | ICD-10-CM | POA: Diagnosis not present

## 2025-01-19 DIAGNOSIS — E1169 Type 2 diabetes mellitus with other specified complication: Secondary | ICD-10-CM | POA: Diagnosis present

## 2025-01-19 DIAGNOSIS — K831 Obstruction of bile duct: Secondary | ICD-10-CM | POA: Insufficient documentation

## 2025-01-19 DIAGNOSIS — R770 Abnormality of albumin: Secondary | ICD-10-CM | POA: Insufficient documentation

## 2025-01-19 DIAGNOSIS — K7689 Other specified diseases of liver: Secondary | ICD-10-CM | POA: Diagnosis not present

## 2025-01-19 DIAGNOSIS — C229 Malignant neoplasm of liver, not specified as primary or secondary: Secondary | ICD-10-CM | POA: Diagnosis not present

## 2025-01-19 DIAGNOSIS — E1159 Type 2 diabetes mellitus with other circulatory complications: Secondary | ICD-10-CM | POA: Diagnosis present

## 2025-01-19 DIAGNOSIS — K7290 Hepatic failure, unspecified without coma: Secondary | ICD-10-CM | POA: Diagnosis present

## 2025-01-19 DIAGNOSIS — R1319 Other dysphagia: Secondary | ICD-10-CM | POA: Diagnosis present

## 2025-01-19 DIAGNOSIS — D72829 Elevated white blood cell count, unspecified: Secondary | ICD-10-CM | POA: Diagnosis not present

## 2025-01-19 DIAGNOSIS — Z86711 Personal history of pulmonary embolism: Secondary | ICD-10-CM | POA: Insufficient documentation

## 2025-01-19 DIAGNOSIS — D509 Iron deficiency anemia, unspecified: Secondary | ICD-10-CM | POA: Diagnosis not present

## 2025-01-19 DIAGNOSIS — N179 Acute kidney failure, unspecified: Principal | ICD-10-CM | POA: Insufficient documentation

## 2025-01-19 DIAGNOSIS — E785 Hyperlipidemia, unspecified: Secondary | ICD-10-CM | POA: Diagnosis not present

## 2025-01-19 DIAGNOSIS — R1314 Dysphagia, pharyngoesophageal phase: Secondary | ICD-10-CM | POA: Insufficient documentation

## 2025-01-19 DIAGNOSIS — Z79899 Other long term (current) drug therapy: Secondary | ICD-10-CM | POA: Diagnosis not present

## 2025-01-19 DIAGNOSIS — E8729 Other acidosis: Secondary | ICD-10-CM | POA: Diagnosis present

## 2025-01-19 DIAGNOSIS — R627 Adult failure to thrive: Secondary | ICD-10-CM | POA: Insufficient documentation

## 2025-01-19 DIAGNOSIS — K5900 Constipation, unspecified: Secondary | ICD-10-CM | POA: Insufficient documentation

## 2025-01-19 DIAGNOSIS — E871 Hypo-osmolality and hyponatremia: Secondary | ICD-10-CM | POA: Diagnosis not present

## 2025-01-19 DIAGNOSIS — G893 Neoplasm related pain (acute) (chronic): Secondary | ICD-10-CM | POA: Diagnosis not present

## 2025-01-19 DIAGNOSIS — E119 Type 2 diabetes mellitus without complications: Secondary | ICD-10-CM | POA: Insufficient documentation

## 2025-01-19 DIAGNOSIS — I10 Essential (primary) hypertension: Secondary | ICD-10-CM | POA: Insufficient documentation

## 2025-01-19 DIAGNOSIS — C787 Secondary malignant neoplasm of liver and intrahepatic bile duct: Secondary | ICD-10-CM | POA: Diagnosis present

## 2025-01-19 DIAGNOSIS — M79606 Pain in leg, unspecified: Secondary | ICD-10-CM | POA: Diagnosis present

## 2025-01-19 LAB — CBC
HCT: 29.9 % — ABNORMAL LOW (ref 36.0–46.0)
Hemoglobin: 9.9 g/dL — ABNORMAL LOW (ref 12.0–15.0)
MCH: 26.3 pg (ref 26.0–34.0)
MCHC: 33.1 g/dL (ref 30.0–36.0)
MCV: 79.3 fL — ABNORMAL LOW (ref 80.0–100.0)
Platelets: 43 K/uL — ABNORMAL LOW (ref 150–400)
RBC: 3.77 MIL/uL — ABNORMAL LOW (ref 3.87–5.11)
RDW: 23.7 % — ABNORMAL HIGH (ref 11.5–15.5)
WBC: 36.8 K/uL — ABNORMAL HIGH (ref 4.0–10.5)
nRBC: 3.4 % — ABNORMAL HIGH (ref 0.0–0.2)

## 2025-01-19 LAB — BASIC METABOLIC PANEL WITH GFR
Anion gap: 23 — ABNORMAL HIGH (ref 5–15)
BUN: 47 mg/dL — ABNORMAL HIGH (ref 8–23)
CO2: 12 mmol/L — ABNORMAL LOW (ref 22–32)
Calcium: 8.3 mg/dL — ABNORMAL LOW (ref 8.9–10.3)
Chloride: 94 mmol/L — ABNORMAL LOW (ref 98–111)
Creatinine, Ser: 1.4 mg/dL — ABNORMAL HIGH (ref 0.44–1.00)
GFR, Estimated: 42 mL/min — ABNORMAL LOW
Glucose, Bld: 90 mg/dL (ref 70–99)
Potassium: 4.3 mmol/L (ref 3.5–5.1)
Sodium: 129 mmol/L — ABNORMAL LOW (ref 135–145)

## 2025-01-19 LAB — HEPATIC FUNCTION PANEL
ALT: 102 U/L — ABNORMAL HIGH (ref 0–44)
AST: 233 U/L — ABNORMAL HIGH (ref 15–41)
Albumin: 2 g/dL — ABNORMAL LOW (ref 3.5–5.0)
Alkaline Phosphatase: 429 U/L — ABNORMAL HIGH (ref 38–126)
Bilirubin, Direct: 5.3 mg/dL — ABNORMAL HIGH (ref 0.0–0.2)
Indirect Bilirubin: 1.6 mg/dL — ABNORMAL HIGH (ref 0.3–0.9)
Total Bilirubin: 6.9 mg/dL — ABNORMAL HIGH (ref 0.0–1.2)
Total Protein: 4.9 g/dL — ABNORMAL LOW (ref 6.5–8.1)

## 2025-01-19 LAB — GLUCOSE, CAPILLARY
Glucose-Capillary: 79 mg/dL (ref 70–99)
Glucose-Capillary: 112 mg/dL — ABNORMAL HIGH (ref 70–99)

## 2025-01-19 MED ORDER — MIDODRINE HCL 5 MG PO TABS
5.0000 mg | ORAL_TABLET | Freq: Three times a day (TID) | ORAL | Status: AC
  Administered 2025-01-19 (×2): 5 mg via ORAL
  Filled 2025-01-19 (×2): qty 1

## 2025-01-19 MED ORDER — INSULIN ASPART 100 UNIT/ML IJ SOLN
0.0000 [IU] | Freq: Three times a day (TID) | INTRAMUSCULAR | Status: DC
  Administered 2025-01-20: 1 [IU] via SUBCUTANEOUS
  Filled 2025-01-19: qty 1

## 2025-01-19 MED ORDER — PREGABALIN 25 MG PO CAPS
25.0000 mg | ORAL_CAPSULE | Freq: Two times a day (BID) | ORAL | Status: DC
  Administered 2025-01-19 – 2025-01-20 (×3): 25 mg via ORAL
  Filled 2025-01-19 (×3): qty 1

## 2025-01-19 MED ORDER — MIDODRINE HCL 5 MG PO TABS: 5.0000 mg | ORAL_TABLET | ORAL | Status: DC

## 2025-01-19 MED ORDER — FENTANYL CITRATE (PF) 50 MCG/ML IJ SOSY
50.0000 ug | PREFILLED_SYRINGE | Freq: Once | INTRAMUSCULAR | Status: AC
  Administered 2025-01-19: 50 ug via INTRAVENOUS
  Filled 2025-01-19: qty 1

## 2025-01-19 MED ORDER — ACETAMINOPHEN 500 MG PO TABS: 500.0000 mg | ORAL_TABLET | Freq: Four times a day (QID) | ORAL | Status: DC | PRN

## 2025-01-19 MED ORDER — ONDANSETRON HCL 4 MG PO TABS: 4.0000 mg | ORAL_TABLET | Freq: Four times a day (QID) | ORAL | Status: DC | PRN

## 2025-01-19 MED ORDER — APIXABAN 5 MG PO TABS
5.0000 mg | ORAL_TABLET | Freq: Two times a day (BID) | ORAL | Status: DC
  Administered 2025-01-19 (×2): 5 mg via ORAL
  Filled 2025-01-19 (×2): qty 1

## 2025-01-19 MED ORDER — POLYETHYLENE GLYCOL 3350 17 G PO PACK: 17.0000 g | PACK | Freq: Every day | ORAL | Status: DC | PRN

## 2025-01-19 MED ORDER — HYDROMORPHONE HCL 1 MG/ML IJ SOLN
1.0000 mg | INTRAMUSCULAR | Status: DC | PRN
  Administered 2025-01-19: 1 mg via INTRAVENOUS
  Filled 2025-01-19: qty 1

## 2025-01-19 MED ORDER — METOCLOPRAMIDE HCL 10 MG PO TABS: 10.0000 mg | ORAL_TABLET | Freq: Every evening | ORAL | Status: DC

## 2025-01-19 MED ORDER — FLUCONAZOLE 100 MG PO TABS
200.0000 mg | ORAL_TABLET | Freq: Every day | ORAL | Status: DC
  Administered 2025-01-19 – 2025-01-20 (×2): 200 mg via ORAL
  Filled 2025-01-19: qty 2
  Filled 2025-01-19: qty 1

## 2025-01-19 MED ORDER — SODIUM CHLORIDE 0.9 % IV BOLUS
500.0000 mL | Freq: Once | INTRAVENOUS | Status: AC
  Administered 2025-01-19: 500 mL via INTRAVENOUS

## 2025-01-19 MED ORDER — OXYCODONE HCL 5 MG PO TABS: 5.0000 mg | ORAL_TABLET | ORAL | Status: DC | PRN

## 2025-01-19 MED ORDER — ACETAMINOPHEN 650 MG RE SUPP: 325.0000 mg | RECTAL | Status: DC | PRN

## 2025-01-19 MED ORDER — ALBUMIN HUMAN 25 % IV SOLN: 50.0000 g | Freq: Once | INTRAVENOUS | Status: DC

## 2025-01-19 MED ORDER — LIDOCAINE VISCOUS HCL 2 % MT SOLN: 15.0000 mL | OROMUCOSAL | Status: DC | PRN

## 2025-01-19 MED ORDER — SODIUM CHLORIDE 0.9 % IV SOLN: INTRAVENOUS | Status: DC

## 2025-01-19 MED ORDER — DEXTROSE-SODIUM CHLORIDE 5-0.9 % IV SOLN: INTRAVENOUS | Status: DC

## 2025-01-19 MED ORDER — LABETALOL HCL 5 MG/ML IV SOLN: 10.0000 mg | INTRAVENOUS | Status: DC | PRN

## 2025-01-19 MED ORDER — PANTOPRAZOLE SODIUM 40 MG PO TBEC
40.0000 mg | DELAYED_RELEASE_TABLET | Freq: Two times a day (BID) | ORAL | Status: DC
  Administered 2025-01-19 – 2025-01-20 (×3): 40 mg via ORAL
  Filled 2025-01-19 (×3): qty 1

## 2025-01-19 MED ORDER — ENSURE PLUS HIGH PROTEIN PO LIQD
237.0000 mL | Freq: Three times a day (TID) | ORAL | Status: DC
  Administered 2025-01-19: 237 mL via ORAL

## 2025-01-19 MED ORDER — ONDANSETRON HCL 4 MG/2ML IJ SOLN: 4.0000 mg | Freq: Four times a day (QID) | INTRAMUSCULAR | Status: DC | PRN

## 2025-01-19 MED ORDER — DEXTROSE 50 % IV SOLN: 25.0000 g | INTRAVENOUS | Status: DC | PRN

## 2025-01-19 MED ORDER — DEXTROSE-SODIUM CHLORIDE 5-0.9 % IV SOLN: INTRAVENOUS | Status: AC

## 2025-01-19 MED ORDER — SENNOSIDES-DOCUSATE SODIUM 8.6-50 MG PO TABS
1.0000 | ORAL_TABLET | Freq: Two times a day (BID) | ORAL | Status: DC
  Administered 2025-01-19 – 2025-01-20 (×3): 1 via ORAL
  Filled 2025-01-19 (×3): qty 1

## 2025-01-19 NOTE — Progress Notes (Signed)
" °   01/19/25 2221  Assess: MEWS Score  Temp (!) 97.1 F (36.2 C)  BP (!) 78/56  MAP (mmHg) (!) 63  Pulse Rate 100  Resp 16  SpO2 100 %  Assess: MEWS Score  MEWS Temp 0  MEWS Systolic 2  MEWS Pulse 0  MEWS RR 0  MEWS LOC 0  MEWS Score 2  MEWS Score Color Yellow  Assess: if the MEWS score is Yellow or Red  Were vital signs accurate and taken at a resting state? Yes  Does the patient meet 2 or more of the SIRS criteria? No  MEWS guidelines implemented  Yes, yellow  Treat  MEWS Interventions Considered administering scheduled or prn medications/treatments as ordered  Take Vital Signs  Increase Vital Sign Frequency  Yellow: Q2hr x1, continue Q4hrs until patient remains green for 12hrs  Escalate  MEWS: Escalate Yellow: Discuss with charge nurse and consider notifying provider and/or RRT  Notify: Charge Nurse/RN  Name of Charge Nurse/RN Notified [place], RN  Provider Notification  Provider Name/Title Lavanda Horns, NP  Date Provider Notified 01/19/25  Time Provider Notified 2230  Method of Notification Face-to-face  Notification Reason Change in status  Provider response See new orders  Date of Provider Response 01/19/25  Time of Provider Response 2231  Assess: SIRS CRITERIA  SIRS Temperature  0  SIRS Respirations  0  SIRS Pulse 1  SIRS WBC 0  SIRS Score Sum  1   Administered IVF NS bolus and tab midodrine  5mg . Will continue to monitor.  "

## 2025-01-19 NOTE — H&P (Signed)
 " History and Physical    Patient: Ashley Clark FMW:[id number] DOB: 1961-11-13 DOA: 01/19/2025 DOS: the patient was seen and examined on 01/19/2025 PCP: [name], PA  Patient coming from: Home  Chief Complaint:  Chief Complaint  Patient presents with   Leg Pain   HPI: [name] is a 64 y.o. 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  Her appetite and overall oral intake is decreased.  She denied fever, chills, rhinorrhea, sore throat, wheezing or hemoptysis.  No chest pain, palpitations, diaphoresis, PND, orthopnea or pitting edema of the lower extremities.  No emesis, diarrhea, melena or hematochezia, but has been constipated for 1 to 2 weeks due to poor oral intake.  No flank pain, dysuria, frequency or hematuria.  However, she has been urinating less than usual.  Her urine looks dark, but her total bilirubin is increased.  No polyuria, polydipsia, polyphagia or blurred vision.   Lab work: CBC showed a white count of 36.8, hemoglobin 9.9 g/dL and platelets 43.  BMP shows sodium 129, potassium 4.3, chloride 94 and CO2 12 mmol/L with an anion gap of 23.  Glucose 90, BUN 47, creatinine 1.40 and calcium  8.3 mg/dL.  LFTs showed total protein 4.9 albumin  2.0 g/dL, AST 766, ALT 897 and alkaline phosphatase 429 units/L total bilirubin 6.9 and direct bilirubin 5.3 mg/dL.  Imaging: Portable 1 view chest radiograph showing lower lung volumes with increased elevation of the right hemidiaphragm.  ED course: Initial vital signs were temperature 97.4 F, pulse Otting 10, respiration 20, BP 82/59  mmHg and O2 sat 100% on room air.  The patient received fentanyl  50 mcg IVP x 1.  I added normal saline 500 mL bolus x 2.   Review of Systems: As mentioned in the history of present illness. All other systems reviewed and are negative. Past Medical History:  Diagnosis Date   Acute pulmonary embolism (HCC) 10/29/2023   Allergy    Back pain    Cancer (HCC) 2019   left breast cancer-last radiation Dec.2019   Diabetes mellitus without complication [facility])    Medical history non-contributory    Personal history of radiation therapy    Port-A-Cath in place - removed 12-11-2023 due to port-a-cath infection. 11/13/2023   Prediabetes    Tiredness    Vitamin D  deficiency    Past Surgical History:  Procedure Laterality Date   BREAST BIOPSY Left 08/04/2018   x3   BREAST LUMPECTOMY Left    09-29-18   BREAST LUMPECTOMY WITH RADIOACTIVE SEED AND SENTINEL LYMPH NODE BIOPSY Left 09/29/2018   Procedure: LEFT BREAST LUMPECTOMY WITH RADIOACTIVE SEED AND SENTINEL LYMPH NODE BIOPSY;  Surgeon: [name], MD;  Location: [facility];  Service: General;  Laterality: Left;   COLONOSCOPY  2016   ESOPHAGOGASTRODUODENOSCOPY N/A 01/12/2025   Procedure: EGD (ESOPHAGOGASTRODUODENOSCOPY);  Surgeon: Albertus Gordy HERO, MD;  Location: [facility];  Service: Gastroenterology;  Laterality: N/A;   IR CV LINE INJECTION  11/30/2024   IR IMAGING GUIDED PORT INSERTION  11/03/2023   IR PATIENT EVAL TECH 0-60 MINS  12/18/2023   IR PATIENT EVAL TECH 0-60 MINS  12/24/2023   IR PATIENT EVAL TECH 0-60  MINS  12/28/2023   IR PATIENT EVAL TECH 0-60 MINS  01/04/2024   IR PATIENT EVAL TECH 0-60 MINS  01/13/2024   IR PATIENT EVAL TECH 0-60 MINS  01/22/2024   IR RADIOLOGIST EVAL & MGMT  12/15/2023   IR REMOVAL TUN ACCESS W/ PORT W/O FL MOD SED  12/11/2023   MALONEY DILATION  01/12/2025   Procedure: DILATION, ESOPHAGUS, USING MALONEY DILATOR;  Surgeon: Albertus Gordy HERO, MD;  Location: [facility];  Service: Gastroenterology;;    POLYPECTOMY     WISDOM TOOTH EXTRACTION     Social History:  reports that she quit smoking about 9 years ago. Her smoking use included cigarettes. She smoked an average of 0.3 packs per day. She has never used smokeless tobacco. She reports that she does not currently use alcohol after a past usage of about 4.0 standard drinks of alcohol per week. She reports that she does not use drugs.  Allergies[1]  Family History  Problem Relation Age of Onset   Breast cancer Mother 24   Colon polyps Mother    Colon cancer Neg Hx    Rectal cancer Neg Hx    Stomach cancer Neg Hx    Esophageal cancer Neg Hx     Prior to Admission medications  Medication Sig Start Date End Date Taking? Authorizing Provider  apixaban  (ELIQUIS ) 5 MG TABS tablet Take 1 tablet (5 mg total) by mouth 2 (two) times daily. 08/31/24   [name], MD  cyanocobalamin (VITAMIN B12) 500 MCG tablet Take 500 mcg by mouth daily.    [provider]  fluconazole  (DIFLUCAN ) 100 MG tablet Take 2 tablets (200 mg total) by mouth daily for 12 days. 01/15/25 01/27/25  [place], Eric J, DO  metoCLOPramide  (REGLAN ) 10 MG tablet Take 1 tablet (10 mg total) by mouth every 8 (eight) hours as needed for nausea. 11/02/24   [name], MD  ondansetron  (ZOFRAN ) 8 MG tablet Take 1 tablet (8 mg total) by mouth every 8 (eight) hours as needed for nausea or vomiting. 10/26/24   [name], MD  oxyCODONE  (OXY IR/ROXICODONE ) 5 MG immediate release tablet Take 1 tablet (5 mg total) by mouth every 4 (four) hours as needed for moderate pain (pain score 4-6) or severe pain (pain score 7-10). 01/15/25   [place], Camellia PARAS, DO  pantoprazole  (PROTONIX ) 40 MG tablet Take 1 tablet (40 mg total) by mouth daily. 01/15/25 04/15/25  [place], Eric J, DO  potassium chloride  SA (KLOR-CON  M) 20 MEQ tablet Take 1 tablet (20 mEq total) by mouth 2 (two) times daily. 11/02/24   [name], MD  pregabalin  (LYRICA ) 25 MG capsule Take 1 capsule (25 mg total) by mouth 2 (two) times daily.  01/15/25 04/15/25  [place], Camellia PARAS, DO  prochlorperazine  (COMPAZINE ) 10 MG tablet 1 tablet as needed for nausea or vomiting Orally every 6 hours    [provider]  senna (SENOKOT) 8.6 MG TABS tablet Take 2 tablets (17.2 mg total) by mouth 2 (two) times daily. 01/15/25   [place], Eric J, DO    Physical Exam: Vitals:   01/19/25 0630 01/19/25 0645 01/19/25 0700 01/19/25 0838  BP: 94/62   (!) 143/119  Pulse: (!) 111 (!) 109 (!) 107 (!) 106  Resp:    19  Temp:    (!) 97.4 F (36.3 C)  TempSrc:    Axillary  SpO2: 100% 100% 100% 100%   Physical Exam Vitals and nursing note reviewed.  Constitutional:      General:  She is awake. She is not in acute distress.    Appearance: She is not ill-appearing.  HENT:     Head: Normocephalic.     Nose: No rhinorrhea.     Mouth/Throat:     Mouth: Mucous membranes are dry.  Eyes:     General: Scleral icterus present.     Pupils: Pupils are equal, round, and reactive to light.  Neck:     Vascular: No JVD.  Cardiovascular:     Rate and Rhythm: Normal rate and regular rhythm.     Heart sounds: S1 normal and S2 normal.  Pulmonary:     Effort: Pulmonary effort is normal.     Breath sounds: Normal breath sounds. No wheezing, rhonchi or rales.  Abdominal:     General: Bowel sounds are normal. There is no distension.     Palpations: Abdomen is soft.     Tenderness: There is abdominal tenderness. There is no right CVA tenderness, left CVA tenderness or guarding.  Musculoskeletal:     Cervical back: Neck supple.     Right lower leg: 3+ Pitting Edema present.     Left lower leg: 3+ Pitting Edema present.  Skin:    General: Skin is warm and dry.     Coloration: Skin is jaundiced.  Neurological:     General: No focal deficit present.     Mental Status: She is alert and oriented to person, place, and time.  Psychiatric:        Mood and Affect: Mood normal.        Behavior: Behavior normal. Behavior is cooperative.     Data  Reviewed:  Results are pending, will review when available.  12/14/2023 echocardiogram report. IMPRESSIONS:   1. Left ventricular ejection fraction, by estimation, is 60 to 65%. The  left ventricle has normal function. The left ventricle has no regional  wall motion abnormalities. Left ventricular diastolic parameters are  consistent with Grade I diastolic  dysfunction (impaired relaxation).   2. Right ventricular systolic function is normal. The right ventricular  size is normal.   3. The mitral valve is normal in structure. Trivial mitral valve  regurgitation. No evidence of mitral stenosis.   4. The aortic valve was not well visualized. Aortic valve regurgitation  is not visualized. No aortic stenosis is present.   5. The inferior vena cava is normal in size with greater than 50%  respiratory variability, suggesting right atrial pressure of 3 mmHg.   Conclusion(s)/Recommendation(s): Very limited images due to poor sound  wave transmission. Valves not seen well but look grossly normal. However,  if clinical suspicion for endocarditis is high would recommend TEE to  further evalaute.   EKG: Vent. rate 108 BPM  PR interval 126 ms  QRS duration 85 ms QT/QTcB 356/478 ms P-R-T axes 47 37 51 Sinus tachycardia  Assessment and Plan: Principal Problem:   AKI (acute kidney injury) Observation/MedSurg. Continue IV fluids. Avoid hypotension. - Albumin  50 g IVPB x 1. -Midodrine  5 mg p.o. twice daily x 2 doses. Avoid nephrotoxins. Monitor intake and output. Monitor renal function electrolytes.  Active Problems:   High anion gap metabolic acidosis Multifactorial. -AKI -Fasting ketosis. -Decreased liver clearance. Continue AKI treatment as above. Liberalize oral intake. Will hydrate with D5 NS at 100 mL an hour.    Hyponatremia Due to poor oral intake. Continue IV fluids as above. Follow sodium level in AM.    Decompensated liver disease (HCC) In the setting of liver  mets.  Supportive care.    Diabetes mellitus (HCC) CBG monitoring with RI SS for fasting ketosis treatment. Given poor nutritional state, will liberalize diet for now.    Hyperlipidemia associated with type 2 diabetes mellitus (HCC) Will defer treatment given advanced pancreatic cancer.    Hypertension associated with type 2 diabetes mellitus (HCC) Not on antihypertensives after weight loss.    History of pulmonary embolism Continue apixaban  5 mg p.o. twice daily.    Leukocytosis  In the setting of pancreatic cancer. Monitor WBC.    Macrocytic anemia Monitor H&H. Transfuse as needed if patient desires.    Thrombocytopenia  Monitor platelet count.    Pancreatic cancer [facility]) With associated:   Metastasis to liver [facility]) Did not respond to chemotherapy. Palliative care consult requested.    Esophageal dysphagia In the setting of:   Esophageal candidiasis (HCC) Continue Diflucan  200 mg p.o. daily. Follow-up LFTs in AM.    Constipation Secondary to poor nutrition. As needed stool softeners or MiraLAX .    Advance Care Planning:   Code Status: Limited: Do not attempt resuscitation (DNR) -DNR-LIMITED -Do Not Intubate/DNI    Consults: Palliative care consult.  Family Communication: Her spouse and her mother were at bedside.  Severity of Illness: The appropriate patient status for this patient is OBSERVATION. Observation status is judged to be reasonable and necessary in order to provide the required intensity of service to ensure the patient's safety. The patient's presenting symptoms, physical exam findings, and initial radiographic and laboratory data in the context of their medical condition is felt to place them at decreased risk for further clinical deterioration. Furthermore, it is anticipated that the patient will be medically stable for discharge from the hospital within 2 midnights of admission.   Author: Alm Dorn Castor, MD 01/19/2025 9:03 AM  For on call  review [email or web address].   This document was prepared using Dragon voice recognition software and may contain some unintended transcription errors.      [1]  Allergies Allergen Reactions   Irinotecan  Liposome Shortness Of Breath and Other (See Comments)    (Onivyde , given via IV) Pt had hypersensitivity rxn on 11/11/2023, see hypersensitivity rxn note for details. Pt had s/s of back pain, abdominal discomfort, SOB, flushing, and hypotension.    "

## 2025-01-19 NOTE — ED Provider Notes (Signed)
 Patient signed out to me by Dr. Midge pending results of workup..  Patient appears to be dehydrated and also still has ongoing worsening pain.  Does have a significant leukocytosis but old records reviewed and this has been uptrending felt to be related to her underlying cancer.  Will admit to the hospital   Ashley Faden, MD 01/19/25 [phone]

## 2025-01-19 NOTE — ED Notes (Signed)
 Call to floor to notify that pt is coming up

## 2025-01-19 NOTE — Plan of Care (Signed)
   Problem: Coping: Goal: Ability to adjust to condition or change in health will improve Outcome: Progressing   Problem: Fluid Volume: Goal: Ability to maintain a balanced intake and output will improve Outcome: Progressing   Problem: Health Behavior/Discharge Planning: Goal: Ability to identify and utilize available resources and services will improve Outcome: Progressing

## 2025-01-19 NOTE — Progress Notes (Signed)
 Ashley Clark   DOB:08/13/1961   FM#:[id number]   RDW#:[id number]  Medical oncology follow-up note  Subjective: Patient is well-known to me, under my care for her metastatic pancreatic cancer.  She was recently hospitalized for dysphagia secondary to esophageal candidiasis.  She was discharged home on January 18, still not able to swallow, is only able to drink small amount of liquid.  She had a fall at home.  She presented to ED with worsening leg pain.  She looks lethargic, arousable, multiple family members were in the room.  Objective:  Vitals:   01/19/25 1145 01/19/25 1225  BP: (!) 83/47 (!) 109/46  Pulse: 97 98  Resp: 18 14  Temp:  (!) 97.4 F (36.3 C)  SpO2: 100% 100%    Body mass index is 28.08 kg/m.  Intake/Output Summary (Last 24 hours) at 01/19/2025 1758 Last data filed at 01/19/2025 1400 Gross per 24 hour  Intake 2288.61 ml  Output 0 ml  Net 2288.61 ml     Sclerae icteric  Oropharynx clear  No peripheral adenopathy  MSK no focal spinal tenderness  Neuro nonfocal, lethargic    CBG (last 3)  Recent Labs    01/19/25 1229 01/19/25 1631  GLUCAP 79 112*     Labs:  Lab Results  Component Value Date   WBC 36.8 (H) 01/19/2025   HGB 9.9 (L) 01/19/2025   HCT 29.9 (L) 01/19/2025   MCV 79.3 (L) 01/19/2025   PLT 43 (L) 01/19/2025   NEUTROABS 7.8 (H) 01/07/2025     Urine Studies No results for input(s): UHGB, CRYS in the last 72 hours.  Invalid input(s): UACOL, UAPR, USPG, UPH, UTP, UGL, UKET, UBIL, UNIT, UROB, ULEU, UEPI, UWBC, CORINN JERROL BURNS [place], [place]  Basic Metabolic Panel: Recent Labs  Lab 01/13/25 0333 01/15/25 0316 01/19/25 0653  NA 136 133* 129*  K 3.6 3.6 4.3  CL 103 102 94*  CO2 22 20* 12*  GLUCOSE 146* 143* 90  BUN 7* 14 47*  CREATININE 0.33* 0.43* 1.40*  CALCIUM  8.6* 8.2* 8.3*  MG 2.0 1.9  --   PHOS 2.6  --   --    GFR Estimated Creatinine Clearance: 41.1 mL/min (A) (by C-G formula based on  SCr of 1.4 mg/dL (H)). Liver Function Tests: Recent Labs  Lab 01/19/25 0653  AST 233*  ALT 102*  ALKPHOS 429*  BILITOT 6.9*  PROT 4.9*  ALBUMIN  2.0*   No results for input(s): LIPASE, AMYLASE in the last 168 hours. No results for input(s): AMMONIA in the last 168 hours. Coagulation profile No results for input(s): INR, PROTIME in the last 168 hours.   CBC: Recent Labs  Lab 01/13/25 0333 01/15/25 0316 01/19/25 0653  WBC 21.6* 22.6* 36.8*  HGB 8.5* 8.5* 9.9*  HCT 26.3* 26.2* 29.9*  MCV 79.2* 78.7* 79.3*  PLT 146* 81* 43*   Cardiac Enzymes: No results for input(s): CKTOTAL, CKMB, CKMBINDEX, TROPONINI in the last 168 hours. BNP: Invalid input(s): POCBNP CBG: Recent Labs  Lab 01/19/25 1229 01/19/25 1631  GLUCAP 79 112*   D-Dimer No results for input(s): DDIMER in the last 72 hours. Hgb A1c No results for input(s): HGBA1C in the last 72 hours. Lipid Profile No results for input(s): CHOL, HDL, LDLCALC, TRIG, CHOLHDL, LDLDIRECT in the last 72 hours. Thyroid function studies No results for input(s): TSH, T4TOTAL, T3FREE, THYROIDAB in the last 72 hours.  Invalid input(s): FREET3 Anemia work up No results for input(s): VITAMINB12, FOLATE, FERRITIN, TIBC, IRON, RETICCTPCT in the  last 72 hours. Microbiology No results found for this or any previous visit (from the past 240 hours).     Studies:  DG Chest Portable 1 View Result Date: 01/19/2025 EXAM: 1 VIEW XRAY OF THE CHEST 01/19/2025 06:33:00 AM COMPARISON: Prior study from December 2025. CLINICAL HISTORY: 64 year old female with shortness of breath and metastatic pancreatic cancer. FINDINGS: LUNGS AND PLEURA: Lower lung volumes. Elevation of the right hemidiaphragm has progressed. Extensive liver metastases demonstrated last month. No focal pulmonary opacity. No pleural effusion. No pneumothorax. HEART AND MEDIASTINUM: No acute abnormality of the cardiac and  mediastinal silhouettes. BONES AND SOFT TISSUES: No acute osseous abnormality. IMPRESSION: 1. Lower lung volumes with increased elevation of the right hemidiaphragm. Electronically signed by: Helayne Hurst MD 01/19/2025 06:36 AM EST RP Workstation: [id number]    Assessment: 64 y.o.  Failure to thrive  New onset jaundice, secondary to liver metastasis  dysphagia secondary to esophageal candidiasis Hyponatremia  metastatic pancreatic cancer, with cancer progression on recent CT scan. History of PE on anticoagulation Anemia Type 2 diabetes DNR Failure to thrive   Plan:  - Unfortunately her overall condition continued to deteriorated rapidly, she has developed new onset jaundice, not eating food, has very limited oral intake.  She is lethargic. - I recommend residential hospice, her life expectancy is likely less than 2 weeks.  Patient agrees with the plan. - I spoke with her mother and multiple other family members in the room, and spoke with her husband and her son on the phone, they all agree with the plan. -I messaged Dr. Celinda Onita Mattock, MD 01/19/2025  5:58 PM

## 2025-01-19 NOTE — ED Notes (Signed)
 Pt denies needs, provided coffee to visitor.

## 2025-01-19 NOTE — ED Provider Notes (Signed)
 " [facility] Provider Note   CSN: [id number] Arrival date & time: 01/19/25  9476     Patient presents with: Leg Pain   Ashley Clark is a 64 y.o. female.   The history is provided by the patient, the spouse and a relative.  Patient with history of stage IV pancreatic cancer with liver metastasis, hypertension, PE on Eliquis , previous history of breast cancer presents with leg swelling, leg pain and generalized weakness.  She reports she is having difficulty taking p.o. meds at home.  She also reports increased leg swelling and pain.  She is having difficulty walking.  No trauma/falls. She reports she is chronically short of breath. No fevers or vomiting.  No active chest pain.  No abdominal pain.    Past Medical History:  Diagnosis Date   Acute pulmonary embolism (HCC) 10/29/2023   Allergy    Back pain    Cancer (HCC) 2019   left breast cancer-last radiation Dec.2019   Diabetes mellitus without complication [facility])    Medical history non-contributory    Personal history of radiation therapy    Port-A-Cath in place - removed 12-11-2023 due to port-a-cath infection. 11/13/2023   Prediabetes    Tiredness    Vitamin D  deficiency     Prior to Admission medications  Medication Sig Start Date End Date Taking? Authorizing Provider  apixaban  (ELIQUIS ) 5 MG TABS tablet Take 1 tablet (5 mg total) by mouth 2 (two) times daily. 08/31/24   [name], MD  cyanocobalamin (VITAMIN B12) 500 MCG tablet Take 500 mcg by mouth daily.    [provider]  fluconazole  (DIFLUCAN ) 100 MG tablet Take 2 tablets (200 mg total) by mouth daily for 12 days. 01/15/25 01/27/25  [place], Eric J, DO  metoCLOPramide  (REGLAN ) 10 MG tablet Take 1 tablet (10 mg total) by mouth every 8 (eight) hours as needed for nausea. 11/02/24   [name], MD  ondansetron  (ZOFRAN ) 8 MG tablet Take 1 tablet (8 mg total) by mouth every 8 (eight) hours as needed for nausea or  vomiting. 10/26/24   [name], MD  oxyCODONE  (OXY IR/ROXICODONE ) 5 MG immediate release tablet Take 1 tablet (5 mg total) by mouth every 4 (four) hours as needed for moderate pain (pain score 4-6) or severe pain (pain score 7-10). 01/15/25   [place], Camellia PARAS, DO  pantoprazole  (PROTONIX ) 40 MG tablet Take 1 tablet (40 mg total) by mouth daily. 01/15/25 04/15/25  [place], Camellia PARAS, DO  potassium chloride  SA (KLOR-CON  M) 20 MEQ tablet Take 1 tablet (20 mEq total) by mouth 2 (two) times daily. 11/02/24   [name], MD  pregabalin  (LYRICA ) 25 MG capsule Take 1 capsule (25 mg total) by mouth 2 (two) times daily. 01/15/25 04/15/25  [place], Eric J, DO  prochlorperazine  (COMPAZINE ) 10 MG tablet 1 tablet as needed for nausea or vomiting Orally every 6 hours    [provider]  senna (SENOKOT) 8.6 MG TABS tablet Take 2 tablets (17.2 mg total) by mouth 2 (two) times daily. 01/15/25   [place], Camellia PARAS, DO    Allergies: Irinotecan  liposome    Review of Systems  Constitutional:  Negative for fever.  Respiratory:  Positive for shortness of breath.   Cardiovascular:  Positive for leg swelling.    Updated Vital Signs BP 94/78   Pulse (!) 108   Temp (!) 97.4 F (36.3 C) (Axillary)   Resp (!) 25   LMP 03/12/2015  SpO2 100%   Physical Exam CONSTITUTIONAL: Elderly, chronically ill appearing HEAD: Normocephalic/atraumatic EYES: EOMI/PERRL, icterus noted ENMT: Mucous membranes dry NECK: supple no meningeal signs CV: S1/S2 noted, tachycardia LUNGS: Decreased breath sounds bilaterally ABDOMEN: soft, nontender NEURO: Pt is awake/alert/appropriate, moves all extremitiesx4.  No facial droop.   EXTREMITIES: 2+ pitting edema to bilateral lower extremities No deformities.  Patient is able to move both legs  SKIN: warm, color normal  (all labs ordered are listed, but only abnormal results are displayed) Labs Reviewed  CBC  BASIC METABOLIC PANEL WITH GFR    EKG: EKG Interpretation Date/Time:  Thursday  January 19 2025 05:42:01 EST Ventricular Rate:  108 PR Interval:  126 QRS Duration:  85 QT Interval:  356 QTC Calculation: 478 R Axis:   37  Text Interpretation: Sinus tachycardia No significant change since last tracing Confirmed by Midge Golas ([id number]) on 01/19/2025 5:46:34 AM  Radiology: No results found.   Procedures   Medications Ordered in the ED  fentaNYL  (SUBLIMAZE ) injection 50 mcg (has no administration in time range)    Clinical Course as of 01/19/25 0734  Thu Jan 19, 2025  0627 Patient with extensive history including stage IV pancreatic cancer, no longer on chemotherapy presenting with generalized weakness, leg pain and swelling, as well as decreased p.o. Patient was just recently in the hospital. Patient is a DNR We will plan for admission [DW]  0730 Signed out to dr allen at shift change [DW]    Clinical Course User Index [DW] Midge Golas, MD                                 Medical Decision Making Amount and/or Complexity of Data Reviewed Labs: ordered. Radiology: ordered.  Risk Prescription drug management.        Final diagnoses:  Malignant neoplasm of pancreas, unspecified location of malignancy [facility])    ED Discharge Orders     None          Midge Golas, MD 01/19/25 [phone]  "

## 2025-01-19 NOTE — ED Notes (Signed)
 Call to lab,  added hepatic function panel

## 2025-01-19 NOTE — ED Triage Notes (Signed)
 Pt from home c/o pain in left leg and impaired mobility, pt has hx of pancreatic cancer with mets to the liver, stopped all tx on friday. Pt prescribed pain meds at home but does not want to take them because she wants to see how things play out.

## 2025-01-20 DIAGNOSIS — Z86711 Personal history of pulmonary embolism: Secondary | ICD-10-CM | POA: Diagnosis not present

## 2025-01-20 DIAGNOSIS — K7290 Hepatic failure, unspecified without coma: Secondary | ICD-10-CM | POA: Diagnosis not present

## 2025-01-20 DIAGNOSIS — D509 Iron deficiency anemia, unspecified: Secondary | ICD-10-CM

## 2025-01-20 DIAGNOSIS — E1169 Type 2 diabetes mellitus with other specified complication: Secondary | ICD-10-CM

## 2025-01-20 DIAGNOSIS — R1319 Other dysphagia: Secondary | ICD-10-CM

## 2025-01-20 DIAGNOSIS — K7460 Unspecified cirrhosis of liver: Secondary | ICD-10-CM

## 2025-01-20 DIAGNOSIS — Z515 Encounter for palliative care: Secondary | ICD-10-CM

## 2025-01-20 DIAGNOSIS — E1159 Type 2 diabetes mellitus with other circulatory complications: Secondary | ICD-10-CM

## 2025-01-20 DIAGNOSIS — E8729 Other acidosis: Secondary | ICD-10-CM

## 2025-01-20 DIAGNOSIS — D72829 Elevated white blood cell count, unspecified: Secondary | ICD-10-CM

## 2025-01-20 DIAGNOSIS — C259 Malignant neoplasm of pancreas, unspecified: Secondary | ICD-10-CM | POA: Diagnosis not present

## 2025-01-20 DIAGNOSIS — K5900 Constipation, unspecified: Secondary | ICD-10-CM | POA: Diagnosis not present

## 2025-01-20 DIAGNOSIS — C787 Secondary malignant neoplasm of liver and intrahepatic bile duct: Secondary | ICD-10-CM | POA: Diagnosis not present

## 2025-01-20 DIAGNOSIS — N179 Acute kidney failure, unspecified: Secondary | ICD-10-CM | POA: Diagnosis not present

## 2025-01-20 DIAGNOSIS — I152 Hypertension secondary to endocrine disorders: Secondary | ICD-10-CM

## 2025-01-20 DIAGNOSIS — E871 Hypo-osmolality and hyponatremia: Secondary | ICD-10-CM | POA: Diagnosis not present

## 2025-01-20 DIAGNOSIS — E785 Hyperlipidemia, unspecified: Secondary | ICD-10-CM

## 2025-01-20 LAB — CBC
HCT: 22.2 % — ABNORMAL LOW (ref 36.0–46.0)
Hemoglobin: 7.2 g/dL — ABNORMAL LOW (ref 12.0–15.0)
MCH: 27.2 pg (ref 26.0–34.0)
MCHC: 32.4 g/dL (ref 30.0–36.0)
MCV: 83.8 fL (ref 80.0–100.0)
Platelets: 36 K/uL — ABNORMAL LOW (ref 150–400)
RBC: 2.65 MIL/uL — ABNORMAL LOW (ref 3.87–5.11)
RDW: 24.3 % — ABNORMAL HIGH (ref 11.5–15.5)
WBC: 28.9 K/uL — ABNORMAL HIGH (ref 4.0–10.5)
nRBC: 4.7 % — ABNORMAL HIGH (ref 0.0–0.2)

## 2025-01-20 LAB — HEMOGLOBIN A1C
Hgb A1c MFr Bld: 6 % — ABNORMAL HIGH (ref 4.8–5.6)
Mean Plasma Glucose: 125.5 mg/dL

## 2025-01-20 LAB — COMPREHENSIVE METABOLIC PANEL WITH GFR
ALT: 167 U/L — ABNORMAL HIGH (ref 0–44)
AST: 653 U/L — ABNORMAL HIGH (ref 15–41)
Albumin: 2.6 g/dL — ABNORMAL LOW (ref 3.5–5.0)
Alkaline Phosphatase: 326 U/L — ABNORMAL HIGH (ref 38–126)
Anion gap: 21 — ABNORMAL HIGH (ref 5–15)
BUN: 53 mg/dL — ABNORMAL HIGH (ref 8–23)
CO2: 12 mmol/L — ABNORMAL LOW (ref 22–32)
Calcium: 8 mg/dL — ABNORMAL LOW (ref 8.9–10.3)
Chloride: 99 mmol/L (ref 98–111)
Creatinine, Ser: 1.93 mg/dL — ABNORMAL HIGH (ref 0.44–1.00)
GFR, Estimated: 29 mL/min — ABNORMAL LOW
Glucose, Bld: 181 mg/dL — ABNORMAL HIGH (ref 70–99)
Potassium: 4.3 mmol/L (ref 3.5–5.1)
Sodium: 132 mmol/L — ABNORMAL LOW (ref 135–145)
Total Bilirubin: 9.1 mg/dL — ABNORMAL HIGH (ref 0.0–1.2)
Total Protein: 4.5 g/dL — ABNORMAL LOW (ref 6.5–8.1)

## 2025-01-20 LAB — GLUCOSE, CAPILLARY: Glucose-Capillary: 167 mg/dL — ABNORMAL HIGH (ref 70–99)

## 2025-01-20 MED ORDER — HEPARIN SOD (PORK) LOCK FLUSH 100 UNIT/ML IV SOLN
250.0000 [IU] | INTRAVENOUS | Status: AC | PRN
  Administered 2025-01-20: 250 [IU]

## 2025-01-20 MED ORDER — MIDODRINE HCL 5 MG PO TABS
5.0000 mg | ORAL_TABLET | ORAL | Status: DC
  Filled 2025-01-20: qty 1

## 2025-01-20 MED ORDER — GLYCOPYRROLATE 0.2 MG/ML IJ SOLN: 0.2000 mg | INTRAMUSCULAR | Status: DC | PRN

## 2025-01-20 MED ORDER — HYDROMORPHONE HCL 1 MG/ML IJ SOLN
1.0000 mg | INTRAMUSCULAR | Status: DC | PRN
  Administered 2025-01-20: 1 mg via INTRAVENOUS
  Filled 2025-01-20: qty 1

## 2025-01-20 MED ORDER — ALBUMIN HUMAN 25 % IV SOLN
50.0000 g | Freq: Once | INTRAVENOUS | Status: AC
  Administered 2025-01-20: 50 g via INTRAVENOUS
  Filled 2025-01-20: qty 200

## 2025-01-20 MED ORDER — SODIUM CHLORIDE 0.9 % IV BOLUS: 500.0000 mL | Freq: Once | INTRAVENOUS | Status: DC

## 2025-01-20 NOTE — Hospital Course (Signed)
 HPI per Dr. Alm Castor on 01/19/25:  Ashley Clark is a 64 y.o. 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  Her appetite and overall oral intake is decreased.  She denied fever, chills, rhinorrhea, sore throat, wheezing or hemoptysis.  No chest pain, palpitations, diaphoresis, PND, orthopnea or pitting edema of the lower extremities.  No emesis, diarrhea, melena or hematochezia, but has been constipated for 1 to 2 weeks due to poor oral intake.  No flank pain, dysuria, frequency or hematuria.  However, she has been urinating less than usual.  Her urine looks dark, but her total bilirubin is increased.  No polyuria, polydipsia, polyphagia or blurred vision.    Lab work: CBC showed a white count of 36.8, hemoglobin 9.9 g/dL and platelets 43.  BMP shows sodium 129, potassium 4.3, chloride 94 and CO2 12 mmol/L with an anion gap of 23.  Glucose 90, BUN 47, creatinine 1.40 and calcium  8.3 mg/dL.  LFTs showed total protein 4.9 albumin  2.0 g/dL, AST 766, ALT 897 and alkaline phosphatase 429 units/L total bilirubin 6.9 and direct bilirubin 5.3 mg/dL.   Imaging: Portable 1 view chest radiograph showing lower lung volumes with increased elevation of the right hemidiaphragm.   ED course: Initial vital signs were temperature 97.4 F, pulse Otting 10, respiration 20, BP 82/59 mmHg and O2 sat 100% on room air.  The patient received fentanyl  50 mcg IVP x 1.  I added normal saline 500 mL bolus x 2.  **Interim History  Patient's primary oncologist evaluated and recommended the patient be transition to hospice care.   The goals of care discussion were had with the palliative team and she was transition to comfort care.  Recommendation was for her to go to residential hospice and she is accepted.  All medications the patient is comfortable discontinuing all blood work was discontinued as well.  She is at high risk for further decompensation given her current condition.  This time is stable to transition to the residential hospice for end-of-life care.  Assessment and Plan:  AKI (acute kidney injury): Observation/MedSurg. Continue IV fluids while awaiting [facility]  Avoid hypotension if possible; She is S/p Albumin  50 g IVPB x 1 and Midodrine  5 mg p.o. twice daily x 2 doses. -BUN/Cr Trend: Recent Labs  Lab 01/09/25 0342 01/10/25 0048 01/12/25 0405 01/13/25 0333 01/15/25 0316 01/19/25 0653 01/20/25 0639  BUN <5* <5* 6* 7* 14 47* 53*  CREATININE 0.30* 0.36* 0.32* 0.33* 0.43* 1.40* 1.93*  -Avoid Nephrotoxic Medications, Contrast Dyes, Hypotension and Dehydration to Ensure Adequate Renal Perfusion and will need to Renally Adjust Meds -Will NOT Continue to Monitor and Trend Renal Function as she has been transitioned to Comfort Care   High anion gap metabolic acidosis: Multifactorial and likely from AKI, Fasting ketosis, and Decreased liver clearance. -Liberalize oral intake. -Hydrated with D5 NS at 100 mL an hour but will NOT check any more labwork as she has transitioned to Comfort Care   Hyponatremia: Due to poor oral intake Continue IV fluids while awaiting [facility]   Decompensated liver disease [facility]): n the setting of liver mets. Hepatic Fxn Panel Trend:  Recent Labs  Lab 12/27/24  0800 12/27/24 0800 01/07/25 2149 01/09/25 0342 01/10/25 0048 01/19/25 0653 01/20/25 0639  AST 53*  --  49* 37 48* 233* 653*  ALT 29   < > 26 16 18  102* 167*  BILITOT 1.1   < > 1.6* 1.1 1.3* 6.9* 9.1*  ALKPHOS 237*  --  283* 215* 270* 429* 326*   < > = values in this interval not displayed.  -C/w Supportive  care.   Diabetes mellitus (HCC) Type 2:  Will stop checking Blood Sugars as she is Comfort Care Given poor nutritional state, will liberalize diet for now.   Hyperlipidemia associated with type 2 diabetes mellitus (HCC): Will defer treatment given advanced pancreatic cancer.   Hypertension associated with type 2 diabetes mellitus (HCC): Not on antihypertensives after weight loss.   History of pulmonary embolism: Stop Anticoagulation as she is Comfort Measures   Leukocytosis: In the setting of pancreatic cancer and concomitant dehydration from poor oral intake. WBC Trend:  Recent Labs  Lab 01/09/25 0342 01/10/25 0048 01/12/25 0405 01/13/25 0333 01/15/25 0316 01/19/25 0653 01/20/25 0639  WBC 10.9* 12.4* 16.5* 21.6* 22.6* 36.8* 28.9*  -Will not any further Labs   Macrocytic Anemia: Hgb/Hct Trend:  Recent Labs  Lab 01/09/25 0342 01/10/25 0048 01/12/25 0405 01/13/25 0333 01/15/25 0316 01/19/25 0653 01/20/25 0639  HGB 7.8* 7.9* 8.0* 8.5* 8.5* 9.9* 7.2*  HCT 24.4* 24.6* 23.9* 26.3* 26.2* 29.9* 22.2*  MCV 80.5 79.9* 78.4* 79.2* 78.7* 79.3* 83.8  -Will not Check any Further Labwork   Thrombocytopenia:  Recent Labs  Lab 01/09/25 0342 01/10/25 0048 01/12/25 0405 01/13/25 0333 01/15/25 0316 01/19/25 0653 01/20/25 0639  PLT 156 141* 149* 146* 81* 43* 36*  -Will not check any further lab work   Pancreatic Cancer [facility]) With associated Metastasis to liver [facility]): Did not respond to chemotherapy and Palliative care consult requested and she has been transitioned to Comfort Care   Esophageal Dysphagia In the setting of  Esophageal candidiasis (HCC): Continue Diflucan  200 mg p.o. daily.   Constipation Secondary to poor nutrition: As needed stool softeners or MiraLAX .  Hypoalbuminemia: Patient's Albumin  Trend: Recent Labs  Lab 12/27/24 0800 01/07/25 2149 01/09/25 0342 01/10/25 0048 01/19/25 0653 01/20/25 0639  ALBUMIN  3.4* 3.0* 2.4* 2.5* 2.0* 2.6*  -Continue to Monitor  and Trend and repeat CMP in the AM

## 2025-01-20 NOTE — Progress Notes (Signed)
 Patient report was called to [facility], AVS placed in packet for receiving RN. All questions were answered. Pt awaits [facility] for transport.

## 2025-01-20 NOTE — Plan of Care (Signed)
   Problem: Nutritional: Goal: Maintenance of adequate nutrition will improve Outcome: Progressing   Problem: Skin Integrity: Goal: Risk for impaired skin integrity will decrease Outcome: Progressing

## 2025-01-20 NOTE — Consult Note (Signed)
 "                                                                                   Consultation Note Date: 01/20/2025   Patient Name: Ashley Clark  DOB: 11-13-1961  MRN: [id number]  Age / Sex: 64 y.o., female  PCP: [name], PA Referring Physician: Sherrill Alejandro Donovan, DO  Reason for Consultation: Establishing goals of care, Hospice Evaluation, and Terminal Care  HPI/Patient Profile: 64 y.o. female  with past medical history of  metastatic pancreatic cancer admitted on 01/19/2025 with failure to thrive, new onset jaundice, poor oral intake.   Clinical Assessment and Goals of Care:   [name] is a 64 y.o. female with medical history significant of acute pulmonary embolism, seasonal allergies, back pain, left breast cancer, type 2 diabetes, esophageal candidiasis, esophageal dysphagia, obesity, history of PE on apixaban , hyponatremia, pancreatic cancer with metastasis to liver who presented to the emergency department complaints of left lower extremity pain and bilateral lower extremity edema and poor PO intake and worsening functional status.   Patient was seen and evaluated by oncology, currently admitted to hospital medicine.   PMT for further goals of care discussions, terminal care, hospice arrangements.   Palliative medicine is specialized medical care for people living with serious illness. It focuses on providing relief from the symptoms and stress of a serious illness. The goal is to improve quality of life for both the patient and the family. Goals of care: Broad aims of medical therapy in relation to the patient's values and preferences. Our aim is to provide medical care aimed at enabling patients to achieve the goals that matter most to them, given the circumstances of their particular medical situation and their constraints.   Summary: Patient with widely metastatic pancreatic adenocarcinoma with documented disease progression on recent CT imaging, now presenting  with rapid functional and clinical decline following a recent hospitalization for severe dysphagia due to esophageal candidiasis. She was discharged home on 1/18 with minimal ability to swallow, has since had poor oral intake, a fall at home, and now presents with worsening leg pain, lethargy, and new-onset jaundice, consistent with advancing hepatic metastatic burden. Mother present at bedside.   NEXT OF KIN  Mother spouse   SUMMARY OF RECOMMENDATIONS    Failure to Thrive / Advanced Cancer Related Functional Status Decline  The patient demonstrates profound functional decline, minimal oral intake limited to small sips of liquids, increasing lethargy, and inability to safely meet nutritional or hydration needs. This decline is irreversible and directly attributable to progressive metastatic malignancy.  No role for artificial nutrition or escalation of disease-directed therapy given advanced disease, poor performance status, and patient goals.  2. Metastatic Pancreatic Cancer with Disease Progression  Recent imaging confirms cancer progression, now complicated by new-onset jaundice, likely secondary to hepatic metastatic involvement. Overall clinical trajectory is one of rapid decline despite appropriate prior oncologic care.  Prognosis discussed as poor, with life expectancy likely less than two weeks.  3. Symptom Burden (Pain, Dysphagia, Lethargy)  Patient has worsening pain, severe dysphagia, and increasing somnolence, requiring frequent assessment and medication adjustment. Symptoms are not manageable in the home setting  given complexity and rapid progression.  Focus on aggressive comfort-directed symptom management only.  4. Dysphagia Secondary to Esophageal Candidiasis  Persistent inability to swallow despite recent treatment, contributing to dehydration, malnutrition, medication intolerance, and aspiration risk.  Oral intake as tolerated for comfort only.  5. New-Onset  Jaundice  Likely secondary to progressive liver metastases. No role for invasive evaluation or intervention given goals of care.  6. Hyponatremia, Anemia, History of PE on Anticoagulation, Type 2 Diabetes  These chronic and subacute medical issues are no longer targets for correction given terminal trajectory.  Recommend discontinuation of non-comfort-focused labs and interventions.  7. Goals of Care / End-of-Life Planning  Patient is DNR and demonstrates clear understanding of her condition. A goals-of-care discussion was held with the patient and her mother.  End-of-life signs and symptoms were explained, including increasing sleep, decreased appetite and thirst, changes in breathing, and decreased responsiveness.  Patient and mother expressed understanding and agreement with a comfort-focused approach.  8. Hospice Recommendation - General Inpatient (GIP) / Residential Hospice  Given rapid clinical deterioration, uncontrolled symptom burden, inability to tolerate oral medications, and need for frequent nursing and medical reassessment, I recommend residential hospice with GIP-level care for intensive symptom management.  This level of care is necessary to ensure comfort, dignity, and safety at the end of life.  Patient agrees with this plan.  Mother and family are in agreement.  9. Spiritual and Psychosocial Support  Spiritual care was offered to the patient and family to support emotional and existential needs during this transition.  Family encouraged to utilize hospice interdisciplinary support services.  10. Care Coordination  Discussed case and recommendations with the hospital medicine attending, who is in agreement with the hospice plan.  Hospice referral to be initiated urgently. TOC note reviewed. Appreciate help. [facility] liaison to see.   Overall Impression: The patient is experiencing active dying from progressive metastatic pancreatic cancer. Goals are aligned  toward comfort, dignity, and symptom relief, with hospice facility care as the most appropriate and compassionate next step.  Code Status/Advance Care Planning: DNR   Symptom Management:     Palliative Prophylaxis:  Frequent Pain Assessment  Additional Recommendations (Limitations, Scope, Preferences): Full Comfort Care  Psycho-social/Spiritual:  Desire for further Chaplaincy support:yes Additional Recommendations: Education on Hospice  Prognosis:  < 2 weeks  Discharge Planning: Hospice facility      Primary Diagnoses: Present on Admission:  AKI (acute kidney injury)  Decompensated liver disease (HCC)  High anion gap metabolic acidosis  Esophageal candidiasis (HCC)  Esophageal dysphagia  History of pulmonary embolism  Hyponatremia  Hyperlipidemia associated with type 2 diabetes mellitus (HCC)  Hypertension associated with type 2 diabetes mellitus (HCC)  Pancreatic cancer (HCC)  Microcytic anemia  Metastasis to liver (HCC)  Constipation  Cancer associated pain  Leukocytosis  Thrombocytopenia  Failure to thrive in adult   I have reviewed the medical record, interviewed the patient and family, and examined the patient. The following aspects are pertinent.  Past Medical History:  Diagnosis Date   Acute pulmonary embolism (HCC) 10/29/2023   Allergy    Back pain    Cancer (HCC) 2019   left breast cancer-last radiation Dec.2019   Diabetes mellitus without complication [facility])    Medical history non-contributory    Personal history of radiation therapy    Port-A-Cath in place - removed 12-11-2023 due to port-a-cath infection. 11/13/2023   Prediabetes    Tiredness    Vitamin D  deficiency    Social History  Socioeconomic History   Marital status: Married    Spouse name: Not on file   Number of children: Not on file   Years of education: Not on file   Highest education level: Not on file  Occupational History   Not on file  Tobacco Use   Smoking status:  Former    Current packs/day: 0.00    Average packs/day: 0.3 packs/day    Types: Cigarettes    Quit date: 01/2015    Years since quitting: 9.9   Smokeless tobacco: Never   Tobacco comments:    3-4 cigs a day   Vaping Use   Vaping status: Never Used  Substance and Sexual Activity   Alcohol use: Not Currently    Alcohol/week: 4.0 standard drinks of alcohol    Types: 4 Glasses of wine per week   Drug use: No   Sexual activity: Not on file  Other Topics Concern   Not on file  Social History Narrative   Not on file   Social Drivers of Health   Tobacco Use: Medium Risk (01/19/2025)   Patient History    Smoking Tobacco Use: Former    Smokeless Tobacco Use: Never    Passive Exposure: Not on Actuary Strain: Not on file  Food Insecurity: No Food Insecurity (01/20/2025)   Epic    Worried About Programme Researcher, Broadcasting/film/video in the Last Year: Never true    Ran Out of Food in the Last Year: Never true  Transportation Needs: No Transportation Needs (01/20/2025)   Epic    Lack of Transportation (Medical): No    Lack of Transportation (Non-Medical): No  Physical Activity: Not on file  Stress: Not on file  Social Connections: Not on file  Depression (PHQ2-9): Low Risk (12/14/2024)   Depression (PHQ2-9)    PHQ-2 Score: 2  Recent Concern: Depression (PHQ2-9) - Medium Risk (09/28/2024)   Depression (PHQ2-9)    PHQ-2 Score: 5  Alcohol Screen: Not on file  Housing: Low Risk (01/20/2025)   Epic    Unable to Pay for Housing in the Last Year: No    Number of Times Moved in the Last Year: 0    Homeless in the Last Year: No  Utilities: Not At Risk (01/20/2025)   Epic    Threatened with loss of utilities: No  Health Literacy: Not on file   Family History  Problem Relation Age of Onset   Breast cancer Mother 99   Colon polyps Mother    Colon cancer Neg Hx    Rectal cancer Neg Hx    Stomach cancer Neg Hx    Esophageal cancer Neg Hx    Scheduled Meds:  feeding supplement  237 mL  Oral TID BM   fluconazole   200 mg Oral Daily   metoCLOPramide   10 mg Oral QPM   midodrine   5 mg Oral STAT   pantoprazole   40 mg Oral BID   pregabalin   25 mg Oral BID   senna-docusate  1 tablet Oral BID   Continuous Infusions:  albumin  human     PRN Meds:.acetaminophen  **OR** acetaminophen , dextrose , HYDROmorphone  (DILAUDID ) injection, lidocaine , ondansetron  **OR** ondansetron  (ZOFRAN ) IV, oxyCODONE , polyethylene glycol Medications Prior to Admission:  Prior to Admission medications  Medication Sig Start Date End Date Taking? Authorizing Provider  apixaban  (ELIQUIS ) 5 MG TABS tablet Take 1 tablet (5 mg total) by mouth 2 (two) times daily. 08/31/24  Yes [name], MD  fluconazole  (DIFLUCAN ) 100 MG tablet Take 2 tablets (  200 mg total) by mouth daily for 12 days. 01/15/25 01/27/25 Yes [place], Eric J, DO  metoCLOPramide  (REGLAN ) 10 MG tablet Take 1 tablet (10 mg total) by mouth every 8 (eight) hours as needed for nausea. Patient taking differently: Take 10 mg by mouth every evening. 11/02/24  Yes [name], MD  ondansetron  (ZOFRAN ) 8 MG tablet Take 1 tablet (8 mg total) by mouth every 8 (eight) hours as needed for nausea or vomiting. 10/26/24  Yes [name], MD  oxyCODONE  (OXY IR/ROXICODONE ) 5 MG immediate release tablet Take 1 tablet (5 mg total) by mouth every 4 (four) hours as needed for moderate pain (pain score 4-6) or severe pain (pain score 7-10). 01/15/25  Yes [place], Eric J, DO  pantoprazole  (PROTONIX ) 40 MG tablet Take 1 tablet (40 mg total) by mouth daily. Patient taking differently: Take 40 mg by mouth 2 (two) times daily. 01/15/25 04/15/25 Yes [place], Eric J, DO  pregabalin  (LYRICA ) 25 MG capsule Take 1 capsule (25 mg total) by mouth 2 (two) times daily. 01/15/25 04/15/25 Yes [place], Eric J, DO  cyanocobalamin (VITAMIN B12) 500 MCG tablet Take 500 mcg by mouth daily. Patient not taking: Reported on 01/19/2025    [provider]  potassium chloride  SA (KLOR-CON  M) 20 MEQ tablet  Take 1 tablet (20 mEq total) by mouth 2 (two) times daily. Patient not taking: Reported on 01/19/2025 11/02/24   [name], MD  senna (SENOKOT) 8.6 MG TABS tablet Take 2 tablets (17.2 mg total) by mouth 2 (two) times daily. Patient not taking: Reported on 01/19/2025 01/15/25   [place], Eric J, DO   Allergies[1] Review of Systems +weakness  Physical Exam Icteric Look of fear on face, anxious Generalized pain Abdomen not distended  Vital Signs: BP 99/63 (BP Location: Right Arm)   Pulse (!) 103   Temp 98 F (36.7 C) (Oral)   Resp 18   Ht 5' 4 (1.626 m)   Wt 74.2 kg   LMP 03/12/2015   SpO2 98%   BMI 28.08 kg/m  Pain Scale: 0-10   Pain Score: 0-No pain   SpO2: SpO2: 98 % O2 Device:SpO2: 98 % O2 Flow Rate: .   IO: Intake/output summary:  Intake/Output Summary (Last 24 hours) at 01/20/2025 1135 Last data filed at 01/20/2025 0900 Gross per 24 hour  Intake 4328.61 ml  Output 0 ml  Net 4328.61 ml    LBM: Last BM Date : 01/13/25 Baseline Weight: Weight: 74.2 kg Most recent weight: Weight: 74.2 kg     Palliative Assessment/Data:   PPS 30%  Time In:  10 Time Out:  11.15 Time Total:  75 Greater than 50%  of this time was spent counseling and coordinating care related to the above assessment and plan.  Signed by: Lonia Serve, MD   Please contact Palliative Medicine Team phone at [phone] for questions and concerns.  For individual provider: See Amion                 [1]  Allergies Allergen Reactions   Irinotecan  Liposome Shortness Of Breath and Other (See Comments)    (Onivyde , given via IV) Pt had hypersensitivity rxn on 11/11/2023, see hypersensitivity rxn note for details. Pt had s/s of back pain, abdominal discomfort, SOB, flushing, and hypotension.    "

## 2025-01-20 NOTE — Discharge Summary (Signed)
 " Physician Discharge Summary   Patient: Ashley Clark MRN: [id number] DOB: 20-Nov-1961  Admit date:     01/19/2025  Discharge date: 01/20/25  Discharge Physician: Alejandro Marker, DO   PCP: [name], PA   Recommendations at discharge:   Further care per Hospice protocol  Discharge Diagnoses: Principal Problem:   AKI (acute kidney injury) Active Problems:   Pancreatic cancer (HCC)   Diabetes mellitus (HCC)   History of pulmonary embolism   Metastasis to liver (HCC)   Hyperlipidemia associated with type 2 diabetes mellitus (HCC)   Hypertension associated with type 2 diabetes mellitus (HCC)   Decompensated liver disease (HCC)   Hyponatremia   Microcytic anemia   Constipation   Cancer associated pain   Esophageal dysphagia   Esophageal candidiasis (HCC)   High anion gap metabolic acidosis   Leukocytosis   Thrombocytopenia   Failure to thrive in adult   Obstructive hyperbilirubinemia (HCC)  Resolved Problems:   * No resolved hospital problems. [facility] Course: HPI per Dr. Alm Castor on 01/19/25:  Ashley Clark is a 64 y.o. 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  Her appetite and overall oral intake is decreased.  She denied fever, chills, rhinorrhea, sore throat, wheezing or hemoptysis.  No chest pain, palpitations, diaphoresis, PND, orthopnea or pitting edema of the lower extremities.  No emesis, diarrhea, melena or hematochezia, but has been constipated for 1 to 2 weeks due to poor oral intake.  No flank pain, dysuria, frequency or hematuria.  However, she has  been urinating less than usual.  Her urine looks dark, but her total bilirubin is increased.  No polyuria, polydipsia, polyphagia or blurred vision.    Lab work: CBC showed a white count of 36.8, hemoglobin 9.9 g/dL and platelets 43.  BMP shows sodium 129, potassium 4.3, chloride 94 and CO2 12 mmol/L with an anion gap of 23.  Glucose 90, BUN 47, creatinine 1.40 and calcium  8.3 mg/dL.  LFTs showed total protein 4.9 albumin  2.0 g/dL, AST 766, ALT 897 and alkaline phosphatase 429 units/L total bilirubin 6.9 and direct bilirubin 5.3 mg/dL.   Imaging: Portable 1 view chest radiograph showing lower lung volumes with increased elevation of the right hemidiaphragm.   ED course: Initial vital signs were temperature 97.4 F, pulse Otting 10, respiration 20, BP 82/59 mmHg and O2 sat 100% on room air.  The patient received fentanyl  50 mcg IVP x 1.  I added normal saline 500 mL bolus x 2.  **Interim History  Patient's primary oncologist evaluated and recommended the patient be transition to hospice care.  The goals of care discussion were had with the palliative team and she was transition to comfort care.  Recommendation was for her to go to residential hospice and she is accepted.  All medications the patient is comfortable discontinuing all blood work was discontinued as well.  She is at high risk for further decompensation given her current condition.  This time is stable to transition to the residential hospice for end-of-life care.  Assessment and Plan:  AKI (acute kidney injury): Observation/MedSurg. Continue IV fluids while awaiting [facility]  Avoid hypotension if possible; She is  S/p Albumin  50 g IVPB x 1 and Midodrine  5 mg p.o. twice daily x 2 doses. -BUN/Cr Trend: Recent Labs  Lab 01/09/25 0342 01/10/25 0048 01/12/25 0405 01/13/25 0333 01/15/25 0316 01/19/25 0653 01/20/25 0639  BUN <5* <5* 6* 7* 14 47* 53*  CREATININE 0.30* 0.36* 0.32* 0.33* 0.43* 1.40* 1.93*  -Avoid Nephrotoxic  Medications, Contrast Dyes, Hypotension and Dehydration to Ensure Adequate Renal Perfusion and will need to Renally Adjust Meds -Will NOT Continue to Monitor and Trend Renal Function as she has been transitioned to Comfort Care   High anion gap metabolic acidosis: Multifactorial and likely from AKI, Fasting ketosis, and Decreased liver clearance. -Liberalize oral intake. -Hydrated with D5 NS at 100 mL an hour but will NOT check any more labwork as she has transitioned to Comfort Care   Hyponatremia: Due to poor oral intake Continue IV fluids while awaiting [facility]   Decompensated liver disease [facility]): n the setting of liver mets. Hepatic Fxn Panel Trend:  Recent Labs  Lab 12/27/24 0800 12/27/24 0800 01/07/25 2149 01/09/25 0342 01/10/25 0048 01/19/25 0653 01/20/25 0639  AST 53*  --  49* 37 48* 233* 653*  ALT 29   < > 26 16 18  102* 167*  BILITOT 1.1   < > 1.6* 1.1 1.3* 6.9* 9.1*  ALKPHOS 237*  --  283* 215* 270* 429* 326*   < > = values in this interval not displayed.  -C/w Supportive care.   Diabetes mellitus (HCC) Type 2:  Will stop checking Blood Sugars as she is Comfort Care Given poor nutritional state, will liberalize diet for now.   Hyperlipidemia associated with type 2 diabetes mellitus (HCC): Will defer treatment given advanced pancreatic cancer.   Hypertension associated with type 2 diabetes mellitus (HCC): Not on antihypertensives after weight loss.   History of pulmonary embolism: Stop Anticoagulation as she is Comfort Measures   Leukocytosis: In the setting of pancreatic cancer and concomitant dehydration from poor oral intake. WBC Trend:  Recent Labs  Lab 01/09/25 0342 01/10/25 0048 01/12/25 0405 01/13/25 0333 01/15/25 0316 01/19/25 0653 01/20/25 0639  WBC 10.9* 12.4* 16.5* 21.6* 22.6* 36.8* 28.9*  -Will not any further Labs   Macrocytic Anemia: Hgb/Hct Trend:  Recent Labs  Lab 01/09/25 0342 01/10/25 0048 01/12/25 0405 01/13/25 0333 01/15/25 0316  01/19/25 0653 01/20/25 0639  HGB 7.8* 7.9* 8.0* 8.5* 8.5* 9.9* 7.2*  HCT 24.4* 24.6* 23.9* 26.3* 26.2* 29.9* 22.2*  MCV 80.5 79.9* 78.4* 79.2* 78.7* 79.3* 83.8  -Will not Check any Further Labwork   Thrombocytopenia:  Recent Labs  Lab 01/09/25 0342 01/10/25 0048 01/12/25 0405 01/13/25 0333 01/15/25 0316 01/19/25 0653 01/20/25 0639  PLT 156 141* 149* 146* 81* 43* 36*  -Will not check any further lab work   Pancreatic Cancer [facility]) With associated Metastasis to liver [facility]): Did not respond to chemotherapy and Palliative care consult requested and she has been transitioned to Comfort Care   Esophageal Dysphagia In the setting of  Esophageal candidiasis (HCC): Continue Diflucan  200 mg p.o. daily.   Constipation Secondary to poor nutrition: As needed stool softeners or MiraLAX .  Hypoalbuminemia: Patient's Albumin  Trend: Recent Labs  Lab 12/27/24 0800 01/07/25 2149 01/09/25 0342 01/10/25 0048 01/19/25 0653 01/20/25 0639  ALBUMIN  3.4* 3.0* 2.4* 2.5* 2.0* 2.6*  -Continue to Monitor and Trend and repeat CMP in the AM  Consultants: Medical Oncology; Palliative Care Medicine Procedures performed: None   Disposition: Residential Hospice  Diet recommendation:  Regular diet DISCHARGE MEDICATION: Allergies as of  01/20/2025       Reactions   Irinotecan  Liposome Shortness Of Breath, Other (See Comments)   (Onivyde , given via IV) Pt had hypersensitivity rxn on 11/11/2023, see hypersensitivity rxn note for details. Pt had s/s of back pain, abdominal discomfort, SOB, flushing, and hypotension.         Medication List     STOP taking these medications    cyanocobalamin 500 MCG tablet Commonly known as: VITAMIN B12   Eliquis  5 MG Tabs tablet Generic drug: apixaban    fluconazole  100 MG tablet Commonly known as: Diflucan    metoCLOPramide  10 MG tablet Commonly known as: REGLAN    ondansetron  8 MG tablet Commonly known as: ZOFRAN    oxyCODONE  5 MG immediate release  tablet Commonly known as: Oxy IR/ROXICODONE    pantoprazole  40 MG tablet Commonly known as: Protonix    potassium chloride  SA 20 MEQ tablet Commonly known as: KLOR-CON  M   pregabalin  25 MG capsule Commonly known as: LYRICA    senna 8.6 MG Tabs tablet Commonly known as: SENOKOT       Discharge Exam: Filed Weights   01/19/25 1225  Weight: 74.2 kg   Vitals:   01/20/25 0803 01/20/25 1224  BP: 99/63 (!) 83/52  Pulse: (!) 103 (!) 106  Resp: 18 18  Temp: 98 F (36.7 C) 97.7 F (36.5 C)  SpO2: 98% 100%   Examination: Physical Exam:  Constitutional: Overweight chronically ill-appearing African-American female who appears older than her stated age who appears a little uncomfortable Respiratory: Diminished to auscultation bilaterally, no wheezing, rales, rhonchi or crackles. Normal respiratory effort and patient is not tachypenic. No accessory muscle use.  Unlabored breathing Cardiovascular: Tachycardic rate but regular rhythm, no murmurs / rubs / gallops. S1 and S2 auscultated. Has significant Pitting edema Abdomen: Soft, has some tenderness, distended secondary body habitus bowel sounds positive.  GU: Deferred. Musculoskeletal: No clubbing / cyanosis of digits/nails. No joint deformity upper and lower extremities.  Skin: No rashes, lesions, ulcers on limited skin evaluation but she is jaundiced. No induration; Warm and dry.  Neurologic: CN 2-12 grossly intact with no focal deficits. Romberg sign and cerebellar reflexes not assessed.  Psychiatric: She is awake and alert  Condition at discharge: Guarded and Poor  The results of significant diagnostics from this hospitalization (including imaging, microbiology, ancillary and laboratory) are listed below for reference.   Imaging Studies: DG Chest Portable 1 View Result Date: 01/19/2025 EXAM: 1 VIEW XRAY OF THE CHEST 01/19/2025 06:33:00 AM COMPARISON: Prior study from December 2025. CLINICAL HISTORY: 64 year old female with  shortness of breath and metastatic pancreatic cancer. FINDINGS: LUNGS AND PLEURA: Lower lung volumes. Elevation of the right hemidiaphragm has progressed. Extensive liver metastases demonstrated last month. No focal pulmonary opacity. No pleural effusion. No pneumothorax. HEART AND MEDIASTINUM: No acute abnormality of the cardiac and mediastinal silhouettes. BONES AND SOFT TISSUES: No acute osseous abnormality. IMPRESSION: 1. Lower lung volumes with increased elevation of the right hemidiaphragm. Electronically signed by: Helayne Hurst MD 01/19/2025 06:36 AM EST RP Workstation: [id number]   DG UGI W SMALL BOWEL Result Date: 01/12/2025 CLINICAL DATA:  Decreased appetite, weight loss, nausea, vomiting, malnutrition worsening over 1 year. Concern for possible intestinal obstruction. Request for upper GI series with small-bowel follow-through for further evaluation. Widespread metastatic pancreatic cancer. EXAM: UPPER GI SERIES WITH SMALL BOWEL FOLLOW-THROUGH FLUOROSCOPY: Radiation Exposure Index (as provided by the fluoroscopic device): 2.00 hypoxic mGy Kerma TECHNIQUE: Combined double contrast and single contrast upper GI series using thin barium. Subsequently, serial images of the  small bowel were obtained including spot views of the terminal ileum. This exam was performed by Kimble Clas, PA-C, and was supervised and interpreted by Dr. MARLA Kilts. COMPARISON:  12/21/2024 CTs FINDINGS: Esophagus:  Tiny Zenker's diverticulum. Esophageal motility:  Within normal limits. Gastroesophageal reflux:  None visualized. Ingested 13 mm barium tablet: Became stuck in the upper thoracic esophagus, despite multiple sips of water and thin barium. No focal narrowing or obvious stricture seen. Stomach: Normal appearance. No hiatal hernia. Gastric emptying: Normal. Duodenum:  Normal appearance. Other: Contrast seen opacifying the proximal ascending colon. No evidence of obstruction. Normal small bowel transit. No significant  small-bowel narrowing. IMPRESSION: No evidence of small-bowel obstruction or other explanation for patient's symptoms. 13 mm barium tablet has delayed passage in the upper esophagus without cause identified. Electronically Signed   By: Rockey Kilts M.D.   On: 01/12/2025 11:05   DG Fluoro Rm 1-60 Min - No Report Result Date: 01/09/2025 Fluoroscopy was utilized by the requesting physician.  No radiographic interpretation.   DG Fluoro Rm 1-60 Min - No Report Result Date: 01/09/2025 Fluoroscopy was utilized by the requesting physician.  No radiographic interpretation.   DG Fluoro Rm 1-60 Min - No Report Result Date: 01/09/2025 Fluoroscopy was utilized by the requesting physician.  No radiographic interpretation.   DG Fluoro Rm 1-60 Min - No Report Result Date: 01/09/2025 Fluoroscopy was utilized by the requesting physician.  No radiographic interpretation.   DG Fluoro Rm 1-60 Min - No Report Result Date: 01/09/2025 Fluoroscopy was utilized by the requesting physician.  No radiographic interpretation.   DG Fluoro Rm 1-60 Min - No Report Result Date: 01/09/2025 Fluoroscopy was utilized by the requesting physician.  No radiographic interpretation.   DG Fluoro Rm 1-60 Min - No Report Result Date: 01/09/2025 Fluoroscopy was utilized by the requesting physician.  No radiographic interpretation.   CT CHEST ABDOMEN PELVIS W CONTRAST Result Date: 01/06/2025 EXAM: CT CHEST, ABDOMEN AND PELVIS WITH CONTRAST 12/21/2024 01:44:35 PM TECHNIQUE: CT of the chest, abdomen and pelvis was performed with the administration of 100 mL of iohexol  (OMNIPAQUE ) 300 MG/ML solution. Multiplanar reformatted images are provided for review. Automated exposure control, iterative reconstruction, and/or weight based adjustment of the mA/kV was utilized to reduce the radiation dose to as low as reasonably achievable. COMPARISON: CT chest, abdomen and pelvis 10/19/2024. CLINICAL HISTORY: Pancreatic cancer, assess treatment response.  * Tracking Code: BO * FINDINGS: CHEST: MEDIASTINUM AND LYMPH NODES: Left PICC terminates at the cavoatrial junction. Heart and pericardium are unremarkable. The central airways are clear. No mediastinal, hilar or axillary lymphadenopathy. LUNGS AND PLEURA: Tiny solid 0.3 cm posterior left lower lobe nodule on series 10, image 66 is new. Tiny perifissural 0.3 cm posterior right upper lobe nodule along the major fissure on image 40 is stable. No acute consolidative airspace disease. No additional significant pulmonary nodules. No pleural effusion. No pneumothorax. ABDOMEN AND PELVIS: LIVER: Numerous (greater than 15) hypoenhancing bulky liver masses scattered throughout the liver, increased in size and number. Representative dominant 9.8 x 6.1 cm left liver mass on series 7, image 104, increased from 4.9 x 3.6 cm. Representative new 4.0 x 3.9 cm superior right liver mass on image 73. Representative new 1.3 x 1.1 cm inferior right liver mass on image 106. GALLBLADDER AND BILE DUCTS: Unremarkable. No biliary ductal dilatation. SPLEEN: No acute abnormality. PANCREAS: Hypoenhancing pancreatic body 1.5 x 1.2 cm mass on image 109 is stable. Stable atrophy and mild ductal dilation in the pancreatic tail. ADRENAL  GLANDS: No acute abnormality. KIDNEYS, URETERS AND BLADDER: No stones in the kidneys or ureters. No hydronephrosis. No perinephric or periureteral stranding. Urinary bladder is unremarkable. GI AND BOWEL: Stomach demonstrates no acute abnormality. Normal caliber small bowel. Normal appendix. No small or large bowel wall thickening. No significant colonic diverticulosis. REPRODUCTIVE ORGANS: No acute abnormality. PERITONEUM AND RETROPERITONEUM: Small volume pelvic ascites is new. No free air. VASCULATURE: Dilated main pulmonary artery (3.6 cm diameter). Normal course and caliber of the thoracic aorta. Abdominal aorta is normal in caliber. 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 ABDOMINAL AND PELVIS LYMPH NODES: Progressive retroperitoneal adenopathy. Representative newly enlarged 1.0 cm porta hepatis node on image 87, newly enlarged 1.4 cm portacaval node on image 105, newly enlarged left paraaortic nodes up to 1.0 cm on image 122 and newly enlarged left common iliac nodes up to 1.0 cm on image 131. BONES AND SOFT TISSUES: Mild thoracic spondylosis. Minimal lumbar spondylosis. No acute osseous abnormality. No focal soft tissue abnormality. IMPRESSION: 1. Progression of metastatic disease with significantly increased size and number of liver metastases. 2. Progressive retroperitoneal and left common iliac metastatic adenopathy. 3. New tiny 3 mm solid left lower lobe pulmonary nodule suspicious for metastasis. 4. New small volume pelvic ascites. 5. Stable 1.5 cm pancreatic body mass with stable pancreatic tail atrophy and mild pancreatic ductal dilatation. 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 6. Dilated main pulmonary artery, which can be seen with pulmonary hypertension. Electronically signed by: Selinda Blue MD MD 01/06/2025 09:36 AM EST RP Workstation: [id number]   Microbiology: Results for orders placed or performed during the hospital encounter of 01/07/25  Culture, blood (Routine x 2)     Status: None   Collection Time: 01/07/25 10:26 PM   Specimen: Left Antecubital; Blood  Result Value Ref Range Status   Specimen Description   Final    LEFT ANTECUBITAL Performed at [facility], [address] [place], [place] [address]    Special Requests   Final    BOTTLES DRAWN AEROBIC AND ANAEROBIC Blood Culture adequate volume Performed at [facility], [address], [place], [place] [address]    Culture   Final    NO GROWTH 5 DAYS Performed at [facility], [address], [place], [place] [address]    Report Status 01/12/2025 FINAL  Final  Culture, blood (Routine x 2)     Status:  None   Collection Time: 01/07/25 10:38 PM   Specimen: Right Antecubital; Blood  Result Value Ref Range Status   Specimen Description   Final    RIGHT ANTECUBITAL Performed at [facility], [address], [place], [place] [address]    Special Requests   Final    BOTTLES DRAWN AEROBIC AND ANAEROBIC Blood Culture results may not be optimal due to an inadequate volume of blood received in culture bottles Performed at [facility], [address], [place], [place] [address]    Culture   Final    NO GROWTH 5 DAYS Performed at [facility], [address] [place], [place] [address]    Report Status 01/12/2025 FINAL  Final  Urine Culture     Status: Abnormal   Collection Time: 01/08/25 12:49 AM   Specimen: Urine, Random  Result Value Ref Range Status   Specimen Description   Final    URINE, RANDOM Performed at [facility], [address] [place], [place] [address]    Special Requests   Final    NONE Reflexed from [id number] Performed at [facility], [address] [place], [place] [address]    Culture MULTIPLE SPECIES PRESENT, SUGGEST RECOLLECTION (A)  Final   Report Status 01/09/2025 FINAL  Final   Labs: CBC: Recent Labs  Lab 01/15/25 0316 01/19/25 0653 01/20/25 0639  WBC 22.6* 36.8* 28.9*  HGB 8.5* 9.9* 7.2*  HCT 26.2* 29.9* 22.2*  MCV 78.7* 79.3* 83.8  PLT 81* 43* 36*   Basic Metabolic Panel: Recent Labs  Lab 01/15/25 0316 01/19/25 0653 01/20/25 0639  NA 133* 129* 132*  K 3.6 4.3 4.3  CL 102 94* 99  CO2 20* 12* 12*  GLUCOSE 143* 90 181*  BUN 14 47* 53*  CREATININE 0.43* 1.40* 1.93*  CALCIUM  8.2* 8.3* 8.0*  MG 1.9  --   --    Liver Function Tests: Recent Labs  Lab 01/19/25 0653 01/20/25 0639  AST 233* 653*  ALT 102* 167*  ALKPHOS 429* 326*  BILITOT 6.9* 9.1*  PROT 4.9* 4.5*  ALBUMIN  2.0* 2.6*   CBG: Recent Labs  Lab 01/19/25 1229 01/19/25 1631 01/20/25 0739   GLUCAP 79 112* 167*   Discharge time spent: greater than 30 minutes.  Signed: Alejandro Marker, DO [facility] 01/20/2025 "

## 2025-01-20 NOTE — TOC Transition Note (Signed)
 Transition of Care [facility]) - Discharge Note   Patient Details  Name: Ashley Clark MRN: [id number] Date of Birth: [birth year]  [facility]) CM/SW Contact:  Sonda Manuella Quill, RN Phone Number: 01/20/2025, 1:30 PM   Clinical Narrative:    D/C orders received; pt approved for [facility]; per Hospital Liaison Amy Guerette call report # [phone], room # given on arrival to facility; pt's husband [name] at bedside agreed to d/c plan; [facility] called for transport at 1324; spoke w/ Parne; no IP CM needs.   Final next level of care: Hospice Medical Facility Barriers to Discharge: No Barriers Identified   Patient Goals and CMS Choice Patient states their goals for this hospitalization and ongoing recovery are:: [facility]          Discharge Placement                  Name of family member notified: Xavier Fournier (spouse)    Discharge Plan and Services Additional resources added to the After Visit Summary for     Discharge Planning Services: CM Consult            DME Arranged: N/A DME Agency: NA       HH Arranged: NA HH Agency: NA        Social Drivers of Health (SDOH) Interventions SDOH Screenings   Food Insecurity: No Food Insecurity (01/20/2025)  Housing: Low Risk (01/20/2025)  Transportation Needs: No Transportation Needs (01/20/2025)  Utilities: Not At Risk (01/20/2025)  Depression (PHQ2-9): Low Risk (12/14/2024)  Recent Concern: Depression (PHQ2-9) - Medium Risk (09/28/2024)  Tobacco Use: Medium Risk (01/19/2025)     Readmission Risk Interventions    01/12/2025   11:41 AM  Readmission Risk Prevention Plan  Transportation Screening Complete  PCP or Specialist Appt within 3-5 Days Complete  HRI or Home Care Consult Complete  Social Work Consult for Recovery Care Planning/Counseling Complete  Palliative Care Screening Complete  Medication Review Oceanographer) Complete

## 2025-01-20 NOTE — TOC Initial Note (Signed)
 Transition of Care [facility]) - Initial/Assessment Note    Patient Details  Name: Ashley Clark MRN: [id number] Date of Birth: 1961/02/14  Transition of Care [facility] CM/SW Contact:    Sonda Manuella Quill, RN Phone Number: 01/20/2025, 11:14 AM  Clinical Narrative:                 Acknowledge consult for residential hospice; spoke w/ pt and mother Orlean Christians in room; pt lives at home w/ her spouse/POC Ninoshka Wainwright [phone]); they plan for her to d/c to residential hospice; transport by ambulance; insurance/PCP verified; pt denied SDOH risks; she does not have DME. HH services, or home oxygen; pt's mother given resource for residential hospice; she selected [facility]; Amy Guerette, Hospital Liaison notified via secure chat; she will follow up w/ pt/family; IP CM is following.  Expected Discharge Plan: Hospice Medical Facility Barriers to Discharge: Continued Medical Work up   Patient Goals and CMS Choice Patient states their goals for this hospitalization and ongoing recovery are:: Psychologist, Sport And Exercise          Expected Discharge Plan and Services   Discharge Planning Services: CM Consult   Living arrangements for the past 2 months: Single Family Home                 DME Arranged: N/A DME Agency: NA       HH Arranged: NA HH Agency: NA        Prior Living Arrangements/Services Living arrangements for the past 2 months: Single Family Home Lives with:: Spouse Patient language and need for interpreter reviewed:: Yes Do you feel safe going back to the place where you live?: Yes        Care giver support system in place?: Yes (comment) Current home services:  (n/a) Criminal Activity/Legal Involvement Pertinent to Current Situation/Hospitalization: No - Comment as needed  Activities of Daily Living   ADL Screening (condition at time of admission) Independently performs ADLs?: Yes (appropriate for developmental age) Is the patient deaf or have difficulty hearing?:  No Does the patient have difficulty seeing, even when wearing glasses/contacts?: No Does the patient have difficulty concentrating, remembering, or making decisions?: No  Permission Sought/Granted Permission sought to share information with : Case Manager Permission granted to share information with : Yes, Verbal Permission Granted  Share Information with NAME: Case Manager     Permission granted to share info w Relationship: Allisson Schindel (spouse) [phone]     Emotional Assessment Appearance:: Appears stated age Attitude/Demeanor/Rapport: Gracious Affect (typically observed): Accepting Orientation: : Oriented to Self, Oriented to Place, Oriented to  Time, Oriented to Situation Alcohol / Substance Use: Not Applicable Psych Involvement: No (comment)  Admission diagnosis:  AKI (acute kidney injury) [N17.9] Malignant neoplasm of pancreas, unspecified location of malignancy (HCC) [C25.9] Patient Active Problem List   Diagnosis Date Noted   AKI (acute kidney injury) 01/19/2025   High anion gap metabolic acidosis 01/19/2025   Leukocytosis 01/19/2025   Thrombocytopenia 01/19/2025   Failure to thrive in adult 01/19/2025   Obstructive hyperbilirubinemia (HCC) 01/19/2025   Stricture and stenosis of esophagus 01/13/2025   Dysphagia 01/12/2025   Esophageal candidiasis (HCC) 01/12/2025   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   Goals of care, counseling/discussion 01/11/2025   Esophageal dysphagia 01/11/2025   Abnormal esophagram 01/11/2025   Decreased oral intake 01/10/2025   Constipation 01/10/2025   Cancer associated pain 01/10/2025   Counseling and coordination of care 01/10/2025   Medication management 01/10/2025   Urinary tract infection 01/08/2025  Protein-calorie malnutrition, severe 01/08/2025   Decompensated liver disease (HCC) 01/08/2025   Hyponatremia 01/08/2025   Microcytic anemia 01/08/2025   Palliative care by specialist 01/08/2025   Bacteremia due to  Staphylococcus 12/13/2023   History of pulmonary embolism 12/12/2023   Metastasis to liver (HCC) 12/12/2023   Pancytopenia, acquired (HCC) 12/11/2023   Infected venous access port, initial encounter 12/11/2023   Genetic testing 11/30/2023   Pancreatic cancer (HCC) 11/03/2023   Pancreatic mass 10/29/2023   Liver lesion 10/29/2023   Obesity (BMI 30-39.9) 10/29/2023   At risk for side effect of medication 09/04/2022   Hypertension associated with type 2 diabetes mellitus (HCC) 07/20/2022   Diabetes mellitus (HCC) 06/25/2022   Hyperlipidemia associated with type 2 diabetes mellitus (HCC) 06/25/2022   Vitamin D  deficiency 06/25/2022   At risk for heart disease 06/25/2022   Malignant neoplasm of overlapping sites of left breast in female, estrogen receptor positive (HCC) 08/18/2018   PCP:  [name], PA Pharmacy:   [facility] [address] [place] [address] Phone: [phone] Fax: [phone]     Social Drivers of Health (SDOH) Social History: SDOH Screenings   Food Insecurity: No Food Insecurity (01/20/2025)  Housing: Low Risk (01/20/2025)  Transportation Needs: No Transportation Needs (01/20/2025)  Utilities: Not At Risk (01/20/2025)  Depression (PHQ2-9): Low Risk (12/14/2024)  Recent Concern: Depression (PHQ2-9) - Medium Risk (09/28/2024)  Tobacco Use: Medium Risk (01/19/2025)   SDOH Interventions: Food Insecurity Interventions: Intervention Not Indicated, Inpatient TOC Housing Interventions: Intervention Not Indicated, Inpatient TOC Transportation Interventions: Intervention Not Indicated, Inpatient TOC Utilities Interventions: Intervention Not Indicated, Inpatient TOC   Readmission Risk Interventions    01/12/2025   11:41 AM  Readmission Risk Prevention Plan  Transportation Screening Complete  PCP or Specialist Appt within 3-5 Days Complete  HRI or Home Care Consult Complete  Social Work Consult for Recovery Care  Planning/Counseling Complete  Palliative Care Screening Complete  Medication Review Oceanographer) Complete

## 2025-01-20 NOTE — Progress Notes (Signed)
 Ashley Clark [address]  Hospice hospital liaison note   Referral received from [facility] for family interest in [facility].   Met with patient and family in room to explain services and hospice philosophy and all questions answered. Called and spoke with her husband Jerona as well.  [facility] is able to accept patient this afternoon once consents are complete. Jerona will be going to [employer] [facility] to sign them in person shortly.   RN staff, you may call report at any time to [phone],room is assigned when report is called.  Please leave IV intact and send completed DNR with patient.   Updated attending and [facility] manager via [employer].  Thank you for the opportunity to participate in this patient's care  Amy Darien BSN, RN [facility] Liaison [phone]

## 2025-01-23 NOTE — Addendum Note (Signed)
 Addendum  created 01/23/25 1823 by Tilford Franky BIRCH, MD   Intraprocedure Staff edited

## 2025-01-23 NOTE — Addendum Note (Signed)
 Addendum  created 01/23/25 1827 by Tilford Franky BIRCH, MD   Intraprocedure Staff edited

## 2025-01-24 ENCOUNTER — Other Ambulatory Visit (HOSPITAL_BASED_OUTPATIENT_CLINIC_OR_DEPARTMENT_OTHER): Payer: Self-pay

## 2025-01-25 ENCOUNTER — Inpatient Hospital Stay

## 2025-01-25 ENCOUNTER — Other Ambulatory Visit: Payer: Self-pay

## 2025-01-25 ENCOUNTER — Other Ambulatory Visit (HOSPITAL_BASED_OUTPATIENT_CLINIC_OR_DEPARTMENT_OTHER): Payer: Self-pay

## 2025-01-25 ENCOUNTER — Inpatient Hospital Stay: Admitting: Hematology

## 2025-01-26 ENCOUNTER — Other Ambulatory Visit: Payer: Self-pay

## 2025-01-29 DEATH — deceased
